# Patient Record
Sex: Male | Born: 1940 | Race: Black or African American | Hispanic: No | Marital: Married | Smoking: Current every day smoker
Health system: Southern US, Community
[De-identification: ages and names within clinical notes are randomized; demographics above are authoritative.]

## PROBLEM LIST (undated history)

## (undated) DIAGNOSIS — I10 Essential (primary) hypertension: Secondary | ICD-10-CM

## (undated) DIAGNOSIS — E854 Organ-limited amyloidosis: Secondary | ICD-10-CM

## (undated) DIAGNOSIS — Z720 Tobacco use: Secondary | ICD-10-CM

## (undated) DIAGNOSIS — N08 Glomerular disorders in diseases classified elsewhere: Secondary | ICD-10-CM

## (undated) DIAGNOSIS — K08109 Complete loss of teeth, unspecified cause, unspecified class: Secondary | ICD-10-CM

## (undated) DIAGNOSIS — I861 Scrotal varices: Secondary | ICD-10-CM

## (undated) DIAGNOSIS — N189 Chronic kidney disease, unspecified: Secondary | ICD-10-CM

## (undated) DIAGNOSIS — N390 Urinary tract infection, site not specified: Secondary | ICD-10-CM

## (undated) DIAGNOSIS — M1990 Unspecified osteoarthritis, unspecified site: Secondary | ICD-10-CM

## (undated) DIAGNOSIS — B952 Enterococcus as the cause of diseases classified elsewhere: Secondary | ICD-10-CM

## (undated) DIAGNOSIS — Z972 Presence of dental prosthetic device (complete) (partial): Secondary | ICD-10-CM

## (undated) DIAGNOSIS — R7989 Other specified abnormal findings of blood chemistry: Secondary | ICD-10-CM

## (undated) DIAGNOSIS — K4020 Bilateral inguinal hernia, without obstruction or gangrene, not specified as recurrent: Secondary | ICD-10-CM

## (undated) HISTORY — DX: Essential (primary) hypertension: I10

## (undated) HISTORY — PX: MULTIPLE TOOTH EXTRACTIONS: SHX2053

## (undated) HISTORY — DX: Chronic kidney disease, unspecified: N18.9

## (undated) HISTORY — DX: Scrotal varices: I86.1

## (undated) HISTORY — DX: Tobacco use: Z72.0

## (undated) HISTORY — PX: COLONOSCOPY: SHX174

---

## 2007-09-11 ENCOUNTER — Emergency Department (HOSPITAL_COMMUNITY): Admission: EM | Admit: 2007-09-11 | Discharge: 2007-09-11 | Payer: Self-pay | Admitting: Family Medicine

## 2007-10-18 ENCOUNTER — Emergency Department (HOSPITAL_COMMUNITY): Admission: EM | Admit: 2007-10-18 | Discharge: 2007-10-18 | Payer: Self-pay | Admitting: Family Medicine

## 2010-02-28 ENCOUNTER — Encounter: Payer: Self-pay | Admitting: Nephrology

## 2010-02-28 ENCOUNTER — Observation Stay (HOSPITAL_COMMUNITY): Admission: AD | Admit: 2010-02-28 | Discharge: 2010-03-01 | Payer: Self-pay | Admitting: Nephrology

## 2010-03-03 ENCOUNTER — Ambulatory Visit: Payer: Self-pay | Admitting: Oncology

## 2010-03-15 LAB — CBC WITH DIFFERENTIAL/PLATELET
BASO%: 0.1 % (ref 0.0–2.0)
Basophils Absolute: 0 10*3/uL (ref 0.0–0.1)
EOS%: 5.9 % (ref 0.0–7.0)
HCT: 38.6 % (ref 38.4–49.9)
HGB: 13.1 g/dL (ref 13.0–17.1)
MCH: 33.4 pg (ref 27.2–33.4)
MCHC: 33.9 g/dL (ref 32.0–36.0)
MCV: 98.6 fL — ABNORMAL HIGH (ref 79.3–98.0)
MONO%: 9 % (ref 0.0–14.0)
NEUT%: 63.9 % (ref 39.0–75.0)
lymph#: 1.2 10*3/uL (ref 0.9–3.3)

## 2010-03-17 ENCOUNTER — Ambulatory Visit (HOSPITAL_COMMUNITY): Admission: RE | Admit: 2010-03-17 | Discharge: 2010-03-17 | Payer: Self-pay | Admitting: Oncology

## 2010-03-17 LAB — SPEP & IFE WITH QIG
Alpha-2-Globulin: 20.1 % — ABNORMAL HIGH (ref 7.1–11.8)
Gamma Globulin: 12.7 % (ref 11.1–18.8)
IgA: 425 mg/dL — ABNORMAL HIGH (ref 68–378)
IgG (Immunoglobin G), Serum: 524 mg/dL — ABNORMAL LOW (ref 694–1618)
IgM, Serum: 91 mg/dL (ref 60–263)
Total Protein, Serum Electrophoresis: 4.5 g/dL — ABNORMAL LOW (ref 6.0–8.3)

## 2010-03-17 LAB — COMPREHENSIVE METABOLIC PANEL
ALT: <8 U/L (ref 0–53)
AST: 20 U/L (ref 0–37)
Alkaline Phosphatase: 52 U/L (ref 39–117)
BUN: 21 mg/dL (ref 6–23)
Calcium: 8.5 mg/dL (ref 8.4–10.5)
Chloride: 110 mEq/L (ref 96–112)
Creatinine, Ser: 2.78 mg/dL — ABNORMAL HIGH (ref 0.40–1.50)
Total Bilirubin: 0.4 mg/dL (ref 0.3–1.2)

## 2010-03-17 LAB — KAPPA/LAMBDA LIGHT CHAINS: Kappa:Lambda Ratio: 0.1 — ABNORMAL LOW (ref 0.26–1.65)

## 2010-03-23 ENCOUNTER — Ambulatory Visit (HOSPITAL_COMMUNITY): Admission: RE | Admit: 2010-03-23 | Discharge: 2010-03-23 | Payer: Self-pay | Admitting: Oncology

## 2010-03-23 ENCOUNTER — Ambulatory Visit: Payer: Self-pay | Admitting: Oncology

## 2010-03-24 ENCOUNTER — Encounter: Payer: Self-pay | Admitting: Oncology

## 2010-03-24 ENCOUNTER — Ambulatory Visit (HOSPITAL_COMMUNITY): Admission: RE | Admit: 2010-03-24 | Discharge: 2010-03-24 | Payer: Self-pay | Admitting: Oncology

## 2010-04-06 ENCOUNTER — Ambulatory Visit: Payer: Self-pay | Admitting: Oncology

## 2010-04-15 LAB — CBC WITH DIFFERENTIAL/PLATELET
EOS%: 0 % (ref 0.0–7.0)
Eosinophils Absolute: 0 10*3/uL (ref 0.0–0.5)
HCT: 41.5 % (ref 38.4–49.9)
HGB: 13.7 g/dL (ref 13.0–17.1)
LYMPH%: 10 % — ABNORMAL LOW (ref 14.0–49.0)
MCH: 32.4 pg (ref 27.2–33.4)
MCHC: 33 g/dL (ref 32.0–36.0)
MONO#: 0 10*3/uL — ABNORMAL LOW (ref 0.1–0.9)
MONO%: 0.5 % (ref 0.0–14.0)
NEUT#: 5.6 10*3/uL (ref 1.5–6.5)
NEUT%: 89.2 % — ABNORMAL HIGH (ref 39.0–75.0)
Platelets: 218 10*3/uL (ref 140–400)
RBC: 4.23 10*6/uL (ref 4.20–5.82)
lymph#: 0.6 10*3/uL — ABNORMAL LOW (ref 0.9–3.3)

## 2010-04-18 LAB — CBC WITH DIFFERENTIAL/PLATELET
Basophils Absolute: 0 10*3/uL (ref 0.0–0.1)
LYMPH%: 4.5 % — ABNORMAL LOW (ref 14.0–49.0)
MCHC: 33.1 g/dL (ref 32.0–36.0)
MCV: 98.2 fL — ABNORMAL HIGH (ref 79.3–98.0)
MONO%: 1.5 % (ref 0.0–14.0)
NEUT#: 11.5 10*3/uL — ABNORMAL HIGH (ref 1.5–6.5)
NEUT%: 93.9 % — ABNORMAL HIGH (ref 39.0–75.0)
Platelets: 217 10*3/uL (ref 140–400)
WBC: 12.2 10*3/uL — ABNORMAL HIGH (ref 4.0–10.3)
lymph#: 0.6 10*3/uL — ABNORMAL LOW (ref 0.9–3.3)

## 2010-04-19 LAB — COMPREHENSIVE METABOLIC PANEL
AST: 21 U/L (ref 0–37)
Albumin: 2.1 g/dL — ABNORMAL LOW (ref 3.5–5.2)
Alkaline Phosphatase: 63 U/L (ref 39–117)
Creatinine, Ser: 3.24 mg/dL — ABNORMAL HIGH (ref 0.40–1.50)
Sodium: 143 mEq/L (ref 135–145)
Total Protein: 5.1 g/dL — ABNORMAL LOW (ref 6.0–8.3)

## 2010-04-19 LAB — SPEP & IFE WITH QIG
Albumin ELP: 38.7 % — ABNORMAL LOW (ref 55.8–66.1)
Alpha-1-Globulin: 7.1 % — ABNORMAL HIGH (ref 2.9–4.9)
Alpha-2-Globulin: 21.6 % — ABNORMAL HIGH (ref 7.1–11.8)
Beta 2: 6.5 % (ref 3.2–6.5)
IgG (Immunoglobin G), Serum: 660 mg/dL — ABNORMAL LOW (ref 694–1618)
IgM, Serum: 106 mg/dL (ref 60–263)
M-Spike, %: 0.29 g/dL

## 2010-04-19 LAB — KAPPA/LAMBDA LIGHT CHAINS: Kappa free light chain: 2.19 mg/dL — ABNORMAL HIGH (ref 0.33–1.94)

## 2010-04-22 LAB — COMPREHENSIVE METABOLIC PANEL
ALT: 10 U/L (ref 0–53)
AST: 21 U/L (ref 0–37)
BUN: 49 mg/dL — ABNORMAL HIGH (ref 6–23)
Calcium: 7.9 mg/dL — ABNORMAL LOW (ref 8.4–10.5)
Creatinine, Ser: 4.44 mg/dL — ABNORMAL HIGH (ref 0.40–1.50)
Potassium: 4.2 mEq/L (ref 3.5–5.3)
Total Bilirubin: 0.7 mg/dL (ref 0.3–1.2)

## 2010-04-22 LAB — CBC WITH DIFFERENTIAL/PLATELET
BASO%: 0.1 % (ref 0.0–2.0)
Basophils Absolute: 0 10*3/uL (ref 0.0–0.1)
Eosinophils Absolute: 0.1 10*3/uL (ref 0.0–0.5)
HGB: 13.8 g/dL (ref 13.0–17.1)
LYMPH%: 10.5 % — ABNORMAL LOW (ref 14.0–49.0)
MCHC: 33.6 g/dL (ref 32.0–36.0)
MCV: 95.8 fL (ref 79.3–98.0)
MONO#: 0.9 10*3/uL (ref 0.1–0.9)
NEUT#: 9 10*3/uL — ABNORMAL HIGH (ref 1.5–6.5)
RBC: 4.29 10*6/uL (ref 4.20–5.82)
WBC: 11.2 10*3/uL — ABNORMAL HIGH (ref 4.0–10.3)

## 2010-04-25 LAB — CBC WITH DIFFERENTIAL/PLATELET
BASO%: 0.3 % (ref 0.0–2.0)
HCT: 37.7 % — ABNORMAL LOW (ref 38.4–49.9)
MCH: 32.3 pg (ref 27.2–33.4)
MCV: 97.4 fL (ref 79.3–98.0)
MONO%: 10.7 % (ref 0.0–14.0)
NEUT#: 7.1 10*3/uL — ABNORMAL HIGH (ref 1.5–6.5)
Platelets: 80 10*3/uL — ABNORMAL LOW (ref 140–400)
lymph#: 1.5 10*3/uL (ref 0.9–3.3)

## 2010-05-05 LAB — CBC WITH DIFFERENTIAL/PLATELET
Basophils Absolute: 0.1 10*3/uL (ref 0.0–0.1)
Eosinophils Absolute: 0.3 10*3/uL (ref 0.0–0.5)
HCT: 30.7 % — ABNORMAL LOW (ref 38.4–49.9)
HGB: 10.5 g/dL — ABNORMAL LOW (ref 13.0–17.1)
LYMPH%: 11.8 % — ABNORMAL LOW (ref 14.0–49.0)
MCV: 99.4 fL — ABNORMAL HIGH (ref 79.3–98.0)
MONO%: 10.4 % (ref 0.0–14.0)
NEUT#: 5 10*3/uL (ref 1.5–6.5)
NEUT%: 73 % (ref 39.0–75.0)
Platelets: 236 10*3/uL (ref 140–400)

## 2010-05-05 LAB — COMPREHENSIVE METABOLIC PANEL
Alkaline Phosphatase: 47 U/L (ref 39–117)
BUN: 20 mg/dL (ref 6–23)
CO2: 28 mEq/L (ref 19–32)
Glucose, Bld: 101 mg/dL — ABNORMAL HIGH (ref 70–99)
Total Bilirubin: 0.5 mg/dL (ref 0.3–1.2)

## 2010-05-06 ENCOUNTER — Ambulatory Visit: Payer: Self-pay | Admitting: Oncology

## 2010-05-09 LAB — KAPPA/LAMBDA LIGHT CHAINS
Kappa free light chain: 4.42 mg/dL — ABNORMAL HIGH (ref 0.33–1.94)
Kappa:Lambda Ratio: 1.03 (ref 0.26–1.65)
Lambda Free Lght Chn: 4.3 mg/dL — ABNORMAL HIGH (ref 0.57–2.63)

## 2010-05-09 LAB — CBC WITH DIFFERENTIAL/PLATELET
Basophils Absolute: 0 10*3/uL (ref 0.0–0.1)
EOS%: 0.2 % (ref 0.0–7.0)
HGB: 11.4 g/dL — ABNORMAL LOW (ref 13.0–17.1)
LYMPH%: 5.7 % — ABNORMAL LOW (ref 14.0–49.0)
MCV: 100 fL — ABNORMAL HIGH (ref 79.3–98.0)
MONO%: 2.3 % (ref 0.0–14.0)
NEUT#: 11.3 10*3/uL — ABNORMAL HIGH (ref 1.5–6.5)
Platelets: 188 10*3/uL (ref 140–400)
RBC: 3.5 10*6/uL — ABNORMAL LOW (ref 4.20–5.82)
RDW: 13.7 % (ref 11.0–14.6)

## 2010-05-09 LAB — SPEP & IFE WITH QIG
Alpha-1-Globulin: 10.2 % — ABNORMAL HIGH (ref 2.9–4.9)
Beta 2: 8.7 % — ABNORMAL HIGH (ref 3.2–6.5)
Beta Globulin: 6.2 % (ref 4.7–7.2)
Gamma Globulin: 15.5 % (ref 11.1–18.8)
IgA: 230 mg/dL (ref 68–378)
IgM, Serum: 64 mg/dL (ref 60–263)

## 2010-05-09 LAB — COMPREHENSIVE METABOLIC PANEL
AST: 14 U/L (ref 0–37)
Calcium: 8.5 mg/dL (ref 8.4–10.5)
Creatinine, Ser: 3.76 mg/dL — ABNORMAL HIGH (ref 0.40–1.50)
Potassium: 4.5 mEq/L (ref 3.5–5.3)
Sodium: 141 mEq/L (ref 135–145)

## 2010-05-16 ENCOUNTER — Encounter: Admission: RE | Admit: 2010-05-16 | Discharge: 2010-05-16 | Payer: Self-pay | Admitting: Family Medicine

## 2010-05-16 LAB — CBC WITH DIFFERENTIAL/PLATELET
BASO%: 0.2 % (ref 0.0–2.0)
Basophils Absolute: 0 10*3/uL (ref 0.0–0.1)
EOS%: 1.9 % (ref 0.0–7.0)
Eosinophils Absolute: 0.2 10*3/uL (ref 0.0–0.5)
HCT: 36.2 % — ABNORMAL LOW (ref 38.4–49.9)
LYMPH%: 18.7 % (ref 14.0–49.0)
MCH: 32.5 pg (ref 27.2–33.4)
MCHC: 32.9 g/dL (ref 32.0–36.0)
MCV: 98.9 fL — ABNORMAL HIGH (ref 79.3–98.0)
MONO#: 1.4 10*3/uL — ABNORMAL HIGH (ref 0.1–0.9)
NEUT%: 66.6 % (ref 39.0–75.0)
RBC: 3.66 10*6/uL — ABNORMAL LOW (ref 4.20–5.82)
WBC: 10.9 10*3/uL — ABNORMAL HIGH (ref 4.0–10.3)
lymph#: 2 10*3/uL (ref 0.9–3.3)

## 2010-05-16 LAB — TECHNOLOGIST REVIEW

## 2010-05-18 LAB — SPEP & IFE WITH QIG
Albumin ELP: 40.7 % — ABNORMAL LOW (ref 55.8–66.1)
Alpha-1-Globulin: 12.4 % — ABNORMAL HIGH (ref 2.9–4.9)
IgA: 182 mg/dL (ref 68–378)
Total Protein, Serum Electrophoresis: 4.6 g/dL — ABNORMAL LOW (ref 6.0–8.3)

## 2010-05-18 LAB — COMPREHENSIVE METABOLIC PANEL
AST: 20 U/L (ref 0–37)
Albumin: 2.3 g/dL — ABNORMAL LOW (ref 3.5–5.2)
Chloride: 112 mEq/L (ref 96–112)
Total Bilirubin: 0.5 mg/dL (ref 0.3–1.2)

## 2010-05-18 LAB — KAPPA/LAMBDA LIGHT CHAINS
Kappa free light chain: 2.42 mg/dL — ABNORMAL HIGH (ref 0.33–1.94)
Kappa:Lambda Ratio: 1.07 (ref 0.26–1.65)

## 2010-05-20 ENCOUNTER — Emergency Department (HOSPITAL_COMMUNITY)
Admission: EM | Admit: 2010-05-20 | Discharge: 2010-05-20 | Payer: Self-pay | Source: Home / Self Care | Admitting: Emergency Medicine

## 2010-05-27 LAB — CBC WITH DIFFERENTIAL/PLATELET
EOS%: 2.8 % (ref 0.0–7.0)
LYMPH%: 29.2 % (ref 14.0–49.0)
MCH: 32.2 pg (ref 27.2–33.4)
MCHC: 32 g/dL (ref 32.0–36.0)
MCV: 100.6 fL — ABNORMAL HIGH (ref 79.3–98.0)
MONO#: 0.7 10*3/uL (ref 0.1–0.9)
MONO%: 9.9 % (ref 0.0–14.0)
NEUT#: 4 10*3/uL (ref 1.5–6.5)
Platelets: 233 10*3/uL (ref 140–400)
RDW: 14.6 % (ref 11.0–14.6)
WBC: 7.1 10*3/uL (ref 4.0–10.3)

## 2010-05-27 LAB — COMPREHENSIVE METABOLIC PANEL
AST: 16 U/L (ref 0–37)
Albumin: 2.3 g/dL — ABNORMAL LOW (ref 3.5–5.2)
Alkaline Phosphatase: 54 U/L (ref 39–117)
CO2: 25 mEq/L (ref 19–32)
Calcium: 8.7 mg/dL (ref 8.4–10.5)
Chloride: 112 mEq/L (ref 96–112)
Creatinine, Ser: 2.98 mg/dL — ABNORMAL HIGH (ref 0.40–1.50)
Glucose, Bld: 85 mg/dL (ref 70–99)
Sodium: 145 mEq/L (ref 135–145)
Total Bilirubin: 0.5 mg/dL (ref 0.3–1.2)

## 2010-05-30 LAB — CBC WITH DIFFERENTIAL/PLATELET
Eosinophils Absolute: 0.2 10*3/uL (ref 0.0–0.5)
HCT: 34.1 % — ABNORMAL LOW (ref 38.4–49.9)
LYMPH%: 31 % (ref 14.0–49.0)
MCH: 32.4 pg (ref 27.2–33.4)
MCHC: 32.8 g/dL (ref 32.0–36.0)
MCV: 98.6 fL — ABNORMAL HIGH (ref 79.3–98.0)
MONO#: 0.8 10*3/uL (ref 0.1–0.9)
NEUT%: 55.1 % (ref 39.0–75.0)
WBC: 7.4 10*3/uL (ref 4.0–10.3)
lymph#: 2.3 10*3/uL (ref 0.9–3.3)

## 2010-05-30 LAB — COMPREHENSIVE METABOLIC PANEL
ALT: 9 U/L (ref 0–53)
Albumin: 2 g/dL — ABNORMAL LOW (ref 3.5–5.2)
Alkaline Phosphatase: 54 U/L (ref 39–117)
BUN: 17 mg/dL (ref 6–23)
CO2: 28 mEq/L (ref 19–32)
Chloride: 110 mEq/L (ref 96–112)
Creatinine, Ser: 3.15 mg/dL — ABNORMAL HIGH (ref 0.40–1.50)
Potassium: 3.8 mEq/L (ref 3.5–5.3)
Sodium: 143 mEq/L (ref 135–145)

## 2010-06-01 LAB — SPEP & IFE WITH QIG
Albumin ELP: 45.3 % — ABNORMAL LOW (ref 55.8–66.1)
Alpha-1-Globulin: 8.9 % — ABNORMAL HIGH (ref 2.9–4.9)
Alpha-2-Globulin: 19.9 % — ABNORMAL HIGH (ref 7.1–11.8)
Beta Globulin: 6.5 % (ref 4.7–7.2)
Gamma Globulin: 11.8 % (ref 11.1–18.8)
IgA: 119 mg/dL (ref 68–378)
IgM, Serum: 57 mg/dL — ABNORMAL LOW (ref 60–263)

## 2010-06-03 LAB — CBC WITH DIFFERENTIAL/PLATELET
BASO%: 0.1 % (ref 0.0–2.0)
EOS%: 0 % (ref 0.0–7.0)
Eosinophils Absolute: 0 10*3/uL (ref 0.0–0.5)
MCHC: 32.6 g/dL (ref 32.0–36.0)
MONO%: 6.2 % (ref 0.0–14.0)
NEUT#: 7.7 10*3/uL — ABNORMAL HIGH (ref 1.5–6.5)
Platelets: 174 10*3/uL (ref 140–400)

## 2010-06-03 LAB — COMPREHENSIVE METABOLIC PANEL
Albumin: 2.2 g/dL — ABNORMAL LOW (ref 3.5–5.2)
Alkaline Phosphatase: 46 U/L (ref 39–117)
CO2: 27 mEq/L (ref 19–32)
Creatinine, Ser: 4.5 mg/dL — ABNORMAL HIGH (ref 0.40–1.50)
Glucose, Bld: 92 mg/dL (ref 70–99)
Sodium: 141 mEq/L (ref 135–145)
Total Bilirubin: 1 mg/dL (ref 0.3–1.2)
Total Protein: 5.1 g/dL — ABNORMAL LOW (ref 6.0–8.3)

## 2010-06-06 ENCOUNTER — Ambulatory Visit: Payer: Self-pay | Admitting: Oncology

## 2010-06-17 LAB — CBC WITH DIFFERENTIAL/PLATELET
EOS%: 1.9 % (ref 0.0–7.0)
Eosinophils Absolute: 0.1 10*3/uL (ref 0.0–0.5)
HGB: 12.1 g/dL — ABNORMAL LOW (ref 13.0–17.1)
MCH: 31.9 pg (ref 27.2–33.4)
MCV: 98.7 fL — ABNORMAL HIGH (ref 79.3–98.0)
MONO%: 11.3 % (ref 0.0–14.0)
RBC: 3.79 10*6/uL — ABNORMAL LOW (ref 4.20–5.82)
RDW: 14.1 % (ref 11.0–14.6)
WBC: 7.4 10*3/uL (ref 4.0–10.3)

## 2010-06-20 LAB — CBC WITH DIFFERENTIAL/PLATELET
BASO%: 0.5 % (ref 0.0–2.0)
Basophils Absolute: 0 10*3/uL (ref 0.0–0.1)
Eosinophils Absolute: 0.1 10*3/uL (ref 0.0–0.5)
HCT: 38 % — ABNORMAL LOW (ref 38.4–49.9)
MCV: 98.4 fL — ABNORMAL HIGH (ref 79.3–98.0)
MONO#: 0.8 10*3/uL (ref 0.1–0.9)
Platelets: 191 10*3/uL (ref 140–400)
RBC: 3.86 10*6/uL — ABNORMAL LOW (ref 4.20–5.82)
RDW: 13.8 % (ref 11.0–14.6)
WBC: 6.6 10*3/uL (ref 4.0–10.3)
lymph#: 1.7 10*3/uL (ref 0.9–3.3)

## 2010-06-20 LAB — COMPREHENSIVE METABOLIC PANEL
AST: 16 U/L (ref 0–37)
Alkaline Phosphatase: 52 U/L (ref 39–117)
CO2: 31 mEq/L (ref 19–32)
Calcium: 9 mg/dL (ref 8.4–10.5)
Chloride: 104 mEq/L (ref 96–112)
Glucose, Bld: 84 mg/dL (ref 70–99)
Total Bilirubin: 0.5 mg/dL (ref 0.3–1.2)

## 2010-06-21 LAB — COMPREHENSIVE METABOLIC PANEL
AST: 20 U/L (ref 0–37)
Albumin: 2.4 g/dL — ABNORMAL LOW (ref 3.5–5.2)
Alkaline Phosphatase: 52 U/L (ref 39–117)
BUN: 14 mg/dL (ref 6–23)
Calcium: 9 mg/dL (ref 8.4–10.5)
Chloride: 106 mEq/L (ref 96–112)
Potassium: 4 mEq/L (ref 3.5–5.3)
Sodium: 143 mEq/L (ref 135–145)
Total Bilirubin: 0.7 mg/dL (ref 0.3–1.2)

## 2010-06-21 LAB — SPEP & IFE WITH QIG
Beta 2: 6.4 % (ref 3.2–6.5)
IgG (Immunoglobin G), Serum: 529 mg/dL — ABNORMAL LOW (ref 694–1618)
SPE Interp.: 8
Total Protein, Serum Electrophoresis: 5.3 g/dL — ABNORMAL LOW (ref 6.0–8.3)

## 2010-06-21 LAB — KAPPA/LAMBDA LIGHT CHAINS
Kappa:Lambda Ratio: 0.84 (ref 0.26–1.65)
Lambda Free Lght Chn: 2.35 mg/dL (ref 0.57–2.63)

## 2010-06-24 LAB — CBC WITH DIFFERENTIAL/PLATELET
Basophils Absolute: 0 10*3/uL (ref 0.0–0.1)
HCT: 37.3 % — ABNORMAL LOW (ref 38.4–49.9)
MCH: 32.3 pg (ref 27.2–33.4)
MCHC: 33.2 g/dL (ref 32.0–36.0)
MONO#: 0.7 10*3/uL (ref 0.1–0.9)
MONO%: 9.1 % (ref 0.0–14.0)
NEUT#: 4.8 10*3/uL (ref 1.5–6.5)
Platelets: 144 10*3/uL (ref 140–400)
RBC: 3.84 10*6/uL — ABNORMAL LOW (ref 4.20–5.82)

## 2010-06-24 LAB — COMPREHENSIVE METABOLIC PANEL
ALT: <8 U/L (ref 0–53)
AST: 11 U/L (ref 0–37)
Alkaline Phosphatase: 45 U/L (ref 39–117)
BUN: 22 mg/dL (ref 6–23)
Chloride: 105 mEq/L (ref 96–112)
Glucose, Bld: 109 mg/dL — ABNORMAL HIGH (ref 70–99)
Potassium: 3.9 mEq/L (ref 3.5–5.3)
Sodium: 140 mEq/L (ref 135–145)
Total Protein: 4.1 g/dL — ABNORMAL LOW (ref 6.0–8.3)

## 2010-06-30 ENCOUNTER — Emergency Department (HOSPITAL_COMMUNITY)
Admission: EM | Admit: 2010-06-30 | Discharge: 2010-06-30 | Payer: Self-pay | Source: Home / Self Care | Admitting: Emergency Medicine

## 2010-07-06 ENCOUNTER — Ambulatory Visit: Payer: Self-pay | Admitting: Oncology

## 2010-07-08 LAB — CBC WITH DIFFERENTIAL/PLATELET
BASO%: 0.4 % (ref 0.0–2.0)
Basophils Absolute: 0 10*3/uL (ref 0.0–0.1)
HCT: 33.9 % — ABNORMAL LOW (ref 38.4–49.9)
MCH: 32.2 pg (ref 27.2–33.4)
MCHC: 32.2 g/dL (ref 32.0–36.0)
MONO#: 0.9 10*3/uL (ref 0.1–0.9)
NEUT#: 6.5 10*3/uL (ref 1.5–6.5)
NEUT%: 68 % (ref 39.0–75.0)

## 2010-07-08 LAB — TECHNOLOGIST REVIEW

## 2010-07-11 LAB — COMPREHENSIVE METABOLIC PANEL
AST: 15 U/L (ref 0–37)
BUN: 23 mg/dL (ref 6–23)
Chloride: 108 mEq/L (ref 96–112)
Creatinine, Ser: 2.73 mg/dL — ABNORMAL HIGH (ref 0.40–1.50)
Potassium: 4.4 mEq/L (ref 3.5–5.3)
Total Bilirubin: 0.5 mg/dL (ref 0.3–1.2)
Total Protein: 4.3 g/dL — ABNORMAL LOW (ref 6.0–8.3)

## 2010-07-11 LAB — CBC WITH DIFFERENTIAL/PLATELET
HGB: 11 g/dL — ABNORMAL LOW (ref 13.0–17.1)
LYMPH%: 18.3 % (ref 14.0–49.0)
NEUT#: 5.4 10*3/uL (ref 1.5–6.5)
Platelets: 307 10*3/uL (ref 140–400)
RBC: 3.43 10*6/uL — ABNORMAL LOW (ref 4.20–5.82)
WBC: 8.7 10*3/uL (ref 4.0–10.3)

## 2010-07-12 LAB — SPEP & IFE WITH QIG
Albumin ELP: 53 % — ABNORMAL LOW (ref 55.8–66.1)
Alpha-1-Globulin: 6.5 % — ABNORMAL HIGH (ref 2.9–4.9)
Alpha-2-Globulin: 17.6 % — ABNORMAL HIGH (ref 7.1–11.8)
IgG (Immunoglobin G), Serum: 484 mg/dL — ABNORMAL LOW (ref 694–1618)
Total Protein, Serum Electrophoresis: 4.4 g/dL — ABNORMAL LOW (ref 6.0–8.3)

## 2010-07-12 LAB — COMPREHENSIVE METABOLIC PANEL
Alkaline Phosphatase: 38 U/L — ABNORMAL LOW (ref 39–117)
CO2: 26 mEq/L (ref 19–32)
Calcium: 8.9 mg/dL (ref 8.4–10.5)
Chloride: 110 mEq/L (ref 96–112)
Creatinine, Ser: 2.71 mg/dL — ABNORMAL HIGH (ref 0.40–1.50)
Glucose, Bld: 85 mg/dL (ref 70–99)
Sodium: 144 mEq/L (ref 135–145)
Total Bilirubin: 0.4 mg/dL (ref 0.3–1.2)
Total Protein: 4.4 g/dL — ABNORMAL LOW (ref 6.0–8.3)

## 2010-07-12 LAB — KAPPA/LAMBDA LIGHT CHAINS
Kappa free light chain: 2.36 mg/dL — ABNORMAL HIGH (ref 0.33–1.94)
Kappa:Lambda Ratio: 0.94 (ref 0.26–1.65)
Lambda Free Lght Chn: 2.5 mg/dL (ref 0.57–2.63)

## 2010-07-18 LAB — CBC WITH DIFFERENTIAL/PLATELET
BASO%: 0.3 % (ref 0.0–2.0)
EOS%: 7.7 % — ABNORMAL HIGH (ref 0.0–7.0)
LYMPH%: 24 % (ref 14.0–49.0)
MONO#: 0.7 10*3/uL (ref 0.1–0.9)
MONO%: 12.4 % (ref 0.0–14.0)
RBC: 3.49 10*6/uL — ABNORMAL LOW (ref 4.20–5.82)
RDW: 14.1 % (ref 11.0–14.6)
WBC: 5.7 10*3/uL (ref 4.0–10.3)

## 2010-07-22 ENCOUNTER — Ambulatory Visit: Payer: Self-pay | Admitting: Vascular Surgery

## 2010-07-29 LAB — CBC WITH DIFFERENTIAL/PLATELET
BASO%: 1 % (ref 0.0–2.0)
EOS%: 5 % (ref 0.0–7.0)
Eosinophils Absolute: 0.3 10*3/uL (ref 0.0–0.5)
HCT: 38.3 % — ABNORMAL LOW (ref 38.4–49.9)
HGB: 12.5 g/dL — ABNORMAL LOW (ref 13.0–17.1)
MCH: 32.1 pg (ref 27.2–33.4)
MCHC: 32.6 g/dL (ref 32.0–36.0)
MCV: 98.5 fL — ABNORMAL HIGH (ref 79.3–98.0)
MONO#: 0.8 10*3/uL (ref 0.1–0.9)
NEUT#: 3.6 10*3/uL (ref 1.5–6.5)
NEUT%: 56.8 % (ref 39.0–75.0)
Platelets: 256 10*3/uL (ref 140–400)
RBC: 3.89 10*6/uL — ABNORMAL LOW (ref 4.20–5.82)
RDW: 14.3 % (ref 11.0–14.6)
WBC: 6.2 10*3/uL (ref 4.0–10.3)

## 2010-08-01 LAB — COMPREHENSIVE METABOLIC PANEL
ALT: 8 U/L (ref 0–53)
AST: 21 U/L (ref 0–37)
Alkaline Phosphatase: 44 U/L (ref 39–117)
Sodium: 141 mEq/L (ref 135–145)
Total Bilirubin: 0.6 mg/dL (ref 0.3–1.2)

## 2010-08-01 LAB — CBC WITH DIFFERENTIAL/PLATELET
BASO%: 0.9 % (ref 0.0–2.0)
EOS%: 4.9 % (ref 0.0–7.0)
Eosinophils Absolute: 0.3 10*3/uL (ref 0.0–0.5)
HCT: 37.3 % — ABNORMAL LOW (ref 38.4–49.9)
LYMPH%: 25.2 % (ref 14.0–49.0)
MCH: 32.8 pg (ref 27.2–33.4)
MCHC: 33.8 g/dL (ref 32.0–36.0)
NEUT#: 3.2 10*3/uL (ref 1.5–6.5)
NEUT%: 56.7 % (ref 39.0–75.0)
Platelets: 206 10*3/uL (ref 140–400)
RDW: 14 % (ref 11.0–14.6)
WBC: 5.7 10*3/uL (ref 4.0–10.3)
lymph#: 1.4 10*3/uL (ref 0.9–3.3)
nRBC: 0 % (ref 0–0)

## 2010-08-02 LAB — SPEP & IFE WITH QIG
Albumin ELP: 52.7 % — ABNORMAL LOW (ref 55.8–66.1)
Alpha-2-Globulin: 18.3 % — ABNORMAL HIGH (ref 7.1–11.8)
IgA: 157 mg/dL (ref 68–378)

## 2010-08-02 LAB — KAPPA/LAMBDA LIGHT CHAINS: Lambda Free Lght Chn: 2.05 mg/dL (ref 0.57–2.63)

## 2010-08-02 LAB — COMPREHENSIVE METABOLIC PANEL
Alkaline Phosphatase: 43 U/L (ref 39–117)
Chloride: 112 mEq/L (ref 96–112)
Sodium: 144 mEq/L (ref 135–145)

## 2010-08-08 ENCOUNTER — Other Ambulatory Visit: Payer: Self-pay | Admitting: Oncology

## 2010-08-08 ENCOUNTER — Ambulatory Visit: Payer: Self-pay | Admitting: Oncology

## 2010-08-08 LAB — COMPREHENSIVE METABOLIC PANEL
AST: 16 U/L (ref 0–37)
Albumin: 2.4 g/dL — ABNORMAL LOW (ref 3.5–5.2)
Alkaline Phosphatase: 40 U/L (ref 39–117)
BUN: 29 mg/dL — ABNORMAL HIGH (ref 6–23)
Calcium: 8.8 mg/dL (ref 8.4–10.5)
Chloride: 106 mEq/L (ref 96–112)
Creatinine, Ser: 2.97 mg/dL — ABNORMAL HIGH (ref 0.40–1.50)
Sodium: 142 mEq/L (ref 135–145)
Total Bilirubin: 0.6 mg/dL (ref 0.3–1.2)
Total Protein: 4.3 g/dL — ABNORMAL LOW (ref 6.0–8.3)

## 2010-08-08 LAB — CBC WITH DIFFERENTIAL/PLATELET
LYMPH%: 18.6 % (ref 14.0–49.0)
MCH: 32.3 pg (ref 27.2–33.4)
MCHC: 33.8 g/dL (ref 32.0–36.0)
MONO%: 11.6 % (ref 0.0–14.0)
Platelets: 146 10*3/uL (ref 140–400)
RBC: 3.72 10*6/uL — ABNORMAL LOW (ref 4.20–5.82)
nRBC: 0 % (ref 0–0)

## 2010-08-15 ENCOUNTER — Ambulatory Visit (HOSPITAL_COMMUNITY): Admission: RE | Admit: 2010-08-15 | Discharge: 2010-08-15 | Payer: Self-pay | Admitting: Oncology

## 2010-08-18 LAB — CBC WITH DIFFERENTIAL/PLATELET
BASO%: 0.6 % (ref 0.0–2.0)
LYMPH%: 14 % (ref 14.0–49.0)
MCHC: 33.2 g/dL (ref 32.0–36.0)
MONO#: 0.8 10*3/uL (ref 0.1–0.9)
MONO%: 11.4 % (ref 0.0–14.0)
NEUT#: 4.7 10*3/uL (ref 1.5–6.5)
Platelets: 235 10*3/uL (ref 140–400)
RBC: 3.47 10*6/uL — ABNORMAL LOW (ref 4.20–5.82)
RDW: 14.8 % — ABNORMAL HIGH (ref 11.0–14.6)
WBC: 6.8 10*3/uL (ref 4.0–10.3)

## 2010-08-22 LAB — COMPREHENSIVE METABOLIC PANEL
ALT: 9 U/L (ref 0–53)
Albumin: 2.4 g/dL — ABNORMAL LOW (ref 3.5–5.2)
Alkaline Phosphatase: 40 U/L (ref 39–117)
CO2: 29 mEq/L (ref 19–32)
Potassium: 4.4 mEq/L (ref 3.5–5.3)
Sodium: 143 mEq/L (ref 135–145)
Total Bilirubin: 0.5 mg/dL (ref 0.3–1.2)
Total Protein: 4.4 g/dL — ABNORMAL LOW (ref 6.0–8.3)

## 2010-08-22 LAB — SPEP & IFE WITH QIG
Albumin ELP: 52.7 % — ABNORMAL LOW (ref 55.8–66.1)
Alpha-2-Globulin: 17.7 % — ABNORMAL HIGH (ref 7.1–11.8)
Beta 2: 6 % (ref 3.2–6.5)
Beta Globulin: 6.6 % (ref 4.7–7.2)
IgG (Immunoglobin G), Serum: 438 mg/dL — ABNORMAL LOW (ref 694–1618)
Total Protein, Serum Electrophoresis: 4.4 g/dL — ABNORMAL LOW (ref 6.0–8.3)

## 2010-08-26 ENCOUNTER — Ambulatory Visit: Payer: Self-pay | Admitting: Vascular Surgery

## 2010-09-13 ENCOUNTER — Ambulatory Visit (HOSPITAL_COMMUNITY)
Admission: RE | Admit: 2010-09-13 | Discharge: 2010-09-13 | Payer: Self-pay | Source: Home / Self Care | Admitting: Vascular Surgery

## 2010-09-13 HISTORY — PX: AV FISTULA PLACEMENT, RADIOCEPHALIC: SHX1208

## 2010-09-30 ENCOUNTER — Ambulatory Visit: Payer: Self-pay | Admitting: Vascular Surgery

## 2010-10-14 ENCOUNTER — Ambulatory Visit: Payer: Self-pay | Admitting: Oncology

## 2010-10-18 LAB — CBC WITH DIFFERENTIAL/PLATELET
BASO%: 0.2 % (ref 0.0–2.0)
Basophils Absolute: 0 10*3/uL (ref 0.0–0.1)
EOS%: 8.3 % — ABNORMAL HIGH (ref 0.0–7.0)
Eosinophils Absolute: 0.4 10*3/uL (ref 0.0–0.5)
HCT: 45 % (ref 38.4–49.9)
HGB: 15.1 g/dL (ref 13.0–17.1)
LYMPH%: 22.5 % (ref 14.0–49.0)
MCH: 33.4 pg (ref 27.2–33.4)
MCHC: 33.6 g/dL (ref 32.0–36.0)
MCV: 99.4 fL — ABNORMAL HIGH (ref 79.3–98.0)
MONO#: 0.5 10*3/uL (ref 0.1–0.9)
MONO%: 8.8 % (ref 0.0–14.0)
NEUT#: 3.2 10*3/uL (ref 1.5–6.5)
NEUT%: 60.2 % (ref 39.0–75.0)
Platelets: 226 10*3/uL (ref 140–400)
RBC: 4.52 10*6/uL (ref 4.20–5.82)
RDW: 13.8 % (ref 11.0–14.6)
WBC: 5.4 10*3/uL (ref 4.0–10.3)
lymph#: 1.2 10*3/uL (ref 0.9–3.3)

## 2010-10-19 ENCOUNTER — Ambulatory Visit (HOSPITAL_COMMUNITY)
Admission: RE | Admit: 2010-10-19 | Discharge: 2010-10-19 | Payer: Self-pay | Source: Home / Self Care | Attending: Vascular Surgery | Admitting: Vascular Surgery

## 2010-10-19 HISTORY — PX: AV FISTULA PLACEMENT, BRACHIOCEPHALIC: SHX1207

## 2010-10-20 LAB — COMPREHENSIVE METABOLIC PANEL
ALT: <8 U/L (ref 0–53)
AST: 15 U/L (ref 0–37)
Albumin: 3.2 g/dL — ABNORMAL LOW (ref 3.5–5.2)
Alkaline Phosphatase: 52 U/L (ref 39–117)
BUN: 20 mg/dL (ref 6–23)
CO2: 28 mEq/L (ref 19–32)
Calcium: 9.7 mg/dL (ref 8.4–10.5)
Chloride: 105 mEq/L (ref 96–112)
Creatinine, Ser: 2.85 mg/dL — ABNORMAL HIGH (ref 0.40–1.50)
Glucose, Bld: 129 mg/dL — ABNORMAL HIGH (ref 70–99)
Potassium: 3.8 mEq/L (ref 3.5–5.3)
Sodium: 142 mEq/L (ref 135–145)
Total Bilirubin: 0.7 mg/dL (ref 0.3–1.2)
Total Protein: 5.6 g/dL — ABNORMAL LOW (ref 6.0–8.3)

## 2010-10-20 LAB — KAPPA/LAMBDA LIGHT CHAINS
Kappa free light chain: 2.67 mg/dL — ABNORMAL HIGH (ref 0.33–1.94)
Kappa:Lambda Ratio: 1.05 (ref 0.26–1.65)
Lambda Free Lght Chn: 2.54 mg/dL (ref 0.57–2.63)

## 2010-10-20 LAB — IMMUNOFIXATION ELECTROPHORESIS
IgA: 199 mg/dL (ref 68–378)
IgG (Immunoglobin G), Serum: 715 mg/dL (ref 694–1618)
IgM, Serum: 99 mg/dL (ref 60–263)
Total Protein, Serum Electrophoresis: 5.6 g/dL — ABNORMAL LOW (ref 6.0–8.3)

## 2010-10-24 LAB — POCT I-STAT 4, (NA,K, GLUC, HGB,HCT)
Glucose, Bld: 99 mg/dL (ref 70–99)
HCT: 43 % (ref 39.0–52.0)
Hemoglobin: 14.6 g/dL (ref 13.0–17.0)
Potassium: 3.5 mEq/L (ref 3.5–5.1)
Sodium: 142 mEq/L (ref 135–145)

## 2010-10-24 LAB — SURGICAL PCR SCREEN
MRSA, PCR: NEGATIVE
Staphylococcus aureus: POSITIVE — AB

## 2010-10-28 ENCOUNTER — Ambulatory Visit
Admission: RE | Admit: 2010-10-28 | Discharge: 2010-10-28 | Payer: Self-pay | Source: Home / Self Care | Attending: Vascular Surgery | Admitting: Vascular Surgery

## 2010-10-28 ENCOUNTER — Ambulatory Visit: Admit: 2010-10-28 | Payer: Self-pay | Admitting: Vascular Surgery

## 2010-10-31 NOTE — Assessment & Plan Note (Addendum)
OFFICE VISIT  Shane [name], Shane [name] DOB:  1941/10/05                                       10/28/2010 [id number]#:[id number]  This is a postop followup.  HISTORY OF PRESENT ILLNESS:  This is a 70 year old gentleman who presents after a left brachiocephalic arteriovenous fistula placed on 10/19/2010.  At this point the patient denies any steal symptoms.  He is able to complete his activities of daily living without any problems. He also follows up for screening ABIs with a chief complaint of persistent coldness in both feet.  PHYSICAL EXAMINATION:  He had a blood pressure of 146/61 with a heart rate of 75, respirations were 12.  On focused physical examination today his left arm the incision is healing.  There is a palpable pulse in the fistula but I feel a weak thrill in this fistula.  On auscultation there is only a weak bruit within this fistula.  NONINVASIVE VASCULAR IMAGING:  He had bilateral lower extremity ABIs which demonstrate triphasic flow in all pedal arteries consistent with no evidence of peripheral arterial disease.  MEDICAL DECISION MAKING:  This is a 70 year old gentleman who presents status post a left brachiocephalic arteriovenous fistula.  While the fistula is patent I have concerns with the long-term viability of this fistula even though it was a smooth case it appears that there must be some degree of venous stenosis which is compromising this venous outflow in this fistula.  I would allow this fistula another 2 weeks to see if it matures any further.  When I have him follow up in another 2 to 4 weeks if it is still not functioning at that point we will have to proceed forward with a new procedure.  In regards to his cool feet there is no evidence of peripheral arterial disease so I think at this point no intervention is going to be necessary.    [name], MD Electronically Signed  BLC/MEDQ  D:  10/28/2010  T:  10/31/2010  Job:   [id number]

## 2010-11-25 ENCOUNTER — Ambulatory Visit: Payer: Self-pay | Admitting: *Deleted

## 2010-12-02 ENCOUNTER — Ambulatory Visit (INDEPENDENT_AMBULATORY_CARE_PROVIDER_SITE_OTHER): Payer: MEDICARE | Admitting: Vascular Surgery

## 2010-12-02 DIAGNOSIS — N186 End stage renal disease: Secondary | ICD-10-CM

## 2010-12-02 NOTE — Assessment & Plan Note (Signed)
OFFICE VISIT  Shane [name], Shane [name] DOB:  Oct 17, 1940                                       12/02/2010 [id number]#:[id number]  This is a postop follow-up.  HISTORY OF PRESENT ILLNESS:  This is a 70 year old gentleman who has previously undergone both a left radiocephalic and brachiocephalic arteriovenous fistula.  Previously when I saw him, there was a weak thrill in the brachiocephalic arteriovenous fistula, and I felt that it needed some more time to mature and I had already concerns that it was on its way to occlusion, which unfortunately it has now occluded, the exact time frame is unknown.  The patient denies any steal symptoms and is able to complete his activities of daily living without any difficulties.  PHYSICAL EXAMINATION:  He had a blood pressure of 161/70, heart rate of 79, respirations were 12, temperature was 98.1.  On focused examination of the left arm, he has hand grip of 5/5.  Sensation is grossly intact in his hands and fingers.  Good intrinsic extrinsic motor strength.  On examination, the brachiocephalic fistula is occluded.  The vein is distended with clot, both in the upper arm and the forearm.  MEDICAL DECISION MAKING:  This is a 70 year old gentleman who has now had 2 failed left arm fistulae.  In both cases, the veins were of adequate caliber, and I felt the inflow in both cases was good.  So I am a bit concerned that in fact this patient may have some degree of central venous stenosis that is compromising now 2 sets of cephalic-vein- based procedures.  At this point, I think prior to proceeding with his last fistulae option in this arm, I would do a left arm venogram with very limited dye to try to image the central venous system to see if it is worse making attempt of the basilic vein transposition in this patient.  I even instructed the patient to hydrate well the day before the procedure and then also the day of the procedure and  afterwards to minimize the risk of contrast dye.  We will use diluted contrast for this procedure.  Once again, I discussed this plan with the patient.  He is aware of our plans and will proceed forward with it on December 12, 2010.    [name], MD Electronically Signed  BLC/MEDQ  D:  12/02/2010  T:  12/02/2010  Job:  [id number]

## 2010-12-12 ENCOUNTER — Ambulatory Visit (HOSPITAL_COMMUNITY)
Admission: RE | Admit: 2010-12-12 | Discharge: 2010-12-12 | Disposition: A | Discharge status: Home / Self Care | Payer: Medicare Other | Source: Ambulatory Visit | Attending: Vascular Surgery | Admitting: Vascular Surgery

## 2010-12-12 DIAGNOSIS — I129 Hypertensive chronic kidney disease with stage 1 through stage 4 chronic kidney disease, or unspecified chronic kidney disease: Secondary | ICD-10-CM | POA: Insufficient documentation

## 2010-12-12 DIAGNOSIS — T82898A Other specified complication of vascular prosthetic devices, implants and grafts, initial encounter: Secondary | ICD-10-CM | POA: Insufficient documentation

## 2010-12-12 DIAGNOSIS — Y832 Surgical operation with anastomosis, bypass or graft as the cause of abnormal reaction of the patient, or of later complication, without mention of misadventure at the time of the procedure: Secondary | ICD-10-CM | POA: Insufficient documentation

## 2010-12-12 DIAGNOSIS — N189 Chronic kidney disease, unspecified: Secondary | ICD-10-CM | POA: Insufficient documentation

## 2010-12-12 DIAGNOSIS — N186 End stage renal disease: Secondary | ICD-10-CM

## 2010-12-12 DIAGNOSIS — F1210 Cannabis abuse, uncomplicated: Secondary | ICD-10-CM | POA: Insufficient documentation

## 2010-12-13 LAB — POCT I-STAT, CHEM 8
Chloride: 109 mEq/L (ref 96–112)
Creatinine, Ser: 2.9 mg/dL — ABNORMAL HIGH (ref 0.4–1.5)
Glucose, Bld: 82 mg/dL (ref 70–99)
Potassium: 4.4 mEq/L (ref 3.5–5.1)

## 2010-12-14 ENCOUNTER — Other Ambulatory Visit: Payer: Self-pay | Admitting: Medical

## 2010-12-14 ENCOUNTER — Encounter (HOSPITAL_BASED_OUTPATIENT_CLINIC_OR_DEPARTMENT_OTHER): Payer: Medicare Other | Admitting: Oncology

## 2010-12-14 DIAGNOSIS — E8589 Other amyloidosis: Secondary | ICD-10-CM

## 2010-12-14 DIAGNOSIS — N289 Disorder of kidney and ureter, unspecified: Secondary | ICD-10-CM

## 2010-12-14 DIAGNOSIS — Z5112 Encounter for antineoplastic immunotherapy: Secondary | ICD-10-CM

## 2010-12-14 DIAGNOSIS — R609 Edema, unspecified: Secondary | ICD-10-CM

## 2010-12-14 LAB — CBC WITH DIFFERENTIAL/PLATELET
Basophils Absolute: 0 10*3/uL (ref 0.0–0.1)
EOS%: 9.2 % — ABNORMAL HIGH (ref 0.0–7.0)
HCT: 41 % (ref 38.4–49.9)
HGB: 14 g/dL (ref 13.0–17.1)
LYMPH%: 20.4 % (ref 14.0–49.0)
MCH: 34 pg — ABNORMAL HIGH (ref 27.2–33.4)
MCV: 99.4 fL — ABNORMAL HIGH (ref 79.3–98.0)
MONO%: 10.2 % (ref 0.0–14.0)
NEUT%: 60.1 % (ref 39.0–75.0)
Platelets: 220 10*3/uL (ref 140–400)

## 2010-12-16 LAB — SPEP & IFE WITH QIG
Alpha-1-Globulin: 6.6 % — ABNORMAL HIGH (ref 2.9–4.9)
Gamma Globulin: 12 % (ref 11.1–18.8)
IgM, Serum: 81 mg/dL (ref 60–263)
Total Protein, Serum Electrophoresis: 4.9 g/dL — ABNORMAL LOW (ref 6.0–8.3)

## 2010-12-16 LAB — KAPPA/LAMBDA LIGHT CHAINS: Kappa:Lambda Ratio: 1 (ref 0.26–1.65)

## 2010-12-16 LAB — COMPREHENSIVE METABOLIC PANEL
ALT: <8 U/L (ref 0–53)
AST: 20 U/L (ref 0–37)
Albumin: 2.9 g/dL — ABNORMAL LOW (ref 3.5–5.2)
Alkaline Phosphatase: 42 U/L (ref 39–117)
BUN: 24 mg/dL — ABNORMAL HIGH (ref 6–23)
Calcium: 9 mg/dL (ref 8.4–10.5)
Chloride: 108 mEq/L (ref 96–112)
Potassium: 3.9 mEq/L (ref 3.5–5.3)
Sodium: 143 mEq/L (ref 135–145)
Total Protein: 4.9 g/dL — ABNORMAL LOW (ref 6.0–8.3)

## 2010-12-16 NOTE — Op Note (Signed)
  NAME:  Shane [name], Shane [name]               ACCOUNT NO.:  [id number]  MEDICAL RECORD NO.:  [id number]           PATIENT TYPE:  O  LOCATION:  [facility]                         FACILITY:  [facility]  PHYSICIAN:  [name], MD       DATE OF BIRTH:  1941-07-20  DATE OF PROCEDURE:  12/12/2010 DATE OF DISCHARGE:  12/12/2010                              OPERATIVE REPORT   PROCEDURES:  Left arm venogram and central venogram.  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  POSTOPERATIVE DIAGNOSES:  Chronic kidney disease with repeated failed left arm accesses.  SURGEON:  [name], MD  ANESTHESIA:  None.  ESTIMATED BLOOD LOSS:  Minimal.  Contrast was about 20 mL.  Specimens in this case were none.  Findings: a small basilic vein at the level of the antecubitum that is  not compatible with a fistula, widely patent axillary subclavian and left innominate vein.  INDICATIONS:  This is a 70 year old gentleman, who has now today undergone two sets of left arm fistula placement, which both failed though the vein and also the artery was deemed to be of good quality. There was some concern that prior to proceeding with a basilic vein transposition that an evaluation for central venous stenosis should be accomplished, so the plan was to perform a left arm venogram and central venogram to interrogate these two different issues.  The patient is aware the risks of this procedure including anaphylactic reaction to the dye and possible risk of triggering acute on chronic renal failure.  DESCRIPTION OF OPERATION:  After full informed written consent had been obtained from the patient, he was brought back to the angio suite, and placed supine upon the angio table.  He had a left forearm IV that previously had been placed.  This was connected to extension tubing and then used to instill dye into the left arm under fluoroscopic guidance. A couple of injections were  completed, the findings of which are listed as above.  Based on these findings, a basilic vein transposition in a left arm is unlikely to function well.  He had excellent upper arm target for a graft eventually, but as this gentleman is not on dialysis currently, there is no need to immediately pursue that option.  After completing these images, the plan was to take him back to the holding area and after observation for any type of anaphylactic reaction for about an hour then the IV will be removed, and the patient will be discharged.  COMPLICATIONS:  None.  CONDITION:  Stable.     [name], MD     BLC/MEDQ  D:  12/12/2010  T:  12/13/2010  Job:  [id number]  Electronically Signed by Leonides Sake MD on 12/15/2010 09:27:47 AM

## 2010-12-20 LAB — POCT I-STAT 4, (NA,K, GLUC, HGB,HCT)
HCT: 40 % (ref 39.0–52.0)
Potassium: 3.8 mEq/L (ref 3.5–5.1)
Sodium: 141 mEq/L (ref 135–145)

## 2010-12-20 LAB — DIFFERENTIAL
Eosinophils Relative: 4 % (ref 0–5)
Lymphocytes Relative: 19 % (ref 12–46)
Lymphs Abs: 1.4 10*3/uL (ref 0.7–4.0)
Monocytes Absolute: 0.8 10*3/uL (ref 0.1–1.0)

## 2010-12-20 LAB — CBC
HCT: 36.7 % — ABNORMAL LOW (ref 39.0–52.0)
MCH: 33.7 pg (ref 26.0–34.0)
MCV: 100.7 fL — ABNORMAL HIGH (ref 78.0–100.0)
RBC: 3.65 MIL/uL — ABNORMAL LOW (ref 4.22–5.81)
RDW: 15.1 % (ref 11.5–15.5)
WBC: 7 10*3/uL (ref 4.0–10.5)

## 2010-12-20 LAB — BONE MARROW EXAM

## 2010-12-20 LAB — CHROMOSOME ANALYSIS, BONE MARROW

## 2010-12-22 LAB — COMPREHENSIVE METABOLIC PANEL
ALT: <8 U/L (ref 0–53)
AST: 16 U/L (ref 0–37)
Alkaline Phosphatase: 47 U/L (ref 39–117)
CO2: 26 mEq/L (ref 19–32)
Chloride: 107 mEq/L (ref 96–112)
GFR calc Af Amer: 18 mL/min — ABNORMAL LOW (ref >60)
GFR calc non Af Amer: 15 mL/min — ABNORMAL LOW (ref >60)
Sodium: 141 mEq/L (ref 135–145)
Total Bilirubin: 0.9 mg/dL (ref 0.3–1.2)

## 2010-12-22 LAB — URINE MICROSCOPIC-ADD ON

## 2010-12-22 LAB — DIFFERENTIAL
Basophils Absolute: 0 10*3/uL (ref 0.0–0.1)
Eosinophils Relative: 0 % (ref 0–5)
Lymphs Abs: 1.2 10*3/uL (ref 0.7–4.0)
Monocytes Absolute: 0.6 10*3/uL (ref 0.1–1.0)

## 2010-12-22 LAB — URINALYSIS, ROUTINE W REFLEX MICROSCOPIC
Bilirubin Urine: NEGATIVE
Specific Gravity, Urine: 1.025 (ref 1.005–1.030)
Urobilinogen, UA: 1 mg/dL (ref 0.0–1.0)

## 2010-12-22 LAB — CBC
Hemoglobin: 12.3 g/dL — ABNORMAL LOW (ref 13.0–17.0)
RBC: 3.72 MIL/uL — ABNORMAL LOW (ref 4.22–5.81)

## 2010-12-25 LAB — DIFFERENTIAL
Basophils Absolute: 0.1 10*3/uL (ref 0.0–0.1)
Lymphocytes Relative: 19 % (ref 12–46)
Monocytes Absolute: 0.6 10*3/uL (ref 0.1–1.0)
Neutro Abs: 4 10*3/uL (ref 1.7–7.7)

## 2010-12-25 LAB — CBC
Hemoglobin: 12.6 g/dL — ABNORMAL LOW (ref 13.0–17.0)
Platelets: 240 10*3/uL (ref 150–400)
RDW: 13.7 % (ref 11.5–15.5)

## 2010-12-25 LAB — CHROMOSOME ANALYSIS, BONE MARROW

## 2010-12-25 LAB — TISSUE HYBRIDIZATION (BONE MARROW)-NCBH

## 2010-12-25 LAB — BONE MARROW EXAM: Bone Marrow Exam: 440

## 2010-12-26 LAB — COMPREHENSIVE METABOLIC PANEL
AST: 24 U/L (ref 0–37)
Albumin: 1.7 g/dL — ABNORMAL LOW (ref 3.5–5.2)
Alkaline Phosphatase: 58 U/L (ref 39–117)
Chloride: 113 mEq/L — ABNORMAL HIGH (ref 96–112)
GFR calc Af Amer: 28 mL/min — ABNORMAL LOW (ref >60)
Potassium: 3.8 mEq/L (ref 3.5–5.1)
Sodium: 142 mEq/L (ref 135–145)
Total Bilirubin: 0.6 mg/dL (ref 0.3–1.2)

## 2010-12-26 LAB — CBC
HCT: 35.9 % — ABNORMAL LOW (ref 39.0–52.0)
MCHC: 33.7 g/dL (ref 30.0–36.0)
Platelets: 224 10*3/uL (ref 150–400)
Platelets: 224 10*3/uL (ref 150–400)
Platelets: 259 10*3/uL (ref 150–400)
RBC: 3.52 MIL/uL — ABNORMAL LOW (ref 4.22–5.81)
RDW: 14.2 % (ref 11.5–15.5)
WBC: 6.4 10*3/uL (ref 4.0–10.5)
WBC: 6.4 10*3/uL (ref 4.0–10.5)

## 2010-12-26 LAB — RENAL FUNCTION PANEL
Albumin: 1.4 g/dL — ABNORMAL LOW (ref 3.5–5.2)
CO2: 24 mEq/L (ref 19–32)
Calcium: 8.4 mg/dL (ref 8.4–10.5)
Creatinine, Ser: 2.67 mg/dL — ABNORMAL HIGH (ref 0.4–1.5)
GFR calc Af Amer: 29 mL/min — ABNORMAL LOW (ref >60)
GFR calc non Af Amer: 24 mL/min — ABNORMAL LOW (ref >60)
Phosphorus: 2.9 mg/dL (ref 2.3–4.6)
Sodium: 140 mEq/L (ref 135–145)

## 2010-12-26 LAB — CROSSMATCH
ABO/RH(D): A POS
Antibody Screen: NEGATIVE

## 2010-12-26 LAB — APTT: aPTT: 28 seconds (ref 24–37)

## 2010-12-26 LAB — PLATELET FUNCTION ASSAY: Collagen / Epinephrine: 99 seconds (ref 0–184)

## 2010-12-30 ENCOUNTER — Ambulatory Visit (INDEPENDENT_AMBULATORY_CARE_PROVIDER_SITE_OTHER): Payer: Medicare Other | Admitting: Vascular Surgery

## 2010-12-30 DIAGNOSIS — N186 End stage renal disease: Secondary | ICD-10-CM

## 2011-01-02 NOTE — Assessment & Plan Note (Signed)
OFFICE VISIT  HAZEL, WRINKLE DOB:  04-24-1941                                       12/30/2010 ZOXWR#:[id number]  This is a postop followup.  HISTORY OF PRESENT ILLNESS:  This is a 70 year old gentleman that I recently did a left arm venogram and central venogram on prior to proceeding with a basilic vein transposition.  Unfortunately, on this image it appears that the basilic vein is of inadequate caliber to successfully pull off a staged basilic vein transposition.  The patient is not on dialysis currently.  He denies any steal symptoms.  He is able to complete his activities of daily living without any problem.  PHYSICAL EXAMINATION:  He had a blood pressure of 131/71, a heart rate of 69, respirations were 12.  Focused exam of the left arm demonstrates a palpable brachial pulse, no palpable radial pulse.  Hand grip was 5/5 with sensation intact distally.  The left brachiocephalic arteriovenous fistula is thrombosed.  On the right side he has excellent distended veins in the cephalic distribution of the forearm extending onto his upper arm, an extensive amount of branching in the cephalic vein and a strongly palpable radial pulse and brachial pulse.  MEDICAL DECISION-MAKING:  This is a 70 year old gentleman who still is not on dialysis.  I think at this point even though his right arm is his dominant arm, I would attempt to place a radiocephalic in this side.  It is going to require placement of the radiocephalic and additionally ligation of some of the side branches to get this successfully executed. We discussed the plan with the patient and he is interested in proceeding.  He wants to wait until after Easter, however, so he would like to wait to April 16, at which time we will attempt to place a radiocephalic AVF in him.    [name], MD Electronically Signed  BLC/MEDQ  D:  12/30/2010  T:  01/02/2011  Job:  [id number]

## 2011-01-30 ENCOUNTER — Ambulatory Visit (HOSPITAL_COMMUNITY): Payer: Medicare Other

## 2011-01-30 ENCOUNTER — Ambulatory Visit (HOSPITAL_COMMUNITY)
Admission: RE | Admit: 2011-01-30 | Discharge: 2011-01-30 | Disposition: A | Discharge status: Home / Self Care | Payer: Medicare Other | Source: Ambulatory Visit | Attending: Vascular Surgery | Admitting: Vascular Surgery

## 2011-01-30 DIAGNOSIS — I129 Hypertensive chronic kidney disease with stage 1 through stage 4 chronic kidney disease, or unspecified chronic kidney disease: Secondary | ICD-10-CM | POA: Insufficient documentation

## 2011-01-30 DIAGNOSIS — I120 Hypertensive chronic kidney disease with stage 5 chronic kidney disease or end stage renal disease: Secondary | ICD-10-CM

## 2011-01-30 DIAGNOSIS — N186 End stage renal disease: Secondary | ICD-10-CM

## 2011-01-30 DIAGNOSIS — N189 Chronic kidney disease, unspecified: Secondary | ICD-10-CM | POA: Insufficient documentation

## 2011-01-30 HISTORY — PX: AV FISTULA PLACEMENT, RADIOCEPHALIC: SHX1208

## 2011-01-30 LAB — POCT I-STAT 4, (NA,K, GLUC, HGB,HCT)
Glucose, Bld: 79 mg/dL (ref 70–99)
HCT: 41 % (ref 39.0–52.0)
Potassium: 3.8 mEq/L (ref 3.5–5.1)
Sodium: 144 mEq/L (ref 135–145)

## 2011-02-01 NOTE — Op Note (Signed)
Shane [name], Shane [name]               ACCOUNT NO.:  [id number]  MEDICAL RECORD NO.:  [id number]           PATIENT TYPE:  LOCATION:                                 FACILITY:  PHYSICIAN:  [name], MD       DATE OF BIRTH:  [birth year]  DATE OF PROCEDURE: DATE OF DISCHARGE:                              OPERATIVE REPORT   PROCEDURE:  1. Right radiocephalic arteriovenous fistula placement 2. Ligation of competing venous branch.  PREOPERATIVE DIAGNOSIS:  Chronic kidney disease.  POSTOPERATIVE DIAGNOSIS:  Chronic kidney disease.  SURGEON:  [name], MD  ASSISTANT:  Pecola Leisure, PA.  ANESTHESIA:  General.  FINDINGS IN THIS CASE:  Included 1. A monophasic radial artery proximal to the anastomosis. 2. A dopplerable venous outflow. 3. A palpable right radial pulse.  SPECIMENS:  None.  ESTIMATED BLOOD LOSS:  Minimal.  INDICATIONS:  This is a 70 year old gentleman who has chronic kidney disease stage IV-V who has undergone previously left arm access procedures which have failed to mature.  Now he is presenting for an attempt at a right radiocephalic versus brachiocephalic arteriovenous fistula. He is aware of the risks include but are not limited to: bleeding, infection, possible steal, possible ischemic monomelic neuropathy, possible  nerve damage, possible failure to mature, possible need for additional  procedures.  He is aware of these risks and has agreed to proceed forward such.  DESCRIPTION OF THE OPERATION:  After full informed written consent was obtained from the patient, he was brought back to the operating room and placed supine upon the operating table.  Prior to induction, he had received IV antibiotics.  After obtaining adequate anesthesia, he was then prepped and draped in the standard fashion for a right arm access procedure.  We turned our attention to the wrist, I placed a sterile Penrose drain around the upper arm to distend the veins. There was a  excellent vein slightly on the dorsal surface of his arm.  I made a transverse incision over the radial artery at the level of the wrist extending toward his cephalic vein branch.  Using blunt dissection and  electrocautery, I developed a plane down to the radial artery, which appeared to be somewhat small, measurements was only about 2.5 mm.  Also, it had a fairly weak pulse.  I then dissected out laterally onto the dorsum of this patient's arm and found an excellent size, cephalic vein which appeared to be at least 3.5 mm externally, this was dissected out and then clamped distally, then transected.  I then tied off the distal vein with a 2-0 silk.  The vein was then interrogated with serial dilators and accepted up to a 3.5 mm without any difficulty.  I instilled heparinized saline in this vein and then clamped it with a Serrefine clamp.  At this point, the radial artery was dissected out proximally and distally, and controlled with vessel loops.  I made a arteriotomy with an 11-blade, extended it with Potts scissor and then the vein was anastomosed to the artery in an end-to-side fashion using a running stitch of 7-0 Prolene. Prior  to completing this anastomosis, I allowed the artery to bleed in an antegrade fashion.  The antegrade bleeding was acceptable, but not particularly pulsatile.  There was no clot visualized.  There was also present retrograde bleeding and also venous backbleeding.  We completed the anastomosis in the usual fashion and then removed all clamps and vessel loops.  The vein immediately distended, and initially, there was a strong pulse in it.  At this point, the patient's systolic blood pressure was only in the 90s.  Using a continuous Doppler, I dopplered the artery, there was a strong monophasic signal proximal to the anastomosis and then also distal to the anastomosis, there was actually still a palpable radial pulse.  There was not particularly a strong pulse  or thrill in the venous outflow.  There was a dopplerable venous outflow signal as it was not particularly strong, I examined his arm, it looked like of the 2 draining veins in his forearm.  His primary cephalic vein branch appeared to be actually sclerotic on examination and actually the more viable and larger vein branch was in fact the cephalic branch that drained into the cubital vein in this patient.  To facilitate the venous outflow in this case, I made a stab incision over the main cephalic vein distally and then dissected it out using blunt dissection.  The vein appeared  to be sclerotic externally. I then tied this off with 2 sets of 2-0 silk.   This should force the venous outflow into the larger vein draining into the  cubital vein which was visibly draining into both the basilic and also the cephalic vein system.  This appears to be a better venous drainage system than the main cephalic vein system.  At this point, all wounds were irrigated out.  I placed a little bit of thrombin and Gelfoam in the wrist exposure.  The stab incision was closed with a U stitch of 4-0 Monocryl and then reinforced with Dermabond.  The wrist was then closed with a layer of 3-0 Vicryl and then the skin was reapproximated with running subcuticular 4-0 Monocryl.  The skin was then cleaned, dried and then reinforced with Dermabond.  At this point, I listened to the fistula, again it appeared to be more audible and now that the pressure had increased slightly to 136 systolically.  There remained a palpable radial pulse in this patient. The patient was allowed to awaken without  difficulties.  Complications: none.  Condition: none.     [name], MD     BLC/MEDQ  D:  01/30/2011  T:  01/30/2011  Job:  [id number]  Electronically Signed by Leonides Sake MD on 02/01/2011 11:30:24 AM

## 2011-02-10 ENCOUNTER — Ambulatory Visit (INDEPENDENT_AMBULATORY_CARE_PROVIDER_SITE_OTHER): Payer: Medicare Other | Admitting: Vascular Surgery

## 2011-02-10 DIAGNOSIS — N186 End stage renal disease: Secondary | ICD-10-CM

## 2011-02-13 NOTE — Assessment & Plan Note (Signed)
OFFICE VISIT  Shane [name], Shane [name] DOB:  07-22-1941                                       02/10/2011 EAVWU#:[id number]  This is a postop followup.  HISTORY OF PRESENT ILLNESS:  A 70 year old gentleman status post a right radiocephalic arteriovenous fistula placed on 01/30/2011.  The patient has had no steal symptoms since then, has been recovering well.  No drainage from any of his wounds or any fevers or chills.  PHYSICAL EXAMINATION:  Blood pressure 147/86, respirations were 12, heart rate of 54, temperature was 97.4.  On focused exam the right hand has 5/5 hand grip.  Incisions are healing well.  The cephalic vein is distending, however, I do not feel a strong thrill in it at all and I also while I feel a pulse in it, it did not feel particularly strong.  I dopplered it with ultrasound and it is fairly weak.  MEDICAL DECISION MAKING:  This is a 70 year old gentleman status post a right radiocephalic arteriovenous fistula.  I doubt this radiocephalic arteriovenous fistula will be functional and I suspect it is probably going to thrombose.  I would give it another 4 weeks to try to mature though I am not optimistic that it will.  His next operation will need to be a right brachiocephalic arteriovenous fistula.  Unfortunately my suspicion for this radiocephalic is that the radial inflow was inadequate to support this fistula.  This is somewhat confirmed by the Doppler on the radial artery which was weakly monophasic so this gentleman may in fact have some degree of upper extremity atherosclerotic disease.  Additional studies if this fails probably will be needed, most likely an upper extremity arterial duplex completed.    [name], MD Electronically Signed  BLC/MEDQ  D:  02/10/2011  T:  02/13/2011  Job:  [id number]

## 2011-02-17 ENCOUNTER — Encounter: Payer: Self-pay | Admitting: Vascular Surgery

## 2011-02-21 NOTE — Assessment & Plan Note (Signed)
OFFICE VISIT   YAMA, NIELSON  DOB:  09-04-1941                                       07/22/2010  BJYNW#:[id number]   This is a new patient consultation.  This is being requested by Dr.  Irena Cords.  The reason for consultation is new access.   HISTORY OF PRESENT ILLNESS:  This is a 70 year old gentleman who has  chronic kidney disease who presents with chief complaint of need for  access.  He is right-hand dominant approaching end-stage renal disease  with need for dialysis but at this point has not been started on  dialysis.  Denies any previous accesses.  No previous central venous  catheterization.  He still is able to produce some urine but was told  that he would likely need access placed due to progressive kidney  disease secondary to a renal cancer.  He is currently undergoing  chemotherapy but at this point has not responded, has responded  inadequately to his chemotherapy.   PAST MEDICAL HISTORY:  1. 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  2. Hypertension.  3. History of incarcerated right inguinal hernia.  4. Tobacco abuse.  5. Varicocele.  6. Renal cancer.   PAST SURGICAL HISTORY:  Was none.   SOCIAL HISTORY:  Greater than 40 pack year history of smoking and he  currently is actively smoking.  No alcohol or illicit drug use noted.   FAMILY HISTORY:  Mother had asthma and father died of delirium tremors.   MEDICATIONS:  Included Vicodin, Compazine, metoprolol, Norvasc, Flomax  and Lasix.   He has no known drug allergies.   REVIEW OF SYSTEMS:  Was listed as loss of appetite, pain in legs with  walking, shortness of breath when lying flat, shortness of breath with  exertion, kidney disease, burning with urination, frequent urination,  difficult urinating, diarrhea and constipation.  Otherwise his remaining  12 point review of systems was noted to be negative.   PHYSICAL EXAMINATION:   Vital signs:  Blood pressure 145/84, heart rate  of 65, respirations of 12.  General:  Well-developed, well-nourished, no apparent distress.  Head:  Normocephalic, atraumatic.  ENT:  Hearing was grossly intact.  The nares had no erythema or any  drainage.  The oropharynx without any erythema or exudate.  Neck:  Supple neck without any nuchal rigidity.  Eyes:  Pupils equal, round and reactive to light.  Extraocular movements  intact.  Pulmonary:  Clear to auscultation bilaterally, symmetric expansion, good  air movement.  No rhonchi, rales or wheezes.  Cardiac:  Regular rate and rhythm.  Normal S1, S2.  No murmurs, rubs,  thrills or gallops.  Vascular:  Easily palpable radial pulses, brachial  and carotids.  I was not able to appreciate the aorta.  He had palpable  femoral pulses.  I did not appreciate popliteal or pedal pulses on this  patient.  GI: Soft, nontender, nondistended.  No guarding, no rebound, no  hepatosplenomegaly.  There were no masses.  Musculoskeletal:  He had 5/5 strength in all extremities.  There were no  obvious lesions on any extremity.  Skin:  The extremity exam as noted above.  Otherwise the rest of the  body I did not note any rashes.  Neurological:  Cranial nerves II-XII were intact.  Musculoskeletal:  Was  as noted above.  The sensory exam demonstrated intact sensation in all  extremities.  Psychiatric:  His judgment appeared intact.  Mood and affect were  appropriate for his situation.  Lymphatic:  No cervical, inguinal or axillary lymphadenopathy.   NON-INVASIVE VASCULAR IMAGING:  Bilateral upper extremity vein mapping.  In the right arm both the cephalic and the basilic vein are noted to be  adequate.  On the left side both the cephalic vein and the basilic vein  are adequate.   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  While he  is on chemotherapy at this point  he does not appear to be completely responsive to such and his  nephrologist believes that he needs permanent access placement.  The  patient at this point is somewhat reluctant to proceed forward.  He  wants to discuss the issue further with his nephrologist.  Based on his  vein mapping he is a good candidate for fistulas in both arms.  Being a  right hand dominant patient I would place first a radiocephalic fistula  in this patient and then possibly a brachiocephalic if this was not  sufficient.  He is going to talk it over with his nephrologist and once  he makes a decision will have him contact us to give [place] approval to  proceed forward with placement of a left radiocephalic fistula.     Leonides Sake, MD  Electronically Signed   BC/MEDQ  D:  07/22/2010  T:  07/25/2010  Job:  [id number]   cc:   Terrial Rhodes, M.D.

## 2011-02-21 NOTE — Assessment & Plan Note (Signed)
OFFICE VISIT   Shane [name], Shane [name]  DOB:  04/25/1941                                       09/30/2010  [id number]#:[id number]   This is a postop followup.   HISTORY OF PRESENT ILLNESS:  This is a 70 year old gentleman status post  left radiocephalic arteriovenous fistula that presents for followup.  He  has had no steal symptoms in the left hand.  He is able to complete his  activities of daily living without any difficulties.  He does not note  any thrill in this fistula.  In reviewing my previous notes the radial  artery on this side was only about 2.5 mm and there was already concern  postoperatively whether or not this was going to be a successful fistula  as at the end of the case there was only a weak thrill at the venous  outflow.  It was a combination of both poor arterial inflow and also a  small cephalic vein outflow that appeared to be somewhat diseased that  made me feel this fistula was at high risk for failure.  It appears that  at some time since the followup from the procedure on 09/13/2010 the  fistula has thrombosed.   PHYSICAL EXAMINATION:  He had a temperature of 98.1, blood pressure of  126/90, a heart rate of 90, respirations were 12.  The incision in the  left wrist has healed well.  There is no easily palpable radial pulse  here.  There is a dopplerable signal.  He has good cap refill less than  2 seconds in the fingers and motor strength is 5/5 in the hand both at  intrinsic and extrinsic muscles.  Sensation is grossly intact except for  some mild numbness in the fifth finger which was present prior to the  procedure.  Also under SonoSite his brachial artery is noted to be about  5.6 mm in diameter.  There appears to be also brachial vein about 3-4 mm  directly adjacent to the brachial artery.  Also there is an excellent  cephalic vein that is engorged in the upper arm.  In reviewing the  previous left arm vein mapping the upper arm  cephalic vein is between  0.38 and 0.44 cm in diameter in the upper arm on the left side.   MEDICAL DECISION MAKING:  This is a 70 year old gentleman with failed  radiocephalic arteriovenous fistula.  At this point I would proceed with  brachiocephalic arteriovenous fistula in the left upper arm.  We  discussed again the risk of this procedure which included bleeding,  infection, possible steal, possible nerve damage, possible ischemic  monomelic neuropathy, possible failure to mature, possible need for  additional procedures.  The patient agrees to proceed forward with this  procedure and it is tentatively scheduled for 10/19/2010.   Additionally, per some of this patient's complaints at the end of the  interview, I am going to order bilateral lower extremity ABIs as a  screening study for possible peripheral arterial disease.  He is going  to have that done over the next month and we will see him in about a  month for that.     [name], MD  Electronically Signed   BLC/MEDQ  D:  09/30/2010  T:  09/30/2010  Job:  [id number]

## 2011-02-21 NOTE — Assessment & Plan Note (Signed)
OFFICE VISIT   Shane [name], Shane [name]  DOB:  03-29-1941                                       08/26/2010  BJYNW#:[id number]   This is an established patient.  This is a 70 year old gentleman who I  have previously evaluated for new access.  He was hesitant to proceed  forward with a left radiocephalic for brachiocephalic arteriovenous  fistula until he discussed it with his nephrologist.  At this he has had  his discussion with his nephrologist and agrees to proceed forward with  a left radiocephalic or brachiocephalic arteriovenous fistula.  His left  basilic vein also is excellent and if either of these procedures end up  failing, he will have another fistula option in his left arm.  I will  try the radiocephalic first.  If the vein is inadequate, proceed with  brachiocephalic fistula.  We had a long, extensive discussion of the  risks and benefits of this procedure for about 15 minutes.  The risks I  discussed with him included but were not limited to bleeding, infection,  nerve damage, steal syndrome, ischemic monomelic neuropathy, failure to  mature and need for additional procedures.  He agreed to proceed forward  with such and it will be scheduled for September 13, 2010.     Leonides Sake, MD  Electronically Signed   BC/MEDQ  D:  08/26/2010  T:  08/29/2010  Job:  [phone] [id number]

## 2011-02-21 NOTE — Procedures (Signed)
CEPHALIC VEIN MAPPING   INDICATION:  Arteriovenous fistula placement.   HISTORY:  CKD.   EXAM:  Bilateral upper extremity vein mappings.   The right cephalic vein is compressible.   Diameter measurements range from 0.35 to 0.26 cm.   The right basilic vein is compressible as well.   Diameter measurements range from 0.53 to 0.70 cm.   The left cephalic vein is compressible.   Diameter measurements range from 0.30 to 0.48 cm.   The left basilic vein is compressible.   Diameter measurements range from 0.52 to 0.70 cm.   See attached worksheet for all measurements.   IMPRESSION:  Patent cephalic veins, bilateral patent cephalic veins of  acceptable diameter for use as a dialysis access site as well as basilic  veins.   ___________________________________________  Leonides Sake, MD   OD/MEDQ  D:  07/22/2010  T:  07/22/2010  Job:  [id number]

## 2011-03-17 ENCOUNTER — Encounter: Payer: Self-pay | Admitting: Vascular Surgery

## 2011-03-17 ENCOUNTER — Ambulatory Visit (INDEPENDENT_AMBULATORY_CARE_PROVIDER_SITE_OTHER): Payer: Medicare Other | Admitting: Vascular Surgery

## 2011-03-17 VITALS — BP 181/93 | HR 55 | Resp 18

## 2011-03-17 NOTE — Progress Notes (Signed)
[facility] [place]  Postoperative Visit  History of Present Illness  Shane [name] is a 70 y.o. year old male who presents for postoperative follow-up for: R RC AVF (Date: 01/20/11).  The patient's wound are healed healed.  The patient notes no steal symptoms.  The patient is able to complete their activities of daily living.  The patient's current symptoms are: B hand paraesthesia in ulnar artery distribution.  Physical Examination  Filed Vitals:   03/17/11 0917  BP: 181/93  Pulse: 55  Resp: 18   RU extremity: Incision is healed, skin feels warm, hand grip is 5/5, sensation in digits is grossly intact, there is no thrill in the AVF  Medical Decision Making  Shane [name] is a 70 y.o. year old male who presents s/p R RC AVF.  Unfortunately his R RC AVF has thrombosed.  This is now the 3rd access to fail in this patient.  The two RC AVF were marginal due to small radial arteries but the L BC AVF surprisingly failed.  His upper arm cephalic vein appears acceptable for a BC AVF.   The pt's R BC AVF will be scheduled on 26th June 2012.  Thank you for allowing [place] to participate in this patient's care.  Leonides Sake, MD [facility] Pager: [phone]

## 2011-04-04 ENCOUNTER — Ambulatory Visit (HOSPITAL_COMMUNITY): Admission: RE | Admit: 2011-04-04 | Payer: Medicare Other | Source: Ambulatory Visit | Admitting: Vascular Surgery

## 2011-07-17 LAB — I-STAT 8, (EC8 V) (CONVERTED LAB)
Acid-Base Excess: 2
Chloride: 106
Glucose, Bld: 91
Hemoglobin: 15.6
Potassium: 3.9
Sodium: 136
TCO2: 30
pH, Ven: 7.388 — ABNORMAL HIGH

## 2011-07-17 LAB — POCT URINALYSIS DIP (DEVICE)
Bilirubin Urine: NEGATIVE
Glucose, UA: NEGATIVE
Ketones, ur: NEGATIVE
Nitrite: NEGATIVE
pH: 6.5

## 2011-07-17 LAB — POCT I-STAT CREATININE: Operator id: 247071

## 2013-02-08 ENCOUNTER — Observation Stay (HOSPITAL_COMMUNITY)
Admission: EM | Admit: 2013-02-08 | Discharge: 2013-02-11 | Disposition: A | Discharge status: Home / Self Care | Payer: Medicare Other | Attending: Internal Medicine | Admitting: Internal Medicine

## 2013-02-08 ENCOUNTER — Emergency Department (HOSPITAL_COMMUNITY): Payer: Medicare Other

## 2013-02-08 ENCOUNTER — Other Ambulatory Visit: Payer: Self-pay

## 2013-02-08 DIAGNOSIS — IMO0002 Reserved for concepts with insufficient information to code with codable children: Secondary | ICD-10-CM | POA: Insufficient documentation

## 2013-02-08 DIAGNOSIS — Z85528 Personal history of other malignant neoplasm of kidney: Secondary | ICD-10-CM | POA: Insufficient documentation

## 2013-02-08 DIAGNOSIS — I10 Essential (primary) hypertension: Secondary | ICD-10-CM | POA: Diagnosis present

## 2013-02-08 DIAGNOSIS — F17200 Nicotine dependence, unspecified, uncomplicated: Secondary | ICD-10-CM | POA: Insufficient documentation

## 2013-02-08 DIAGNOSIS — I509 Heart failure, unspecified: Secondary | ICD-10-CM

## 2013-02-08 DIAGNOSIS — N179 Acute kidney failure, unspecified: Secondary | ICD-10-CM | POA: Insufficient documentation

## 2013-02-08 DIAGNOSIS — E8589 Other amyloidosis: Secondary | ICD-10-CM | POA: Insufficient documentation

## 2013-02-08 DIAGNOSIS — I129 Hypertensive chronic kidney disease with stage 1 through stage 4 chronic kidney disease, or unspecified chronic kidney disease: Secondary | ICD-10-CM | POA: Insufficient documentation

## 2013-02-08 DIAGNOSIS — R531 Weakness: Secondary | ICD-10-CM | POA: Diagnosis present

## 2013-02-08 DIAGNOSIS — E46 Unspecified protein-calorie malnutrition: Secondary | ICD-10-CM | POA: Insufficient documentation

## 2013-02-08 DIAGNOSIS — N184 Chronic kidney disease, stage 4 (severe): Secondary | ICD-10-CM | POA: Insufficient documentation

## 2013-02-08 DIAGNOSIS — R279 Unspecified lack of coordination: Secondary | ICD-10-CM | POA: Insufficient documentation

## 2013-02-08 DIAGNOSIS — R197 Diarrhea, unspecified: Secondary | ICD-10-CM | POA: Insufficient documentation

## 2013-02-08 DIAGNOSIS — R339 Retention of urine, unspecified: Secondary | ICD-10-CM | POA: Insufficient documentation

## 2013-02-08 DIAGNOSIS — R5383 Other fatigue: Secondary | ICD-10-CM | POA: Insufficient documentation

## 2013-02-08 DIAGNOSIS — E860 Dehydration: Secondary | ICD-10-CM

## 2013-02-08 DIAGNOSIS — N19 Unspecified kidney failure: Secondary | ICD-10-CM

## 2013-02-08 DIAGNOSIS — R911 Solitary pulmonary nodule: Secondary | ICD-10-CM | POA: Insufficient documentation

## 2013-02-08 DIAGNOSIS — R824 Acetonuria: Secondary | ICD-10-CM | POA: Insufficient documentation

## 2013-02-08 DIAGNOSIS — R310 Gross hematuria: Secondary | ICD-10-CM | POA: Insufficient documentation

## 2013-02-08 DIAGNOSIS — R809 Proteinuria, unspecified: Secondary | ICD-10-CM | POA: Insufficient documentation

## 2013-02-08 DIAGNOSIS — R630 Anorexia: Secondary | ICD-10-CM | POA: Insufficient documentation

## 2013-02-08 DIAGNOSIS — R0602 Shortness of breath: Secondary | ICD-10-CM | POA: Insufficient documentation

## 2013-02-08 DIAGNOSIS — N058 Unspecified nephritic syndrome with other morphologic changes: Secondary | ICD-10-CM | POA: Insufficient documentation

## 2013-02-08 DIAGNOSIS — N189 Chronic kidney disease, unspecified: Secondary | ICD-10-CM | POA: Diagnosis present

## 2013-02-08 DIAGNOSIS — Z720 Tobacco use: Secondary | ICD-10-CM | POA: Diagnosis present

## 2013-02-08 DIAGNOSIS — R5381 Other malaise: Principal | ICD-10-CM | POA: Insufficient documentation

## 2013-02-08 LAB — CBC WITH DIFFERENTIAL/PLATELET
Basophils Absolute: 0 10*3/uL (ref 0.0–0.1)
Basophils Relative: 0 % (ref 0–1)
Eosinophils Absolute: 0 10*3/uL (ref 0.0–0.7)
Eosinophils Relative: 0 % (ref 0–5)
Lymphs Abs: 0.8 10*3/uL (ref 0.7–4.0)
MCH: 32.7 pg (ref 26.0–34.0)
Neutrophils Relative %: 69 % (ref 43–77)
Platelets: 158 10*3/uL (ref 150–400)
RBC: 4.65 MIL/uL (ref 4.22–5.81)
RDW: 13.1 % (ref 11.5–15.5)

## 2013-02-08 LAB — PRO B NATRIURETIC PEPTIDE: Pro B Natriuretic peptide (BNP): 1411 pg/mL — ABNORMAL HIGH (ref 0–125)

## 2013-02-08 LAB — URINALYSIS, ROUTINE W REFLEX MICROSCOPIC
Bilirubin Urine: NEGATIVE
Ketones, ur: NEGATIVE mg/dL
Leukocytes, UA: NEGATIVE
Nitrite: NEGATIVE
Protein, ur: >300 mg/dL — AB

## 2013-02-08 LAB — BASIC METABOLIC PANEL
Calcium: 8.8 mg/dL (ref 8.4–10.5)
GFR calc Af Amer: 16 mL/min — ABNORMAL LOW (ref >90)
GFR calc non Af Amer: 14 mL/min — ABNORMAL LOW (ref >90)
Potassium: 4 mEq/L (ref 3.5–5.1)
Sodium: 141 mEq/L (ref 135–145)

## 2013-02-08 LAB — URINE MICROSCOPIC-ADD ON

## 2013-02-08 LAB — TROPONIN I: Troponin I: <0.3 ng/mL (ref <0.30)

## 2013-02-08 MED ORDER — SODIUM CHLORIDE 0.9 % IV SOLN
1000.0000 mL | Freq: Once | INTRAVENOUS | Status: AC
  Administered 2013-02-08: 1000 mL via INTRAVENOUS

## 2013-02-08 MED ORDER — SODIUM CHLORIDE 0.9 % IV SOLN: 1000.0000 mL | INTRAVENOUS | Status: DC

## 2013-02-08 NOTE — ED Notes (Signed)
Bladder scan prior to voiding 377 . The post residual void 177. EDP aware.

## 2013-02-08 NOTE — ED Notes (Signed)
Pt c/o fatigue x 1 week after coming home from church. Productive cough x 1 week, SOB, generalized body aches. Denies fever, chills, nausea, vomiting.

## 2013-02-08 NOTE — H&P (Signed)
Hospital Admission Note Date: 02/08/2013  Patient name: Shane [name] Medical record number: [id number] Date of birth: 1940/12/02 Age: 72 y.o. Gender: male PCP: [name], MD  _________________________________________________________ INTERNAL MEDICINE TEACHING SERVICE CONTACT INFO     Weekday Hours (7AM-5PM): ** If no return call within 15 minutes (after trying both pagers listed below), please call after hours pagers.    1st Contact: Dr. Sherrine Maples Pager:[phone] 2nd Contact: Dr. Everardo Beals Pager:[phone]    After Hours (after 5PM)/ Weekend / Holidays: First Contact:              Pager: [phone] Second Contact:         Pager: [phone] _________________________________________________________   Chief Complaint: Fatigue  History of Present Illness:  Mr. Mudrick is a 72 year old gentleman with a history of chronic kidney disease, amyloidosis, hypertension, tobacco abuse, who presents with fatigue and shortness of breath over the past 4-5 days. Also complains of dysuria, polyuria and polydipsia. He states that he has felt progressively more "sluggish" over the past week or 2. He endorses a cough, white sputum production, fever, chills, dizziness.  Patient denies shortness of breath, nausea, vomiting, chest pain, lower extremity edema, hematuria, hematochezia, melena. He denies any recent falls area and denies numbness or tingling. Denies focal weakness. Denies any weight loss over the past several months.  Bladder scan in ED with over 300 cc, postvoid is 177, foley placed.  Social history: Patient smokes half-pack cigarettes per day, denies any alcohol or drug use  Lives at home with his wife, helps with chores, groceries. Independent in ADLs, no baseline mobility issues. Does not use a walker or assistive device. Meds:   Medication List    ASK your doctor about these medications       amLODipine 5 MG tablet  Commonly known as:  NORVASC  Take 5 mg by mouth daily.     FLOMAX 0.4 MG  Caps  Generic drug:  tamsulosin  Take 0.4 mg by mouth 2 (two) times daily.     metoprolol succinate 50 MG 24 hr tablet  Commonly known as:  TOPROL-XL  Take 25 mg by mouth daily. Take with or immediately following a meal.        Allergies: Allergies as of 02/08/2013  . (No Known Allergies)   Past Medical History  Diagnosis Date  . 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 Hypertension   . Incarcerated inguinal hernia, unilateral     right side  . Tobacco abuse   . Varicocele   . Cancer     Renal   Past Surgical History  Procedure Laterality Date  . Av fistula placement, radiocephalic  01/30/2011    Right w/ ligation of competing venous branch by Dr. Leonides Sake  . Av fistula placement, brachiocephalic  10/19/2010    LEFT  . Av fistula placement, radiocephalic  09/13/2010    LEFT   Family History  Problem Relation Age of Onset  . Asthma Mother   . Alcohol abuse Father    History   Social History  . Marital Status: Married    Spouse Name: N/A    Number of Children: N/A  . Years of Education: N/A   Occupational History  . Not on file.   Social History Main Topics  . Smoking status: Current Every Day Smoker -- 1.00 packs/day for 50 years    Types: Cigarettes  . Smokeless tobacco: Not on file  . Alcohol Use: No  .  Drug Use: No  . Sexually Active: Not on file   Other Topics Concern  . Not on file   Social History Narrative  . No narrative on file    Review of Systems: Pertinent items noted in HPI   Physical Exam Blood pressure 152/92, pulse 73, temperature 99.7 F (37.6 C), temperature source Oral, resp. rate 28, SpO2 95.00%. General:  No acute distress, alert and oriented x 3, appears older than stated age HEENT:  PERRL, EOMI, very dry mucous membranes, oropharynx clear, no lesions Cardiovascular:  Regular rate and rhythm, no murmurs, rubs or gallops Respiratory:  Expiratory wheezes noted bilaterally, decreased  breath sounds throughout, occasional crackles at the bases Abdomen:  Soft, nondistended, nontender, bowel sounds present Extremities:  Warm and well-perfused, no edema. Scattered areas of hypopigmentation, chronic. AV fistula of the left upper extremity noted, no thrill, no bruit Skin: Warm, dry, no rashes Neuro: Cranial nerves tested and intact, strength is 5 out of 5 throughout, downgoing Babinski's bilaterally, sensation to light touch intact throughout, gait testing deferred  Lab results: Basic Metabolic Panel:  Recent Labs  95/28/41 2029  NA 141  K 4.0  CL 104  CO2 28  GLUCOSE 92  BUN 31*  CREATININE 4.05*  CALCIUM 8.8   CBC:  Recent Labs  02/08/13 2029  WBC 7.1  NEUTROABS 4.9  HGB 15.2  HCT 43.4  MCV 93.3  PLT 158   Cardiac Enzymes:  Recent Labs  02/08/13 2029  TROPONINI <0.30   BNP:  Recent Labs  02/08/13 2029  PROBNP 1411.0*   D-Dimer: No results found for this basename: DDIMER,  in the last 72 hours CBG:  Recent Labs  02/08/13 2130  GLUCAP 89   Urinalysis:  Recent Labs  02/08/13 2120  COLORURINE YELLOW  LABSPEC 1.015  PHURINE 6.5  GLUCOSEU NEGATIVE  HGBUR MODERATE*  BILIRUBINUR NEGATIVE  KETONESUR NEGATIVE  PROTEINUR >300*  UROBILINOGEN 1.0  NITRITE NEGATIVE  LEUKOCYTESUR NEGATIVE     Imaging results:  Dg Chest 2 View  02/08/2013  *RADIOLOGY REPORT*  Clinical Data: 50  CHEST - 2 VIEW  Comparison: Prior chest x-ray 01/30/2011  Findings: Interval enlargement in the size of the cardiopericardial silhouette.  Query mild left atrial enlargement.  There is new pulmonary vascular congestion.  There is a 1.8 cm nodular opacity projecting over the left base.  A smaller 8 mm nodular opacity projecting over the left base may represent a prominent nipple shadow.  No acute osseous abnormality.  IMPRESSION:  1.  New cardiomegaly with probable left atrial enlargement and increased pulmonary vascular congestion compared to 01/30/2011. Findings  suggest early mild CHF.  2.  New right basilar 18 mm nodular opacity concerning for pulmonary nodule.  Recommend further evaluation with dedicated CT scan of the chest.  3.  Nodular opacity in the left base favored to reflect a prominent nipple shadow.   Original Report Authenticated By: Malachy Moan, M.D.      Assessment & Plan by Problem: Active Problems:   Generalized weakness   Tobacco abuse   Acute on chronic renal failure   Hypertension  Generalized weakness with decreased appetite Patient presents with one to 2 weeks of fatigue, unclear etiology. Also reports dizziness and decreased appetite. Strength is 5 out of 5 on neuro exam. Perhaps this is secondary to acute renal injury with uremia, though BUN is only 31. He does not have a white count. Patient does appear clinically dry on exam but with evidence of pulmonary edema  and new cardiomegaly on admission CXR 2 view.  It is possible this patient has undiagnosed COPD and this is a COPD exacerbation.  -admit to telemetry -2-D ECHO -albuterol and atrovent nebulizers q6 -check orthostatics -check LFTs, TSH, B12 -PT/OT -daily weights, Is and Os -DC foley when possible -EKG in AM  Acute on chronic renal failure Patient has a history of amyloidosis and associated renal disease. His baseline creatinine is not known. He has had an AV fistula placed in 2012 which does not appear to be patent at this time. Creatinine on admission is 4.05, BUN is 31. Potassium 4.0. Hemoglobin 15.2 -Will need to obtain records from patient's nephrologist at [facility] in AM -follow renal function   Pulmonary nodule Patient was noted to have an 18 mm right basilar pulmonary nodule on chest x-ray. Patient is a long-time smoker and is at risk for lung cancer.  -Will need a followup CT at some point.  Hypertension Patient has a history of hypertension, he is on home amlodipine and toprol. His blood pressure on admission is 152/92. -Continue home  Norvasc and toprol  Ketonuria and hematuria Patient has ketones and RBCs on UA. Ketones in his urine are not new. 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 -Repeat UA in the morning  BPH -continue home flomax -dc foley as soon as possible  DVT PPX -heparin  Diet -Renal  Disposition -Anticipate discharge in one to 2 days -Patient has a PCP and it is Dr. Burnell Blanks in [facility] -Nephrologist is Dr. Darrick Penna and [facility]   Signed: Denton Ar 02/08/2013, 11:07 PM

## 2013-02-08 NOTE — ED Provider Notes (Signed)
History     CSN: [id number]  Arrival date & time 02/08/13  1959   First MD Initiated Contact with Patient 02/08/13 2028      Chief Complaint  Patient presents with  . Fatigue     HPI The patient reports increased fatigue as well as some cough with shortness of breath over the past 4-5 days.  He reports polyuria and polydipsia.  No dysuria or urinary frequency.  He has a known history of prostatic enlargement.  He denies a prior history of congestive heart failure.  No chest pain.  Denies abdominal pain.  No nausea vomiting or diarrhea.  No flank pain.  Family reports no altered mental status.  They report decreased oral intake over the past several days.   Past Medical History  Diagnosis Date  . 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 Hypertension   . Incarcerated inguinal hernia, unilateral     right side  . Tobacco abuse   . Varicocele   . Cancer     Renal    Past Surgical History  Procedure Laterality Date  . Av fistula placement, radiocephalic  01/30/2011    Right w/ ligation of competing venous branch by Dr. Leonides Sake  . Av fistula placement, brachiocephalic  10/19/2010    LEFT  . Av fistula placement, radiocephalic  09/13/2010    LEFT    Family History  Problem Relation Age of Onset  . Asthma Mother   . Alcohol abuse Father     History  Substance Use Topics  . Smoking status: Current Every Day Smoker -- 1.00 packs/day for 50 years    Types: Cigarettes  . Smokeless tobacco: Not on file  . Alcohol Use: No      Review of Systems  All other systems reviewed and are negative.    Allergies  Review of patient's allergies indicates no known allergies.  Home Medications   Current Outpatient Rx  Name  Route  Sig  Dispense  Refill  . amLODipine (NORVASC) 5 MG tablet   Oral   Take 5 mg by mouth daily.         . metoprolol succinate (TOPROL-XL) 50 MG 24 hr tablet   Oral   Take 25 mg by mouth daily. Take with or  immediately following a meal.         . Tamsulosin HCl (FLOMAX) 0.4 MG CAPS   Oral   Take 0.4 mg by mouth 2 (two) times daily.            BP 152/92  Pulse 73  Temp(Src) 99.7 F (37.6 C) (Oral)  Resp 28  SpO2 95%  Physical Exam  Nursing note and vitals reviewed. Constitutional: He is oriented to person, place, and time. He appears well-developed and well-nourished.  HENT:  Head: Normocephalic and atraumatic.  Eyes: EOM are normal.  Neck: Normal range of motion.  Cardiovascular: Normal rate, regular rhythm, normal heart sounds and intact distal pulses.   Pulmonary/Chest: Effort normal and breath sounds normal. No respiratory distress.  Abdominal: Soft. He exhibits no distension. There is no tenderness.  Genitourinary: Rectum normal.  Musculoskeletal: Normal range of motion.  Neurological: He is alert and oriented to person, place, and time.  Skin: Skin is warm and dry.  Psychiatric: He has a normal mood and affect. Judgment normal.    ED Course  Procedures (including critical care time)   Date: 02/08/2013  Rate: 67  Rhythm: normal  sinus rhythm  QRS Axis: normal  Intervals: normal  ST/T Wave abnormalities: normal  Conduction Disutrbances: Incomplete right bundle branch block  Narrative Interpretation: Sinus arrhythmia  Old EKG Reviewed: No significant changes noted     Labs Reviewed  CBC WITH DIFFERENTIAL - Abnormal; Notable for the following:    Lymphocytes Relative 11 (*)    Monocytes Relative 19 (*)    Monocytes Absolute 1.3 (*)    All other components within normal limits  BASIC METABOLIC PANEL - Abnormal; Notable for the following:    BUN 31 (*)    Creatinine, Ser 4.05 (*)    GFR calc non Af Amer 14 (*)    GFR calc Af Amer 16 (*)    All other components within normal limits  PRO B NATRIURETIC PEPTIDE - Abnormal; Notable for the following:    Pro B Natriuretic peptide (BNP) 1411.0 (*)    All other components within normal limits  URINALYSIS, ROUTINE  W REFLEX MICROSCOPIC - Abnormal; Notable for the following:    Hgb urine dipstick MODERATE (*)    Protein, ur >300 (*)    All other components within normal limits  URINE MICROSCOPIC-ADD ON - Abnormal; Notable for the following:    Casts GRANULAR CAST (*)    All other components within normal limits  TROPONIN I  GLUCOSE, CAPILLARY   Dg Chest 2 View  02/08/2013  *RADIOLOGY REPORT*  Clinical Data: 50  CHEST - 2 VIEW  Comparison: Prior chest x-ray 01/30/2011  Findings: Interval enlargement in the size of the cardiopericardial silhouette.  Query mild left atrial enlargement.  There is new pulmonary vascular congestion.  There is a 1.8 cm nodular opacity projecting over the left base.  A smaller 8 mm nodular opacity projecting over the left base may represent a prominent nipple shadow.  No acute osseous abnormality.  IMPRESSION:  1.  New cardiomegaly with probable left atrial enlargement and increased pulmonary vascular congestion compared to 01/30/2011. Findings suggest early mild CHF.  2.  New right basilar 18 mm nodular opacity concerning for pulmonary nodule.  Recommend further evaluation with dedicated CT scan of the chest.  3.  Nodular opacity in the left base favored to reflect a prominent nipple shadow.   Original Report Authenticated By: Malachy Moan, M.D.    I personally reviewed the imaging tests through PACS system I reviewed available ER/hospitalization records through the EMR   1. Renal failure   2. Dehydration   3. Urinary retention   4. CHF (congestive heart failure)       MDM  This is difficult because his chest x-rays BNP would suggest he has some degree of congestive heart failure but intravascularly the Gyne-Lotrimin extremely dehydrated.  His tongue is extremely dry and shriveled.  I give at 2 L of IV fluids here her shortness of breath is not worsened.  He does also appear to have urinary retention.  Bladder scan after urination showed approximately 180 cc of urine.  I  think this may be affecting his renal function and therefore a vas the Foley catheter be placed.  Urine shows no signs of infection.  The patient be admitted the hospital for ongoing workup regarding her shortness of breath and his elevated BNP.  He also will benefit from Foley catheter placement a repeat evaluation of his BUN and creatinine in the morning.  Patient is agreeable to observational stay        Lyanne Co, MD 02/08/13 2311

## 2013-02-09 ENCOUNTER — Observation Stay (HOSPITAL_COMMUNITY): Payer: Medicare Other

## 2013-02-09 ENCOUNTER — Encounter (HOSPITAL_COMMUNITY): Payer: Self-pay | Admitting: Nurse Practitioner

## 2013-02-09 DIAGNOSIS — Z720 Tobacco use: Secondary | ICD-10-CM | POA: Diagnosis present

## 2013-02-09 DIAGNOSIS — N189 Chronic kidney disease, unspecified: Secondary | ICD-10-CM | POA: Diagnosis present

## 2013-02-09 DIAGNOSIS — N19 Unspecified kidney failure: Secondary | ICD-10-CM

## 2013-02-09 DIAGNOSIS — I10 Essential (primary) hypertension: Secondary | ICD-10-CM | POA: Diagnosis present

## 2013-02-09 LAB — CBC
Hemoglobin: 13.5 g/dL (ref 13.0–17.0)
MCV: 94 fL (ref 78.0–100.0)
Platelets: 136 10*3/uL — ABNORMAL LOW (ref 150–400)
RBC: 4.17 MIL/uL — ABNORMAL LOW (ref 4.22–5.81)
WBC: 5.8 10*3/uL (ref 4.0–10.5)

## 2013-02-09 LAB — BASIC METABOLIC PANEL
CO2: 24 mEq/L (ref 19–32)
Calcium: 8.1 mg/dL — ABNORMAL LOW (ref 8.4–10.5)
Potassium: 3.6 mEq/L (ref 3.5–5.1)
Sodium: 139 mEq/L (ref 135–145)

## 2013-02-09 LAB — TSH: TSH: 0.517 u[IU]/mL (ref 0.350–4.500)

## 2013-02-09 LAB — SODIUM, URINE, RANDOM: Sodium, Ur: 67 mEq/L

## 2013-02-09 LAB — HEPATIC FUNCTION PANEL
Albumin: 2.7 g/dL — ABNORMAL LOW (ref 3.5–5.2)
Total Bilirubin: 0.6 mg/dL (ref 0.3–1.2)
Total Protein: 5.5 g/dL — ABNORMAL LOW (ref 6.0–8.3)

## 2013-02-09 LAB — URINALYSIS, MICROSCOPIC ONLY
Bilirubin Urine: NEGATIVE
Glucose, UA: 100 mg/dL — AB
Ketones, ur: 15 mg/dL — AB
Protein, ur: >300 mg/dL — AB
pH: 6 (ref 5.0–8.0)

## 2013-02-09 LAB — VITAMIN B12: Vitamin B-12: 226 pg/mL (ref 211–911)

## 2013-02-09 LAB — CREATININE, URINE, RANDOM: Creatinine, Urine: 180.53 mg/dL

## 2013-02-09 LAB — OSMOLALITY, URINE: Osmolality, Ur: 475 mOsm/kg (ref 390–1090)

## 2013-02-09 MED ORDER — AMLODIPINE BESYLATE 5 MG PO TABS
5.0000 mg | ORAL_TABLET | Freq: Every day | ORAL | Status: DC
  Administered 2013-02-09 – 2013-02-11 (×3): 5 mg via ORAL
  Filled 2013-02-09 (×4): qty 1

## 2013-02-09 MED ORDER — ALBUTEROL SULFATE (5 MG/ML) 0.5% IN NEBU
2.5000 mg | INHALATION_SOLUTION | Freq: Three times a day (TID) | RESPIRATORY_TRACT | Status: DC
  Administered 2013-02-10: 2.5 mg via RESPIRATORY_TRACT
  Filled 2013-02-09: qty 0.5

## 2013-02-09 MED ORDER — SODIUM CHLORIDE 0.9 % IJ SOLN
3.0000 mL | Freq: Two times a day (BID) | INTRAMUSCULAR | Status: DC
  Administered 2013-02-09 – 2013-02-11 (×6): 3 mL via INTRAVENOUS

## 2013-02-09 MED ORDER — METOPROLOL SUCCINATE ER 25 MG PO TB24
25.0000 mg | ORAL_TABLET | Freq: Every day | ORAL | Status: DC
  Administered 2013-02-09 – 2013-02-11 (×3): 25 mg via ORAL
  Filled 2013-02-09 (×3): qty 1

## 2013-02-09 MED ORDER — TAMSULOSIN HCL 0.4 MG PO CAPS
0.4000 mg | ORAL_CAPSULE | Freq: Two times a day (BID) | ORAL | Status: DC
  Administered 2013-02-09 – 2013-02-11 (×5): 0.4 mg via ORAL
  Filled 2013-02-09 (×9): qty 1

## 2013-02-09 MED ORDER — IPRATROPIUM BROMIDE 0.02 % IN SOLN
0.5000 mg | Freq: Three times a day (TID) | RESPIRATORY_TRACT | Status: DC
  Administered 2013-02-10: 0.5 mg via RESPIRATORY_TRACT
  Filled 2013-02-09: qty 2.5

## 2013-02-09 MED ORDER — HEPARIN SODIUM (PORCINE) 5000 UNIT/ML IJ SOLN
5000.0000 [IU] | Freq: Three times a day (TID) | INTRAMUSCULAR | Status: DC
  Administered 2013-02-09 – 2013-02-11 (×6): 5000 [IU] via SUBCUTANEOUS
  Filled 2013-02-09 (×9): qty 1

## 2013-02-09 MED ORDER — IPRATROPIUM BROMIDE 0.02 % IN SOLN
0.5000 mg | Freq: Four times a day (QID) | RESPIRATORY_TRACT | Status: DC
  Administered 2013-02-09 (×4): 0.5 mg via RESPIRATORY_TRACT
  Filled 2013-02-09 (×4): qty 2.5

## 2013-02-09 MED ORDER — IOHEXOL 300 MG/ML  SOLN
25.0000 mL | INTRAMUSCULAR | Status: AC
  Administered 2013-02-09 (×2): 25 mL via ORAL

## 2013-02-09 MED ORDER — SODIUM CHLORIDE 0.9 % IV SOLN: INTRAVENOUS | Status: DC

## 2013-02-09 MED ORDER — ALBUTEROL SULFATE (5 MG/ML) 0.5% IN NEBU
2.5000 mg | INHALATION_SOLUTION | Freq: Four times a day (QID) | RESPIRATORY_TRACT | Status: DC
  Administered 2013-02-09 (×4): 2.5 mg via RESPIRATORY_TRACT
  Filled 2013-02-09 (×4): qty 0.5

## 2013-02-09 NOTE — Evaluation (Signed)
Physical Therapy Evaluation Patient Details Name: Shane [name] MRN: [id number] DOB: 1940/10/31 Today's Date: 02/09/2013 Time: 1120-1140 PT Time Calculation (min): 20 min  PT Assessment / Plan / Recommendation Clinical Impression  [name] is a 72yo man with PMH of CKD, amyloidosis, HTN and tobacco abuse who presented to the ED complaining of fatigue, feeling sluggish for about 1-2 weeks and SOB over the past week.  Pt continues to be fatigued limiting overall mobility therefore will benefit from acute PT services to improve overall mobility and prepare for safe d/c home with HHPT.  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  Wife was evaluated at home for PT needs however pt may benefit from [facility] for wife.    PT Assessment  Patient needs continued PT services    Follow Up Recommendations  Home health PT;Other (comment) (Wife evaluated for HHPT services may benefit from [facility] )    Does the patient have the potential to tolerate intense rehabilitation      Barriers to Discharge  (Caregiver for wife)      Equipment Recommendations  None recommended by PT    Recommendations for Other Services     Frequency Min 3X/week    Precautions / Restrictions Precautions Precautions: Fall   Pertinent Vitals/Pain Pt c/o pain with urination into catheter but does not rate and RN aware      Mobility  Bed Mobility Bed Mobility: Not assessed Transfers Transfers: Sit to Stand;Stand to Sit Sit to Stand: 4: Min guard;From chair/3-in-1 Stand to Sit: 4: Min assist;To chair/3-in-1 Details for Transfer Assistance: (A) to slowly descend to recliner with cues for hand placement Ambulation/Gait Ambulation/Gait Assistance: 4: Min guard Ambulation Distance (Feet): 20 Feet Assistive device: None Ambulation/Gait Assistance Details: minguard for safety.  Pt needed intermittent standing breaks due to overall fatigue Gait Pattern: Step-through pattern;Shuffle Gait velocity: decreased     Exercises     PT Diagnosis: Generalized weakness;Difficulty walking;Acute pain  PT Problem List: Decreased activity tolerance;Decreased mobility;Decreased knowledge of use of DME;Pain PT Treatment Interventions: DME instruction;Gait training;Functional mobility training;Therapeutic activities;Therapeutic exercise;Patient/family education;Balance training   PT Goals Acute Rehab PT Goals PT Goal Formulation: With patient Time For Goal Achievement: 02/16/13 Potential to Achieve Goals: Good Pt will go Sit to Stand: with modified independence PT Goal: Sit to Stand - Progress: Goal set today Pt will go Stand to Sit: with modified independence PT Goal: Stand to Sit - Progress: Goal set today Pt will Ambulate: >150 feet;with least restrictive assistive device;with supervision PT Goal: Ambulate - Progress: Goal set today  Visit Information  Last PT Received On: 02/09/13 Assistance Needed: +1    Subjective Data  Subjective: "I'll get better to go home and take care of my wife." Patient Stated Goal: To get stronger to go home and care for wife   Prior Functioning  Home Living Lives With: Spouse;Daughter Available Help at Discharge:  (Pt cares for wife with cerebellar ataxia & dtr down syndrome) Type of Home: House Home Access: Ramped entrance Home Layout: One level Bathroom Shower/Tub: Engineer, manufacturing systems: Standard Bathroom Accessibility: Yes How Accessible: Accessible via wheelchair Home Adaptive Equipment: Bedside commode/3-in-1;Shower chair with back Prior Function Level of Independence: Independent Able to Take Stairs?: Yes Driving: No Vocation: Retired Comments: Neighbors drives pt to Physicist, medical Communication: No difficulties Dominant Hand: Right    Cognition  Cognition Arousal/Alertness: Awake/alert    Extremity/Trunk Assessment Right Lower Extremity Assessment RLE ROM/Strength/Tone: Within functional levels Left  Lower  Extremity Assessment LLE ROM/Strength/Tone: Within functional levels   Balance    End of Session PT - End of Session Equipment Utilized During Treatment: Gait belt Activity Tolerance: Patient limited by fatigue Patient left: in chair;with call bell/phone within reach Nurse Communication: Mobility status;Patient requests pain meds  GP     Vicie Cech 02/09/2013, 1:10 PM [place], PT DPT [phone]

## 2013-02-09 NOTE — Progress Notes (Signed)
S: Went to evaluate pt for concern of respiratory status after CT scan. Pt was doing well. Voided after removal of foley with <100cc residual per nurse. Pt denied any pain and was comfortable watching TV.    O: Filed Vitals:   02/09/13 1407  BP: 142/78  Pulse: 66  Temp: 98.2 F (36.8 C)  Resp: 20   General: resting in bed, NAD HEENT: PERRL, EOMI, no scleral icterus Cardiac: RRR, no rubs, murmurs or gallops Pulm: bilateral expiratory wheezes Abd: soft, nontender, nondistended, BS present Ext: warm and well perfused, no pedal edema Neuro: alert and oriented X3, cranial nerves II-XII grossly intact  A/p: pt refused to have neb treatment at this time, O2 Sat 100% on RA, with some crackles.  -IVF TKO -cont to monitor  Christen Bame, MD PGY-1 Pgr: [phone]

## 2013-02-09 NOTE — Consult Note (Signed)
Reason for Consult:AKI/CKD Referring Physician: Criselda Peaches, MD  Shane [name] is an 72 y.o. male.  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  He missed his last office visit 01/30/13 and did not go to his monthly labs either.  He presented to [facility] with 1 week h/o fatigue, weakness, and malaise.  He has also had chills, cough prod of white sputum, DOE, unsteady gait, and uncontrolled shaking/tremors.  His daughter reports that he stayed in bed all day Saturday.  Patient denies shortness of breath, nausea, vomiting, chest pain, lower extremity edema, hematuria, hematochezia, melena. He denies any recent falls area and denies numbness or tingling. Denies focal weakness. Denies any weight loss over the past several months.  We were asked to evaluate the patient due to his AKI/CKD with an admission Scr of 4.05.  The trend in Scr is seen below.  His most recent Scr level was 3.52 on 12/26/12.    Today he has noted gross hematuria and dysuria after I&O cath this morning.  Denies any NSAIDs/COX-II I's but did take alka seltzer cold and sinus yesterday.   Trend in Creatinine: Creatinine, Ser  Date/Time Value Range Status  02/09/2013  5:50 AM 3.88* 0.50 - 1.35 mg/dL Final  10/14/1094  0:45 PM 4.05* 0.50 - 1.35 mg/dL Final  4/0/9811  9:14 AM 2.91* 0.40 - 1.50 mg/dL Final  04/16/2955  2:13 AM 2.9* 0.4 - 1.5 mg/dL Final  0/86/5784 69:62 AM 2.85* 0.40 - 1.50 mg/dL Final  95/28/4132 44:01 AM 3.00* 0.40 - 1.50 mg/dL Final  02/72/5366  4:40 AM 2.97* 0.40 - 1.50 mg/dL Final  34/74/2595  6:38 AM 3.03* 0.40 - 1.50 mg/dL Final  75/64/3329 51:88 AM 2.57* 0.40 - 1.50 mg/dL Final  41/03/6062  0:16 AM 2.73* 0.40 - 1.50 mg/dL Final  0/07/9322  5:57 AM 2.71* 0.40 - 1.50 mg/dL Final  12/28/252  2:70 AM 4.01* 0.4 - 1.5 mg/dL Final  04/01/7627  3:15 AM 3.45*  0.40 - 1.50 mg/dL Final  1/76/1607 37:10 AM 3.38* 0.40 - 1.50 mg/dL Final  03/11/6947  5:46 AM 2.92* 0.40 - 1.50 mg/dL Final  2/70/3500  9:38 AM 4.50* 0.40 - 1.50 mg/dL Final  1/82/9937  1:69 PM 3.15* 0.40 - 1.50 mg/dL Final  6/78/9381  0:17 AM 2.98* 0.40 - 1.50 mg/dL Final  02/06/257 52:77 AM 3.68* 0.40 - 1.50 mg/dL Final  05/10/4234 36:14 AM 3.76* 0.40 - 1.50 mg/dL Final  4/31/5400  8:67 PM 3.82* 0.40 - 1.50 mg/dL Final  03/27/5092 26:71 PM 4.44* 0.40 - 1.50 mg/dL Final  11/13/5807 98:33 PM 3.24* 0.40 - 1.50 mg/dL Final  05/10/5052  9:76 PM 2.78* 0.40 - 1.50 mg/dL Final  7/34/1937  9:02 AM 2.67* 0.4 - 1.5 mg/dL Final  01/16/7352  2:99 AM 2.72* 0.4 - 1.5 mg/dL Final  24/11/6832 19:62 AM 1.6*  Final    PMH:   Past Medical History  Diagnosis Date  . 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 Hypertension   . Incarcerated inguinal hernia, unilateral     right side  . Tobacco abuse   . Varicocele   . Cancer     Renal    PSH:   Past Surgical History  Procedure Laterality Date  . Av fistula placement, radiocephalic  01/30/2011    Right w/ ligation of competing venous branch by Dr. Leonides Sake  . Av fistula placement, brachiocephalic  10/19/2010    LEFT  . Av fistula placement, radiocephalic  09/13/2010    LEFT    Allergies: No Known Allergies  Medications:   Prior to Admission medications   Medication Sig Start Date End Date Taking? Authorizing Provider  amLODipine (NORVASC) 5 MG tablet Take 5 mg by mouth daily.   Yes Historical Provider, MD  metoprolol succinate (TOPROL-XL) 50 MG 24 hr tablet Take 25 mg by mouth daily. Take with or immediately following a meal.   Yes Historical Provider, MD  Tamsulosin HCl (FLOMAX) 0.4 MG CAPS Take 0.4 mg by mouth 2 (two) times daily.    Yes Historical Provider, MD    Inpatient medications: . ipratropium  0.5 mg Nebulization Q6H   And  . albuterol  2.5 mg Nebulization Q6H  . amLODipine  5 mg Oral Daily  .  heparin  5,000 Units Subcutaneous Q8H  . metoprolol succinate  25 mg Oral Daily  . sodium chloride  3 mL Intravenous Q12H  . tamsulosin  0.4 mg Oral BID    Discontinued Meds:   Medications Discontinued During This Encounter  Medication Reason  . oxyCODONE (OXY IR/ROXICODONE) 5 MG immediate release tablet No longer needed (for PRN medications)  . amLODipine (NORVASC) 10 MG tablet Dose change  . furosemide (LASIX) 80 MG tablet Patient has not taken in last 30 days  . HYDROcodone-acetaminophen (VICODIN) 5-500 MG per tablet Patient has not taken in last 30 days  . metoprolol (TOPROL-XL) 50 MG 24 hr tablet Duplicate  . prochlorperazine (COMPAZINE) 10 MG tablet No longer needed (for PRN medications)  . METOPROLOL SUCCINATE PO Entry Error  . 0.9 %  sodium chloride infusion     Social History:  reports that he has been smoking Cigarettes.  He has a 25 pack-year smoking history. He has never used smokeless tobacco. He reports that he does not drink alcohol or use illicit drugs.  Family History:   Family History  Problem Relation Age of Onset  . Asthma Mother   . Alcohol abuse Father     Pertinent items are noted in HPI. Weight change:   Intake/Output Summary (Last 24 hours) at 02/09/13 1424 Last data filed at 02/09/13 1200  Gross per 24 hour  Intake    240 ml  Output    950 ml  Net   -710 ml    General appearance: fatigued with intermittent rigors Head: Normocephalic, without obvious abnormality, atraumatic Eyes: negative findings: lids and lashes normal, conjunctivae and sclerae normal and corneas clear Neck: no adenopathy, no carotid bruit, no JVD, supple, symmetrical, trachea midline and thyroid not enlarged, symmetric, no tenderness/mass/nodules Resp: rhonchi bilaterally and wheezes bilaterally Cardio: regular rate and rhythm, S1, S2 normal, no murmur, click, rub or gallop GI: soft, non-tender; bowel sounds normal; no masses,  no organomegaly Extremities: edema trace pretib  edema Neuro: A A O X 3 but +asterixis  Labs: Basic Metabolic Panel:  Recent Labs Lab 02/08/13 2029 02/09/13 0550  NA 141 139  K 4.0 3.6  CL 104 105  CO2 28 24  GLUCOSE 92 86  BUN 31* 31*  CREATININE 4.05* 3.88*  ALBUMIN  --  2.7*  CALCIUM 8.8 8.1*   Liver Function Tests:  Recent Labs Lab 02/09/13 0550  AST 15  ALT <5  ALKPHOS 49  BILITOT 0.6  PROT 5.5*  ALBUMIN 2.7*  No results found for this basename: LIPASE, AMYLASE,  in the last 168 hours No results found for this basename: AMMONIA,  in the last 168 hours CBC:  Recent Labs Lab 02/08/13 2029 02/09/13 0550  WBC 7.1 5.8  NEUTROABS 4.9  --   HGB 15.2 13.5  HCT 43.4 39.2  MCV 93.3 94.0  PLT 158 136*   PT/INR: @labrcntip (inr:5) Cardiac Enzymes:  Recent Labs Lab 02/08/13 2029  TROPONINI <0.30   CBG:  Recent Labs Lab 02/08/13 2130  GLUCAP 89    Iron Studies: No results found for this basename: IRON, TIBC, TRANSFERRIN, FERRITIN,  in the last 168 hours  Xrays/Other Studies: Dg Chest 2 View  02/08/2013  *RADIOLOGY REPORT*  Clinical Data: 70  CHEST - 2 VIEW  Comparison: Prior chest x-ray 01/30/2011  Findings: Interval enlargement in the size of the cardiopericardial silhouette.  Query mild left atrial enlargement.  There is new pulmonary vascular congestion.  There is a 1.8 cm nodular opacity projecting over the left base.  A smaller 8 mm nodular opacity projecting over the left base may represent a prominent nipple shadow.  No acute osseous abnormality.  IMPRESSION:  1.  New cardiomegaly with probable left atrial enlargement and increased pulmonary vascular congestion compared to 01/30/2011. Findings suggest early mild CHF.  2.  New right basilar 18 mm nodular opacity concerning for pulmonary nodule.  Recommend further evaluation with dedicated CT scan of the chest.  3.  Nodular opacity in the left base favored to reflect a prominent nipple shadow.   Original Report Authenticated By: Malachy Moan, M.D.       Assessment/Plan: 1.  AKI/CKD- pt with decreased po intake for several days and may be volume depleted.  Not significantly different from his baseline and trending down.  Would gently hydrate and follow.  Will check urine studies.  Had some urinary retention s/p I&O cath with trauma and gross hematuria/dysuria today.  Will follow.  No [place] as he has CT of abd and pelvis today and results pending.   2. Rigors- possible infection from urinary source.  Will check urine cx as well as blood cx's X2.  tmax of 99.7 but was also taking cold and sinus meds yesterday. 3. Weakness/malaise- as above.  Possible infection without clear source.  worrisome for PNA or UTI.  cx's sent today and imaging studies pending 4. New right basilar nodular opacity worrisome for pulm nodule.  W/u underway 5. SOB/Hypoxia- on O2 and has longstanding tobacco history, as well as new infiltrate/nodule.  ?early PNA vs malignancy. W/u underway 6. HTN- not at goal. Cont with outpt meds and follow 7. H/o AL amyloidosis- normal Hgb 8. Early satiety and anorexia- consider GI eval 9. Asterixis- will check ABG and NH3 as his renal function is not significantly different from baseline.  No mention of gabapentin as outpt 10. New cardiomegaly and ?LAE- w/u started per primary svc. He does have h/o amyloidosis.  Consider ECHO to evaluate EF and myocardium for possible amyloid deposition 11. Protein malnutrition- follow 12. Urinary retention- ?anti-cholinergic effects of OTC cold and sinus meds.  Pt does have h/o BPH and was on flomax PTA 13. Vascular access- he has clotted 3 fistulas over the last several years.  F/u with [facility].   Renzo Vincelette A 02/09/2013, 2:24 PM

## 2013-02-09 NOTE — ED Notes (Signed)
Contact son Shaan [phone] cell, home [phone].

## 2013-02-09 NOTE — H&P (Signed)
Internal Medicine Teaching Service Attending Note Date: 02/09/2013  Patient name: Shane [name]  Medical record number: [id number]  Date of birth: April 05, 1941   I have seen and evaluated Shane [name] and discussed their care with the Residency Team.    Shane [name] is a 72yo man with PMH of CKD, amyloidosis, HTN and tobacco abuse who presented to the ED complaining of fatigue, feeling sluggish for about 1-2 weeks and SOB over the past week.  He is chronically short of breath, but this seemed a little worse.  He reports chills as well for the past 2 weeks, night sweats X 1 about a week ago and a decreased appetite for the past 2 weeks.  Further associated symptoms include vague dizziness, cough and a whitish sputum.  In the ED, he was noted to have hematuria and a post void residual of 177, so foley catheter was placed.    He denies any other symptoms including inability to take PO, nausea, vomiting, chest pain, LE edema, melena.  Denies focal weakness, falling and weight loss.    For further medical history, please see resident note.  He has a documented history of renal cancer, but he has no knowledge of this.   Physical Exam: Blood pressure 150/86, pulse 78, temperature 98.9 F (37.2 C), temperature source Oral, resp. rate 20, height 5\' 11"  (1.803 m), weight 161 lb 3.2 oz (73.12 kg), SpO2 95.00%. General appearance: alert, cooperative and appears stated age Head: Normocephalic, without obvious abnormality, atraumatic Eyes: EOMI, anicteric sclerae Lungs: clear to auscultation bilaterally and no wheezing noted on exam Heart: RR, NR, no murmur Abdomen: soft, NT, +BS Extremities: no edema Skin: chronic discoloration of LE bilaterally GU: Foley in place draining brownish/red urine  Lab results: Results for orders placed during the hospital encounter of 02/08/13 (from the past 24 hour(s))  CBC WITH DIFFERENTIAL     Status: Abnormal   Collection Time    02/08/13  8:29 PM      Result Value  Range   WBC 7.1  4.0 - 10.5 K/uL   RBC 4.65  4.22 - 5.81 MIL/uL   Hemoglobin 15.2  13.0 - 17.0 g/dL   HCT 11.9  14.7 - 82.9 %   MCV 93.3  78.0 - 100.0 fL   MCH 32.7  26.0 - 34.0 pg   MCHC 35.0  30.0 - 36.0 g/dL   RDW 56.2  13.0 - 86.5 %   Platelets 158  150 - 400 K/uL   Neutrophils Relative 69  43 - 77 %   Neutro Abs 4.9  1.7 - 7.7 K/uL   Lymphocytes Relative 11 (*) 12 - 46 %   Lymphs Abs 0.8  0.7 - 4.0 K/uL   Monocytes Relative 19 (*) 3 - 12 %   Monocytes Absolute 1.3 (*) 0.1 - 1.0 K/uL   Eosinophils Relative 0  0 - 5 %   Eosinophils Absolute 0.0  0.0 - 0.7 K/uL   Basophils Relative 0  0 - 1 %   Basophils Absolute 0.0  0.0 - 0.1 K/uL  BASIC METABOLIC PANEL     Status: Abnormal   Collection Time    02/08/13  8:29 PM      Result Value Range   Sodium 141  135 - 145 mEq/L   Potassium 4.0  3.5 - 5.1 mEq/L   Chloride 104  96 - 112 mEq/L   CO2 28  19 - 32 mEq/L   Glucose, Bld 92  70 -  99 mg/dL   BUN 31 (*) 6 - 23 mg/dL   Creatinine, Ser 2.84 (*) 0.50 - 1.35 mg/dL   Calcium 8.8  8.4 - 13.2 mg/dL   GFR calc non Af Amer 14 (*) >90 mL/min   GFR calc Af Amer 16 (*) >90 mL/min  TROPONIN I     Status: None   Collection Time    02/08/13  8:29 PM      Result Value Range   Troponin I <0.30  <0.30 ng/mL  PRO B NATRIURETIC PEPTIDE     Status: Abnormal   Collection Time    02/08/13  8:29 PM      Result Value Range   Pro B Natriuretic peptide (BNP) 1411.0 (*) 0 - 125 pg/mL  URINALYSIS, ROUTINE W REFLEX MICROSCOPIC     Status: Abnormal   Collection Time    02/08/13  9:20 PM      Result Value Range   Color, Urine YELLOW  YELLOW   APPearance CLEAR  CLEAR   Specific Gravity, Urine 1.015  1.005 - 1.030   pH 6.5  5.0 - 8.0   Glucose, UA NEGATIVE  NEGATIVE mg/dL   Hgb urine dipstick MODERATE (*) NEGATIVE   Bilirubin Urine NEGATIVE  NEGATIVE   Ketones, ur NEGATIVE  NEGATIVE mg/dL   Protein, ur >440 (*) NEGATIVE mg/dL   Urobilinogen, UA 1.0  0.0 - 1.0 mg/dL   Nitrite NEGATIVE  NEGATIVE    Leukocytes, UA NEGATIVE  NEGATIVE  URINE MICROSCOPIC-ADD ON     Status: Abnormal   Collection Time    02/08/13  9:20 PM      Result Value Range   Squamous Epithelial / LPF RARE  RARE   RBC / HPF 11-20  <3 RBC/hpf   Bacteria, UA RARE  RARE   Casts GRANULAR CAST (*) NEGATIVE  GLUCOSE, CAPILLARY     Status: None   Collection Time    02/08/13  9:30 PM      Result Value Range   Glucose-Capillary 89  70 - 99 mg/dL  HEPATIC FUNCTION PANEL     Status: Abnormal   Collection Time    02/09/13  5:50 AM      Result Value Range   Total Protein 5.5 (*) 6.0 - 8.3 g/dL   Albumin 2.7 (*) 3.5 - 5.2 g/dL   AST 15  0 - 37 U/L   ALT <5  0 - 53 U/L   Alkaline Phosphatase 49  39 - 117 U/L   Total Bilirubin 0.6  0.3 - 1.2 mg/dL   Bilirubin, Direct 0.2  0.0 - 0.3 mg/dL   Indirect Bilirubin 0.4  0.3 - 0.9 mg/dL  BASIC METABOLIC PANEL     Status: Abnormal   Collection Time    02/09/13  5:50 AM      Result Value Range   Sodium 139  135 - 145 mEq/L   Potassium 3.6  3.5 - 5.1 mEq/L   Chloride 105  96 - 112 mEq/L   CO2 24  19 - 32 mEq/L   Glucose, Bld 86  70 - 99 mg/dL   BUN 31 (*) 6 - 23 mg/dL   Creatinine, Ser 1.02 (*) 0.50 - 1.35 mg/dL   Calcium 8.1 (*) 8.4 - 10.5 mg/dL   GFR calc non Af Amer 14 (*) >90 mL/min   GFR calc Af Amer 17 (*) >90 mL/min  CBC     Status: Abnormal   Collection Time    02/09/13  5:50 AM      Result Value Range   WBC 5.8  4.0 - 10.5 K/uL   RBC 4.17 (*) 4.22 - 5.81 MIL/uL   Hemoglobin 13.5  13.0 - 17.0 g/dL   HCT 40.9  81.1 - 91.4 %   MCV 94.0  78.0 - 100.0 fL   MCH 32.4  26.0 - 34.0 pg   MCHC 34.4  30.0 - 36.0 g/dL   RDW 78.2  95.6 - 21.3 %   Platelets 136 (*) 150 - 400 K/uL  OSMOLALITY, URINE     Status: None   Collection Time    02/09/13  6:31 AM      Result Value Range   Osmolality, Ur 475  390 - 1090 mOsm/kg  URINALYSIS, MICROSCOPIC ONLY     Status: Abnormal   Collection Time    02/09/13  6:31 AM      Result Value Range   Color, Urine AMBER (*) YELLOW    APPearance CLOUDY (*) CLEAR   Specific Gravity, Urine 1.021  1.005 - 1.030   pH 6.0  5.0 - 8.0   Glucose, UA 100 (*) NEGATIVE mg/dL   Hgb urine dipstick LARGE (*) NEGATIVE   Bilirubin Urine NEGATIVE  NEGATIVE   Ketones, ur 15 (*) NEGATIVE mg/dL   Protein, ur >086 (*) NEGATIVE mg/dL   Urobilinogen, UA 1.0  0.0 - 1.0 mg/dL   Nitrite NEGATIVE  NEGATIVE   Leukocytes, UA TRACE (*) NEGATIVE   WBC, UA 0-2  <3 WBC/hpf   RBC / HPF TOO NUMEROUS TO COUNT  <3 RBC/hpf   Bacteria, UA RARE  RARE   Squamous Epithelial / LPF RARE  RARE   Casts HYALINE CASTS (*) NEGATIVE    Imaging results:  Dg Chest 2 View  02/08/2013  *RADIOLOGY REPORT*  Clinical Data: 50  CHEST - 2 VIEW  Comparison: Prior chest x-ray 01/30/2011  Findings: Interval enlargement in the size of the cardiopericardial silhouette.  Query mild left atrial enlargement.  There is new pulmonary vascular congestion.  There is a 1.8 cm nodular opacity projecting over the left base.  A smaller 8 mm nodular opacity projecting over the left base may represent a prominent nipple shadow.  No acute osseous abnormality.  IMPRESSION:  1.  New cardiomegaly with probable left atrial enlargement and increased pulmonary vascular congestion compared to 01/30/2011. Findings suggest early mild CHF.  2.  New right basilar 18 mm nodular opacity concerning for pulmonary nodule.  Recommend further evaluation with dedicated CT scan of the chest.  3.  Nodular opacity in the left base favored to reflect a prominent nipple shadow.   Original Report Authenticated By: Malachy Moan, M.D.     Assessment and Plan: I agree with the formulated Assessment and Plan with the following changes:   1. Fatigue, night sweats, chills, decreased appetite - Though these are vague symptoms, concern for malignancy given the hematuria and smoking history are high.  - Pulmonary nodule found on CXR - CT chest to further evaluation - Repeat UA to evaluate hematuria further, consider Urology  consult - Likely sources of malignancy would be pulmonary and bladder - CXR also revealed new CM and vascular congestion - TTE for further evaluation; he has amyloidosis which could account for this.  - Check TSH  2. Hematuria/Ketonuria/Proteinuria - Recheck UA - Consider Urology consult for cystoscopy  3. H/O HTN - Continue home medications, norvasc - Monitor with vital signs q shift  4. AKI on CKD - Follow  renal function - PO fluid intake - No IV contrast - ? Renal pathology accounting for above, consider CT abdomen if CT lung not helpful in determining cause of symptoms noted in #1 - IVF if needed - ? Progression of amyloidosis - renal consult.   Further issues per resident note.     Inez Catalina, MD 5/4/201410:36 AM

## 2013-02-10 LAB — BASIC METABOLIC PANEL
BUN: 32 mg/dL — ABNORMAL HIGH (ref 6–23)
Creatinine, Ser: 3.82 mg/dL — ABNORMAL HIGH (ref 0.50–1.35)
GFR calc non Af Amer: 15 mL/min — ABNORMAL LOW (ref >90)
Glucose, Bld: 85 mg/dL (ref 70–99)
Potassium: 3.4 mEq/L — ABNORMAL LOW (ref 3.5–5.1)

## 2013-02-10 LAB — BLOOD GAS, ARTERIAL
Bicarbonate: 22 mEq/L (ref 20.0–24.0)
Patient temperature: 98.6
TCO2: 23 mmol/L (ref 0–100)
pCO2 arterial: 33.4 mmHg — ABNORMAL LOW (ref 35.0–45.0)
pH, Arterial: 7.435 (ref 7.350–7.450)
pO2, Arterial: 67.3 mmHg — ABNORMAL LOW (ref 80.0–100.0)

## 2013-02-10 LAB — CBC
HCT: 39 % (ref 39.0–52.0)
Hemoglobin: 13.6 g/dL (ref 13.0–17.0)
MCHC: 34.9 g/dL (ref 30.0–36.0)
MCV: 94.4 fL (ref 78.0–100.0)
RDW: 13.1 % (ref 11.5–15.5)

## 2013-02-10 MED ORDER — IPRATROPIUM BROMIDE 0.02 % IN SOLN: 0.5000 mg | RESPIRATORY_TRACT | Status: DC | PRN

## 2013-02-10 MED ORDER — ALBUTEROL SULFATE (5 MG/ML) 0.5% IN NEBU: 2.5000 mg | INHALATION_SOLUTION | RESPIRATORY_TRACT | Status: DC | PRN

## 2013-02-10 MED ORDER — ALBUTEROL SULFATE (5 MG/ML) 0.5% IN NEBU: 2.5000 mg | INHALATION_SOLUTION | Freq: Three times a day (TID) | RESPIRATORY_TRACT | Status: DC

## 2013-02-10 NOTE — Progress Notes (Signed)
S: Had multiple watery stools yest and a few this AM but says he is feeling better O:BP 142/90  Pulse 71  Temp(Src) 98.3 F (36.8 C) (Oral)  Resp 20  Ht 5\' 11"  (1.803 m)  Wt 72.394 kg (159 lb 9.6 oz)  BMI 22.27 kg/m2  SpO2 97%  Intake/Output Summary (Last 24 hours) at 02/10/13 0931 Last data filed at 02/10/13 0805  Gross per 24 hour  Intake    940 ml  Output    750 ml  Net    190 ml   Weight change: -0.726 kg (-1 lb 9.6 oz) ZOX:WRUEA and alert CVS:RRR Resp:Few crackles Rt base Abd:+ BS NTND Ext:No edema NEURO:CNI Ox3 no asterixis   . amLODipine  5 mg Oral Daily  . heparin  5,000 Units Subcutaneous Q8H  . metoprolol succinate  25 mg Oral Daily  . sodium chloride  3 mL Intravenous Q12H  . tamsulosin  0.4 mg Oral BID   Ct Abdomen Pelvis Wo Contrast  02/09/2013  *RADIOLOGY REPORT*  Clinical Data: Gross hematuria and dysuria.  History of chronic kidney disease and renal cancer.  Concern of possible bladder cancer.  CT ABDOMEN AND PELVIS WITHOUT CONTRAST  Technique:  Multidetector CT imaging of the abdomen and pelvis was performed following the standard protocol without intravenous contrast.  Comparison: Chest CT today.  Abdominal ultrasound 06/30/2010.  Findings: As demonstrated on earlier chest CT, there is central airway thickening in both lung bases with patchy left greater than right air space opacities.  These could reflect aspiration.  As evaluated in the noncontrast state, the liver demonstrates no suspicious findings.  There is probable focal fat near the falciform ligament and a possible 5 mm cyst on image 17.  Likewise, the spleen, gallbladder, pancreas and adrenal glands appear unremarkable as imaged in the noncontrast state.  Both kidneys are mildly atrophied without apparent focal abnormality.  There is no evidence of renal or ureteral calculus. There is no hydronephrosis or asymmetric perinephric soft tissue stranding.  The prostate gland demonstrates moderate to marked  enlargement, measuring approximately 5.5 x 5.4 cm transverse. There is probable protrusion of the median lobe into the bladder base, best seen on the reformatted images.  The bladder is incompletely distended.  There is some bladder wall thickening.  The stomach is poorly distended, likely accounted for suggested wall thickening.  The small bowel is decompressed.  There is enteric contrast within the colon which demonstrates no focal abnormality.  The appendix appears normal.  There is a small amount of free pelvic fluid. There are mildly prominent inguinal lymph nodes bilaterally.  No enlarged abdominal pelvic lymph nodes are seen.  Degenerative changes are noted throughout the lumbar spine associated a convex right scoliosis.  No worrisome osseous findings are identified.  IMPRESSION:  1.  No evidence of urinary tract calculus or hydronephrosis. 2.  Moderate to marked enlargement of the prostate gland with probable protrusion of the median lobe into the bladder base and diffuse bladder wall thickening.  No specific signs of bladder cancer identified.  However, noncontrast CT offers limited evaluation of the bladder.  Direct visualization is necessary to exclude a urothelial lesion. 3.  Both kidneys appear mildly atrophied without focal abnormality on noncontrast imaging. 4.  Mildly prominent inguinal lymph nodes bilaterally. 5.  Lumbar spondylosis.   Original Report Authenticated By: Carey Bullocks, M.D.    Dg Chest 2 View  02/08/2013  *RADIOLOGY REPORT*  Clinical Data: 50  CHEST - 2 VIEW  Comparison:  Prior chest x-ray 01/30/2011  Findings: Interval enlargement in the size of the cardiopericardial silhouette.  Query mild left atrial enlargement.  There is new pulmonary vascular congestion.  There is a 1.8 cm nodular opacity projecting over the left base.  A smaller 8 mm nodular opacity projecting over the left base may represent a prominent nipple shadow.  No acute osseous abnormality.  IMPRESSION:  1.  New  cardiomegaly with probable left atrial enlargement and increased pulmonary vascular congestion compared to 01/30/2011. Findings suggest early mild CHF.  2.  New right basilar 18 mm nodular opacity concerning for pulmonary nodule.  Recommend further evaluation with dedicated CT scan of the chest.  3.  Nodular opacity in the left base favored to reflect a prominent nipple shadow.   Original Report Authenticated By: Malachy Moan, M.D.    Ct Chest Wo Contrast  02/09/2013  *RADIOLOGY REPORT*  Clinical Data: Possible right basilar pulmonary nodule.  History of hypertension, chronic kidney disease and renal cell carcinoma.  CT CHEST WITHOUT CONTRAST  Technique:  Multidetector CT imaging of the chest was performed following the standard protocol without IV contrast.  Comparison: Chest radiographs 02/08/2013 and 01/30/2011.  Findings: Mildly prominent axillary lymph nodes bilaterally have retained fatty hila and are likely benign.  There are 14 and 11 mm AP window nodes on image 24.  There is a mildly prominent precarinal node. No hilar adenopathy is apparent.  There is no significant atherosclerosis or pericardial disease. Trace pleural fluid is present bilaterally.  The lungs demonstrate mild emphysema, central airway thickening and patchy dependent opacity in both lower lobes, greater on the left. No pulmonary nodules are identified.  The visualized upper abdomen is unremarkable.  IMPRESSION:  1.  No evidence of pulmonary nodule. 2.  Chronic lung disease with central airway thickening and patchy dependent opacities in both lung bases.  Early aspiration cannot be excluded.  Radiographic followup is recommended. 3.  No findings highly suspicious for metastatic disease.  There are mildly prominent mediastinal lymph nodes which are nonspecific. If warranted clinically, their stability can be addressed with follow-up CT in approximately 6 months.   Original Report Authenticated By: Carey Bullocks, M.D.    BMET     Component Value Date/Time   NA 138 02/10/2013 0558   K 3.4* 02/10/2013 0558   CL 105 02/10/2013 0558   CO2 23 02/10/2013 0558   GLUCOSE 85 02/10/2013 0558   BUN 32* 02/10/2013 0558   CREATININE 3.82* 02/10/2013 0558   CALCIUM 8.3* 02/10/2013 0558   GFRNONAA 15* 02/10/2013 0558   GFRAA 17* 02/10/2013 0558   CBC    Component Value Date/Time   WBC 4.3 02/10/2013 0558   WBC 4.7 12/14/2010 0943   RBC 4.13* 02/10/2013 0558   RBC 4.13* 12/14/2010 0943   HGB 13.6 02/10/2013 0558   HGB 14.0 12/14/2010 0943   HCT 39.0 02/10/2013 0558   HCT 41.0 12/14/2010 0943   PLT 132* 02/10/2013 0558   PLT 220 12/14/2010 0943   MCV 94.4 02/10/2013 0558   MCV 99.4* 12/14/2010 0943   MCH 32.9 02/10/2013 0558   MCH 34.0* 12/14/2010 0943   MCHC 34.9 02/10/2013 0558   MCHC 34.2 12/14/2010 0943   RDW 13.1 02/10/2013 0558   RDW 13.9 12/14/2010 0943   LYMPHSABS 0.8 02/08/2013 2029   LYMPHSABS 1.0 12/14/2010 0943   MONOABS 1.3* 02/08/2013 2029   MONOABS 0.5 12/14/2010 0943   EOSABS 0.0 02/08/2013 2029   EOSABS 0.4 12/14/2010 0943   BASOSABS  0.0 02/08/2013 2029   BASOSABS 0.0 12/14/2010 0943     Assessment: 1. CKD sec AL Amyloidosis.  Baseline Scr afround 3 and now 3.8.  ? Progression vs acute on CKD 2. Diarrhea, with rigors yest ? Noravirus.  UC and BC pending 3. AL Amyloidosis 4.  Gross hematuria most likely sec to trauma from I/O cath, now urine normal color  Plan: 1. Recheck Scr in AM, if stable to improved then he could go from renal standpoint (provided BC are negative) 2. Push PO fluids 3. Check PO4 level    Hero Mccathern T

## 2013-02-10 NOTE — Progress Notes (Signed)
Internal Medicine Teaching Service Attending Note Date: 02/10/2013  Patient name: Shane [name]  Medical record number: [id number]  Date of birth: 04-06-1941    This patient has been seen and discussed with the house staff. Please see their note for complete details. I concur with their findings.  MULLEN, EMILY 02/10/2013, 10:17 PM

## 2013-02-10 NOTE — Progress Notes (Signed)
Subjective: Pt states that he is feeling much better today than yesterday, when he states that he was having chills and diarrhea. His last episode of diarrhea was at 5am today. Respiratory is present to give the patient a breathing treatment.  Objective: Vital signs in last 24 hours: Filed Vitals:   02/09/13 1407 02/09/13 2023 02/10/13 0613 02/10/13 0834  BP: 142/78 129/58 142/90   Pulse: 66 71 71   Temp: 98.2 F (36.8 C) 99.1 F (37.3 C) 98.3 F (36.8 C)   TempSrc: Oral Oral Oral   Resp: 20 20 20    Height:      Weight:   159 lb 9.6 oz (72.394 kg)   SpO2: 100% 100% 97% 97%   Weight change: -1 lb 9.6 oz (-0.726 kg)  Intake/Output Summary (Last 24 hours) at 02/10/13 0840 Last data filed at 02/10/13 0805  Gross per 24 hour  Intake   1180 ml  Output    750 ml  Net    430 ml   Vitals reviewed. General: resting in bed, NAD HEENT: PERRL, EOMI, no scleral icterus Cardiac: RRR, no rubs, murmurs or gallops Pulm: Diffuse wheezes and crackles on exam Abd: soft, nontender, nondistended, hyperactive bowel sounds present Ext: warm and well perfused, no pedal edema Neuro: alert and oriented X3, nonfocal  Lab Results: Basic Metabolic Panel:  Recent Labs Lab 02/09/13 0550 02/10/13 0558  NA 139 138  K 3.6 3.4*  CL 105 105  CO2 24 23  GLUCOSE 86 85  BUN 31* 32*  CREATININE 3.88* 3.82*  CALCIUM 8.1* 8.3*   Liver Function Tests:  Recent Labs Lab 02/09/13 0550  AST 15  ALT <5  ALKPHOS 49  BILITOT 0.6  PROT 5.5*  ALBUMIN 2.7*    Recent Labs Lab 02/09/13 1510  AMMONIA <10*   CBC:  Recent Labs Lab 02/08/13 2029 02/09/13 0550 02/10/13 0558  WBC 7.1 5.8 4.3  NEUTROABS 4.9  --   --   HGB 15.2 13.5 13.6  HCT 43.4 39.2 39.0  MCV 93.3 94.0 94.4  PLT 158 136* 132*   Cardiac Enzymes:  Recent Labs Lab 02/08/13 2029  TROPONINI <0.30   BNP:  Recent Labs Lab 02/08/13 2029  PROBNP 1411.0*   CBG:  Recent Labs Lab 02/08/13 2130  GLUCAP 89   Thyroid  Function Tests:  Recent Labs Lab 02/09/13 0550  TSH 0.517   Anemia Panel:  Recent Labs Lab 02/09/13 0550  VITAMINB12 226   Urinalysis:  Recent Labs Lab 02/08/13 2120 02/09/13 0631  COLORURINE YELLOW AMBER*  LABSPEC 1.015 1.021  PHURINE 6.5 6.0  GLUCOSEU NEGATIVE 100*  HGBUR MODERATE* LARGE*  BILIRUBINUR NEGATIVE NEGATIVE  KETONESUR NEGATIVE 15*  PROTEINUR >300* >300*  UROBILINOGEN 1.0 1.0  NITRITE NEGATIVE NEGATIVE  LEUKOCYTESUR NEGATIVE TRACE*    Micro Results: No results found for this or any previous visit (from the past 240 hour(s)). 02/09/13: Blood and urine cultures pending  Studies/Results: Ct Abdomen Pelvis Wo Contrast  02/09/2013  *RADIOLOGY REPORT*  Clinical Data: Gross hematuria and dysuria.  History of chronic kidney disease and renal cancer.  Concern of possible bladder cancer.  CT ABDOMEN AND PELVIS WITHOUT CONTRAST  Technique:  Multidetector CT imaging of the abdomen and pelvis was performed following the standard protocol without intravenous contrast.  Comparison: Chest CT today.  Abdominal ultrasound 06/30/2010.  Findings: As demonstrated on earlier chest CT, there is central airway thickening in both lung bases with patchy left greater than right air space opacities.  These  could reflect aspiration.  As evaluated in the noncontrast state, the liver demonstrates no suspicious findings.  There is probable focal fat near the falciform ligament and a possible 5 mm cyst on image 17.  Likewise, the spleen, gallbladder, pancreas and adrenal glands appear unremarkable as imaged in the noncontrast state.  Both kidneys are mildly atrophied without apparent focal abnormality.  There is no evidence of renal or ureteral calculus. There is no hydronephrosis or asymmetric perinephric soft tissue stranding.  The prostate gland demonstrates moderate to marked enlargement, measuring approximately 5.5 x 5.4 cm transverse. There is probable protrusion of the median lobe into the  bladder base, best seen on the reformatted images.  The bladder is incompletely distended.  There is some bladder wall thickening.  The stomach is poorly distended, likely accounted for suggested wall thickening.  The small bowel is decompressed.  There is enteric contrast within the colon which demonstrates no focal abnormality.  The appendix appears normal.  There is a small amount of free pelvic fluid. There are mildly prominent inguinal lymph nodes bilaterally.  No enlarged abdominal pelvic lymph nodes are seen.  Degenerative changes are noted throughout the lumbar spine associated a convex right scoliosis.  No worrisome osseous findings are identified.  IMPRESSION:  1.  No evidence of urinary tract calculus or hydronephrosis. 2.  Moderate to marked enlargement of the prostate gland with probable protrusion of the median lobe into the bladder base and diffuse bladder wall thickening.  No specific signs of bladder cancer identified.  However, noncontrast CT offers limited evaluation of the bladder.  Direct visualization is necessary to exclude a urothelial lesion. 3.  Both kidneys appear mildly atrophied without focal abnormality on noncontrast imaging. 4.  Mildly prominent inguinal lymph nodes bilaterally. 5.  Lumbar spondylosis.   Original Report Authenticated By: Carey Bullocks, M.D.    Dg Chest 2 View  02/08/2013  *RADIOLOGY REPORT*  Clinical Data: 73  CHEST - 2 VIEW  Comparison: Prior chest x-ray 01/30/2011  Findings: Interval enlargement in the size of the cardiopericardial silhouette.  Query mild left atrial enlargement.  There is new pulmonary vascular congestion.  There is a 1.8 cm nodular opacity projecting over the left base.  A smaller 8 mm nodular opacity projecting over the left base may represent a prominent nipple shadow.  No acute osseous abnormality.  IMPRESSION:  1.  New cardiomegaly with probable left atrial enlargement and increased pulmonary vascular congestion compared to 01/30/2011.  Findings suggest early mild CHF.  2.  New right basilar 18 mm nodular opacity concerning for pulmonary nodule.  Recommend further evaluation with dedicated CT scan of the chest.  3.  Nodular opacity in the left base favored to reflect a prominent nipple shadow.   Original Report Authenticated By: Malachy Moan, M.D.    Ct Chest Wo Contrast  02/09/2013  *RADIOLOGY REPORT*  Clinical Data: Possible right basilar pulmonary nodule.  History of hypertension, chronic kidney disease and renal cell carcinoma.  CT CHEST WITHOUT CONTRAST  Technique:  Multidetector CT imaging of the chest was performed following the standard protocol without IV contrast.  Comparison: Chest radiographs 02/08/2013 and 01/30/2011.  Findings: Mildly prominent axillary lymph nodes bilaterally have retained fatty hila and are likely benign.  There are 14 and 11 mm AP window nodes on image 24.  There is a mildly prominent precarinal node. No hilar adenopathy is apparent.  There is no significant atherosclerosis or pericardial disease. Trace pleural fluid is present bilaterally.  The lungs demonstrate  mild emphysema, central airway thickening and patchy dependent opacity in both lower lobes, greater on the left. No pulmonary nodules are identified.  The visualized upper abdomen is unremarkable.  IMPRESSION:  1.  No evidence of pulmonary nodule. 2.  Chronic lung disease with central airway thickening and patchy dependent opacities in both lung bases.  Early aspiration cannot be excluded.  Radiographic followup is recommended. 3.  No findings highly suspicious for metastatic disease.  There are mildly prominent mediastinal lymph nodes which are nonspecific. If warranted clinically, their stability can be addressed with follow-up CT in approximately 6 months.   Original Report Authenticated By: Carey Bullocks, M.D.    Medications: I have reviewed the patient's current medications. Scheduled Meds: . ipratropium  0.5 mg Nebulization TID   And  .  albuterol  2.5 mg Nebulization TID  . amLODipine  5 mg Oral Daily  . heparin  5,000 Units Subcutaneous Q8H  . metoprolol succinate  25 mg Oral Daily  . sodium chloride  3 mL Intravenous Q12H  . tamsulosin  0.4 mg Oral BID   Continuous Infusions: . sodium chloride     PRN Meds:.  Assessment/Plan: [name] is a 72 year old gentleman with a history of chronic kidney disease, amyloidosis, hypertension, tobacco abuse, who presents with fatigue and shortness of breath for 4-5 days prior to admission.  Acute on chronic renal failure:  Patient has a history of AL amyloidosis and associated renal disease. His baseline creatinine is not known. 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 Creatinine on admission is 4.05, BUN is 31. Potassium 4.0. Hemoglobin 15.2. Dr. Arrie Aran follows the patient at [facility], and is also following him in the hospital. He was initially concerned for sepsis from urinary source due to rigors seen yesterday and ordered blood cx x2 and urine cx which are NTD. Today the pt is feeling better, and his Cr has stablized at 3.82 from 3.88 on 5/4. -follow renal function   Cardiomegaly:  New onset, seen on CXR. Possible left atrial enlargement as well with increased pulmonary vascular congestion. Give his h/o of AL amyloidosis, his sx are concerning for amyloid deposition in the heart. A 2D ECHO is pending. - F/u ECHO  Pulmonary nodule:  Patient was noted to have an 18 mm right basilar pulmonary nodule on chest x-ray. Patient is a long-time smoker and is at risk for lung cancer or possibly  Metastasis form other malignancy. CT chest was performed which showed chronic lung disease, no pulmonary nodule, but did show mildly prominent mediastinal lymph nodes, but no evidence for metastatic disease.  Hypertension:  Patient has a history of hypertension, he is on home amlodipine and toprol. His blood  pressure on admission is 152/92, and are improved today. -Continue home Norvasc and toprol   Ketonuria and hematuria:  Patient has ketones and RBCs on UA. Ketones in his urine are not new. 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 This will need to be followed as an outpatient.  BPH vs bladder/prostate malgnancy: H/o BPH. AKI on admission, concerning for urinary retention. Foley placed on admission and home flomax continued. Foley removed on HD#2 2.2 pain and hematuria. No hematuria or difficulty urinating since, but on CT abd/pelivs prostate is enlarged. Possible this is BPH, but concern for possible bladder ca. given smoking hx, hematuria. Spoke to Dr. McDermot with Urology and pt is to f/u as an  outpatient for cystoscopy. - Flomax  DVT PPX:  -heparin   Dispo: Disposition is deferred at this time, awaiting improvement of current medical problems.  Anticipated discharge in approximately 0-2 day(s).   The patient does have a current PCP (HAMRICK,MAURA L, MD), therefore will not be requiring [facility]PC follow-up after discharge.   The patient does not have transportation limitations that hinder transportation to clinic appointments.  .Services Needed at time of discharge: Y = Yes, Blank = No PT:   OT:   RN:   Equipment:   Other:     LOS: 2 days   Genelle Gather 02/10/2013, 8:40 AM

## 2013-02-10 NOTE — Progress Notes (Signed)
PT Cancellation Note  Patient Details Name: MONICA ZAHLER MRN: [id number] DOB: 1941-05-03   Cancelled Treatment:     Pt deferring PT session at this time.  States he has been up & moving around multiple times today.   Pt states he will work with therapy tomorrow but not this afternoon.  Will f/u tomorrow.      Verdell Face, [place] [phone] 02/10/2013

## 2013-02-11 DIAGNOSIS — N189 Chronic kidney disease, unspecified: Secondary | ICD-10-CM

## 2013-02-11 DIAGNOSIS — N179 Acute kidney failure, unspecified: Secondary | ICD-10-CM

## 2013-02-11 LAB — BASIC METABOLIC PANEL
BUN: 30 mg/dL — ABNORMAL HIGH (ref 6–23)
Chloride: 108 mEq/L (ref 96–112)
Creatinine, Ser: 3.9 mg/dL — ABNORMAL HIGH (ref 0.50–1.35)
GFR calc non Af Amer: 14 mL/min — ABNORMAL LOW (ref >90)
Glucose, Bld: 78 mg/dL (ref 70–99)
Potassium: 3.7 mEq/L (ref 3.5–5.1)

## 2013-02-11 LAB — PHOSPHORUS: Phosphorus: 2.6 mg/dL (ref 2.3–4.6)

## 2013-02-11 MED ORDER — AMLODIPINE BESYLATE 10 MG PO TABS: 10.0000 mg | ORAL_TABLET | Freq: Every day | ORAL | Status: DC

## 2013-02-11 MED ORDER — IPRATROPIUM-ALBUTEROL 18-103 MCG/ACT IN AERO: 2.0000 | INHALATION_SPRAY | Freq: Four times a day (QID) | RESPIRATORY_TRACT | Status: DC | PRN

## 2013-02-11 NOTE — Progress Notes (Addendum)
Subjective: Pt states that he is feeling good today. He denies any further chills or diarrhea. He states that he is ready to go home.   Objective: Vital signs in last 24 hours: Filed Vitals:   02/10/13 1008 02/10/13 1415 02/10/13 2121 02/11/13 0448  BP:  142/74 140/73 148/82  Pulse: 84 72 75 63  Temp:  98 F (36.7 C) 98.1 F (36.7 C) 98.1 F (36.7 C)  TempSrc:  Oral Oral Oral  Resp:  20 20 20   Height:      Weight:    158 lb 1.6 oz (71.714 kg)  SpO2:  97% 97% 99%   Weight change: -1 lb 8 oz (-0.68 kg)  Intake/Output Summary (Last 24 hours) at 02/11/13 0954 Last data filed at 02/11/13 0950  Gross per 24 hour  Intake    700 ml  Output   1075 ml  Net   -375 ml   Vitals reviewed. General: resting in bed, NAD HEENT: PERRL, EOMI, no scleral icterus Cardiac: RRR, no rubs, murmurs or gallops Pulm: Diffuse wheezes and crackles on exam Abd: soft, nontender, nondistended, hyperactive bowel sounds present Ext: warm and well perfused, no pedal edema Neuro: alert and oriented X3, nonfocal  Lab Results: Basic Metabolic Panel:  Recent Labs Lab 02/10/13 0558 02/11/13 0445  NA 138 143  K 3.4* 3.7  CL 105 108  CO2 23 24  GLUCOSE 85 78  BUN 32* 30*  CREATININE 3.82* 3.90*  CALCIUM 8.3* 8.6  PHOS 3.1 2.6   Liver Function Tests:  Recent Labs Lab 02/09/13 0550  AST 15  ALT <5  ALKPHOS 49  BILITOT 0.6  PROT 5.5*  ALBUMIN 2.7*    Recent Labs Lab 02/09/13 1510  AMMONIA <10*   CBC:  Recent Labs Lab 02/08/13 2029 02/09/13 0550 02/10/13 0558  WBC 7.1 5.8 4.3  NEUTROABS 4.9  --   --   HGB 15.2 13.5 13.6  HCT 43.4 39.2 39.0  MCV 93.3 94.0 94.4  PLT 158 136* 132*   Cardiac Enzymes:  Recent Labs Lab 02/08/13 2029  TROPONINI <0.30   BNP:  Recent Labs Lab 02/08/13 2029  PROBNP 1411.0*   CBG:  Recent Labs Lab 02/08/13 2130  GLUCAP 89   Thyroid Function Tests:  Recent Labs Lab 02/09/13 0550  TSH 0.517   Anemia Panel:  Recent Labs Lab  02/09/13 0550  VITAMINB12 226   Urinalysis:  Recent Labs Lab 02/08/13 2120 02/09/13 0631  COLORURINE YELLOW AMBER*  LABSPEC 1.015 1.021  PHURINE 6.5 6.0  GLUCOSEU NEGATIVE 100*  HGBUR MODERATE* LARGE*  BILIRUBINUR NEGATIVE NEGATIVE  KETONESUR NEGATIVE 15*  PROTEINUR >300* >300*  UROBILINOGEN 1.0 1.0  NITRITE NEGATIVE NEGATIVE  LEUKOCYTESUR NEGATIVE TRACE*    Micro Results: Recent Results (from the past 240 hour(s))  CULTURE, BLOOD (ROUTINE X 2)     Status: None   Collection Time    02/09/13  3:00 PM      Result Value Range Status   Specimen Description BLOOD RIGHT ARM   Final   Special Requests BOTTLES DRAWN AEROBIC AND ANAEROBIC 10CC   Final   Culture  Setup Time 02/09/2013 20:56   Final   Culture     Final   Value:        BLOOD CULTURE RECEIVED NO GROWTH TO DATE CULTURE WILL BE HELD FOR 5 DAYS BEFORE ISSUING A FINAL NEGATIVE REPORT   Report Status PENDING   Incomplete  CULTURE, BLOOD (ROUTINE X 2)     Status:  None   Collection Time    02/09/13  3:15 PM      Result Value Range Status   Specimen Description BLOOD RIGHT ARM   Final   Special Requests BOTTLES DRAWN AEROBIC AND ANAEROBIC 10CC   Final   Culture  Setup Time 02/09/2013 20:56   Final   Culture     Final   Value:        BLOOD CULTURE RECEIVED NO GROWTH TO DATE CULTURE WILL BE HELD FOR 5 DAYS BEFORE ISSUING A FINAL NEGATIVE REPORT   Report Status PENDING   Incomplete  URINE CULTURE     Status: None   Collection Time    02/09/13  3:20 PM      Result Value Range Status   Specimen Description URINE, CLEAN CATCH   Final   Special Requests NONE   Final   Culture  Setup Time 02/09/2013 21:02   Final   Colony Count PENDING   Incomplete   Culture Culture reincubated for better growth   Final   Report Status PENDING   Incomplete   02/09/13: Blood and urine cultures pending  Studies/Results: Ct Abdomen Pelvis Wo Contrast  02/09/2013  *RADIOLOGY REPORT*  Clinical Data: Gross hematuria and dysuria.  History of  chronic kidney disease and renal cancer.  Concern of possible bladder cancer.  CT ABDOMEN AND PELVIS WITHOUT CONTRAST  Technique:  Multidetector CT imaging of the abdomen and pelvis was performed following the standard protocol without intravenous contrast.  Comparison: Chest CT today.  Abdominal ultrasound 06/30/2010.  Findings: As demonstrated on earlier chest CT, there is central airway thickening in both lung bases with patchy left greater than right air space opacities.  These could reflect aspiration.  As evaluated in the noncontrast state, the liver demonstrates no suspicious findings.  There is probable focal fat near the falciform ligament and a possible 5 mm cyst on image 17.  Likewise, the spleen, gallbladder, pancreas and adrenal glands appear unremarkable as imaged in the noncontrast state.  Both kidneys are mildly atrophied without apparent focal abnormality.  There is no evidence of renal or ureteral calculus. There is no hydronephrosis or asymmetric perinephric soft tissue stranding.  The prostate gland demonstrates moderate to marked enlargement, measuring approximately 5.5 x 5.4 cm transverse. There is probable protrusion of the median lobe into the bladder base, best seen on the reformatted images.  The bladder is incompletely distended.  There is some bladder wall thickening.  The stomach is poorly distended, likely accounted for suggested wall thickening.  The small bowel is decompressed.  There is enteric contrast within the colon which demonstrates no focal abnormality.  The appendix appears normal.  There is a small amount of free pelvic fluid. There are mildly prominent inguinal lymph nodes bilaterally.  No enlarged abdominal pelvic lymph nodes are seen.  Degenerative changes are noted throughout the lumbar spine associated a convex right scoliosis.  No worrisome osseous findings are identified.  IMPRESSION:  1.  No evidence of urinary tract calculus or hydronephrosis. 2.  Moderate to marked  enlargement of the prostate gland with probable protrusion of the median lobe into the bladder base and diffuse bladder wall thickening.  No specific signs of bladder cancer identified.  However, noncontrast CT offers limited evaluation of the bladder.  Direct visualization is necessary to exclude a urothelial lesion. 3.  Both kidneys appear mildly atrophied without focal abnormality on noncontrast imaging. 4.  Mildly prominent inguinal lymph nodes bilaterally. 5.  Lumbar spondylosis.  Original Report Authenticated By: Carey Bullocks, M.D.    Medications: I have reviewed the patient's current medications. Scheduled Meds: . [START ON 02/12/2013] amLODipine  10 mg Oral Daily  . heparin  5,000 Units Subcutaneous Q8H  . metoprolol succinate  25 mg Oral Daily  . sodium chloride  3 mL Intravenous Q12H  . tamsulosin  0.4 mg Oral BID   Continuous Infusions: . sodium chloride     PRN Meds:.albuterol, ipratropium  Assessment/Plan: Mr. Schrack is a 72 year old gentleman with a history of chronic kidney disease, amyloidosis, hypertension, tobacco abuse, who presents with fatigue and shortness of breath for 4-5 days prior to admission.  Acute on chronic renal failure:  Patient has a history of AL amyloidosis and associated renal disease. His baseline creatinine is not known. 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 Creatinine on admission is 4.05, BUN is 31. Potassium 4.0. Hemoglobin 15.2. Dr. Arrie Aran follows the patient at [facility], and is also following him in the hospital. He was initially concerned for sepsis from urinary source due to rigors seen 5/4 and ordered blood cx x2 and urine cx which are NTD. Since then, the pt has been feeling better, and his Cr has stablized at 3.9 today from 3.88 on 5/4. - Renal function panel on 5/14 for [facility] - F/u with Dr. Arrie Aran in 2 weeks   Cardiomegaly:  New onset,  seen on CXR. Possible left atrial enlargement as well with increased pulmonary vascular congestion. Give his h/o of AL amyloidosis, his sx are concerning for amyloid deposition in the heart. A 2D ECHO was performed on 5/6 and results are pending. - F/u ECHO  Pulmonary nodule:  Patient was noted to have an 18 mm right basilar pulmonary nodule on chest x-ray. Patient is a long-time smoker and is at risk for lung cancer or possibly  Metastasis form other malignancy. CT chest was performed which showed chronic lung disease, no pulmonary nodule, but did show mildly prominent mediastinal lymph nodes, but no evidence for metastatic disease. This will need to be followed up as an outpatient.  Hypertension:  Patient has a history of hypertension, he is on home amlodipine and toprol. His blood pressure on admission was 152/92, and have been improved since but still mildly elevated. Increasing Norvasc to 10mg  daily. -Continue home toprol  - Norvasc 10mg  po daily  Ketonuria and hematuria:  Patient has ketones and RBCs on UA. Ketones in his urine are not new. 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 This will need to be followed as an outpatient.  BPH vs bladder/prostate malgnancy: H/o BPH. AKI on admission, concerning for urinary retention. Foley placed on admission and home flomax continued. Foley removed on HD#2 2.2 pain and hematuria. No hematuria or difficulty urinating since, but on CT abd/pelivs prostate is enlarged. Possible this is BPH, but concern for possible bladder ca. given smoking hx, hematuria. Spoke to Dr. Sherron Monday with Urology and pt is to f/u as an outpatient for cystoscopy. - Flomax - Outpatient Urology f/u  DVT PPX:  -heparin   Dispo: Disposition is deferred at this time, awaiting improvement of current medical problems.  Anticipated discharge in approximately 0-2 day(s).   The patient does have a current PCP (HAMRICK,MAURA L, MD), therefore will not be requiring OPC follow-up  after discharge.   The patient does not have transportation limitations that hinder transportation to clinic appointments.  Marland Kitchen  Services Needed at time of discharge: Y = Yes, Blank = No PT:   OT:   RN:   Equipment:   Other:     LOS: 3 days   Genelle Gather 02/11/2013, 9:54 AM

## 2013-02-11 NOTE — Progress Notes (Signed)
Physical Therapy Treatment Patient Details Name: KAYLA WEEKES MRN: [id number] DOB: 08/24/1941 Today's Date: 02/11/2013 Time: 4098-1191 PT Time Calculation (min): 12 min  PT Assessment / Plan / Recommendation Comments on Treatment Session  Pt moving well but seems to downplay his need for activity.  He states "im using this time (in hospital) for rest".   Pt agreeable to ambulate but not very open to perform higher level balance activities.  Pt states he ambulates to bathroom multiple times during day but strongly encouraged pt to ambulate with Nsing into hallway BID.  RN made aware.        Follow Up Recommendations  Home health PT;Other (comment)     Does the patient have the potential to tolerate intense rehabilitation     Barriers to Discharge        Equipment Recommendations  None recommended by PT    Recommendations for Other Services    Frequency Min 3X/week   Plan Discharge plan remains appropriate    Precautions / Restrictions Precautions Precautions: Fall Restrictions Weight Bearing Restrictions: No       Mobility  Bed Mobility Bed Mobility: Supine to Sit;Sitting - Scoot to Edge of Bed Supine to Sit: HOB flat;7: Independent Sitting - Scoot to Edge of Bed: 7: Independent Transfers Transfers: Sit to Stand;Stand to Sit Sit to Stand: 5: Supervision;With upper extremity assist;From bed Stand to Sit: 5: Supervision;With upper extremity assist;To bed Ambulation/Gait Ambulation/Gait Assistance: 5: Supervision Ambulation Distance (Feet): 100 Feet Assistive device: None Ambulation/Gait Assistance Details: Pt performed gait speed changes & head turns.  No LOB noted.   Feel pt could have increased distance but he states "that's enough, time to return back to bed".   Gait Pattern: Step-through pattern General Gait Details: Pt not very open to performing various gait challenges.   Stairs: No Wheelchair Mobility Wheelchair Mobility: No      PT Goals Acute Rehab PT  Goals Time For Goal Achievement: 02/16/13 Potential to Achieve Goals: Good Pt will go Sit to Stand: with modified independence PT Goal: Sit to Stand - Progress: Progressing toward goal Pt will go Stand to Sit: with modified independence PT Goal: Stand to Sit - Progress: Progressing toward goal Pt will Ambulate: >150 feet;with least restrictive assistive device;with supervision PT Goal: Ambulate - Progress: Partly met  Visit Information  Last PT Received On: 02/11/13 Assistance Needed: +1    Subjective Data  Subjective: "Im using this time to get my rest" Patient Stated Goal: To get stronger to go home and care for wife   Cognition  Cognition Arousal/Alertness: Awake/alert Behavior During Therapy: Restless Overall Cognitive Status: Within Functional Limits for tasks assessed    Balance     End of Session PT - End of Session Equipment Utilized During Treatment: Gait belt Activity Tolerance: Patient tolerated treatment well Patient left: in bed;with call bell/phone within reach;with nursing in room Nurse Communication: Mobility status     Verdell Face, [place] [phone] 02/11/2013

## 2013-02-11 NOTE — Progress Notes (Signed)
S: Diarrhea resolved.  Eating well.  Feels well and wants to go home O:BP 148/82  Pulse 63  Temp(Src) 98.1 F (36.7 C) (Oral)  Resp 20  Ht 5\' 11"  (1.803 m)  Wt 71.714 kg (158 lb 1.6 oz)  BMI 22.06 kg/m2  SpO2 99%  Intake/Output Summary (Last 24 hours) at 02/11/13 1057 Last data filed at 02/11/13 0950  Gross per 24 hour  Intake    700 ml  Output   1075 ml  Net   -375 ml   Weight change: -0.68 kg (-1 lb 8 oz) ZOX:WRUEA and alert CVS:RRR Resp:Few crackles Rt base Abd:+ BS NTND Ext:No edema NEURO:CNI Ox3 no asterixis   . [START ON 02/12/2013] amLODipine  10 mg Oral Daily  . heparin  5,000 Units Subcutaneous Q8H  . metoprolol succinate  25 mg Oral Daily  . sodium chloride  3 mL Intravenous Q12H  . tamsulosin  0.4 mg Oral BID   Ct Abdomen Pelvis Wo Contrast  02/09/2013  *RADIOLOGY REPORT*  Clinical Data: Gross hematuria and dysuria.  History of chronic kidney disease and renal cancer.  Concern of possible bladder cancer.  CT ABDOMEN AND PELVIS WITHOUT CONTRAST  Technique:  Multidetector CT imaging of the abdomen and pelvis was performed following the standard protocol without intravenous contrast.  Comparison: Chest CT today.  Abdominal ultrasound 06/30/2010.  Findings: As demonstrated on earlier chest CT, there is central airway thickening in both lung bases with patchy left greater than right air space opacities.  These could reflect aspiration.  As evaluated in the noncontrast state, the liver demonstrates no suspicious findings.  There is probable focal fat near the falciform ligament and a possible 5 mm cyst on image 17.  Likewise, the spleen, gallbladder, pancreas and adrenal glands appear unremarkable as imaged in the noncontrast state.  Both kidneys are mildly atrophied without apparent focal abnormality.  There is no evidence of renal or ureteral calculus. There is no hydronephrosis or asymmetric perinephric soft tissue stranding.  The prostate gland demonstrates moderate to marked  enlargement, measuring approximately 5.5 x 5.4 cm transverse. There is probable protrusion of the median lobe into the bladder base, best seen on the reformatted images.  The bladder is incompletely distended.  There is some bladder wall thickening.  The stomach is poorly distended, likely accounted for suggested wall thickening.  The small bowel is decompressed.  There is enteric contrast within the colon which demonstrates no focal abnormality.  The appendix appears normal.  There is a small amount of free pelvic fluid. There are mildly prominent inguinal lymph nodes bilaterally.  No enlarged abdominal pelvic lymph nodes are seen.  Degenerative changes are noted throughout the lumbar spine associated a convex right scoliosis.  No worrisome osseous findings are identified.  IMPRESSION:  1.  No evidence of urinary tract calculus or hydronephrosis. 2.  Moderate to marked enlargement of the prostate gland with probable protrusion of the median lobe into the bladder base and diffuse bladder wall thickening.  No specific signs of bladder cancer identified.  However, noncontrast CT offers limited evaluation of the bladder.  Direct visualization is necessary to exclude a urothelial lesion. 3.  Both kidneys appear mildly atrophied without focal abnormality on noncontrast imaging. 4.  Mildly prominent inguinal lymph nodes bilaterally. 5.  Lumbar spondylosis.   Original Report Authenticated By: Carey Bullocks, M.D.    BMET    Component Value Date/Time   NA 143 02/11/2013 0445   K 3.7 02/11/2013 0445   CL  108 02/11/2013 0445   CO2 24 02/11/2013 0445   GLUCOSE 78 02/11/2013 0445   BUN 30* 02/11/2013 0445   CREATININE 3.90* 02/11/2013 0445   CALCIUM 8.6 02/11/2013 0445   GFRNONAA 14* 02/11/2013 0445   GFRAA 16* 02/11/2013 0445   CBC    Component Value Date/Time   WBC 4.3 02/10/2013 0558   WBC 4.7 12/14/2010 0943   RBC 4.13* 02/10/2013 0558   RBC 4.13* 12/14/2010 0943   HGB 13.6 02/10/2013 0558   HGB 14.0 12/14/2010 0943   HCT 39.0  02/10/2013 0558   HCT 41.0 12/14/2010 0943   PLT 132* 02/10/2013 0558   PLT 220 12/14/2010 0943   MCV 94.4 02/10/2013 0558   MCV 99.4* 12/14/2010 0943   MCH 32.9 02/10/2013 0558   MCH 34.0* 12/14/2010 0943   MCHC 34.9 02/10/2013 0558   MCHC 34.2 12/14/2010 0943   RDW 13.1 02/10/2013 0558   RDW 13.9 12/14/2010 0943   LYMPHSABS 0.8 02/08/2013 2029   LYMPHSABS 1.0 12/14/2010 0943   MONOABS 1.3* 02/08/2013 2029   MONOABS 0.5 12/14/2010 0943   EOSABS 0.0 02/08/2013 2029   EOSABS 0.4 12/14/2010 0943   BASOSABS 0.0 02/08/2013 2029   BASOSABS 0.0 12/14/2010 0943     Assessment: 1. CKD sec AL Amyloidosis.  Baseline Scr afround 3 and now 3.9.  ? Progression vs acute on CKD 2. Diarrhea, with rigors yest ? Noravirus.  UC and BC neg to date 3. AL Amyloidosis 4.  Gross hematuria most likely sec to trauma from I/O cath, now urine normal color  Plan: 1.  Ok to go from my standpoint.  I have arranged FU renal profile for next week and my office will call him about a FU appt with Dr Arrie Aran in 2 wks 2.  He should FU with his hematologist for his amyloid.    Shamari Trostel T

## 2013-02-11 NOTE — Discharge Summary (Signed)
Internal Medicine Teaching [facility] Discharge Note  Name: Shane [name] MRN: [id number] DOB: November 29, 1940 72 y.o.  Date of Admission: 02/08/2013  8:00 PM Date of Discharge: 02/11/2013 Attending Physician: No att. providers found  Discharge Diagnosis: Active Problems:   Generalized weakness   Tobacco abuse   Acute on chronic renal failure   Hypertension   Discharge Medications:   Medication List    TAKE these medications       albuterol-ipratropium 18-103 MCG/ACT inhaler  Commonly known as:  COMBIVENT  Inhale 2 puffs into the lungs every 6 (six) hours as needed for wheezing.     amLODipine 10 MG tablet  Commonly known as:  NORVASC  Take 1 tablet (10 mg total) by mouth daily.  Start taking on:  02/12/2013     FLOMAX 0.4 MG Caps  Generic drug:  tamsulosin  Take 0.4 mg by mouth 2 (two) times daily.     metoprolol succinate 50 MG 24 hr tablet  Commonly known as:  TOPROL-XL  Take 25 mg by mouth daily. Take with or immediately following a meal.        Disposition and follow-up:   Mr.Kailan L Nunn was discharged from [facility] in Stable condition.  At the hospital follow up visit please address  - His hematuria and bladder thickening: Appt scheduled at [facility] for 5/16 @ 1:15pm - His renal disease: Appt with Dr. Arrie Aran, nephrologist, in 2 weeks, checking renal panel prior to clinic visit - The status of his amyloidosis: Appt with Dr. Ascencion Dike, Oncologist, 5/30 @ 3:30pm - His blood pressure, his Norvasc was increased to 10mg  qday  - Please f/u his ECHO results, unfortunately it has not been read yet.  Follow-up Appointments:     Follow-up Information   Follow up with CONROY,NATHAN, PA-C On 02/19/2013. (At 10:30am)    Contact information:   [facility] [address] [place] [address] [phone]       Follow up with [facility] in [place], [place] On 02/19/2013. (For labs)    Contact information:   [address] [place], [place] [phone]    Medical Laboratory      Follow up with COLADONATO,[name], MD In 2 weeks. (You will be contacted about the time)    Contact information:   [address] [facility] [place] [address] [phone]       Follow up with [facility], MD On 03/07/2013. (At 3:30pm)    Contact information:   [address] [place] [address] [phone]       Follow up with MACDIARMID,SCOTT A, MD On 02/21/2013. (At 1:15pm)    Contact information:   [address] [facility] [place] [address] [phone]      Discharge Orders   Future Orders Complete By Expires     Activity as tolerated - No restrictions  As directed     Diet general  As directed     Discharge instructions  As directed     Comments:      Please keep all of your appointments. They are very important!       Consultations: Treatment Team:  Irena Cords, MD  Procedures Performed:  Ct Abdomen Pelvis Wo Contrast  02/09/2013  *RADIOLOGY REPORT*  Clinical Data: Gross hematuria and dysuria.  History of chronic kidney disease and renal cancer.  Concern of possible bladder cancer.  CT ABDOMEN AND PELVIS WITHOUT CONTRAST  Technique:  Multidetector CT imaging of the abdomen and pelvis was performed following the standard protocol without intravenous contrast.  Comparison: Chest CT today.  Abdominal ultrasound 06/30/2010.  Findings: As demonstrated on earlier chest CT, there is central airway thickening in both lung bases with patchy left greater than right air space opacities.  These could reflect aspiration.  As evaluated in the noncontrast state, the liver demonstrates no suspicious findings.  There is probable focal fat near the falciform ligament and a possible 5 mm cyst on image 17.  Likewise, the spleen, gallbladder, pancreas and adrenal glands appear unremarkable as imaged in the noncontrast state.  Both kidneys are mildly  atrophied without apparent focal abnormality.  There is no evidence of renal or ureteral calculus. There is no hydronephrosis or asymmetric perinephric soft tissue stranding.  The prostate gland demonstrates moderate to marked enlargement, measuring approximately 5.5 x 5.4 cm transverse. There is probable protrusion of the median lobe into the bladder base, best seen on the reformatted images.  The bladder is incompletely distended.  There is some bladder wall thickening.  The stomach is poorly distended, likely accounted for suggested wall thickening.  The small bowel is decompressed.  There is enteric contrast within the colon which demonstrates no focal abnormality.  The appendix appears normal.  There is a small amount of free pelvic fluid. There are mildly prominent inguinal lymph nodes bilaterally.  No enlarged abdominal pelvic lymph nodes are seen.  Degenerative changes are noted throughout the lumbar spine associated a convex right scoliosis.  No worrisome osseous findings are identified.  IMPRESSION:  1.  No evidence of urinary tract calculus or hydronephrosis. 2.  Moderate to marked enlargement of the prostate gland with probable protrusion of the median lobe into the bladder base and diffuse bladder wall thickening.  No specific signs of bladder cancer identified.  However, noncontrast CT offers limited evaluation of the bladder.  Direct visualization is necessary to exclude a urothelial lesion. 3.  Both kidneys appear mildly atrophied without focal abnormality on noncontrast imaging. 4.  Mildly prominent inguinal lymph nodes bilaterally. 5.  Lumbar spondylosis.   Original Report Authenticated By: Carey Bullocks, M.D.    Dg Chest 2 View  02/08/2013  *RADIOLOGY REPORT*  Clinical Data: 74  CHEST - 2 VIEW  Comparison: Prior chest x-ray 01/30/2011  Findings: Interval enlargement in the size of the cardiopericardial silhouette.  Query mild left atrial enlargement.  There is new pulmonary vascular congestion.   There is a 1.8 cm nodular opacity projecting over the left base.  A smaller 8 mm nodular opacity projecting over the left base may represent a prominent nipple shadow.  No acute osseous abnormality.  IMPRESSION:  1.  New cardiomegaly with probable left atrial enlargement and increased pulmonary vascular congestion compared to 01/30/2011. Findings suggest early mild CHF.  2.  New right basilar 18 mm nodular opacity concerning for pulmonary nodule.  Recommend further evaluation with dedicated CT scan of the chest.  3.  Nodular opacity in the left base favored to reflect a prominent nipple shadow.   Original Report Authenticated By: Malachy Moan, M.D.    Ct Chest Wo Contrast  02/09/2013  *RADIOLOGY REPORT*  Clinical Data: Possible right basilar pulmonary nodule.  History of hypertension, chronic kidney disease and renal cell carcinoma.  CT CHEST WITHOUT CONTRAST  Technique:  Multidetector CT imaging of the chest was performed following the standard protocol without IV contrast.  Comparison: Chest radiographs 02/08/2013 and 01/30/2011.  Findings: Mildly prominent axillary lymph nodes  bilaterally have retained fatty hila and are likely benign.  There are 14 and 11 mm AP window nodes on image 24.  There is a mildly prominent precarinal node. No hilar adenopathy is apparent.  There is no significant atherosclerosis or pericardial disease. Trace pleural fluid is present bilaterally.  The lungs demonstrate mild emphysema, central airway thickening and patchy dependent opacity in both lower lobes, greater on the left. No pulmonary nodules are identified.  The visualized upper abdomen is unremarkable.  IMPRESSION:  1.  No evidence of pulmonary nodule. 2.  Chronic lung disease with central airway thickening and patchy dependent opacities in both lung bases.  Early aspiration cannot be excluded.  Radiographic followup is recommended. 3.  No findings highly suspicious for metastatic disease.  There are mildly prominent  mediastinal lymph nodes which are nonspecific. If warranted clinically, their stability can be addressed with follow-up CT in approximately 6 months.   Original Report Authenticated By: Carey Bullocks, M.D.     2D Echo:  02/11/13:  Results pending  Admission History of Present Illness:  Mr. Coopersmith is a 72 year old gentleman with a history of chronic kidney disease, amyloidosis, hypertension, tobacco abuse, who presents with fatigue and shortness of breath over the past 4-5 days. Also complains of dysuria, polyuria and polydipsia. He states that he has felt progressively more "sluggish" over the past week or 2. He endorses a cough, white sputum production, fever, chills, dizziness.  Patient denies shortness of breath, nausea, vomiting, chest pain, lower extremity edema, hematuria, hematochezia, melena. He denies any recent falls area and denies numbness or tingling. Denies focal weakness. Denies any weight loss over the past several months.  Bladder scan in ED with over 300 cc, postvoid is 177, foley placed.  Review of Systems:  Pertinent items noted in HPI  Physical Exam  Blood pressure 152/92, pulse 73, temperature 99.7 F (37.6 C), temperature source Oral, resp. rate 28, SpO2 95.00%.  General: No acute distress, alert and oriented x 3, appears older than stated age  HEENT: PERRL, EOMI, very dry mucous membranes, oropharynx clear, no lesions  Cardiovascular: Regular rate and rhythm, no murmurs, rubs or gallops  Respiratory: Expiratory wheezes noted bilaterally, decreased breath sounds throughout, occasional crackles at the bases  Abdomen: Soft, nondistended, nontender, bowel sounds present  Extremities: Warm and well-perfused, no edema. Scattered areas of hypopigmentation, chronic. AV fistula of the left upper extremity noted, no thrill, no bruit  Skin: Warm, dry, no rashes  Neuro: Cranial nerves tested and intact, strength is 5 out of 5 throughout, downgoing Babinski's bilaterally, sensation  to light touch intact throughout, gait testing deferred    Hospital Course by problem list: Mr. Conly is a 72 year old gentleman with a history of chronic kidney disease, amyloidosis, hypertension, tobacco abuse, who presents with fatigue and shortness of breath for 4-5 days prior to admission.   Acute on chronic renal failure:  Patient has a history of AL amyloidosis and associated renal disease. His baseline creatinine is not known. 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 Creatinine on admission is 4.05, BUN is 31. Potassium 4.0. Hemoglobin 15.2. Dr. Arrie Aran follows the patient at [facility], and is also following him in the hospital. He was initially concerned for sepsis from urinary source due to rigors seen 5/4 and ordered blood cx x2 and urine cx which are NTD. Since then, the pt has been feeling better, and  his Cr has stablized at 3.9 today from 3.88 on 5/4.  - Renal function panel on 5/14 for [facility]  - F/u with Dr. Arrie Aran in 2 weeks   Cardiomegaly:  New onset, seen on CXR. Possible left atrial enlargement as well with increased pulmonary vascular congestion. Give his h/o of AL amyloidosis, his sx are concerning for amyloid deposition in the heart. A 2D ECHO was performed on 5/6 and results are pending.  - F/u ECHO   Prominent mediastinal lymph nodes:  Patient was noted to have an 18 mm right basilar pulmonary nodule on chest x-ray. Patient is a long-time smoker and is at risk for lung cancer or possibly Metastasis form other malignancy. CT chest was performed which showed chronic lung disease, no pulmonary nodule, but did show mildly prominent mediastinal lymph nodes, but no evidence for metastatic disease. This will need to be followed up as an outpatient.   Hypertension:  Patient has a history of hypertension, he is on home amlodipine and toprol. His blood pressure on admission was  152/92, and have been improved since but still mildly elevated. Increasing Norvasc to 10mg  daily.  -Continue home toprol  - Norvasc 10mg  po daily   Ketonuria and hematuria:  Patient has ketones and RBCs on UA. Ketones in his urine are not new. 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 This will need to be followed as an outpatient.   BPH vs bladder/prostate malgnancy:  H/o BPH. AKI on admission, concerning for urinary retention. Foley placed on admission and home flomax continued. Foley removed on HD#2 2.2 pain and hematuria. No hematuria or difficulty urinating since, but on CT abd/pelivs prostate is enlarged. Possible this is BPH, but concern for possible bladder ca. given smoking hx, hematuria. Spoke to Dr. Sherron Monday with Urology and pt is to f/u as an outpatient for cystoscopy.  - Flomax  - Outpatient Urology f/u      Discharge Vitals:  BP 148/82  Pulse 63  Temp(Src) 98.1 F (36.7 C) (Oral)  Resp 20  Ht 5\' 11"  (1.803 m)  Wt 158 lb 1.6 oz (71.714 kg)  BMI 22.06 kg/m2  SpO2 99%  Discharge Labs:  Results for orders placed during the hospital encounter of 02/08/13 (from the past 24 hour(s))  BASIC METABOLIC PANEL     Status: Abnormal   Collection Time    02/11/13  4:45 AM      Result Value Range   Sodium 143  135 - 145 mEq/L   Potassium 3.7  3.5 - 5.1 mEq/L   Chloride 108  96 - 112 mEq/L   CO2 24  19 - 32 mEq/L   Glucose, Bld 78  70 - 99 mg/dL   BUN 30 (*) 6 - 23 mg/dL   Creatinine, Ser 3.08 (*) 0.50 - 1.35 mg/dL   Calcium 8.6  8.4 - 65.7 mg/dL   GFR calc non Af Amer 14 (*) >90 mL/min   GFR calc Af Amer 16 (*) >90 mL/min  PHOSPHORUS     Status: None   Collection Time    02/11/13  4:45 AM      Result Value Range   Phosphorus 2.6  2.3 - 4.6 mg/dL    Signed: Genelle Gather 02/11/2013, 2:16 PM   Time Spent on Discharge:  Services Ordered on Discharge: None Equipment Ordered on Discharge: None

## 2013-02-11 NOTE — Progress Notes (Signed)
  Echocardiogram 2D Echocardiogram has been performed.  Shane [name] 02/11/2013, 9:46 AM

## 2013-02-12 LAB — URINE CULTURE

## 2013-02-13 NOTE — Discharge Summary (Signed)
I saw Mr. Paulson on day of discharge and agree with the formulated plan.

## 2013-02-13 NOTE — Progress Notes (Signed)
G-Codes inserted for Physical Therapy Evaluation on 03/10/13   10-Mar-2013 1100  PT G-Codes **NOT FOR INPATIENT CLASS**  Functional Limitation Mobility: Walking and moving around  Mobility: Walking and Moving Around Current Status [phone]) CI  Mobility: Walking and Moving Around Goal Status (U0454) CH  PT General Charges  $$ ACUTE PT VISIT 1 Procedure  PT Evaluation  $Initial PT Evaluation Tier I 1 Procedure  PT Treatments  $Therapeutic Activity 8-22 mins  [place], [place] DPT [phone]

## 2013-02-15 LAB — CULTURE, BLOOD (ROUTINE X 2)
Culture: NO GROWTH
Culture: NO GROWTH

## 2013-04-02 ENCOUNTER — Encounter: Payer: Self-pay | Admitting: Cardiovascular Disease

## 2013-04-02 ENCOUNTER — Ambulatory Visit (INDEPENDENT_AMBULATORY_CARE_PROVIDER_SITE_OTHER): Payer: Medicare Other | Admitting: Cardiovascular Disease

## 2013-04-02 VITALS — BP 158/90 | HR 50 | Ht 71.0 in | Wt 160.2 lb

## 2013-04-02 DIAGNOSIS — N189 Chronic kidney disease, unspecified: Secondary | ICD-10-CM

## 2013-04-02 DIAGNOSIS — E8589 Other amyloidosis: Secondary | ICD-10-CM

## 2013-04-02 DIAGNOSIS — E859 Amyloidosis, unspecified: Secondary | ICD-10-CM | POA: Insufficient documentation

## 2013-04-02 DIAGNOSIS — I10 Essential (primary) hypertension: Secondary | ICD-10-CM

## 2013-04-02 DIAGNOSIS — N179 Acute kidney failure, unspecified: Secondary | ICD-10-CM

## 2013-04-02 NOTE — Assessment & Plan Note (Signed)
Well controlled.  Continue current medications and low sodium Dash type diet.    

## 2013-04-02 NOTE — Assessment & Plan Note (Signed)
F/U Dr Rich Reining  Will need access addressed again in future

## 2013-04-02 NOTE — Patient Instructions (Signed)
Your physician has requested that you have a cardiac MRI. Cardiac MRI uses a computer to create images of your heart as its beating, producing both still and moving pictures of your heart and major blood vessels. For further information please visit [email or web address]. Please follow the instruction sheet given to you today for more information.  Your physician recommends that you continue on your current medications as directed. Please refer to the Current Medication list given to you today.   Your physician recommends that you schedule a follow-up as needed with Dr. Eden Emms

## 2013-04-02 NOTE — Progress Notes (Signed)
Patient ID: BURLE KWAN, male   DOB: 1941/07/25, 72 y.o.   MRN: [id number] Mr. Prabhakar is a 72 year old gentleman with a history of chronic kidney disease, amyloidosis, hypertension, tobacco abuse, who was recently hospitalized for  fatigue and shortness of breath over the past 4-5 days. Also complains of dysuria, polyuria and polydipsia. He states that he has felt progressively more "sluggish" over the past week or 2. He endorses a cough, white sputum production, fever, chills, dizziness. Indicates being Rx for blood cancer ? Plasmacytosis on BM biopsy Has clotted his LUE fistula placed 2  Years ago not yet on dialysis but Cr over 3  Followed by Dr Rich Reining  Unfortunately with renal failure there is no easy way to make diagnosis of cardiac amyloid. He cannot have contrast and MRI cannot make diagnosis of amyloid without gadolinium Risk of NSF too high. His symptoms dont warrant cardiac biopsy as he has no CHF and mitral inflows not restrictive  Echo 02/11/13 suggested ? Cardiac amyloid However sensitivity diminished in CRF patients and mitral inflows not restrictive  Study Conclusions  - Left ventricle: There was mild concentric hypertrophywith moderate septal hypertrophy. The septum is bright and speckled, suggestive of possible cardiac amyloid - additional echo strain imaging or cardiac MRI may be helpful in making the diagnosis. Strain imaging may help better delineate the regional wall motion abnormalities. Systolic function was mildly reduced. The estimated ejection fraction was in the range of 45% to 50%. There is inferoposterior hypokinesis. Doppler parameters are consistent with abnormal left ventricular relaxation (grade 1 diastolic dysfunction). The E/e' ratio is >10, suggesting elevated LV filling pressure. - Mitral valve: Mildly thickened leaflets . Mild regurgitation. - Left atrium: The atrium was normal in size. - Tricuspid valve: Mild regurgitation. - Pulmonary arteries: PA  peak pressure: 34mm Hg (S). - Inferior vena cava: The vessel was normal in size; the respirophasic diameter changes were in the normal range (= 50%); findings are consistent with normal central venous Pressure.  ROS: Denies fever, malais, weight loss, blurry vision, decreased visual acuity, cough, sputum, SOB, hemoptysis, pleuritic pain, palpitaitons, heartburn, abdominal pain, melena, lower extremity edema, claudication, or rash.  All other systems reviewed and negative   General: Affect appropriate Healthy:  appears stated age HEENT: normal Chronically ill black male JVP normal no bruits no thyromegaly Lungs clear with no wheezing and good diaphragmatic motion Heart:  S1/S2 no murmur,rub, gallop or click PMI normal Abdomen: benighn, BS positve, no tenderness, no AAA no bruit.  No HSM or HJR Distal pulses intact with no bruits Clotted graft in LUE No edema Neuro non-focal Skin warm and dry No muscular weakness  Medications Current Outpatient Prescriptions  Medication Sig Dispense Refill  . albuterol-ipratropium (COMBIVENT) 18-103 MCG/ACT inhaler Inhale 2 puffs into the lungs every 6 (six) hours as needed for wheezing.  1 Inhaler  6  . amLODipine (NORVASC) 10 MG tablet Take 1 tablet (10 mg total) by mouth daily.  30 tablet  11  . metoprolol succinate (TOPROL-XL) 50 MG 24 hr tablet Take 25 mg by mouth daily. Take with or immediately following a meal.      . Tamsulosin HCl (FLOMAX) 0.4 MG CAPS Take 0.4 mg by mouth 2 (two) times daily.        No current facility-administered medications for this visit.    Allergies Review of patient's allergies indicates no known allergies.  Family History: Family History  Problem Relation Age of Onset  . Asthma Mother   . Alcohol  abuse Father     Social History: History   Social History  . Marital Status: Married    Spouse Name: N/A    Number of Children: N/A  . Years of Education: N/A   Occupational History  . Not on file.    Social History Main Topics  . Smoking status: Current Every Day Smoker -- 0.50 packs/day for 50 years    Types: Cigarettes  . Smokeless tobacco: Never Used  . Alcohol Use: No  . Drug Use: No  . Sexually Active: Not on file   Other Topics Concern  . Not on file   Social History Narrative  . No narrative on file    Electrocardiogram: 02/08/13 SR rate 67 RBBB voltage is not diminished  Assessment and Plan

## 2013-04-02 NOTE — Assessment & Plan Note (Signed)
Will order MRI without contrast to quantitate EF. However cannot use gadolinium due to risk of NSF No indication for cardiac biopsy.   ECG with good voltage and no restrictive MV inflow filling.  Specificity of echo diagnosis by morphology quite low in setting of CRF

## 2013-04-04 ENCOUNTER — Encounter: Payer: Self-pay | Admitting: Cardiovascular Disease

## 2013-04-25 ENCOUNTER — Ambulatory Visit (HOSPITAL_COMMUNITY)
Admission: RE | Admit: 2013-04-25 | Discharge: 2013-04-25 | Disposition: A | Discharge status: Home / Self Care | Payer: Medicare Other | Source: Ambulatory Visit | Attending: Cardiovascular Disease | Admitting: Cardiovascular Disease

## 2013-04-25 DIAGNOSIS — N19 Unspecified kidney failure: Secondary | ICD-10-CM | POA: Insufficient documentation

## 2013-04-25 DIAGNOSIS — I517 Cardiomegaly: Secondary | ICD-10-CM | POA: Insufficient documentation

## 2013-04-25 DIAGNOSIS — E8589 Other amyloidosis: Secondary | ICD-10-CM

## 2014-11-12 DIAGNOSIS — Z9181 History of falling: Secondary | ICD-10-CM | POA: Diagnosis not present

## 2014-11-12 DIAGNOSIS — N400 Enlarged prostate without lower urinary tract symptoms: Secondary | ICD-10-CM | POA: Diagnosis not present

## 2014-11-12 DIAGNOSIS — Z1389 Encounter for screening for other disorder: Secondary | ICD-10-CM | POA: Diagnosis not present

## 2014-11-12 DIAGNOSIS — E854 Organ-limited amyloidosis: Secondary | ICD-10-CM | POA: Diagnosis not present

## 2014-11-12 DIAGNOSIS — I10 Essential (primary) hypertension: Secondary | ICD-10-CM | POA: Diagnosis not present

## 2014-11-12 DIAGNOSIS — R42 Dizziness and giddiness: Secondary | ICD-10-CM | POA: Diagnosis not present

## 2014-11-12 DIAGNOSIS — Z6822 Body mass index (BMI) 22.0-22.9, adult: Secondary | ICD-10-CM | POA: Diagnosis not present

## 2014-12-16 DIAGNOSIS — N2581 Secondary hyperparathyroidism of renal origin: Secondary | ICD-10-CM | POA: Diagnosis not present

## 2014-12-16 DIAGNOSIS — N400 Enlarged prostate without lower urinary tract symptoms: Secondary | ICD-10-CM | POA: Diagnosis not present

## 2014-12-16 DIAGNOSIS — I129 Hypertensive chronic kidney disease with stage 1 through stage 4 chronic kidney disease, or unspecified chronic kidney disease: Secondary | ICD-10-CM | POA: Diagnosis not present

## 2014-12-16 DIAGNOSIS — E858 Other amyloidosis: Secondary | ICD-10-CM | POA: Diagnosis not present

## 2014-12-16 DIAGNOSIS — N184 Chronic kidney disease, stage 4 (severe): Secondary | ICD-10-CM | POA: Diagnosis not present

## 2014-12-16 DIAGNOSIS — R809 Proteinuria, unspecified: Secondary | ICD-10-CM | POA: Diagnosis not present

## 2015-02-12 DIAGNOSIS — I120 Hypertensive chronic kidney disease with stage 5 chronic kidney disease or end stage renal disease: Secondary | ICD-10-CM | POA: Diagnosis not present

## 2015-02-12 DIAGNOSIS — N400 Enlarged prostate without lower urinary tract symptoms: Secondary | ICD-10-CM | POA: Diagnosis not present

## 2015-02-12 DIAGNOSIS — R809 Proteinuria, unspecified: Secondary | ICD-10-CM | POA: Diagnosis not present

## 2015-02-12 DIAGNOSIS — E858 Other amyloidosis: Secondary | ICD-10-CM | POA: Diagnosis not present

## 2015-02-12 DIAGNOSIS — N2581 Secondary hyperparathyroidism of renal origin: Secondary | ICD-10-CM | POA: Diagnosis not present

## 2015-02-12 DIAGNOSIS — N185 Chronic kidney disease, stage 5: Secondary | ICD-10-CM | POA: Diagnosis not present

## 2015-02-18 ENCOUNTER — Other Ambulatory Visit: Payer: Self-pay | Admitting: Nephrology

## 2015-02-18 DIAGNOSIS — E859 Amyloidosis, unspecified: Secondary | ICD-10-CM

## 2015-02-19 ENCOUNTER — Ambulatory Visit
Admission: RE | Admit: 2015-02-19 | Discharge: 2015-02-19 | Disposition: A | Discharge status: Home / Self Care | Payer: Medicare Other | Source: Ambulatory Visit | Attending: Nephrology | Admitting: Nephrology

## 2015-02-19 DIAGNOSIS — N400 Enlarged prostate without lower urinary tract symptoms: Secondary | ICD-10-CM | POA: Diagnosis not present

## 2015-02-19 DIAGNOSIS — E859 Amyloidosis, unspecified: Secondary | ICD-10-CM | POA: Diagnosis not present

## 2015-02-19 DIAGNOSIS — N329 Bladder disorder, unspecified: Secondary | ICD-10-CM | POA: Diagnosis not present

## 2015-03-03 ENCOUNTER — Other Ambulatory Visit: Payer: Self-pay | Admitting: *Deleted

## 2015-03-03 DIAGNOSIS — N184 Chronic kidney disease, stage 4 (severe): Secondary | ICD-10-CM

## 2015-03-03 DIAGNOSIS — Z01810 Encounter for preprocedural cardiovascular examination: Secondary | ICD-10-CM

## 2015-03-17 DIAGNOSIS — M7061 Trochanteric bursitis, right hip: Secondary | ICD-10-CM | POA: Diagnosis not present

## 2015-04-02 ENCOUNTER — Encounter: Payer: Self-pay | Admitting: Vascular Surgery

## 2015-04-06 ENCOUNTER — Ambulatory Visit (INDEPENDENT_AMBULATORY_CARE_PROVIDER_SITE_OTHER): Payer: Medicare Other | Admitting: Vascular Surgery

## 2015-04-06 ENCOUNTER — Other Ambulatory Visit: Payer: Self-pay

## 2015-04-06 ENCOUNTER — Ambulatory Visit (INDEPENDENT_AMBULATORY_CARE_PROVIDER_SITE_OTHER)
Admission: RE | Admit: 2015-04-06 | Discharge: 2015-04-06 | Disposition: A | Discharge status: Home / Self Care | Payer: Medicare Other | Source: Ambulatory Visit | Attending: Vascular Surgery | Admitting: Vascular Surgery

## 2015-04-06 ENCOUNTER — Ambulatory Visit (HOSPITAL_COMMUNITY)
Admission: RE | Admit: 2015-04-06 | Discharge: 2015-04-06 | Disposition: A | Discharge status: Home / Self Care | Payer: Medicare Other | Source: Ambulatory Visit | Attending: Vascular Surgery | Admitting: Vascular Surgery

## 2015-04-06 ENCOUNTER — Encounter: Payer: Self-pay | Admitting: Vascular Surgery

## 2015-04-06 VITALS — BP 153/84 | HR 51 | Resp 14 | Ht 71.0 in | Wt 155.0 lb

## 2015-04-06 DIAGNOSIS — Z01810 Encounter for preprocedural cardiovascular examination: Secondary | ICD-10-CM

## 2015-04-06 DIAGNOSIS — N184 Chronic kidney disease, stage 4 (severe): Secondary | ICD-10-CM | POA: Insufficient documentation

## 2015-04-06 DIAGNOSIS — I748 Embolism and thrombosis of other arteries: Secondary | ICD-10-CM | POA: Insufficient documentation

## 2015-04-06 NOTE — Progress Notes (Signed)
Filed Vitals:   04/06/15 1012 04/06/15 1017  BP: 143/72 153/84  Pulse: 58 51  Resp: 14   Height: 5\' 11"  (1.803 m)   Weight: 155 lb (70.308 kg)    Body mass index is 21.63 kg/(m^2).

## 2015-04-06 NOTE — Progress Notes (Signed)
Subjective:     Patient ID: Shane [name], male   DOB: 11/14/1940, 74 y.o.   MRN: [id number]  HPI This 74-year-old male with stage IV chronic kidney disease is evaluated for vascular access. Patient was previously seen in 2012 by Dr. Brian Chin. Patient had failed right radial cephalic and left brachiocephalic fistulas at that time. He currently has not been on hemodialysis. He denies pain or numbness in either upper extremity. He is right-handed.   Past Medical History  Diagnosis Date  . 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 Hypertension   . Incarcerated inguinal hernia, unilateral     right side  . Tobacco abuse   . Varicocele   . Cancer     Renal    History  Substance Use Topics  . Smoking status: Current Every Day Smoker -- 0.50 packs/day for 50 years    Types: Cigarettes  . Smokeless tobacco: Never Used  . Alcohol Use: No    Family History  Problem Relation Age of Onset  . Asthma Mother   . Hypertension Mother   . Alcohol abuse Father   . Hypertension Father   . Hypertension Sister   . Diabetes Sister   . Heart disease Sister   . Hypertension Brother   . Peripheral vascular disease Brother   . Hypertension Daughter   . Heart disease Daughter     No Known Allergies   Current outpatient prescriptions:  .  albuterol-ipratropium (COMBIVENT) 18-103 MCG/ACT inhaler, Inhale 2 puffs into the lungs every 6 (six) hours as needed for wheezing., Disp: 1 Inhaler, Rfl: 6 .  amLODipine (NORVASC) 10 MG tablet, Take 1 tablet (10 mg total) by mouth daily., Disp: 30 tablet, Rfl: 11 .  furosemide (LASIX) 80 MG tablet, Take 80 mg by mouth daily. Take half of one daily, Disp: , Rfl:  .  metoprolol succinate (TOPROL-XL) 50 MG 24 hr tablet, Take 25 mg by mouth daily. Take with or immediately following a meal., Disp: , Rfl:  .  Tamsulosin HCl (FLOMAX) 0.4 MG CAPS, Take 0.4 mg by mouth 2 (two) times daily. , Disp: , Rfl:   Filed Vitals:   04/06/15  1012 04/06/15 1017  BP: 143/72 153/84  Pulse: 58 51  Resp: 14   Height: 5' 11" (1.803 m)   Weight: 155 lb (70.308 kg)     Body mass index is 21.63 kg/(m^2).           Review of Systems Complains of dyspnea on exertion , instability of right leg with ambulation because of "bursitis" , productive cough, wheezing, renal failure due to amyloidosis. Other systems negative and complete review of systems     Objective:   Physical Exam BP 153/84 mmHg  Pulse 51  Resp 14  Ht 5' 11" (1.803 m)  Wt 155 lb (70.308 kg)  BMI 21.63 kg/m2  Gen.-alert and oriented x3 in no apparent distress - Thin-ambulates with a cane HEENT normal for age Lungs no rhonchi or wheezing Cardiovascular regular rhythm no murmurs carotid pulses 3+ palpable no bruits audible Abdomen soft nontender no palpable masses Musculoskeletal free of  major deformities Skin clear -no rashes Neurologic normal Lower extremities 3+ femoral and dorsalis pedis pulses palpable bilaterally with no edema  both upper extremities have no palpable radial pulses but do have palpable ulnar pulses. Surface veins are small.   vein mapping was performed today which I reviewed and interpreted. Also an arterial study was   performed. There is triphasic flow in both brachial and ulnar arteries but both radial arteries are occluded.   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 Left basilic vein appears adequate. I performed an independent sono site exam of the left upper extremity and he does appear to have anadequate silicone vein for transposition       Assessment:      chronic kidney disease stage IV needs vascular access     Plan:      plan left basilic vein transposition on Thursday, June 30

## 2015-04-07 ENCOUNTER — Encounter (HOSPITAL_COMMUNITY): Payer: Self-pay | Admitting: *Deleted

## 2015-04-07 MED ORDER — SODIUM CHLORIDE 0.9 % IV SOLN: INTRAVENOUS | Status: DC

## 2015-04-07 MED ORDER — DEXTROSE 5 % IV SOLN
1.5000 g | INTRAVENOUS | Status: AC
  Administered 2015-04-08: 1.5 g via INTRAVENOUS
  Filled 2015-04-07: qty 1.5

## 2015-04-07 NOTE — Progress Notes (Signed)
Pt denies any recent chest pain. He states he's always sob, denies oxygen use. States his PCP, ?Dr. Lon[place]olan in [place] manages his medications. Has not seen a cardiologist in 2 years.

## 2015-04-07 NOTE — Progress Notes (Signed)
Anesthesia Chart Review: SAME DAY WORK-UP.  Patient is a 74 year old male posted for left basilic vein transposition on 04/08/15 by Dr. Hart RochesterLawson.  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 He has a previously failed RUE radiocephalic AVF and left brachiocephalic AVF. He is not yet on hemodialysis. Nephrologist is Dr. Arrie Aranoladonato.   Meds include Combivent, amlodipine, Lasix, Toprol XL, Flomax.  Last EKG in Epic is from 2014.   02/11/13 Echo: Study Conclusions - Left ventricle: There was mild concentric hypertrophywith moderate septal hypertrophy. The septum is bright and speckled, suggestive of possible cardiac amyloid - additional echo strain imaging or cardiac MRI may be helpful in making the diagnosis. Strain imaging may help better delineate the regional wall motion abnormalities. Systolic function was mildly reduced. The estimated ejection fraction was in the range of 45% to 50%. There is inferoposterior hypokinesis. Doppler parameters are consistent with abnormal left ventricular relaxation (grade 1 diastolic dysfunction). The E/e' ratio is >10, suggesting elevated LV filling pressure. - Mitral valve: Mildly thickened leaflets . Mild regurgitation. - Left atrium: The atrium was normal in size. - Tricuspid valve: Mild regurgitation. - Pulmonary arteries: PA peak pressure: 34mm Hg (S). - Inferior vena cava: The vessel was normal in size; the respirophasic diameter changes were in the normal range (= 50%); findings are consistent with normal central venous pressure.  Patient was subsequently referred to cardiologist Dr. 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 Cardiac MRI (without gadolinium due to CKD) report on 05/05/13 showed: Impression: 1) Mild LVH Normal EF 62% no RWMA;s 2) Unable to assess amyloid by abnormal nulling of myocardium post  gadolinium due to CRF. However morphologic features of LV appear benign and uniform on T1 weighted images  3) Mild LAE Dr. Eden EmmsNishan felt there were no high risk features for amyloid in the heart based on these results.   Reviewed above with anesthesiologist Dr. Krista BlueSinger. Plan to evaluate on arrival tomorrow by his anesthesiologist. Maeola HarmanIHe will need EKG and labs on arrival.  If these results are felt acceptable and no acute CV concerns then it is anticipated that he can proceed with hemodialysis access procedure.  Velna Ochsllison Jordana Dugue, PA-C [facility] Phone [phone] 04/07/2015 5:04 PM

## 2015-04-08 ENCOUNTER — Ambulatory Visit (HOSPITAL_COMMUNITY): Payer: Medicare Other | Admitting: Vascular Surgery

## 2015-04-08 ENCOUNTER — Encounter (HOSPITAL_COMMUNITY): Payer: Self-pay | Admitting: Surgery

## 2015-04-08 ENCOUNTER — Telehealth: Payer: Self-pay | Admitting: Vascular Surgery

## 2015-04-08 ENCOUNTER — Ambulatory Visit (HOSPITAL_COMMUNITY)
Admission: RE | Admit: 2015-04-08 | Discharge: 2015-04-08 | Disposition: A | Discharge status: Home / Self Care | Payer: Medicare Other | Source: Ambulatory Visit | Attending: Vascular Surgery | Admitting: Vascular Surgery

## 2015-04-08 ENCOUNTER — Encounter (HOSPITAL_COMMUNITY)
Admission: RE | Disposition: A | Discharge status: Home / Self Care | Payer: Self-pay | Source: Ambulatory Visit | Attending: Vascular Surgery

## 2015-04-08 DIAGNOSIS — I129 Hypertensive chronic kidney disease with stage 1 through stage 4 chronic kidney disease, or unspecified chronic kidney disease: Secondary | ICD-10-CM | POA: Diagnosis not present

## 2015-04-08 DIAGNOSIS — R9431 Abnormal electrocardiogram [ECG] [EKG]: Secondary | ICD-10-CM | POA: Diagnosis not present

## 2015-04-08 DIAGNOSIS — Z79899 Other long term (current) drug therapy: Secondary | ICD-10-CM | POA: Insufficient documentation

## 2015-04-08 DIAGNOSIS — F17210 Nicotine dependence, cigarettes, uncomplicated: Secondary | ICD-10-CM | POA: Diagnosis not present

## 2015-04-08 DIAGNOSIS — M1990 Unspecified osteoarthritis, unspecified site: Secondary | ICD-10-CM | POA: Diagnosis not present

## 2015-04-08 DIAGNOSIS — I4510 Unspecified right bundle-branch block: Secondary | ICD-10-CM | POA: Diagnosis not present

## 2015-04-08 DIAGNOSIS — N184 Chronic kidney disease, stage 4 (severe): Secondary | ICD-10-CM | POA: Insufficient documentation

## 2015-04-08 DIAGNOSIS — Z85528 Personal history of other malignant neoplasm of kidney: Secondary | ICD-10-CM | POA: Diagnosis not present

## 2015-04-08 DIAGNOSIS — R001 Bradycardia, unspecified: Secondary | ICD-10-CM | POA: Diagnosis not present

## 2015-04-08 DIAGNOSIS — F17200 Nicotine dependence, unspecified, uncomplicated: Secondary | ICD-10-CM | POA: Diagnosis not present

## 2015-04-08 DIAGNOSIS — N179 Acute kidney failure, unspecified: Secondary | ICD-10-CM | POA: Insufficient documentation

## 2015-04-08 HISTORY — DX: Unspecified osteoarthritis, unspecified site: M19.90

## 2015-04-08 HISTORY — PX: BASCILIC VEIN TRANSPOSITION: SHX5742

## 2015-04-08 LAB — POCT I-STAT 4, (NA,K, GLUC, HGB,HCT)
GLUCOSE: 82 mg/dL (ref 65–99)
HEMATOCRIT: 39 % (ref 39.0–52.0)
Hemoglobin: 13.3 g/dL (ref 13.0–17.0)
Potassium: 4.3 mmol/L (ref 3.5–5.1)
Sodium: 144 mmol/L (ref 135–145)

## 2015-04-08 SURGERY — TRANSPOSITION, VEIN, BASILIC
Anesthesia: Monitor Anesthesia Care | Laterality: Left

## 2015-04-08 MED ORDER — SODIUM CHLORIDE 0.9 % IV SOLN
INTRAVENOUS | Status: DC | PRN
  Administered 2015-04-08 (×2): via INTRAVENOUS

## 2015-04-08 MED ORDER — DIPHENHYDRAMINE HCL 50 MG/ML IJ SOLN
INTRAMUSCULAR | Status: AC
  Filled 2015-04-08: qty 1

## 2015-04-08 MED ORDER — 0.9 % SODIUM CHLORIDE (POUR BTL) OPTIME
TOPICAL | Status: DC | PRN
  Administered 2015-04-08: 1000 mL

## 2015-04-08 MED ORDER — FENTANYL CITRATE (PF) 100 MCG/2ML IJ SOLN
INTRAMUSCULAR | Status: DC | PRN
  Administered 2015-04-08 (×4): 50 ug via INTRAVENOUS

## 2015-04-08 MED ORDER — LIDOCAINE-EPINEPHRINE (PF) 1 %-1:200000 IJ SOLN
INTRAMUSCULAR | Status: DC | PRN
  Administered 2015-04-08: 30 mL

## 2015-04-08 MED ORDER — CHLORHEXIDINE GLUCONATE CLOTH 2 % EX PADS: 6.0000 | MEDICATED_PAD | Freq: Once | CUTANEOUS | Status: DC

## 2015-04-08 MED ORDER — ROCURONIUM BROMIDE 50 MG/5ML IV SOLN
INTRAVENOUS | Status: AC
  Filled 2015-04-08: qty 1

## 2015-04-08 MED ORDER — FENTANYL CITRATE (PF) 250 MCG/5ML IJ SOLN
INTRAMUSCULAR | Status: AC
  Filled 2015-04-08: qty 5

## 2015-04-08 MED ORDER — PHENYLEPHRINE 40 MCG/ML (10ML) SYRINGE FOR IV PUSH (FOR BLOOD PRESSURE SUPPORT)
PREFILLED_SYRINGE | INTRAVENOUS | Status: AC
  Filled 2015-04-08: qty 10

## 2015-04-08 MED ORDER — LIDOCAINE HCL (CARDIAC) 20 MG/ML IV SOLN
INTRAVENOUS | Status: AC
  Filled 2015-04-08: qty 5

## 2015-04-08 MED ORDER — PROPOFOL 10 MG/ML IV BOLUS
INTRAVENOUS | Status: AC
  Filled 2015-04-08: qty 20

## 2015-04-08 MED ORDER — DIPHENHYDRAMINE HCL 50 MG/ML IJ SOLN: 6.2500 mg | Freq: Once | INTRAMUSCULAR | Status: DC

## 2015-04-08 MED ORDER — OXYCODONE HCL 5 MG PO TABS: 5.0000 mg | ORAL_TABLET | Freq: Four times a day (QID) | ORAL | Status: DC | PRN

## 2015-04-08 MED ORDER — ONDANSETRON HCL 4 MG/2ML IJ SOLN
INTRAMUSCULAR | Status: AC
  Filled 2015-04-08: qty 2

## 2015-04-08 MED ORDER — FENTANYL CITRATE (PF) 100 MCG/2ML IJ SOLN
INTRAMUSCULAR | Status: DC
Start: 2015-04-08 — End: 2015-04-08
  Filled 2015-04-08: qty 2

## 2015-04-08 MED ORDER — EPHEDRINE SULFATE 50 MG/ML IJ SOLN
INTRAMUSCULAR | Status: DC | PRN
  Administered 2015-04-08: 5 mg via INTRAVENOUS
  Administered 2015-04-08 (×2): 10 mg via INTRAVENOUS

## 2015-04-08 MED ORDER — FENTANYL CITRATE (PF) 100 MCG/2ML IJ SOLN
25.0000 ug | INTRAMUSCULAR | Status: DC | PRN
  Administered 2015-04-08: 50 ug via INTRAVENOUS

## 2015-04-08 MED ORDER — SODIUM CHLORIDE 0.9 % IJ SOLN
INTRAMUSCULAR | Status: AC
  Filled 2015-04-08: qty 10

## 2015-04-08 MED ORDER — SODIUM CHLORIDE 0.9 % IR SOLN
Status: DC | PRN
  Administered 2015-04-08: 500 mL

## 2015-04-08 MED ORDER — LIDOCAINE-EPINEPHRINE 1 %-1:100000 IJ SOLN
INTRAMUSCULAR | Status: AC
  Filled 2015-04-08: qty 1

## 2015-04-08 MED ORDER — EPHEDRINE SULFATE 50 MG/ML IJ SOLN
INTRAMUSCULAR | Status: AC
  Filled 2015-04-08: qty 1

## 2015-04-08 MED ORDER — SUCCINYLCHOLINE CHLORIDE 20 MG/ML IJ SOLN
INTRAMUSCULAR | Status: AC
  Filled 2015-04-08: qty 1

## 2015-04-08 MED ORDER — PROPOFOL 10 MG/ML IV BOLUS
INTRAVENOUS | Status: DC | PRN
  Administered 2015-04-08: 170 mg via INTRAVENOUS
  Administered 2015-04-08: 50 mg via INTRAVENOUS
  Administered 2015-04-08: 30 mg via INTRAVENOUS

## 2015-04-08 SURGICAL SUPPLY — 31 items
CANISTER SUCTION 2500CC (MISCELLANEOUS) ×3 IMPLANT
CLIP TI MEDIUM 24 (CLIP) ×3 IMPLANT
CLIP TI WIDE RED SMALL 24 (CLIP) ×3 IMPLANT
COVER PROBE W GEL 5X96 (DRAPES) IMPLANT
DERMABOND ADHESIVE PROPEN (GAUZE/BANDAGES/DRESSINGS) ×2
DERMABOND ADVANCED .7 DNX6 (GAUZE/BANDAGES/DRESSINGS) ×1 IMPLANT
ELECT REM PT RETURN 9FT ADLT (ELECTROSURGICAL) ×3
ELECTRODE REM PT RTRN 9FT ADLT (ELECTROSURGICAL) ×1 IMPLANT
GEL ULTRASOUND 20GR AQUASONIC (MISCELLANEOUS) ×3 IMPLANT
GLOVE SS BIOGEL STRL SZ 7 (GLOVE) ×3 IMPLANT
GLOVE SUPERSENSE BIOGEL SZ 7 (GLOVE) ×6
GOWN STRL REUS W/ TWL LRG LVL3 (GOWN DISPOSABLE) ×3 IMPLANT
GOWN STRL REUS W/TWL LRG LVL3 (GOWN DISPOSABLE) ×6
KIT BASIN OR (CUSTOM PROCEDURE TRAY) ×3 IMPLANT
KIT ROOM TURNOVER OR (KITS) ×3 IMPLANT
LIQUID BAND (GAUZE/BANDAGES/DRESSINGS) ×3 IMPLANT
MARKER SKIN DUAL TIP RULER LAB (MISCELLANEOUS) ×3 IMPLANT
NS IRRIG 1000ML POUR BTL (IV SOLUTION) ×3 IMPLANT
PACK CV ACCESS (CUSTOM PROCEDURE TRAY) ×3 IMPLANT
PAD ARMBOARD 7.5X6 YLW CONV (MISCELLANEOUS) ×6 IMPLANT
SUT PROLENE 6 0 BV (SUTURE) ×3 IMPLANT
SUT SILK 2 0 SH (SUTURE) ×3 IMPLANT
SUT SILK 3 0 (SUTURE) ×2
SUT SILK 3-0 18XBRD TIE 12 (SUTURE) ×1 IMPLANT
SUT SILK 4 0 (SUTURE) ×2
SUT SILK 4-0 18XBRD TIE 12 (SUTURE) ×1 IMPLANT
SUT VIC AB 3-0 SH 27 (SUTURE) ×6
SUT VIC AB 3-0 SH 27X BRD (SUTURE) ×3 IMPLANT
SUT VICRYL 4-0 PS2 18IN ABS (SUTURE) ×3 IMPLANT
UNDERPAD 30X30 INCONTINENT (UNDERPADS AND DIAPERS) ×3 IMPLANT
WATER STERILE IRR 1000ML POUR (IV SOLUTION) ×3 IMPLANT

## 2015-04-08 NOTE — H&P (View-Only) (Signed)
Subjective:     Patient ID: Shane [name], male   DOB: 03-Oct-1941, 74 y.o.   MRN: [id number]  HPI This 74 year old male with stage IV chronic kidney disease is evaluated for vascular access. Patient was previously seen in 2012 by Dr. Gomez Cleverly. Patient had failed right radial cephalic and left brachiocephalic fistulas at that time. He currently has not been on hemodialysis. He denies pain or numbness in either upper extremity. He is right-handed.   Past Medical History  Diagnosis Date  . 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 Hypertension   . Incarcerated inguinal hernia, unilateral     right side  . Tobacco abuse   . Varicocele   . Cancer     Renal    History  Substance Use Topics  . Smoking status: Current Every Day Smoker -- 0.50 packs/day for 50 years    Types: Cigarettes  . Smokeless tobacco: Never Used  . Alcohol Use: No    Family History  Problem Relation Age of Onset  . Asthma Mother   . Hypertension Mother   . Alcohol abuse Father   . Hypertension Father   . Hypertension Sister   . Diabetes Sister   . Heart disease Sister   . Hypertension Brother   . Peripheral vascular disease Brother   . Hypertension Daughter   . Heart disease Daughter     No Known Allergies   Current outpatient prescriptions:  .  albuterol-ipratropium (COMBIVENT) 18-103 MCG/ACT inhaler, Inhale 2 puffs into the lungs every 6 (six) hours as needed for wheezing., Disp: 1 Inhaler, Rfl: 6 .  amLODipine (NORVASC) 10 MG tablet, Take 1 tablet (10 mg total) by mouth daily., Disp: 30 tablet, Rfl: 11 .  furosemide (LASIX) 80 MG tablet, Take 80 mg by mouth daily. Take half of one daily, Disp: , Rfl:  .  metoprolol succinate (TOPROL-XL) 50 MG 24 hr tablet, Take 25 mg by mouth daily. Take with or immediately following a meal., Disp: , Rfl:  .  Tamsulosin HCl (FLOMAX) 0.4 MG CAPS, Take 0.4 mg by mouth 2 (two) times daily. , Disp: , Rfl:   Filed Vitals:   04/06/15  1012 04/06/15 1017  BP: 143/72 153/84  Pulse: 58 51  Resp: 14   Height:  (1.803 m)   Weight: 155 lb (70.308 kg)     Body mass index is 21.63 kg/(m^2).           Review of Systems Complains of dyspnea on exertion , instability of right leg with ambulation because of "bursitis" , productive cough, wheezing, renal failure due to amyloidosis. Other systems negative and complete review of systems     Objective:   Physical Exam BP 153/84 mmHg  Pulse 51  Resp 14  Ht  (1.803 m)  Wt 155 lb (70.308 kg)  BMI 21.63 kg/m2  Gen.-alert and oriented x3 in no apparent distress - Thin-ambulates with a cane HEENT normal for age Lungs no rhonchi or wheezing Cardiovascular regular rhythm no murmurs carotid pulses 3+ palpable no bruits audible Abdomen soft nontender no palpable masses Musculoskeletal free of  major deformities Skin clear -no rashes Neurologic normal Lower extremities 3+ femoral and dorsalis pedis pulses palpable bilaterally with no edema  both upper extremities have no palpable radial pulses but do have palpable ulnar pulses. Surface veins are small.   vein mapping was performed today which I reviewed and interpreted. Also an arterial study was  performed. There is triphasic flow in both brachial and ulnar arteries but both radial arteries are occluded.   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 Left basilic vein appears adequate. I performed an independent sono site exam of the left upper extremity and he does appear to have anadequate silicone vein for transposition       Assessment:      chronic kidney disease stage IV needs vascular access     Plan:      plan left basilic vein transposition on Thursday, June 30

## 2015-04-08 NOTE — Anesthesia Procedure Notes (Signed)
Procedure Name: LMA Insertion Date/Time: 04/08/2015 7:40 AM Performed by: Coralee RudFLORES, Antwian Santaana Pre-anesthesia Checklist: Patient identified Patient Re-evaluated:Patient Re-evaluated prior to inductionOxygen Delivery Method: Circle system utilized Preoxygenation: Pre-oxygenation with 100% oxygen Intubation Type: IV induction LMA: LMA inserted LMA Size: 5.0 Number of attempts: 1 Placement Confirmation: positive ETCO2 and breath sounds checked- equal and bilateral Tube secured with: Tape Dental Injury: Teeth and Oropharynx as per pre-operative assessment

## 2015-04-08 NOTE — Op Note (Signed)
OPERATIVE REPORT  Date of Surgery: 04/08/2015  Surgeon: Shane GipJames Sayana Salley, MD  Assistant: Doreatha MassedSamantha Rhyne PA  Pre-op Diagnosis: Stage IV Chronic Kidney Disease N18.4  Post-op Diagnosis: same  Procedure: Procedure(s): BASILIC VEIN TRANSPOSITION-left upper extremity  Anesthesia: LMA  EBL: Minimal  Complications: None  Procedure Details: The patient was taken operating room placed in supine position at which time satisfactory general-LMA anesthesia was measured. The basilic vein was then imaged using B-mode ultrasound with the SonoSite and was marked accordingly. It did appear to be a very good-sized vein. After prepping and draped in routine sterile manner basilic vein was dissected free through 3 separate incisions along the medial aspect of the left arm up to the axilla. It was an excellent vein being at least 5 mm in size. It was circumferentially mobilized branches were ligated with 3 and 4-0 silk ties and divided. It was ligated distally transected gently dilated with heparinized saline and marked for orientation purposes. Brachial artery was exposed through the distal incision encircled with vessel loop. It was a relatively small vessel but did have an excellent pulse and good Doppler flow at that point and the ulnar artery distally. wsona tunnel was created on the anterior aspect of the arm used the curved tunneler and the basilic vein carefully delivered through the tunnel and delivered into the distal wound where the brachial artery is then exposed. The artery was occluded proximally and distally with vessel loops of the 15 blade extended with Potts scissors. It had excellent inflow and backbleeding. The silicone vein was carefully measured spatulated and anastomosed end-to-side with 60 proline. Vessel loops were released there was an excellent pulse and thrill in the fistula. There was audible ulnar artery flow distally which decreased with the fistula being patent but did not disappear  completely. The radial flow had been absent preoperatively. Adequate hemostasis was achieved and the wounds closed in layers with Vicryls subcuticular fashion with Dermabond patient taken to recovery in satisfactory condition   Shane GipJames Izekiel Flegel, MD 04/08/2015 9:31 AM

## 2015-04-08 NOTE — Interval H&P Note (Signed)
History and Physical Interval Note:  04/08/2015 7:23 AM  Shane MallingEdward L Bogosian  has presented today for surgery, with the diagnosis of Stage IV Chronic Kidney Disease N18.4  The various methods of treatment have been discussed with the patient and family. After consideration of risks, benefits and other options for treatment, the patient has consented to  Procedure(s): BASILIC VEIN TRANSPOSITION (Left) as a surgical intervention .  The patient's history has been reviewed, patient examined, no change in status, stable for surgery.  I have reviewed the patient's chart and labs.  Questions were answered to the patient's satisfaction.     Josephina GipLawson, Icy Fuhrmann

## 2015-04-08 NOTE — Anesthesia Preprocedure Evaluation (Addendum)
Anesthesia Evaluation  Patient identified by MRN, date of birth, ID band Patient awake    Reviewed: Allergy & Precautions, NPO status , Unable to perform ROS - Chart review only  History of Anesthesia Complications Negative for: history of anesthetic complications  Airway Mallampati: I  TM Distance: >3 FB Neck ROM: Full    Dental  (+) Edentulous Upper, Edentulous Lower   Pulmonary shortness of breath, Current Smoker,  1 ppd + rhonchi         Cardiovascular hypertension, Pt. on medications and Pt. on home beta blockers Rhythm:Regular     Neuro/Psych negative neurological ROS  negative psych ROS   GI/Hepatic negative GI ROS, Neg liver ROS,   Endo/Other    Renal/GU Acute on chronic renal failure     Musculoskeletal   Abdominal   Peds  Hematology negative hematology ROS (+)   Anesthesia Other Findings   Reproductive/Obstetrics                           Anesthesia Physical Anesthesia Plan  ASA: III  Anesthesia Plan: General   Post-op Pain Management:    Induction: Intravenous  Airway Management Planned: LMA  Additional Equipment:   Intra-op Plan:   Post-operative Plan:   Informed Consent: I have reviewed the patients History and Physical, chart, labs and discussed the procedure including the risks, benefits and alternatives for the proposed anesthesia with the patient or authorized representative who has indicated his/her understanding and acceptance.   Dental advisory given  Plan Discussed with: CRNA and Anesthesiologist  Anesthesia Plan Comments:       Anesthesia Quick Evaluation

## 2015-04-08 NOTE — Telephone Encounter (Signed)
Patient was still admitted, pts wife said she would have him call when he gets home, dpm

## 2015-04-08 NOTE — Discharge Instructions (Signed)
° ° °  04/08/2015 Shane [name] [id number] 1941/09/30  Surgeon(s): Pryor OchoaJames D Lawson, MD  Procedure(s): BASILIC VEIN TRANSPOSITION-left  x Do not stick fistula for 12 weeks

## 2015-04-08 NOTE — Telephone Encounter (Signed)
-----   Message from Sharee PimpleMarilyn K McChesney, RN sent at 04/08/2015  9:43 AM EDT ----- Regarding: Schedule   ----- Message -----    From: Dara LordsSamantha J Rhyne, PA-C    Sent: 04/08/2015   9:16 AM      To: Vvs Charge Pool  S/p left BVT 04/08/15.  F/u with Dr. Hart RochesterLawson in 6 weeks.  He does not need a duplex.  Thanks!

## 2015-04-08 NOTE — Transfer of Care (Signed)
Immediate Anesthesia Transfer of Care Note  Patient: Shane [name]  Procedure(s) Performed: Procedure(s): BASILIC VEIN TRANSPOSITION (Left)  Patient Location: PACU  Anesthesia Type:General  Level of Consciousness: awake, pateint uncooperative and confused  Airway & Oxygen Therapy: Patient Spontanous Breathing and Patient connected to face mask oxygen  Post-op Assessment: Report given to RN, Post -op Vital signs reviewed and stable and Patient moving all extremities  Post vital signs: Reviewed and stable  Last Vitals:  Filed Vitals:   04/08/15 0600  BP: 165/97  Pulse: 60  Temp: 36.8 C  Resp: 16    Complications: No apparent anesthesia complications

## 2015-04-08 NOTE — Anesthesia Postprocedure Evaluation (Signed)
  Anesthesia Post-op Note  Patient: Shane [name]  Procedure(s) Performed: Procedure(s): BASILIC VEIN TRANSPOSITION (Left)  Patient Location: PACU  Anesthesia Type:General  Level of Consciousness: awake  Airway and Oxygen Therapy: Patient Spontanous Breathing  Post-op Pain: none  Post-op Assessment: Post-op Vital signs reviewed, Patient's Cardiovascular Status Stable, Respiratory Function Stable, Patent Airway, No signs of Nausea or vomiting and Pain level controlled              Post-op Vital Signs: Reviewed and stable  Last Vitals:  Filed Vitals:   04/08/15 1240  BP: 128/73  Pulse: 80  Temp:   Resp: 16    Complications: No apparent anesthesia complications

## 2015-04-09 ENCOUNTER — Encounter (HOSPITAL_COMMUNITY): Payer: Self-pay | Admitting: Vascular Surgery

## 2015-04-12 ENCOUNTER — Inpatient Hospital Stay (HOSPITAL_COMMUNITY)
Admission: EM | Admit: 2015-04-12 | Discharge: 2015-04-15 | DRG: 682 | Disposition: A | Discharge status: Home Health | Payer: Medicare Other | Attending: Internal Medicine | Admitting: Internal Medicine

## 2015-04-12 ENCOUNTER — Emergency Department (HOSPITAL_COMMUNITY): Payer: Medicare Other

## 2015-04-12 ENCOUNTER — Encounter (HOSPITAL_COMMUNITY): Payer: Self-pay | Admitting: *Deleted

## 2015-04-12 DIAGNOSIS — N179 Acute kidney failure, unspecified: Secondary | ICD-10-CM | POA: Diagnosis not present

## 2015-04-12 DIAGNOSIS — Z79899 Other long term (current) drug therapy: Secondary | ICD-10-CM

## 2015-04-12 DIAGNOSIS — N184 Chronic kidney disease, stage 4 (severe): Secondary | ICD-10-CM | POA: Diagnosis present

## 2015-04-12 DIAGNOSIS — E859 Amyloidosis, unspecified: Secondary | ICD-10-CM | POA: Diagnosis not present

## 2015-04-12 DIAGNOSIS — I129 Hypertensive chronic kidney disease with stage 1 through stage 4 chronic kidney disease, or unspecified chronic kidney disease: Secondary | ICD-10-CM | POA: Diagnosis present

## 2015-04-12 DIAGNOSIS — I10 Essential (primary) hypertension: Secondary | ICD-10-CM | POA: Diagnosis not present

## 2015-04-12 DIAGNOSIS — M1990 Unspecified osteoarthritis, unspecified site: Secondary | ICD-10-CM | POA: Diagnosis not present

## 2015-04-12 DIAGNOSIS — R531 Weakness: Secondary | ICD-10-CM

## 2015-04-12 DIAGNOSIS — G9340 Encephalopathy, unspecified: Secondary | ICD-10-CM | POA: Diagnosis present

## 2015-04-12 DIAGNOSIS — R7989 Other specified abnormal findings of blood chemistry: Secondary | ICD-10-CM | POA: Diagnosis not present

## 2015-04-12 DIAGNOSIS — M7989 Other specified soft tissue disorders: Secondary | ICD-10-CM | POA: Diagnosis not present

## 2015-04-12 DIAGNOSIS — R410 Disorientation, unspecified: Secondary | ICD-10-CM

## 2015-04-12 DIAGNOSIS — D649 Anemia, unspecified: Secondary | ICD-10-CM | POA: Diagnosis not present

## 2015-04-12 DIAGNOSIS — R748 Abnormal levels of other serum enzymes: Secondary | ICD-10-CM | POA: Diagnosis present

## 2015-04-12 DIAGNOSIS — R778 Other specified abnormalities of plasma proteins: Secondary | ICD-10-CM

## 2015-04-12 DIAGNOSIS — R630 Anorexia: Secondary | ICD-10-CM | POA: Diagnosis not present

## 2015-04-12 DIAGNOSIS — N19 Unspecified kidney failure: Secondary | ICD-10-CM | POA: Diagnosis present

## 2015-04-12 DIAGNOSIS — J449 Chronic obstructive pulmonary disease, unspecified: Secondary | ICD-10-CM | POA: Diagnosis not present

## 2015-04-12 DIAGNOSIS — F17210 Nicotine dependence, cigarettes, uncomplicated: Secondary | ICD-10-CM | POA: Diagnosis present

## 2015-04-12 DIAGNOSIS — E860 Dehydration: Secondary | ICD-10-CM | POA: Diagnosis not present

## 2015-04-12 DIAGNOSIS — N189 Chronic kidney disease, unspecified: Secondary | ICD-10-CM | POA: Diagnosis not present

## 2015-04-12 DIAGNOSIS — Z992 Dependence on renal dialysis: Secondary | ICD-10-CM

## 2015-04-12 DIAGNOSIS — R4182 Altered mental status, unspecified: Secondary | ICD-10-CM | POA: Diagnosis not present

## 2015-04-12 DIAGNOSIS — N186 End stage renal disease: Secondary | ICD-10-CM | POA: Diagnosis present

## 2015-04-12 HISTORY — DX: Glomerular disorders in diseases classified elsewhere: N08

## 2015-04-12 HISTORY — DX: Other specified abnormalities of plasma proteins: R77.8

## 2015-04-12 HISTORY — DX: Organ-limited amyloidosis: E85.4

## 2015-04-12 LAB — RAPID URINE DRUG SCREEN, HOSP PERFORMED
Amphetamines: NOT DETECTED
BARBITURATES: NOT DETECTED
Benzodiazepines: NOT DETECTED
COCAINE: NOT DETECTED
Opiates: NOT DETECTED
Tetrahydrocannabinol: NOT DETECTED

## 2015-04-12 LAB — CBC WITH DIFFERENTIAL/PLATELET
Basophils Absolute: 0 10*3/uL (ref 0.0–0.1)
Basophils Relative: 0 % (ref 0–1)
EOS ABS: 0.5 10*3/uL (ref 0.0–0.7)
Eosinophils Relative: 9 % — ABNORMAL HIGH (ref 0–5)
HCT: 32 % — ABNORMAL LOW (ref 39.0–52.0)
Hemoglobin: 10.5 g/dL — ABNORMAL LOW (ref 13.0–17.0)
Lymphocytes Relative: 14 % (ref 12–46)
Lymphs Abs: 0.8 10*3/uL (ref 0.7–4.0)
MCH: 31.7 pg (ref 26.0–34.0)
MCHC: 32.8 g/dL (ref 30.0–36.0)
MCV: 96.7 fL (ref 78.0–100.0)
Monocytes Absolute: 1 10*3/uL (ref 0.1–1.0)
Monocytes Relative: 19 % — ABNORMAL HIGH (ref 3–12)
NEUTROS PCT: 58 % (ref 43–77)
Neutro Abs: 3 10*3/uL (ref 1.7–7.7)
Platelets: 180 10*3/uL (ref 150–400)
RBC: 3.31 MIL/uL — ABNORMAL LOW (ref 4.22–5.81)
RDW: 13 % (ref 11.5–15.5)
WBC: 5.2 10*3/uL (ref 4.0–10.5)

## 2015-04-12 LAB — COMPREHENSIVE METABOLIC PANEL
ALT: 6 U/L — ABNORMAL LOW (ref 17–63)
ANION GAP: 8 (ref 5–15)
AST: 14 U/L — AB (ref 15–41)
Albumin: 3.3 g/dL — ABNORMAL LOW (ref 3.5–5.0)
Alkaline Phosphatase: 48 U/L (ref 38–126)
BUN: 52 mg/dL — AB (ref 6–20)
CO2: 20 mmol/L — ABNORMAL LOW (ref 22–32)
Calcium: 8.9 mg/dL (ref 8.9–10.3)
Chloride: 114 mmol/L — ABNORMAL HIGH (ref 101–111)
Creatinine, Ser: 8.14 mg/dL — ABNORMAL HIGH (ref 0.61–1.24)
GFR, EST AFRICAN AMERICAN: 7 mL/min — AB (ref >60)
GFR, EST NON AFRICAN AMERICAN: 6 mL/min — AB (ref >60)
Glucose, Bld: 104 mg/dL — ABNORMAL HIGH (ref 65–99)
Potassium: 4.9 mmol/L (ref 3.5–5.1)
SODIUM: 142 mmol/L (ref 135–145)
TOTAL PROTEIN: 6.7 g/dL (ref 6.5–8.1)
Total Bilirubin: 0.3 mg/dL (ref 0.3–1.2)

## 2015-04-12 LAB — CBG MONITORING, ED: Glucose-Capillary: 75 mg/dL (ref 65–99)

## 2015-04-12 LAB — URINALYSIS, ROUTINE W REFLEX MICROSCOPIC
Bilirubin Urine: NEGATIVE
Glucose, UA: 250 mg/dL — AB
Ketones, ur: NEGATIVE mg/dL
LEUKOCYTES UA: NEGATIVE
Nitrite: NEGATIVE
Protein, ur: >300 mg/dL — AB
Specific Gravity, Urine: 1.014 (ref 1.005–1.030)
Urobilinogen, UA: 0.2 mg/dL (ref 0.0–1.0)
pH: 5 (ref 5.0–8.0)

## 2015-04-12 LAB — I-STAT TROPONIN, ED: Troponin i, poc: 0.27 ng/mL (ref 0.00–0.08)

## 2015-04-12 LAB — I-STAT CG4 LACTIC ACID, ED
LACTIC ACID, VENOUS: 1.96 mmol/L (ref 0.5–2.0)
Lactic Acid, Venous: 0.68 mmol/L (ref 0.5–2.0)

## 2015-04-12 LAB — APTT: APTT: 31 s (ref 24–37)

## 2015-04-12 LAB — PROTIME-INR
INR: 1.03 (ref 0.00–1.49)
Prothrombin Time: 13.7 seconds (ref 11.6–15.2)

## 2015-04-12 LAB — URINE MICROSCOPIC-ADD ON

## 2015-04-12 LAB — ETHANOL

## 2015-04-12 LAB — PHOSPHORUS: Phosphorus: 4.5 mg/dL (ref 2.5–4.6)

## 2015-04-12 MED ORDER — METOPROLOL SUCCINATE ER 25 MG PO TB24
25.0000 mg | ORAL_TABLET | Freq: Every day | ORAL | Status: DC
  Administered 2015-04-13 – 2015-04-15 (×3): 25 mg via ORAL
  Filled 2015-04-12 (×4): qty 1

## 2015-04-12 MED ORDER — ACETAMINOPHEN 325 MG PO TABS
650.0000 mg | ORAL_TABLET | Freq: Four times a day (QID) | ORAL | Status: DC | PRN
Start: 2015-04-12 — End: 2015-04-15

## 2015-04-12 MED ORDER — BISACODYL 5 MG PO TBEC: 5.0000 mg | DELAYED_RELEASE_TABLET | Freq: Every day | ORAL | Status: DC | PRN

## 2015-04-12 MED ORDER — AMLODIPINE BESYLATE 10 MG PO TABS
10.0000 mg | ORAL_TABLET | Freq: Every day | ORAL | Status: DC
  Administered 2015-04-13 – 2015-04-15 (×3): 10 mg via ORAL
  Filled 2015-04-12 (×4): qty 1

## 2015-04-12 MED ORDER — DEXTROSE-NACL 5-0.45 % IV SOLN
INTRAVENOUS | Status: DC
  Administered 2015-04-12: via INTRAVENOUS

## 2015-04-12 MED ORDER — ONDANSETRON HCL 4 MG/2ML IJ SOLN: 4.0000 mg | Freq: Four times a day (QID) | INTRAMUSCULAR | Status: DC | PRN

## 2015-04-12 MED ORDER — ACETAMINOPHEN 650 MG RE SUPP
650.0000 mg | Freq: Four times a day (QID) | RECTAL | Status: DC | PRN
  Filled 2015-04-12: qty 1

## 2015-04-12 MED ORDER — ONDANSETRON HCL 4 MG PO TABS: 4.0000 mg | ORAL_TABLET | Freq: Four times a day (QID) | ORAL | Status: DC | PRN

## 2015-04-12 MED ORDER — IPRATROPIUM-ALBUTEROL 0.5-2.5 (3) MG/3ML IN SOLN: 3.0000 mL | Freq: Four times a day (QID) | RESPIRATORY_TRACT | Status: DC | PRN

## 2015-04-12 MED ORDER — ADULT MULTIVITAMIN W/MINERALS CH
1.0000 | ORAL_TABLET | Freq: Every day | ORAL | Status: DC
  Administered 2015-04-12 – 2015-04-15 (×4): 1 via ORAL
  Filled 2015-04-12 (×5): qty 1

## 2015-04-12 MED ORDER — TAMSULOSIN HCL 0.4 MG PO CAPS
0.4000 mg | ORAL_CAPSULE | Freq: Two times a day (BID) | ORAL | Status: DC
  Administered 2015-04-12 – 2015-04-15 (×6): 0.4 mg via ORAL
  Filled 2015-04-12 (×11): qty 1

## 2015-04-12 MED ORDER — IPRATROPIUM-ALBUTEROL 0.5-2.5 (3) MG/3ML IN SOLN
3.0000 mL | Freq: Four times a day (QID) | RESPIRATORY_TRACT | Status: DC
  Filled 2015-04-12: qty 3

## 2015-04-12 MED ORDER — HEPARIN SODIUM (PORCINE) 5000 UNIT/ML IJ SOLN
5000.0000 [IU] | Freq: Two times a day (BID) | INTRAMUSCULAR | Status: DC
  Administered 2015-04-12 – 2015-04-14 (×3): 5000 [IU] via SUBCUTANEOUS
  Filled 2015-04-12 (×9): qty 1

## 2015-04-12 NOTE — H&P (Addendum)
Renal Service History & Physical [facility]  Shane [name] is an 74 y.o. male.   Chief Complaint: L arm swelling, confusion according to family  HPI: 74 yo with hx of HTN, CKDdue to AL amyloid, hx renal cancer brought to ED by family for confusion, not acting like himself. Labs show creat 8 (last was 6.0 two yrs ago here). Trop 0.27, EKG normal, no CP or SOB. Poor appetite x 1 week, no n/v or fatigue per patient.  Patient was initially appearing confused with orientation questions by the PA but then later was fully oriented with speaking with the RN.   No abd pain, no diarrhea, HA, blurred vision. No voiding difficulty.  Taking lasix 40 daily along with norvasc, MTP, combivent, and flomax. Denies nsaids'.   PSHx shows 653 old AVF placements in 2011 and 2012 which must have failed.    Chart review: 5/14 - 74 yo CKD due to AL amyloid, HTN, tobacco, renal cancer by hx with SOB and gross hematuria > abd CT no stones or hydro, enlarged prostate gland with protrusion into the bladder base, diffuse bladder wall thickening. Mild renal atrophy bilat. CT chest chronic lung disease with central airway thickening and pathcy dependent opacities in the th bases, aspiration not excluded. No findings suspicious for metastatic disease    Past Medical History  Past Medical History  Diagnosis Date  . Hypertension   . Incarcerated inguinal hernia, unilateral     right side  . Tobacco abuse   . Varicocele   . Cancer     Renal  . Shortness of breath dyspnea   . Arthritis     bursitis in hip  . 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 Av fistula placement, radiocephalic  01/30/2011    Right w/ ligation of competing venous branch by Dr. Leonides SakeBrian Chen  . Av fistula placement, brachiocephalic  10/19/2010    LEFT  . Av fistula placement, radiocephalic  09/13/2010    LEFT  .  Colonoscopy    . Bascilic vein transposition Left 04/08/2015    Procedure: BASILIC VEIN TRANSPOSITION;  Surgeon: Pryor OchoaJames D Lawson, MD;  [facility];  Service: Vascular;  Laterality: Left;   Family History  Family History  Problem Relation Age of Onset  . Asthma Mother   . Hypertension Mother   . Alcohol abuse Father   . Hypertension Father   . Hypertension Sister   . Diabetes Sister   . Heart disease Sister   . Hypertension Brother   . Peripheral vascular disease Brother   . Hypertension Daughter   . Heart disease Daughter    Social History  reports that he has been smoking Cigarettes.  He has a 25 pack-year smoking history. He has never used smokeless tobacco. He reports that he does not drink alcohol or use illicit drugs. Allergies No Known Allergies Home medications Prior to Admission medications   Medication Sig Start Date End Date Taking? Authorizing Provider  albuterol-ipratropium (COMBIVENT) 18-103 MCG/ACT inhaler Inhale 2 puffs into the lungs every 6 (six) hours as needed for wheezing. 02/11/13  Yes Genelle GatherKathryn F Glenn, MD  amLODipine (NORVASC) 10 MG tablet Take 1 tablet (10 mg total) by mouth daily. 02/12/13  Yes Genelle GatherKathryn F Glenn, MD  furosemide (LASIX) 80 MG tablet Take 80 mg by mouth daily. Take half of one daily   Yes Historical  Provider, MD  metoprolol succinate (TOPROL-XL) 50 MG 24 hr tablet Take 25 mg by mouth daily. Take with or immediately following a meal.   Yes Historical Provider, MD  oxyCODONE (ROXICODONE) 5 MG immediate release tablet Take 1 tablet (5 mg total) by mouth every 6 (six) hours as needed. 04/08/15  Yes Samantha J Rhyne, PA-C  Tamsulosin HCl (FLOMAX) 0.4 MG CAPS Take 0.4 mg by mouth 2 (two) times daily.    Yes Historical Provider, MD   Liver Function Tests  Recent Labs Lab 04/12/15 1614  AST 14*  ALT 6*  ALKPHOS 48  BILITOT 0.3  PROT 6.7  ALBUMIN 3.3*   No results for input(s): LIPASE, AMYLASE in the last 168 hours. CBC  Recent Labs Lab  04/08/15 0612 04/12/15 1614  WBC  --  5.2  NEUTROABS  --  3.0  HGB 13.3 10.5*  HCT 39.0 32.0*  MCV  --  96.7  PLT  --  180   Basic Metabolic Panel  Recent Labs Lab 04/08/15 0612 04/12/15 1614  NA 144 142  K 4.3 4.9  CL  --  114*  CO2  --  20*  GLUCOSE 82 104*  BUN  --  52*  CREATININE  --  8.14*  CALCIUM  --  8.9    Filed Vitals:   04/12/15 1645 04/12/15 1745 04/12/15 1800 04/12/15 1802  BP: 129/71 135/82 128/78 128/78  Pulse: 61 62 61 69  Temp:      TempSrc:      Resp: Height:      Weight:      SpO2: 100% 100% 98% 97%   Exam: Alert, a bit dishevelled, joking, +mild uremic fetor No rash, cyanosis or gangrene Sclera anicteric, throat dry No jvd Chest dec'd BS throughout , no rales or wheezing RRR no mrg Abd soft, ntnd no mass, hsm or ascites No joint deformity or effusion LUA AVF wounds intact, +bruit,  minimal erythema, mild edema L forearm No LE or UE edema Neuro is OX 3  Na 142, K 4.9  CO2 20  BUN 52  Creat 8.14    Alb 3.3 WBC 5.2  Hb 10.5 EKG NSR, RBBB no ischemic changes ECHO 2014 - concentric LVH, speckled septum possible amyloid. EF 45-50%. Inferopoost hypokinesis. RV normal.    Assessment: 1. Confusion - prob uremia, creat up from baseline 5-6 (baseline per pt), no fever, pain meds for AVF surgery could also be contributing. Admit, hold lasix (not eating well), give IVF's, recheck creat am, consider renal consult (see Dr Abel Presto q 6 mos). Had AVF placed 2. LUA AVF placed 6/30, post op skin changes, no infection 3. Anorexia - from pain meds for arm surg, vs uremia 4. Renal failure / AL amyloidosis per biopsy (acc to notes in EPIC) - took "chemo" for this a few years ago, here in [place] per pt. Doesn't remember a lot of details.  5. Vol depletion looks dry on exam, taking lasix and not eating 6. COPD/ tobacco use - chronic lung changes on CT chest in 2014 7. ^trop - 0.27, no EKG changes and no CP/ SOB. Repeat in am, poss ^ due to CKD  and/or cardiac amyloid (+echo amyloid changes in 2014)   Plan- admit OBS status, IVF', repeat trop and creat in am, follow MS, hold narcotics/ lasix, cont BP meds hold SBP < 120   Rob Lataisha Colan MD (pgr) [phone]    ([phone] 04/12/2015, 6:18 PM

## 2015-04-12 NOTE — ED Notes (Signed)
MD at the bedside  

## 2015-04-12 NOTE — ED Notes (Signed)
Admitting MD at the bedside.  

## 2015-04-12 NOTE — ED Notes (Signed)
Phlebotomy at the bedside  

## 2015-04-12 NOTE — ED Notes (Signed)
Pt states that his family brought him in because they think he is crazy. Pt will not answer why he is here. Family brought into the room and states that he was c/o about his left arm being swollen earlier today (recent graft placement). Pt denies all of this.

## 2015-04-12 NOTE — ED Notes (Signed)
Patient returned from CT

## 2015-04-12 NOTE — ED Notes (Signed)
Talked with Admitting MD about patient. Patient comfortable.

## 2015-04-12 NOTE — ED Notes (Signed)
Spoke with Son about father's care. Made aware to speak with MD about options. Patient does not want to stay. Patient wants to go home.

## 2015-04-12 NOTE — ED Provider Notes (Signed)
CSN: [id number]     Arrival date & time 04/12/15  1550 History   First MD Initiated Contact with Patient 04/12/15 1559     No chief complaint on file.    (Consider location/radiation/quality/duration/timing/severity/associated sxs/prior Treatment) The history is provided by the patient and medical records. No language interpreter was used.     Shane [name] is a 74 y.o. male  with a hx of HTN, tobacco abuse, arthritis, Stage IV CKD presents to the Emergency Department.  He reports he does not know why he is here.   He denies all somatic complaints.  Reports normal appetite and BMs.    Level 5 caveat for AMS.    Hx provided by pt's son.  He reports that his left arm has been swollen and the family was concerned.  Pt's son reports he was less interactive yesterday at a family gathering.  Son reports that his sister was complaining of the pt acting "crazy."    Past Medical History  Diagnosis Date  . Hypertension   . Incarcerated inguinal hernia, unilateral     right side  . Tobacco abuse   . Varicocele   . Cancer     Renal  . Shortness of breath dyspnea   . Arthritis     bursitis in hip  . 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 Av fistula placement, radiocephalic  01/30/2011    Right w/ ligation of competing venous branch by Dr. Leonides SakeBrian Chen  . Av fistula placement, brachiocephalic  10/19/2010    LEFT  . Av fistula placement, radiocephalic  09/13/2010    LEFT  . Colonoscopy    . Bascilic vein transposition Left 04/08/2015    Procedure: BASILIC VEIN TRANSPOSITION;  Surgeon: Pryor OchoaJames D Lawson, MD;  [facility];  Service: Vascular;  Laterality: Left;   Family History  Problem Relation Age of Onset  . Asthma Mother   . Hypertension Mother   . Alcohol abuse Father   . Hypertension Father   . Hypertension Sister   . Diabetes Sister   . Heart disease Sister   . Hypertension Brother   .  Peripheral vascular disease Brother   . Hypertension Daughter   . Heart disease Daughter    History  Substance Use Topics  . Smoking status: Current Every Day Smoker -- 0.50 packs/day for 50 years    Types: Cigarettes  . Smokeless tobacco: Never Used  . Alcohol Use: No    Review of Systems  Constitutional: Negative for fever, diaphoresis, appetite change, fatigue and unexpected weight change.  HENT: Negative for mouth sores.   Eyes: Negative for visual disturbance.  Respiratory: Negative for cough, chest tightness, shortness of breath and wheezing.   Cardiovascular: Negative for chest pain.  Gastrointestinal: Negative for nausea, vomiting, abdominal pain, diarrhea and constipation.  Endocrine: Negative for polydipsia, polyphagia and polyuria.  Genitourinary: Negative for dysuria, urgency, frequency and hematuria.  Musculoskeletal: Negative for back pain and neck stiffness.  Skin: Negative for rash.  Allergic/Immunologic: Negative for immunocompromised state.  Neurological: Negative for syncope, light-headedness and headaches.  Hematological: Does not bruise/bleed easily.  Psychiatric/Behavioral: Negative for sleep disturbance. The patient is not nervous/anxious.       Allergies  Review of patient's allergies indicates no known allergies.  Home Medications   Prior to Admission medications   Medication Sig Start Date End Date Taking? Authorizing Provider  albuterol-ipratropium (COMBIVENT)  18-103 MCG/ACT inhaler Inhale 2 puffs into the lungs every 6 (six) hours as needed for wheezing. 02/11/13  Yes Genelle Gather, MD  amLODipine (NORVASC) 10 MG tablet Take 1 tablet (10 mg total) by mouth daily. 02/12/13  Yes Genelle Gather, MD  furosemide (LASIX) 80 MG tablet Take 80 mg by mouth daily. Take half of one daily   Yes Historical Provider, MD  metoprolol succinate (TOPROL-XL) 50 MG 24 hr tablet Take 25 mg by mouth daily. Take with or immediately following a meal.   Yes Historical  Provider, MD  oxyCODONE (ROXICODONE) 5 MG immediate release tablet Take 1 tablet (5 mg total) by mouth every 6 (six) hours as needed. 04/08/15  Yes Samantha J Rhyne, PA-C  Tamsulosin HCl (FLOMAX) 0.4 MG CAPS Take 0.4 mg by mouth 2 (two) times daily.    Yes Historical Provider, MD   BP 128/78 mmHg  Pulse 69  Temp(Src) 97.9 F (36.6 C) (Oral)  Resp 18  Ht 5\' 11"  (1.803 m)  Wt 153 lb 1 oz (69.429 kg)  BMI 21.36 kg/m2  SpO2 97% Physical Exam  Constitutional: He appears well-developed and well-nourished. No distress.  Awake, alert, nontoxic appearance  HENT:  Head: Normocephalic and atraumatic.  Mouth/Throat: Oropharynx is clear and moist. No oropharyngeal exudate.  Eyes: Conjunctivae are normal. No scleral icterus.  Neck: Normal range of motion. Neck supple.  Cardiovascular: Normal rate, regular rhythm, normal heart sounds and intact distal pulses.   No murmur heard. Pulmonary/Chest: Effort normal and breath sounds normal. No respiratory distress. He has no wheezes.  Equal chest expansion  Abdominal: Soft. Bowel sounds are normal. He exhibits no mass. There is no tenderness. There is no rebound and no guarding.  Musculoskeletal: Normal range of motion. He exhibits no edema.  Left arm is swollen with minimal ecchymosis and slightly increased warmth in the left lower arm Incision does not appear inflamed; there is no purulent drainage No TTP of the surgical site Palpable thrill of the fistula FROM of the LUE  Neurological: He is alert.  Speech is clear and goal oriented Moves extremities without ataxia Pt is oriented to person only - unable to orient to place, time or event Sensation intact to the LUE Strong grip strength bilaterally  Skin: Skin is warm and dry. He is not diaphoretic.  Psychiatric: He has a normal mood and affect.  Nursing note and vitals reviewed.   ED Course  Procedures (including critical care time) Labs Review Labs Reviewed  CBC WITH DIFFERENTIAL/PLATELET  - Abnormal; Notable for the following:    RBC 3.31 (*)    Hemoglobin 10.5 (*)    HCT 32.0 (*)    Monocytes Relative 19 (*)    Eosinophils Relative 9 (*)    All other components within normal limits  COMPREHENSIVE METABOLIC PANEL - Abnormal; Notable for the following:    Chloride 114 (*)    CO2 20 (*)    Glucose, Bld 104 (*)    BUN 52 (*)    Creatinine, Ser 8.14 (*)    Albumin 3.3 (*)    AST 14 (*)    ALT 6 (*)    GFR calc non Af Amer 6 (*)    GFR calc Af Amer 7 (*)    All other components within normal limits  I-STAT TROPOININ, ED - Abnormal; Notable for the following:    Troponin i, poc 0.27 (*)    All other components within normal limits  CULTURE, BLOOD (ROUTINE X  2)  CULTURE, BLOOD (ROUTINE X 2)  APTT  URINE RAPID DRUG SCREEN, HOSP PERFORMED  ETHANOL  PROTIME-INR  URINALYSIS, ROUTINE W REFLEX MICROSCOPIC (NOT AT [facility])  I-STAT CG4 LACTIC ACID, ED  CBG MONITORING, ED    Imaging Review Ct Head Wo Contrast  04/12/2015   CLINICAL DATA:  Altered mental status.  Delirium  EXAM: CT HEAD WITHOUT CONTRAST  TECHNIQUE: Contiguous axial images were obtained from the base of the skull through the vertex without intravenous contrast.  COMPARISON:  None.  FINDINGS: There is mild generalized brain atrophy. The brainstem and cerebellum are normal. The cerebral hemispheres show a low density in the deep white matter consistent with ordinary small vessel disease. No sign of acute infarction, mass lesion, hemorrhage, hydrocephalus or extra-axial collection. The calvarium is unremarkable. Sinuses, middle ears and mastoids are clear.  IMPRESSION: No acute finding by CT. 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   Electronically Signed   By: Paulina Fusi M.D.   On: 04/12/2015 17:26     EKG Interpretation   Date/Time:  Monday April 12 2015 16:41:04 EDT Ventricular Rate:  61 PR Interval:  135 QRS Duration: 127 QT Interval:  455 QTC Calculation: 458 R Axis:    42 Text Interpretation:  Sinus rhythm Right bundle branch block No  significant change since last tracing Confirmed by Mirian Mo [phone])  on 04/12/2015 4:53:00 PM      MDM   Final diagnoses:  Confusion  Acute on chronic renal failure  Generalized weakness  Elevated troponin    Shane Malling presents with AMS increasing over several days per family.  Pt denies complaints.  Alert to person only. Will begin work-up.  BP 137/80 mmHg  Pulse 82  Temp(Src) 97.9 F (36.6 C) (Oral)  Resp 16  Ht  (1.803 m)  Wt 153 lb 1 oz (69.429 kg)  BMI 21.36 kg/m2  SpO2 99%  4:57 PM Pt with elevated troponin at 0.27.  No ECG changes from previous.  Pt denies CP, SOB, nausea.  Pt without cardiac hx. Record review without previous troponin elevations.    5:34 PM Pt with generalized weakness with questionable new dementia and elevated troponin likely 2/2 his acute on chronic renal failure.  He will need admission for further work-up.  UDS pending.    The patient was discussed with and seen by Dr. Littie Deeds who agrees with the treatment plan.  6:17 PM Pt discussed with Dr. Arlean Hopping who will admit for further monitoring and trending troponin.    BP 128/78 mmHg  Pulse 69  Temp(Src) 97.9 F (36.6 C) (Oral)  Resp 18  Ht  (1.803 m)  Wt 153 lb 1 oz (69.429 kg)  BMI 21.36 kg/m2  SpO2 97%   Dierdre Forth, PA-C 04/12/15 1821  Mirian Mo, MD 04/12/15 2328

## 2015-04-12 NOTE — ED Notes (Signed)
PA Dahlia ClientHannah made aware patient is going to stay. Calling admitting.

## 2015-04-13 DIAGNOSIS — N189 Chronic kidney disease, unspecified: Secondary | ICD-10-CM | POA: Diagnosis not present

## 2015-04-13 DIAGNOSIS — Z79899 Other long term (current) drug therapy: Secondary | ICD-10-CM | POA: Diagnosis not present

## 2015-04-13 DIAGNOSIS — F17210 Nicotine dependence, cigarettes, uncomplicated: Secondary | ICD-10-CM | POA: Diagnosis present

## 2015-04-13 DIAGNOSIS — I10 Essential (primary) hypertension: Secondary | ICD-10-CM | POA: Diagnosis not present

## 2015-04-13 DIAGNOSIS — M7989 Other specified soft tissue disorders: Secondary | ICD-10-CM | POA: Diagnosis present

## 2015-04-13 DIAGNOSIS — D649 Anemia, unspecified: Secondary | ICD-10-CM | POA: Diagnosis present

## 2015-04-13 DIAGNOSIS — E859 Amyloidosis, unspecified: Secondary | ICD-10-CM | POA: Diagnosis present

## 2015-04-13 DIAGNOSIS — J449 Chronic obstructive pulmonary disease, unspecified: Secondary | ICD-10-CM | POA: Diagnosis present

## 2015-04-13 DIAGNOSIS — R7989 Other specified abnormal findings of blood chemistry: Secondary | ICD-10-CM

## 2015-04-13 DIAGNOSIS — D631 Anemia in chronic kidney disease: Secondary | ICD-10-CM | POA: Diagnosis not present

## 2015-04-13 DIAGNOSIS — R410 Disorientation, unspecified: Secondary | ICD-10-CM | POA: Diagnosis not present

## 2015-04-13 DIAGNOSIS — I120 Hypertensive chronic kidney disease with stage 5 chronic kidney disease or end stage renal disease: Secondary | ICD-10-CM | POA: Diagnosis not present

## 2015-04-13 DIAGNOSIS — M1990 Unspecified osteoarthritis, unspecified site: Secondary | ICD-10-CM | POA: Diagnosis present

## 2015-04-13 DIAGNOSIS — E860 Dehydration: Secondary | ICD-10-CM | POA: Diagnosis present

## 2015-04-13 DIAGNOSIS — R748 Abnormal levels of other serum enzymes: Secondary | ICD-10-CM | POA: Diagnosis present

## 2015-04-13 DIAGNOSIS — G9340 Encephalopathy, unspecified: Secondary | ICD-10-CM | POA: Diagnosis not present

## 2015-04-13 DIAGNOSIS — N184 Chronic kidney disease, stage 4 (severe): Secondary | ICD-10-CM | POA: Diagnosis not present

## 2015-04-13 DIAGNOSIS — N179 Acute kidney failure, unspecified: Secondary | ICD-10-CM | POA: Diagnosis not present

## 2015-04-13 DIAGNOSIS — I129 Hypertensive chronic kidney disease with stage 1 through stage 4 chronic kidney disease, or unspecified chronic kidney disease: Secondary | ICD-10-CM | POA: Diagnosis present

## 2015-04-13 DIAGNOSIS — R630 Anorexia: Secondary | ICD-10-CM | POA: Diagnosis present

## 2015-04-13 LAB — CBC
HEMATOCRIT: 31.7 % — AB (ref 39.0–52.0)
HEMOGLOBIN: 10.4 g/dL — AB (ref 13.0–17.0)
MCH: 32 pg (ref 26.0–34.0)
MCHC: 32.8 g/dL (ref 30.0–36.0)
MCV: 97.5 fL (ref 78.0–100.0)
Platelets: 187 10*3/uL (ref 150–400)
RBC: 3.25 MIL/uL — ABNORMAL LOW (ref 4.22–5.81)
RDW: 13.1 % (ref 11.5–15.5)
WBC: 4.6 10*3/uL (ref 4.0–10.5)

## 2015-04-13 LAB — BASIC METABOLIC PANEL
Anion gap: 6 (ref 5–15)
BUN: 50 mg/dL — AB (ref 6–20)
CO2: 22 mmol/L (ref 22–32)
Calcium: 8.9 mg/dL (ref 8.9–10.3)
Chloride: 114 mmol/L — ABNORMAL HIGH (ref 101–111)
Creatinine, Ser: 7.9 mg/dL — ABNORMAL HIGH (ref 0.61–1.24)
GFR calc Af Amer: 7 mL/min — ABNORMAL LOW (ref >60)
GFR, EST NON AFRICAN AMERICAN: 6 mL/min — AB (ref >60)
Glucose, Bld: 101 mg/dL — ABNORMAL HIGH (ref 65–99)
Potassium: 4.6 mmol/L (ref 3.5–5.1)
Sodium: 142 mmol/L (ref 135–145)

## 2015-04-13 LAB — PHOSPHORUS: Phosphorus: 4.2 mg/dL (ref 2.5–4.6)

## 2015-04-13 LAB — GLUCOSE, CAPILLARY: Glucose-Capillary: 83 mg/dL (ref 65–99)

## 2015-04-13 LAB — TROPONIN I: TROPONIN I: 0.04 ng/mL — AB (ref <0.031)

## 2015-04-13 MED ORDER — CAMPHOR-MENTHOL 0.5-0.5 % EX LOTN
TOPICAL_LOTION | CUTANEOUS | Status: DC | PRN
  Administered 2015-04-13: 22:00:00 via TOPICAL
  Filled 2015-04-13 (×2): qty 222

## 2015-04-13 MED ORDER — DEXTROSE-NACL 5-0.9 % IV SOLN
INTRAVENOUS | Status: DC
  Administered 2015-04-13: 14:00:00 via INTRAVENOUS

## 2015-04-13 MED ORDER — HALOPERIDOL LACTATE 5 MG/ML IJ SOLN
2.5000 mg | Freq: Once | INTRAMUSCULAR | Status: AC
  Administered 2015-04-13: 2.5 mg via INTRAVENOUS
  Filled 2015-04-13: qty 1

## 2015-04-13 NOTE — Consult Note (Signed)
Reason for Consult: Acute renal failure on chronic kidney disease stage IV versus progression Referring Physician: Ramiro Harvestaniel Thompson M.D. [facility]  HPI:  74 year old man with a history of progressive chronic kidney disease-stage IV (recent creatinine of 5.7 back in May) from AL amyloidosis and underlying hypertension. He is status post single-stage basilic vein transposition and 04/08/15 by Dr. Hart RochesterLawson. Brought in yesterday by his family who were concerned with changes in mentation-confusion and not acting like he does at baseline. There was some preceding history of poor oral intake as he continued to take his furosemide 40 mg daily. Urine drug screen negative for opiates.  He denies any nausea or vomiting and does not have any dysgeusia. Denies any chest pain or shortness of breath-although his daughter who stands behind him states that he had some shortness of breath. He denies any abdominal pain or jerky limb movements.  Past Medical History  Diagnosis Date  . Hypertension   . Incarcerated inguinal hernia, unilateral     right side  . Tobacco abuse   . Varicocele   . 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 Arthritis     bursitis in hip  . 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 Av fistula placement, radiocephalic  01/30/2011    Right w/ ligation of competing venous branch by Dr. Leonides SakeBrian Chen  . Av fistula placement, brachiocephalic  10/19/2010    LEFT  . Av fistula placement, radiocephalic  09/13/2010    LEFT  . Colonoscopy    . Bascilic vein transposition Left 04/08/2015    Procedure: BASILIC VEIN TRANSPOSITION;  Surgeon: Pryor OchoaJames D Lawson, MD;  [facility];  Service: Vascular;  Laterality: Left;    Family History  Problem Relation Age of Onset  . Asthma Mother   . Hypertension Mother   . Alcohol abuse Father   . Hypertension Father   .  Hypertension Sister   . Diabetes Sister   . Heart disease Sister   . Hypertension Brother   . Peripheral vascular disease Brother   . Hypertension Daughter   . Heart disease Daughter     Social History:  reports that he has been smoking Cigarettes.  He has a 25 pack-year smoking history. He has never used smokeless tobacco. He reports that he does not drink alcohol or use illicit drugs.  Allergies: No Known Allergies  Medications:  Scheduled: . amLODipine  10 mg Oral Daily  . heparin  5,000 Units Subcutaneous Q12H  . metoprolol succinate  25 mg Oral Daily  . multivitamin with minerals  1 tablet Oral Daily  . tamsulosin  0.4 mg Oral BID    BMP Latest Ref Rng 04/12/2015 04/08/2015 02/11/2013  Glucose 65 - 99 mg/dL 161(W104(H) 82 78  BUN 6 - 20 mg/dL 96(E52(H) - 45(W30(H)  Creatinine 0.61 - 1.24 mg/dL 0.98(J8.14(H) - 1.91(Y3.90(H)  Sodium 135 - 145 mmol/L 142 144 143  Potassium 3.5 - 5.1 mmol/L 4.9 4.3 3.7  Chloride 101 - 111 mmol/L 114(H) - 108  CO2 22 - 32 mmol/L 20(L) - 24  Calcium 8.9 - 10.3 mg/dL 8.9 - 8.6    CBC Latest Ref Rng 04/12/2015 04/08/2015 02/10/2013  WBC 4.0 - 10.5 K/uL 5.2 - 4.3  Hemoglobin 13.0 - 17.0 g/dL 10.5(L) 13.3 13.6  Hematocrit 39.0 - 52.0 % 32.0(L) 39.0 39.0  Platelets 150 -  400 K/uL 180 - 132(L)      Ct Head Wo Contrast  04/12/2015   CLINICAL DATA:  Altered mental status.  Delirium  EXAM: CT HEAD WITHOUT CONTRAST  TECHNIQUE: Contiguous axial images were obtained from the base of the skull through the vertex without intravenous contrast.  COMPARISON:  None.  FINDINGS: There is mild generalized brain atrophy. The brainstem and cerebellum are normal. The cerebral hemispheres show a low density in the deep white matter consistent with ordinary small vessel disease. No sign of acute infarction, mass lesion, hemorrhage, hydrocephalus or extra-axial collection. The calvarium is unremarkable. Sinuses, middle ears and mastoids are clear.  IMPRESSION: No acute finding by CT. Atrophy and chronic  small-vessel disease of the hemispheric white matter.   Electronically Signed   By: Paulina Fusi M.D.   On: 04/12/2015 17:26    Review of Systems  Constitutional: Negative.   HENT: Negative.   Eyes: Negative.   Respiratory: Negative.   Cardiovascular: Negative.   Gastrointestinal: Negative.   Genitourinary: Negative.   Musculoskeletal: Negative.   Skin: Negative.   Neurological: Negative.    Blood pressure 163/80, pulse 72, temperature 98 F (36.7 C), temperature source Oral, resp. rate 20, height  (1.803 m), weight 69.7 kg (153 lb 10.6 oz), SpO2 99 %. Physical Exam  Nursing note and vitals reviewed. Constitutional: He is oriented to person, place, and time. He appears well-developed and well-nourished. No distress.  HENT:  Head: Normocephalic and atraumatic.  Nose: Nose normal.  Eyes: EOM are normal. Pupils are equal, round, and reactive to light. No scleral icterus.  Neck: Normal range of motion. Neck supple. No JVD present.  Cardiovascular: Normal rate and regular rhythm.   No murmur heard. Respiratory: Effort normal and breath sounds normal. He has no wheezes. He has no rales.  GI: Soft. Bowel sounds are normal. He exhibits no distension. There is no tenderness.  Musculoskeletal: He exhibits no edema.  Neurological: He is alert and oriented to person, place, and time.  No asterixis  Skin: Skin is warm and dry. No rash noted. No erythema.  Psychiatric: He has a normal mood and affect.    Assessment/Plan: 1. Acute renal failure on chronic kidney disease stage IV versus progression-suspected to possibly be hemodynamically mediated postoperatively with decreased oral intake and continued Lasix use. Gentle hydration and recheck labs. No compelling indications at this time to start dialysis however, if dialysis needed, he will need a tunneled dialysis catheter placement. Clinically, he appears to be euvolemic to slightly hypovolemic and does not have any asterixis/pericardial  rub. 2. Altered mentation: Suspected to be from uremia versus sedation/analgesia associated postoperatively. Continue to monitor with intravenous fluids and consider hemodialysis if does not improve. 3. Anemia: Lower hemoglobin compared to last week-possibly surgical losses. Check iron studies and plan on ESA 4. Hypertension: Furosemide hold, resumed amlodipine. Monitor closely for further adjustments of therapy-ACE inhibitor/ARB discontinued when declining GFR noted 2 months ago. 5. Metabolic bone disease: Check phosphorus with next labs.  Cataleyah Colborn K. 04/13/2015, 10:56 AM

## 2015-04-13 NOTE — Progress Notes (Signed)
[facility] PROGRESS NOTE  Shane [name] L Jankowiak ZOX:[id number] DOB: 1941-05-17 DOA: 04/12/2015 PCP: Shane [name],Shane L, MD  Assessment/Plan: #1 acute on chronic kidney disease stage IV versus progressive kidney disease Patient currently asymptomatic. Patient denies any shortness of breath. Patient had his baseline mentation per daughter. Labs pending. Continue gentle hydration. Nephrology has been consulted and following along.  #2 acute encephalopathy Questionable etiology. Concern for uremia. Patient currently at his baseline. CT head negative. Follow.  #3 hypertension Continue toprol XL, norvasc, flomax.  #4 anemia Hemoglobin was 10.5 yesterday. Labs pending. Check an anemia panel. Follow H&H.  #5 left upper extremity AV fistula placed 04/08/2015.   #6 dehydration Gentle hydration.  #7 COPD Stable.  #8 elevated troponin Patient with no EKG changes. Patient asymptomatic. Repeat troponin pending. Follow.  #9 prophylaxis Heparin for DVT prophylaxis.     Code Status: Full Family Communication: Updated patient and daughter at bedside. Disposition Plan: Home if remains stable and no worsening renal function tomorrow 04/14/2015   Consultants:  Nephrology: Dr. Allena KatzPatel 04/13/2015  Procedures:  CT head without contrast 04/12/2015  Antibiotics:  None  HPI/Subjective: Patient jovial. No complaints. Patient denies SOB. No CP. Per daughter patient at baseline.  Objective: Filed Vitals:   04/13/15 1018  BP: 163/80  Pulse: 72  Temp: 98 F (36.7 C)  Resp: 20    Intake/Output Summary (Last 24 hours) at 04/13/15 1117 Last data filed at 04/13/15 1020  Gross per 24 hour  Intake 1082.5 ml  Output      0 ml  Net 1082.5 ml   Filed Weights   04/12/15 1559 04/12/15 2048  Weight: 69.429 kg (153 lb 1 oz) 69.7 kg (153 lb 10.6 oz)    Exam:   General:  NAD  Cardiovascular: RRR  Respiratory: CTAB  Abdomen: Soft/NT/ND/+BS  Musculoskeletal: No c/c/e  Data  Reviewed: Basic Metabolic Panel:  Recent Labs Lab 04/08/15 0612 04/12/15 1614 04/12/15 2208  NA 144 142  --   K 4.3 4.9  --   CL  --  114*  --   CO2  --  20*  --   GLUCOSE 82 104*  --   BUN  --  52*  --   CREATININE  --  8.14*  --   CALCIUM  --  8.9  --   PHOS  --   --  4.5   Liver Function Tests:  Recent Labs Lab 04/12/15 1614  AST 14*  ALT 6*  ALKPHOS 48  BILITOT 0.3  PROT 6.7  ALBUMIN 3.3*   No results for input(s): LIPASE, AMYLASE in the last 168 hours. No results for input(s): AMMONIA in the last 168 hours. CBC:  Recent Labs Lab 04/08/15 0612 04/12/15 1614  WBC  --  5.2  NEUTROABS  --  3.0  HGB 13.3 10.5*  HCT 39.0 32.0*  MCV  --  96.7  PLT  --  180   Cardiac Enzymes: No results for input(s): CKTOTAL, CKMB, CKMBINDEX, TROPONINI in the last 168 hours. BNP (last 3 results) No results for input(s): BNP in the last 8760 hours.  ProBNP (last 3 results) No results for input(s): PROBNP in the last 8760 hours.  CBG:  Recent Labs Lab 04/12/15 1700 04/13/15 0743  GLUCAP 75 83    No results found for this or any previous visit (from the past 240 hour(s)).   Studies: Ct Head Wo Contrast  04/12/2015   CLINICAL DATA:  Altered mental status.  Delirium  EXAM: CT HEAD WITHOUT CONTRAST  TECHNIQUE: Contiguous axial images were obtained from the base of the skull through the vertex without intravenous contrast.  COMPARISON:  None.  FINDINGS: There is mild generalized brain atrophy. The brainstem and cerebellum are normal. The cerebral hemispheres show a low density in the deep white matter consistent with ordinary small vessel disease. No sign of acute infarction, mass lesion, hemorrhage, hydrocephalus or extra-axial collection. The calvarium is unremarkable. Sinuses, middle ears and mastoids are clear.  IMPRESSION: No acute finding by CT. 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   Electronically Signed   By: Paulina Fusi M.D.   On:  04/12/2015 17:26    Scheduled Meds: . amLODipine  10 mg Oral Daily  . heparin  5,000 Units Subcutaneous Q12H  . metoprolol succinate  25 mg Oral Daily  . multivitamin with minerals  1 tablet Oral Daily  . tamsulosin  0.4 mg Oral BID   Continuous Infusions: . dextrose 5 % and 0.45% NaCl 75 mL/hr at 04/12/15 2334    Principal Problem:   Acute on chronic renal failure Stage IV Active Problems:   Acute encephalopathy   Acute renal failure   CKD (chronic kidney disease), stage IV   Anorexia   Elevated troponin   Amyloidosis   Uremia    Time spent: 35 mins    [facility] MD [facility] Pager [phone]. If 7PM-7AM, please contact night-coverage at [email or web address], password [facility] 04/13/2015, 11:17 AM

## 2015-04-13 NOTE — Progress Notes (Signed)
04/12/2015 9:30 PM  Patient arrived to unit via stretcher with nurse tech. Patient alert and oriented x4. Patient oriented to room, staff and unit. Patient placed on tele, CCMD notified. Patient's family at bedside. Patient's skin assessment completed with Nancy MarusNikki Murphy, RN. Patient has eczema on chest, both arms and legs. Dryness of the skin was also noted. Patient IV dry, clean and intact. Safety Fall Prevention Plan was given, discussed and signed by patient. Orders have been reviewed and implemented. Will continue to monitor the patient. Call light has been placed within reach and bed alarm has been activated.   Rivka BarbaraZenab Lymon Kidney BSN, RN  Phone Number: [phone]

## 2015-04-13 NOTE — Progress Notes (Signed)
RN was called into patient's room by sitter.  Per sitter, patient has been trying to get out of the room and was being impulsive, security had been called to room as well. Patient later complained about itching (generalized eczema) and stated that he would leave if he did not get anything for itching. Sarna lotion applied.  of Haldol given per NP's order. Patient calm at the moment. Will continue to monitor.

## 2015-04-14 ENCOUNTER — Inpatient Hospital Stay (HOSPITAL_COMMUNITY): Payer: Medicare Other

## 2015-04-14 DIAGNOSIS — N189 Chronic kidney disease, unspecified: Secondary | ICD-10-CM

## 2015-04-14 DIAGNOSIS — R410 Disorientation, unspecified: Secondary | ICD-10-CM

## 2015-04-14 DIAGNOSIS — I10 Essential (primary) hypertension: Secondary | ICD-10-CM

## 2015-04-14 DIAGNOSIS — G9340 Encephalopathy, unspecified: Secondary | ICD-10-CM

## 2015-04-14 DIAGNOSIS — N179 Acute kidney failure, unspecified: Principal | ICD-10-CM

## 2015-04-14 LAB — CBC
HCT: 32 % — ABNORMAL LOW (ref 39.0–52.0)
Hemoglobin: 10.4 g/dL — ABNORMAL LOW (ref 13.0–17.0)
MCH: 31.7 pg (ref 26.0–34.0)
MCHC: 32.5 g/dL (ref 30.0–36.0)
MCV: 97.6 fL (ref 78.0–100.0)
Platelets: 181 10*3/uL (ref 150–400)
RBC: 3.28 MIL/uL — ABNORMAL LOW (ref 4.22–5.81)
RDW: 13 % (ref 11.5–15.5)
WBC: 5.2 10*3/uL (ref 4.0–10.5)

## 2015-04-14 LAB — RENAL FUNCTION PANEL
Albumin: 3.1 g/dL — ABNORMAL LOW (ref 3.5–5.0)
Anion gap: 7 (ref 5–15)
BUN: 47 mg/dL — ABNORMAL HIGH (ref 6–20)
CO2: 19 mmol/L — ABNORMAL LOW (ref 22–32)
Calcium: 8.7 mg/dL — ABNORMAL LOW (ref 8.9–10.3)
Chloride: 116 mmol/L — ABNORMAL HIGH (ref 101–111)
Creatinine, Ser: 7.92 mg/dL — ABNORMAL HIGH (ref 0.61–1.24)
GFR calc non Af Amer: 6 mL/min — ABNORMAL LOW (ref >60)
GFR, EST AFRICAN AMERICAN: 7 mL/min — AB (ref >60)
Glucose, Bld: 94 mg/dL (ref 65–99)
PHOSPHORUS: 3.8 mg/dL (ref 2.5–4.6)
Potassium: 4.3 mmol/L (ref 3.5–5.1)
Sodium: 142 mmol/L (ref 135–145)

## 2015-04-14 MED ORDER — DIPHENHYDRAMINE HCL 25 MG PO CAPS
25.0000 mg | ORAL_CAPSULE | Freq: Four times a day (QID) | ORAL | Status: DC | PRN
  Administered 2015-04-14 – 2015-04-15 (×2): 25 mg via ORAL
  Filled 2015-04-14 (×2): qty 1

## 2015-04-14 NOTE — Progress Notes (Signed)
  Echocardiogram 2D Echocardiogram has been performed.  Shane [name], Shane [name] 04/14/2015, 2:28 PM

## 2015-04-14 NOTE — Progress Notes (Signed)
Patient ID: Shane [name], male   DOB: 1940-11-22, 74 y.o.   MRN: [id number]  [facility] Progress Note    Assessment/ Plan:   1. Acute renal failure on chronic kidney disease stage IV versus progression- appears to have been hemodynamically mediated postoperatively with decreased oral intake/altered mentation. Renal function slightly improved with intravenous fluids-we'll discontinue this today and encourage oral intake. Continue to keep him off diuretic therapy for another 24-48 hours. No indications for dialysis at this time. It appears that his mental status is back to baseline according to wife and daughter. 2. Altered mentation: Suspected to be from uremia versus sedation/analgesia associated postoperatively.  mental status back to baseline per family members  3. Anemia: Lower hemoglobin compared to last week-possibly surgical losses. iron studies pending  4. Hypertension: blood pressures appear to be acceptable-discontinue intravenous fluids, continue amlodipine while holding furosemide for now  5. Metabolic bone disease: phosphorus level within acceptable limits-not on binders   Subjective:    Reports to be doing well, denies any chest pain or shortness of breath. Sitter at bedside-some agitation noted overnight.    Objective:   BP 130/73 mmHg  Pulse 83  Temp(Src) 98.9 F (37.2 C) (Axillary)  Resp 20  Ht  (1.803 m)  Wt 69.7 kg (153 lb 10.6 oz)  BMI 21.44 kg/m2  SpO2 100%  Intake/Output Summary (Last 24 hours) at 04/14/15 1022 Last data filed at 04/14/15 0814  Gross per 24 hour  Intake 1473.75 ml  Output    101 ml  Net 1372.75 ml   Weight change:   Physical Exam: Gen: Comfortably resting in bed, poorly oriented to time but oriented well to place/person CVS: Pulse regular in rate and rhythm, S1 and S2 normal  Resp: Clear to auscultation, no rales  Abd: Soft, flat, nontender  Ext: No lower extremity edema. Palpable thrill/audible bruit over left  basilic vein transposition fistula   Imaging: Ct Head Wo Contrast  04/12/2015   CLINICAL DATA:  Altered mental status.  Delirium  EXAM: CT HEAD WITHOUT CONTRAST  TECHNIQUE: Contiguous axial images were obtained from the base of the skull through the vertex without intravenous contrast.  COMPARISON:  None.  FINDINGS: There is mild generalized brain atrophy. The brainstem and cerebellum are normal. The cerebral hemispheres show a low density in the deep white matter consistent with ordinary small vessel disease. No sign of acute infarction, mass lesion, hemorrhage, hydrocephalus or extra-axial collection. The calvarium is unremarkable. Sinuses, middle ears and mastoids are clear.  IMPRESSION: No acute finding by CT. 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   Electronically Signed   By: Paulina Fusi M.D.   On: 04/12/2015 17:26    Labs: BMET  Recent Labs Lab 04/08/15 0612 04/12/15 1614 04/12/15 2208 04/13/15 1142  NA 144 142  --  142  K 4.3 4.9  --  4.6  CL  --  114*  --  114*  CO2  --  20*  --  22  GLUCOSE 82 104*  --  101*  BUN  --  52*  --  50*  CREATININE  --  8.14*  --  7.90*  CALCIUM  --  8.9  --  8.9  PHOS  --   --  4.5 4.2   CBC  Recent Labs Lab 04/08/15 0612 04/12/15 1614 04/13/15 1142  WBC  --  5.2 4.6  NEUTROABS  --  3.0  --   HGB 13.3 10.5* 10.4*  HCT 39.0 32.0* 31.7*  MCV  --  96.7 97.5  PLT  --  180 187    Medications:    . amLODipine  10 mg Oral Daily  . heparin  5,000 Units Subcutaneous Q12H  . metoprolol succinate  25 mg Oral Daily  . multivitamin with minerals  1 tablet Oral Daily  . tamsulosin  0.4 mg Oral BID    Zetta BillsJay Zaide Kardell, MD 04/14/2015, 10:22 AM

## 2015-04-14 NOTE — Progress Notes (Signed)
[facility] PROGRESS NOTE  Shane [name] XBJ:[id number] DOB: 1941/10/01 DOA: 04/12/2015 PCP: Ailene RavelHAMRICK,MAURA L, MD  Assessment/Plan: 1 acute on chronic kidney disease stage IV versus progressive kidney disease Patient denies any shortness of breath. Patient had his baseline mentation per daughter. Labs pending.  Hold on IV fluids per renal.  Labs pending for this afternoon.   2 acute encephalopathy Questionable etiology. Concern for uremia. Patient currently at his baseline. CT head negative. Follow. MS improving per family.  PT, OT consult.   3 hypertension Continue toprol XL, norvasc, flomax.  4 anemia Hemoglobin was 10.5 yesterday. Labs pending.   5 left upper extremity AV fistula placed 04/08/2015.   6 dehydration Received IV fluids.   7 COPD Stable.  8 elevated troponin Patient with no EKG changes. Patient asymptomatic. Will order ECHO.   9 prophylaxis Heparin for DVT prophylaxis.     Code Status: Full Family Communication: Updated patient and daughter at bedside. Disposition Plan: Home if remains stable and no worsening renal function tomorrow 04/15/2015   Consultants:  Nephrology: Dr. Allena KatzPatel 04/13/2015  Procedures:  CT head without contrast 04/12/2015  Antibiotics:  None  HPI/Subjective: Patient alert, oriented to place, wants to go home. Multiple family member at bedside.  Per family patient MS has improved.   Objective: Filed Vitals:   04/14/15 1042  BP: 137/77  Pulse: 83  Temp: 97.9 F (36.6 C)  Resp: 20    Intake/Output Summary (Last 24 hours) at 04/14/15 1527 Last data filed at 04/14/15 1317  Gross per 24 hour  Intake   1770 ml  Output      0 ml  Net   1770 ml   Filed Weights   04/12/15 1559 04/12/15 2048  Weight: 69.429 kg (153 lb 1 oz) 69.7 kg (153 lb 10.6 oz)    Exam:   General:  NAD  Cardiovascular: RRR  Respiratory: CTAB  Abdomen: Soft/NT/ND/+BS  Musculoskeletal: No c/c/e  Data Reviewed: Basic Metabolic  Panel:  Recent Labs Lab 04/08/15 0612 04/12/15 1614 04/12/15 2208 04/13/15 1142  NA 144 142  --  142  K 4.3 4.9  --  4.6  CL  --  114*  --  114*  CO2  --  20*  --  22  GLUCOSE 82 104*  --  101*  BUN  --  52*  --  50*  CREATININE  --  8.14*  --  7.90*  CALCIUM  --  8.9  --  8.9  PHOS  --   --  4.5 4.2   Liver Function Tests:  Recent Labs Lab 04/12/15 1614  AST 14*  ALT 6*  ALKPHOS 48  BILITOT 0.3  PROT 6.7  ALBUMIN 3.3*   No results for input(s): LIPASE, AMYLASE in the last 168 hours. No results for input(s): AMMONIA in the last 168 hours. CBC:  Recent Labs Lab 04/08/15 0612 04/12/15 1614 04/13/15 1142  WBC  --  5.2 4.6  NEUTROABS  --  3.0  --   HGB 13.3 10.5* 10.4*  HCT 39.0 32.0* 31.7*  MCV  --  96.7 97.5  PLT  --  180 187   Cardiac Enzymes:  Recent Labs Lab 04/13/15 1142  TROPONINI 0.04*   BNP (last 3 results) No results for input(s): BNP in the last 8760 hours.  ProBNP (last 3 results) No results for input(s): PROBNP in the last 8760 hours.  CBG:  Recent Labs Lab 04/12/15 1700 04/13/15 0743  GLUCAP 75 83    Recent  Results (from the past 240 hour(s))  Blood Cultures (routine x 2)     Status: None (Preliminary result)   Collection Time: 04/12/15  4:30 PM  Result Value Ref Range Status   Specimen Description BLOOD RIGHT ARM  Final   Special Requests BOTTLES DRAWN AEROBIC AND ANAEROBIC 2CC  Final   Culture NO GROWTH 2 DAYS  Final   Report Status PENDING  Incomplete  Blood Cultures (routine x 2)     Status: None (Preliminary result)   Collection Time: 04/12/15  5:05 PM  Result Value Ref Range Status   Specimen Description BLOOD RIGHT FOREARM  Final   Special Requests BOTTLES DRAWN AEROBIC AND ANAEROBIC  Final   Culture NO GROWTH 2 DAYS  Final   Report Status PENDING  Incomplete     Studies: Ct Head Wo Contrast  04/12/2015   CLINICAL DATA:  Altered mental status.  Delirium  EXAM: CT HEAD WITHOUT CONTRAST  TECHNIQUE: Contiguous  axial images were obtained from the base of the skull through the vertex without intravenous contrast.  COMPARISON:  None.  FINDINGS: There is mild generalized brain atrophy. The brainstem and cerebellum are normal. The cerebral hemispheres show a low density in the deep white matter consistent with ordinary small vessel disease. No sign of acute infarction, mass lesion, hemorrhage, hydrocephalus or extra-axial collection. The calvarium is unremarkable. Sinuses, middle ears and mastoids are clear.  IMPRESSION: No acute finding by CT. 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   Electronically Signed   By: Paulina Fusi M.D.   On: 04/12/2015 17:26    Scheduled Meds: . amLODipine  10 mg Oral Daily  . heparin  5,000 Units Subcutaneous Q12H  . metoprolol succinate  25 mg Oral Daily  . multivitamin with minerals  1 tablet Oral Daily  . tamsulosin  0.4 mg Oral BID   Continuous Infusions:    Principal Problem:   Acute on chronic renal failure Stage IV Active Problems:   Acute encephalopathy   Acute renal failure   CKD (chronic kidney disease), stage IV   Anorexia   Elevated troponin   Amyloidosis   Uremia   Essential hypertension    Time spent: 35 mins    Hartley Barefoot A MD [facility] Pager [phone]. If 7PM-7AM, please contact night-coverage at [email or web address], password [facility] 04/14/2015, 3:27 PM  LOS: 1 day

## 2015-04-15 LAB — RENAL FUNCTION PANEL
ANION GAP: 7 (ref 5–15)
Albumin: 3 g/dL — ABNORMAL LOW (ref 3.5–5.0)
BUN: 50 mg/dL — ABNORMAL HIGH (ref 6–20)
CO2: 21 mmol/L — AB (ref 22–32)
Calcium: 9 mg/dL (ref 8.9–10.3)
Chloride: 115 mmol/L — ABNORMAL HIGH (ref 101–111)
Creatinine, Ser: 8.03 mg/dL — ABNORMAL HIGH (ref 0.61–1.24)
GFR, EST AFRICAN AMERICAN: 7 mL/min — AB (ref >60)
GFR, EST NON AFRICAN AMERICAN: 6 mL/min — AB (ref >60)
Glucose, Bld: 96 mg/dL (ref 65–99)
PHOSPHORUS: 4 mg/dL (ref 2.5–4.6)
POTASSIUM: 4.4 mmol/L (ref 3.5–5.1)
SODIUM: 143 mmol/L (ref 135–145)

## 2015-04-15 MED ORDER — CAMPHOR-MENTHOL 0.5-0.5 % EX LOTN: TOPICAL_LOTION | CUTANEOUS | Status: DC | PRN

## 2015-04-15 MED ORDER — ADULT MULTIVITAMIN W/MINERALS CH: 1.0000 | ORAL_TABLET | Freq: Every day | ORAL | Status: DC

## 2015-04-15 MED ORDER — DIPHENHYDRAMINE HCL 25 MG PO CAPS: 25.0000 mg | ORAL_CAPSULE | Freq: Four times a day (QID) | ORAL | Status: DC | PRN

## 2015-04-15 MED ORDER — FUROSEMIDE 40 MG PO TABS: 40.0000 mg | ORAL_TABLET | Freq: Every day | ORAL | Status: DC

## 2015-04-15 NOTE — Care Management Note (Signed)
Case Management Note  Patient Details  Name: Shane [name] MRN: [id number] Date of Birth: 22-Oct-1940  Subjective/Objective:        CM following for progression and d/c planning.            Action/Plan: Spoke with pt daughter, as pt is often unable to formulate a plan, and [facility]. [facility] notified.   Expected Discharge Date:       04/15/2015           Expected Discharge Plan:  Home w Home Health Services  In-House Referral:  NA  Discharge planning Services  CM Consult  Post Acute Care Choice:  NA Choice offered to:  NA  DME Arranged:    DME Agency:  NA  HH Arranged:  PT, OT HH Agency:  [facility]  Status of Service:  Completed, signed off  Medicare Important Message Given:  Yes-second notification given Date Medicare IM Given:    Medicare IM give by:    Date Additional Medicare IM Given:    Additional Medicare Important Message give by:     If discussed at Long Length of Stay Meetings, dates discussed:    Additional Comments:  Starlyn SkeansRoyal, Neiva Maenza U, RN 04/15/2015, 4:19 PM

## 2015-04-15 NOTE — Care Management (Signed)
Important Message  Patient Details  Name: Shane [name] MRN: [id number] Date of Birth: [birth year]   Medicare Important Message Given:  Yes-second notification given    Orson AloeMegan P Dellar Traber 04/15/2015, 2:41 PM

## 2015-04-15 NOTE — Progress Notes (Signed)
Patient ID: Shane [name], male   DOB: 1941/06/01, 74 y.o.   MRN: [id number]  [facility] Progress Note    Assessment/ Plan:   1. Acute renal failure on chronic kidney disease stage IV versus progression- appears to have been hemodynamically mediated postoperatively with decreased oral intake/altered mentation. Await labs from today and restart furosemide prior to discharge. 2. Altered mentation: Suspected to be from worsening renal function/sedation and analgesia postoperatively-now back to baseline per family members  3. Anemia: Lower hemoglobin compared to last week-possibly surgical losses. iron studies pending  4. Hypertension: blood pressures appear to be acceptable-discontinue intravenous fluids, continue amlodipine while holding furosemide for now  5. Metabolic bone disease: phosphorus level within acceptable limits-not on binders   Subjective:    Reports to be doing well, denies any chest pain or shortness of breath. Sitter at bedside-some agitation noted overnight.    Objective:   BP 124/75 mmHg  Pulse 65  Temp(Src) 98.9 F (37.2 C) (Oral)  Resp 17  Ht 5\' 11"  (1.803 m)  Wt 69.7 kg (153 lb 10.6 oz)  BMI 21.44 kg/m2  SpO2 99%  Intake/Output Summary (Last 24 hours) at 04/15/15 1036 Last data filed at 04/15/15 0620  Gross per 24 hour  Intake    600 ml  Output      0 ml  Net    600 ml   Weight change:   Physical Exam: Gen: Comfortably resting in bed, poorly oriented to time but oriented well to place/person CVS: Pulse regular in rate and rhythm, S1 and S2 normal  Resp: Clear to auscultation, no rales  Abd: Soft, flat, nontender  Ext: No lower extremity edema. Palpable thrill/audible bruit over left basilic vein transposition fistula   Imaging: No results found.  Labs: BMET  Recent Labs Lab 04/12/15 1614 04/12/15 2208 04/13/15 1142 04/14/15 1550  NA 142  --  142 142  K 4.9  --  4.6 4.3  CL 114*  --  114* 116*  CO2 20*  --  22 19*  GLUCOSE  104*  --  101* 94  BUN 52*  --  50* 47*  CREATININE 8.14*  --  7.90* 7.92*  CALCIUM 8.9  --  8.9 8.7*  PHOS  --  4.5 4.2 3.8   CBC  Recent Labs Lab 04/12/15 1614 04/13/15 1142 04/14/15 1530  WBC 5.2 4.6 5.2  NEUTROABS 3.0  --   --   HGB 10.5* 10.4* 10.4*  HCT 32.0* 31.7* 32.0*  MCV 96.7 97.5 97.6  PLT 180 187 181   Medications:    . amLODipine  10 mg Oral Daily  . heparin  5,000 Units Subcutaneous Q12H  . metoprolol succinate  25 mg Oral Daily  . multivitamin with minerals  1 tablet Oral Daily  . tamsulosin  0.4 mg Oral BID   Zetta BillsJay Derran Sear, MD 04/15/2015, 10:36 AM

## 2015-04-15 NOTE — Evaluation (Signed)
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 Labs show creat 8 (last was 6.0 two yrs ago here). Trop 0.27, EKG normal, no CP or SOB. Poor appetite x 1 week, no n/v or fatigue per patient. Patient was initially appearing confused with orientation questions by the PA but then later was fully oriented with speaking with the RN   Clinical Impression   Pt is moving well and able to perform ADL and ADL transfers.  Per MD note, family reports pt's cognition is at his baseline with daughter revealing to PT that she has observed deficits which he attempts to cover with humor.  Pt admits to an impaired memory, but denies that it interferes with his ability to manage MD appointments, his medication or to drive. Administered the [facility] with pt scoring 11/30, 26 or above being WNL. Pt noted to close his eyes near the end of the test and stated he was tired.  Recommending HHOT to address cognition related to executive functioning/IADL and further formal assessment by neuropsychologist. Will follow for family education acutely.    Follow Up Recommendations  Home health OT    Equipment Recommendations       Recommendations for Other Services       Precautions / Restrictions Precautions Precautions: Fall Precaution Comments: reports limited ambulation tolerance due to plantar warts and back pain Restrictions Weight Bearing Restrictions: No      Mobility Bed Mobility Overal bed mobility: Modified Independent                Transfers Overall transfer level: Modified independent                    Balance Overall balance assessment: No apparent balance deficits (not formally assessed)                                          ADL  Overall ADL's : Modified independent Eating/Feeding: Independent;Sitting   Grooming: Wash/dry hands;Standing;Modified independent Grooming Details (indicate cue type and reason): cues for use of automatic soap dispenser                             Functional mobility during ADLs: Modified independent (slower pace due to foot pain) General ADL Comments: modified independent     Vision Additional Comments: pt able to read small print and see clock   Perception     Praxis      Pertinent Vitals/Pain Pain Assessment: Faces Faces Pain Scale: Hurts little more Pain Location: feet Pain Intervention(s): Limited activity within patient's tolerance;Monitored during session;Repositioned     Hand Dominance Right   Extremity/Trunk Assessment Upper Extremity Assessment Upper Extremity Assessment: Overall WFL for tasks assessed LUE Deficits / Details: new AV fistula, some edema present   Lower Extremity Assessment Lower Extremity Assessment: Defer to PT evaluation RLE Deficits / Details: AROM WFL, strength grossly 4/5, noted plantar wart on left foot near first met head, pt reports present on both feet LLE Deficits / Details:  strength grossly 4/5, noted plantar wart on left foot near first met head, pt reports present on both feet  Communication Communication Communication: No difficulties   Cognition Arousal/Alertness: Awake/alert Behavior During Therapy: WFL for tasks assessed/performed Overall Cognitive Status: Impaired/Different from baseline Area of Impairment: Orientation;Attention;Memory;Problem solving Orientation Level: Disoriented to;Time;Place;Situation Current Attention Level: Sustained Memory: Decreased short-term memory   Safety/Judgement: Decreased awareness of deficits   Problem Solving: Slow processing;Difficulty sequencing;Requires verbal cues General Comments: administered MOCA with score of 11/30   General Comments       Exercises        Shoulder Instructions      Home Living Family/patient expects to be discharged to:: Private residence Living Arrangements: Spouse/significant other;Children Available Help at Discharge: Family;Available PRN/intermittently Type of Home: House Home Access: Ramped entrance     Home Layout: One level     Bathroom Shower/Tub: Teacher, early years/pre: Handicapped height     Home Equipment: Environmental consultant - 4 wheels   Additional Comments: 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, daughter with down syndrome      Prior Functioning/Environment Level of Independence: Independent        Comments: reports cooks, pays bills, manages medications    OT Diagnosis: Generalized weakness;Cognitive deficits   OT Problem List: Impaired balance (sitting and/or standing);Decreased cognition;Decreased safety awareness;Pain   OT Treatment/Interventions: Self-care/ADL training;Cognitive remediation/compensation;Patient/family education    OT Goals(Current goals can be found in the care plan section) Acute Rehab OT Goals Patient Stated Goal: For pt to go home ADL Goals Additional ADL Goal #1: Pt/family will be aware of pt's cognitive deficits, compensatory strategies, and implications for safety and executive functioning at home.  OT Frequency: Min 2X/week   Barriers to D/C:            Co-evaluation              End of Session    Activity Tolerance: Patient tolerated treatment well Patient left: in bed;with call bell/phone within reach;with nursing/sitter in room   Time: 1032-1100 OT Time Calculation (min): 28 min Charges:  OT General Charges $OT Visit: 1 Procedure OT Evaluation $Initial OT Evaluation Tier I: 1 Procedure OT Treatments $Cognitive Skills Development: 8-22 mins G-Codes:    Malka So 04/15/2015, 11:16 AM [phone]

## 2015-04-15 NOTE — Discharge Summary (Signed)
Physician Discharge Summary  Shane [name]:[id number] DOB: [birth year] DOA: 04/12/2015  PCP: Shane Ravel, MD  Admit date: 04/12/2015 Discharge date: 04/15/2015  Time spent: 35 minutes  Recommendations for Outpatient Follow-up:  1. Needs Bmet to follow renal function.  2. Needs to follow up with nephrologist   Discharge Diagnoses:    Acute on chronic renal failure Stage IV   Acute encephalopathy   Acute renal failure   CKD (chronic kidney disease), stage IV   Anorexia   Elevated troponin   Amyloidosis   Uremia   Essential hypertension   Discharge Condition: Stable.   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 Labs show creat 8 (last was 6.0 two yrs ago here). Trop 0.27, EKG normal, no CP or SOB. Poor appetite x 1 week, no n/v or fatigue per patient. Patient was initially appearing confused with orientation questions by the PA but then later was fully oriented with speaking with the RN.   Hospital Course:  1 acute on chronic kidney disease stage IV versus progressive kidney disease Patient denies any shortness of breath. Patient had his baseline mentation per daughter. Labs pending.  Hold on IV fluids per renal.  Cr at 8. Discussed with Dr Allena Katz, this probably patient new baseline. Plan to discharge today on lasix. And close follow up with nephrology  2 acute encephalopathy Questionable etiology. Concern for uremia. Patient currently at his baseline. CT head negative. Follow. MS improving per family.  PT, OT consult.   3 hypertension Continue toprol XL, norvasc, flomax.  4 anemia Hemoglobin was 10.5  5 left upper extremity AV fistula placed 04/08/2015.   6 dehydration Received IV fluids.   7  COPD Stable.  8 elevated troponin Patient with no EKG changes. Patient asymptomatic. ECHO with normal EF no wall motion abnormalities  9 prophylaxis Heparin for DVT prophylaxis.   Procedures:  none  Consultations:  Dr Allena Katz  Discharge Exam: Filed Vitals:   04/14/15 2055  BP: 124/75  Pulse: 65  Resp: 17    General: Alert in no distress.  Cardiovascular: S 1, S 2 RRR Respiratory: CTA  Discharge Instructions   Discharge Instructions    Diet - low sodium heart healthy    Complete by:  As directed      Increase activity slowly    Complete by:  As directed           Current Discharge Medication List    START taking these medications   Details  camphor-menthol (SARNA) lotion Apply topically as needed for itching. Qty: 222 mL, Refills: 0    diphenhydrAMINE (BENADRYL) 25 mg capsule Take 1 capsule (25 mg total) by mouth every 6 (six) hours as needed for itching. Qty: 30 capsule, Refills: 0    Multiple Vitamin (MULTIVITAMIN WITH MINERALS) TABS tablet Take 1 tablet by mouth daily. Qty: 30 tablet, Refills: 0      CONTINUE these medications which have CHANGED   Details  furosemide (LASIX) 40 MG tablet Take 1 tablet (40 mg total) by mouth daily. Qty: 30 tablet, Refills: 0      CONTINUE these medications which have NOT CHANGED   Details  albuterol-ipratropium (COMBIVENT) 18-103 MCG/ACT inhaler Inhale  2 puffs into the lungs every 6 (six) hours as needed for wheezing. Qty: 1 Inhaler, Refills: 6    amLODipine (NORVASC) 10 MG tablet Take 1 tablet (10 mg total) by mouth daily. Qty: 30 tablet, Refills: 11    metoprolol succinate (TOPROL-XL) 50 MG 24 hr tablet Take 25 mg by mouth daily. Take with or immediately following [name] meal.    Tamsulosin HCl (FLOMAX) 0.4 MG CAPS Take 0.4 mg by mouth 2 (two) times daily.       STOP taking these medications     oxyCODONE (ROXICODONE) 5 MG immediate release tablet        No Known Allergies Follow-up Information    Follow  up with ShaneJOSEPH A, MD In 1 week.   Specialty:  Nephrology   Contact information:   [address] [place] [id number] [phone]        The results of significant diagnostics from this hospitalization (including imaging, microbiology, ancillary and laboratory) are listed below for reference.    Significant Diagnostic Studies: Ct Head Wo Contrast  04/12/2015   CLINICAL DATA:  Altered mental status.  Delirium  EXAM: CT HEAD WITHOUT CONTRAST  TECHNIQUE: Contiguous axial images were obtained from the base of the skull through the vertex without intravenous contrast.  COMPARISON:  None.  FINDINGS: There is mild generalized brain atrophy. The brainstem and cerebellum are normal. The cerebral hemispheres show [name] low density in the deep white matter consistent with ordinary small vessel disease. No sign of acute infarction, mass lesion, hemorrhage, hydrocephalus or extra-axial collection. The calvarium is unremarkable. Sinuses, middle ears and mastoids are clear.  IMPRESSION: No acute finding by CT. 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   Electronically Signed   By: [name]  Shogry M.D.   On: 04/12/2015 17:26    Microbiology: Recent Results (from the past 240 hour(s))  Blood Cultures (routine x 2)     Status: None (Preliminary result)   Collection Time: 04/12/15  4:30 PM  Result Value Ref Range Status   Specimen Description BLOOD RIGHT ARM  Final   Special Requests BOTTLES DRAWN AEROBIC AND ANAEROBIC 2CC  Final   Culture NO GROWTH 3 DAYS  Final   Report Status PENDING  Incomplete  Blood Cultures (routine x 2)     Status: None (Preliminary result)   Collection Time: 04/12/15  5:05 PM  Result Value Ref Range Status   Specimen Description BLOOD RIGHT FOREARM  Final   Special Requests BOTTLES DRAWN AEROBIC AND ANAEROBIC 3ML  Final   Culture NO GROWTH 3 DAYS  Final   Report Status PENDING  Incomplete     Labs: Basic Metabolic Panel:  Recent  Labs Lab 04/12/15 1614 04/12/15 2208 04/13/15 1142 04/14/15 1550 04/15/15 1014  NA 142  --  142 142 143  K 4.9  --  4.6 4.3 4.4  CL 114*  --  114* 116* 115*  CO2 20*  --  22 19* 21*  GLUCOSE 104*  --  101* 94 96  BUN 52*  --  50* 47* 50*  CREATININE 8.14*  --  7.90* 7.92* 8.03*  CALCIUM 8.9  --  8.9 8.7* 9.0  PHOS  --  4.5 4.2 3.8 4.0   Liver Function Tests:  Recent Labs Lab 04/12/15 1614 04/14/15 1550 04/15/15 1014  AST 14*  --   --   ALT 6*  --   --   ALKPHOS 48  --   --   BILITOT  0.3  --   --   PROT 6.7  --   --   ALBUMIN 3.3* 3.1* 3.0*   No results for input(s): LIPASE, AMYLASE in the last 168 hours. No results for input(s): AMMONIA in the last 168 hours. CBC:  Recent Labs Lab 04/12/15 1614 04/13/15 1142 04/14/15 1530  WBC 5.2 4.6 5.2  NEUTROABS 3.0  --   --   HGB 10.5* 10.4* 10.4*  HCT 32.0* 31.7* 32.0*  MCV 96.7 97.5 97.6  PLT 180 187 181   Cardiac Enzymes:  Recent Labs Lab 04/13/15 1142  TROPONINI 0.04*   BNP: BNP (last 3 results) No results for input(s): BNP in the last 8760 hours.  ProBNP (last 3 results) No results for input(s): PROBNP in the last 8760 hours.  CBG:  Recent Labs Lab 04/12/15 1700 04/13/15 0743  GLUCAP 75 83       Signed:  Lynelle Weiler [name]  [facility] 04/15/2015, 1:55 PM

## 2015-04-15 NOTE — Evaluation (Signed)
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 Labs show creat 8 (last was 6.0 two yrs ago here). Trop 0.27, EKG normal, no CP or SOB. Poor appetite x 1 week, no n/v or fatigue per patient. Patient was initially appearing confused with orientation questions by the PA but then later was fully oriented with speaking with the RN  Clinical Impression  Patient presents with decreased safety and independence with mobility due to deficits listed in PT problem list.  Patient with mild weakness/imbalance, but mainly limited from safety perspective due to cognitive status.  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  She reports patient having some deficits prior to this episode and covered a lot due to joking demeanor.  She lives close and checks frequently throughout the day and stays for hours if needed.  She does not want placement for patient.  Feel patient's mobility is safe for this level of supervision.  Daughter requesting cognitive evaluation to ensure safe for patient, wife and daughter to stay unsupervised at night.  PT to follow acutely    Follow Up Recommendations Home health PT;Supervision - Intermittent    Equipment Recommendations  None recommended by PT    Recommendations for Other Services       Precautions / Restrictions Precautions Precautions: Fall Precaution Comments: reports limited ambulation tolerance due to plantar warts and back pain      Mobility  Bed Mobility Overal bed mobility: Modified Independent                Transfers Overall transfer level: Modified independent                  Ambulation/Gait Ambulation/Gait assistance:  Independent Ambulation Distance (Feet): 60 Feet   Gait Pattern/deviations: Step-through pattern;WFL(Within Functional Limits)     General Gait Details: stayed in room couple of trips to door and back, declined hallway ambulation due to painful feet  Stairs            Wheelchair Mobility    Modified Rankin (Stroke Patients Only)       Balance Overall balance assessment: No apparent balance deficits (not formally assessed)                                           Pertinent Vitals/Pain Pain Assessment: Faces Faces Pain Scale: Hurts little more Pain Location: feet due to plantar warts, chronic back pain Pain Intervention(s): Monitored during session;Limited activity within patient's tolerance    Home Living Family/patient expects to be discharged to:: Private residence Living Arrangements: Spouse/significant other;Children Available Help at Discharge: Family;Available PRN/intermittently Type of Home: House Home Access: Ramped entrance     Home Layout: One level Home Equipment: Walker - 4 wheels Additional Comments: 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, daughter with downs syndrome    Prior Function Level of Independence: Independent         Comments: reports cooks, pays bills, etc     Hand Dominance   Dominant Hand: Right    Extremity/Trunk Assessment   Upper Extremity Assessment:  LUE deficits/detail       LUE Deficits / Details: new AV fistula, some edema present   Lower Extremity Assessment: RLE deficits/detail;LLE deficits/detail RLE Deficits / Details: AROM WFL, strength grossly 4/5, noted plantar wart on left foot near first met head, pt reports present on both feet LLE Deficits / Details:  strength grossly 4/5, noted plantar wart on left foot near first met head, pt reports present on both feet     Communication   Communication: No difficulties  Cognition Arousal/Alertness: Awake/alert Behavior  During Therapy: WFL for tasks assessed/performed Overall Cognitive Status: Impaired/Different from baseline Area of Impairment: Orientation;Memory;Safety/judgement Orientation Level: Time;Situation   Memory: Decreased short-term memory   Safety/Judgement: Decreased awareness of safety     General Comments: requesting to go outside to smoke, states was '99, Oct, making levity of situation and explaining, "i'm not crazy, just been out of commission few days"    General Comments      Exercises        Assessment/Plan    PT Assessment Patient needs continued PT services  PT Diagnosis Generalized weakness;Altered mental status   PT Problem List Decreased strength;Decreased activity tolerance;Decreased mobility;Decreased cognition;Decreased safety awareness  PT Treatment Interventions DME instruction;Gait training;Therapeutic exercise;Cognitive remediation;Balance training;Functional mobility training;Therapeutic activities;Patient/family education   PT Goals (Current goals can be found in the Care Plan section) Acute Rehab PT Goals Patient Stated Goal: For pt to go home PT Goal Formulation: With patient/family Time For Goal Achievement: 04/22/15 Potential to Achieve Goals: Good    Frequency Min 3X/week   Barriers to discharge        Co-evaluation               End of Session Equipment Utilized During Treatment: Gait belt Activity Tolerance: Patient tolerated treatment well Patient left: in bed;with nursing/sitter in room           Time: 0930-0955 PT Time Calculation (min) (ACUTE ONLY): 25 min   Charges:   PT Evaluation $Initial PT Evaluation Tier I: 1 Procedure PT Treatments $Gait Training: 8-22 mins   PT G Codes:        [place] Wixom,CYNDI 04-22-15, 10:09 AM  Magda Kiel, [place] Apr 22, 2015

## 2015-04-16 DIAGNOSIS — Z85528 Personal history of other malignant neoplasm of kidney: Secondary | ICD-10-CM | POA: Diagnosis not present

## 2015-04-16 DIAGNOSIS — M6281 Muscle weakness (generalized): Secondary | ICD-10-CM | POA: Diagnosis not present

## 2015-04-16 DIAGNOSIS — N2581 Secondary hyperparathyroidism of renal origin: Secondary | ICD-10-CM | POA: Diagnosis not present

## 2015-04-16 DIAGNOSIS — D649 Anemia, unspecified: Secondary | ICD-10-CM | POA: Diagnosis not present

## 2015-04-16 DIAGNOSIS — R809 Proteinuria, unspecified: Secondary | ICD-10-CM | POA: Diagnosis not present

## 2015-04-16 DIAGNOSIS — Z720 Tobacco use: Secondary | ICD-10-CM | POA: Diagnosis not present

## 2015-04-16 DIAGNOSIS — I129 Hypertensive chronic kidney disease with stage 1 through stage 4 chronic kidney disease, or unspecified chronic kidney disease: Secondary | ICD-10-CM | POA: Diagnosis not present

## 2015-04-16 DIAGNOSIS — J449 Chronic obstructive pulmonary disease, unspecified: Secondary | ICD-10-CM | POA: Diagnosis not present

## 2015-04-16 DIAGNOSIS — N184 Chronic kidney disease, stage 4 (severe): Secondary | ICD-10-CM | POA: Diagnosis not present

## 2015-04-16 DIAGNOSIS — I120 Hypertensive chronic kidney disease with stage 5 chronic kidney disease or end stage renal disease: Secondary | ICD-10-CM | POA: Diagnosis not present

## 2015-04-16 DIAGNOSIS — N185 Chronic kidney disease, stage 5: Secondary | ICD-10-CM | POA: Diagnosis not present

## 2015-04-16 DIAGNOSIS — R630 Anorexia: Secondary | ICD-10-CM | POA: Diagnosis not present

## 2015-04-17 LAB — CULTURE, BLOOD (ROUTINE X 2)
Culture: NO GROWTH
Culture: NO GROWTH

## 2015-04-27 DIAGNOSIS — K5900 Constipation, unspecified: Secondary | ICD-10-CM | POA: Diagnosis not present

## 2015-04-27 DIAGNOSIS — N185 Chronic kidney disease, stage 5: Secondary | ICD-10-CM | POA: Diagnosis not present

## 2015-04-27 DIAGNOSIS — D649 Anemia, unspecified: Secondary | ICD-10-CM | POA: Diagnosis not present

## 2015-04-27 DIAGNOSIS — Z139 Encounter for screening, unspecified: Secondary | ICD-10-CM | POA: Diagnosis not present

## 2015-04-27 DIAGNOSIS — Z9181 History of falling: Secondary | ICD-10-CM | POA: Diagnosis not present

## 2015-04-27 DIAGNOSIS — Z1389 Encounter for screening for other disorder: Secondary | ICD-10-CM | POA: Diagnosis not present

## 2015-05-18 ENCOUNTER — Ambulatory Visit (INDEPENDENT_AMBULATORY_CARE_PROVIDER_SITE_OTHER): Payer: Self-pay | Admitting: Vascular Surgery

## 2015-05-18 ENCOUNTER — Encounter: Payer: Self-pay | Admitting: Vascular Surgery

## 2015-05-18 VITALS — BP 136/83 | HR 88 | Temp 98.2°F | Resp 16 | Ht 70.5 in | Wt 164.0 lb

## 2015-05-18 DIAGNOSIS — N185 Chronic kidney disease, stage 5: Secondary | ICD-10-CM

## 2015-05-18 NOTE — Progress Notes (Signed)
Subjective:     Patient ID: Shane [name], male   DOB: 12-05-1940, 74 y.o.   MRN: [id number]  HPI this 74 year old male returns for follow-up regarding a basilic vein transposition in the left upper extremity performed 04/08/2015. Patient is not on hemodialysis at this time. He denies any pain or numbness in the left hand. He's had no chills and fever.   Review of Systems     Objective:   Physical Exam BP 136/83 mmHg  Pulse 88  Temp(Src) 98.2 F (36.8 C)  Resp 16  Ht 5' 10.5" (1.791 m)  Wt 164 lb (74.39 kg)  BMI 23.19 kg/m2  Gen. well-developed well-nourished male in no apparent distress alert and oriented 3 Left upper extremity with nicely healed incisions and upper arm and proximal forearm. Excellent pulse and palpable thrill and easily visible upper arm basilic vein transposition fistula. 2+ radial pulse distally with well-perfused left hand.     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

## 2015-05-19 DIAGNOSIS — N185 Chronic kidney disease, stage 5: Secondary | ICD-10-CM | POA: Diagnosis not present

## 2015-05-19 DIAGNOSIS — R609 Edema, unspecified: Secondary | ICD-10-CM | POA: Diagnosis not present

## 2015-05-19 DIAGNOSIS — I10 Essential (primary) hypertension: Secondary | ICD-10-CM | POA: Diagnosis not present

## 2015-05-19 DIAGNOSIS — E854 Organ-limited amyloidosis: Secondary | ICD-10-CM | POA: Diagnosis not present

## 2015-05-19 DIAGNOSIS — N400 Enlarged prostate without lower urinary tract symptoms: Secondary | ICD-10-CM | POA: Diagnosis not present

## 2015-05-27 DIAGNOSIS — I120 Hypertensive chronic kidney disease with stage 5 chronic kidney disease or end stage renal disease: Secondary | ICD-10-CM | POA: Diagnosis not present

## 2015-05-27 DIAGNOSIS — D638 Anemia in other chronic diseases classified elsewhere: Secondary | ICD-10-CM | POA: Diagnosis not present

## 2015-05-27 DIAGNOSIS — N185 Chronic kidney disease, stage 5: Secondary | ICD-10-CM | POA: Diagnosis not present

## 2015-05-27 DIAGNOSIS — Z9889 Other specified postprocedural states: Secondary | ICD-10-CM | POA: Diagnosis not present

## 2015-06-15 DIAGNOSIS — N185 Chronic kidney disease, stage 5: Secondary | ICD-10-CM | POA: Diagnosis not present

## 2015-06-15 DIAGNOSIS — R609 Edema, unspecified: Secondary | ICD-10-CM | POA: Diagnosis not present

## 2015-06-15 DIAGNOSIS — Z6824 Body mass index (BMI) 24.0-24.9, adult: Secondary | ICD-10-CM | POA: Diagnosis not present

## 2015-06-22 DIAGNOSIS — N185 Chronic kidney disease, stage 5: Secondary | ICD-10-CM | POA: Diagnosis not present

## 2015-06-22 DIAGNOSIS — D638 Anemia in other chronic diseases classified elsewhere: Secondary | ICD-10-CM | POA: Diagnosis not present

## 2015-06-24 DIAGNOSIS — D638 Anemia in other chronic diseases classified elsewhere: Secondary | ICD-10-CM | POA: Diagnosis not present

## 2015-06-24 DIAGNOSIS — N2581 Secondary hyperparathyroidism of renal origin: Secondary | ICD-10-CM | POA: Diagnosis not present

## 2015-06-24 DIAGNOSIS — N185 Chronic kidney disease, stage 5: Secondary | ICD-10-CM | POA: Diagnosis not present

## 2015-06-24 DIAGNOSIS — I120 Hypertensive chronic kidney disease with stage 5 chronic kidney disease or end stage renal disease: Secondary | ICD-10-CM | POA: Diagnosis not present

## 2015-07-06 DIAGNOSIS — J441 Chronic obstructive pulmonary disease with (acute) exacerbation: Secondary | ICD-10-CM | POA: Diagnosis not present

## 2015-07-06 DIAGNOSIS — R609 Edema, unspecified: Secondary | ICD-10-CM | POA: Diagnosis not present

## 2015-07-06 DIAGNOSIS — N185 Chronic kidney disease, stage 5: Secondary | ICD-10-CM | POA: Diagnosis not present

## 2015-07-08 DIAGNOSIS — Z6823 Body mass index (BMI) 23.0-23.9, adult: Secondary | ICD-10-CM | POA: Diagnosis not present

## 2015-07-08 DIAGNOSIS — J441 Chronic obstructive pulmonary disease with (acute) exacerbation: Secondary | ICD-10-CM | POA: Diagnosis not present

## 2015-07-26 DIAGNOSIS — N189 Chronic kidney disease, unspecified: Secondary | ICD-10-CM | POA: Diagnosis not present

## 2015-07-26 DIAGNOSIS — N401 Enlarged prostate with lower urinary tract symptoms: Secondary | ICD-10-CM | POA: Diagnosis not present

## 2015-07-26 DIAGNOSIS — Z6824 Body mass index (BMI) 24.0-24.9, adult: Secondary | ICD-10-CM | POA: Diagnosis not present

## 2015-07-26 DIAGNOSIS — K4090 Unilateral inguinal hernia, without obstruction or gangrene, not specified as recurrent: Secondary | ICD-10-CM | POA: Diagnosis not present

## 2015-07-26 DIAGNOSIS — R339 Retention of urine, unspecified: Secondary | ICD-10-CM | POA: Diagnosis not present

## 2015-07-26 DIAGNOSIS — N185 Chronic kidney disease, stage 5: Secondary | ICD-10-CM | POA: Diagnosis not present

## 2015-07-26 DIAGNOSIS — N400 Enlarged prostate without lower urinary tract symptoms: Secondary | ICD-10-CM | POA: Diagnosis not present

## 2015-08-01 ENCOUNTER — Inpatient Hospital Stay (HOSPITAL_COMMUNITY)
Admission: EM | Admit: 2015-08-01 | Discharge: 2015-08-05 | DRG: 640 | Disposition: A | Discharge status: Home / Self Care | Payer: Medicare Other | Attending: Student in an Organized Health Care Education/Training Program | Admitting: Student in an Organized Health Care Education/Training Program

## 2015-08-01 ENCOUNTER — Encounter (HOSPITAL_COMMUNITY): Payer: Self-pay | Admitting: Vascular Surgery

## 2015-08-01 ENCOUNTER — Emergency Department (HOSPITAL_COMMUNITY): Payer: Medicare Other

## 2015-08-01 DIAGNOSIS — F17210 Nicotine dependence, cigarettes, uncomplicated: Secondary | ICD-10-CM | POA: Diagnosis present

## 2015-08-01 DIAGNOSIS — R05 Cough: Secondary | ICD-10-CM | POA: Diagnosis not present

## 2015-08-01 DIAGNOSIS — Z992 Dependence on renal dialysis: Secondary | ICD-10-CM | POA: Diagnosis not present

## 2015-08-01 DIAGNOSIS — I10 Essential (primary) hypertension: Secondary | ICD-10-CM | POA: Diagnosis present

## 2015-08-01 DIAGNOSIS — N184 Chronic kidney disease, stage 4 (severe): Secondary | ICD-10-CM | POA: Diagnosis not present

## 2015-08-01 DIAGNOSIS — R0602 Shortness of breath: Secondary | ICD-10-CM | POA: Diagnosis not present

## 2015-08-01 DIAGNOSIS — R630 Anorexia: Secondary | ICD-10-CM

## 2015-08-01 DIAGNOSIS — B952 Enterococcus as the cause of diseases classified elsewhere: Secondary | ICD-10-CM | POA: Diagnosis present

## 2015-08-01 DIAGNOSIS — N186 End stage renal disease: Secondary | ICD-10-CM | POA: Diagnosis not present

## 2015-08-01 DIAGNOSIS — E850 Non-neuropathic heredofamilial amyloidosis: Secondary | ICD-10-CM | POA: Diagnosis not present

## 2015-08-01 DIAGNOSIS — N179 Acute kidney failure, unspecified: Secondary | ICD-10-CM

## 2015-08-01 DIAGNOSIS — J90 Pleural effusion, not elsewhere classified: Secondary | ICD-10-CM | POA: Diagnosis not present

## 2015-08-01 DIAGNOSIS — E8779 Other fluid overload: Secondary | ICD-10-CM | POA: Diagnosis not present

## 2015-08-01 DIAGNOSIS — E8770 Fluid overload, unspecified: Secondary | ICD-10-CM | POA: Diagnosis present

## 2015-08-01 DIAGNOSIS — N390 Urinary tract infection, site not specified: Secondary | ICD-10-CM | POA: Diagnosis not present

## 2015-08-01 DIAGNOSIS — R392 Extrarenal uremia: Secondary | ICD-10-CM | POA: Diagnosis not present

## 2015-08-01 DIAGNOSIS — E859 Amyloidosis, unspecified: Secondary | ICD-10-CM | POA: Diagnosis present

## 2015-08-01 DIAGNOSIS — E851 Neuropathic heredofamilial amyloidosis: Secondary | ICD-10-CM | POA: Diagnosis present

## 2015-08-01 DIAGNOSIS — I129 Hypertensive chronic kidney disease with stage 1 through stage 4 chronic kidney disease, or unspecified chronic kidney disease: Secondary | ICD-10-CM | POA: Diagnosis not present

## 2015-08-01 DIAGNOSIS — I120 Hypertensive chronic kidney disease with stage 5 chronic kidney disease or end stage renal disease: Secondary | ICD-10-CM | POA: Diagnosis not present

## 2015-08-01 DIAGNOSIS — J449 Chronic obstructive pulmonary disease, unspecified: Secondary | ICD-10-CM | POA: Diagnosis not present

## 2015-08-01 DIAGNOSIS — N185 Chronic kidney disease, stage 5: Secondary | ICD-10-CM | POA: Diagnosis not present

## 2015-08-01 LAB — CBC WITH DIFFERENTIAL/PLATELET
BASOS PCT: 0 %
Basophils Absolute: 0 10*3/uL (ref 0.0–0.1)
Eosinophils Absolute: 0.2 10*3/uL (ref 0.0–0.7)
Eosinophils Relative: 4 %
HCT: 37.1 % — ABNORMAL LOW (ref 39.0–52.0)
Hemoglobin: 11.7 g/dL — ABNORMAL LOW (ref 13.0–17.0)
LYMPHS PCT: 9 %
Lymphs Abs: 0.4 10*3/uL — ABNORMAL LOW (ref 0.7–4.0)
MCH: 30.2 pg (ref 26.0–34.0)
MCHC: 31.5 g/dL (ref 30.0–36.0)
MCV: 95.9 fL (ref 78.0–100.0)
MONOS PCT: 14 %
Monocytes Absolute: 0.6 10*3/uL (ref 0.1–1.0)
NEUTROS ABS: 3.4 10*3/uL (ref 1.7–7.7)
NEUTROS PCT: 73 %
PLATELETS: 152 10*3/uL (ref 150–400)
RBC: 3.87 MIL/uL — ABNORMAL LOW (ref 4.22–5.81)
RDW: 14.4 % (ref 11.5–15.5)
WBC: 4.6 10*3/uL (ref 4.0–10.5)

## 2015-08-01 LAB — BASIC METABOLIC PANEL
ANION GAP: 10 (ref 5–15)
BUN: 58 mg/dL — ABNORMAL HIGH (ref 6–20)
CALCIUM: 9.1 mg/dL (ref 8.9–10.3)
CHLORIDE: 114 mmol/L — AB (ref 101–111)
CO2: 19 mmol/L — AB (ref 22–32)
Creatinine, Ser: 6.78 mg/dL — ABNORMAL HIGH (ref 0.61–1.24)
GFR calc non Af Amer: 7 mL/min — ABNORMAL LOW (ref >60)
GFR, EST AFRICAN AMERICAN: 8 mL/min — AB (ref >60)
GLUCOSE: 93 mg/dL (ref 65–99)
POTASSIUM: 4.7 mmol/L (ref 3.5–5.1)
Sodium: 143 mmol/L (ref 135–145)

## 2015-08-01 LAB — URINALYSIS, ROUTINE W REFLEX MICROSCOPIC
BILIRUBIN URINE: NEGATIVE
GLUCOSE, UA: 100 mg/dL — AB
KETONES UR: NEGATIVE mg/dL
Nitrite: NEGATIVE
PH: 5 (ref 5.0–8.0)
Protein, ur: >300 mg/dL — AB
Specific Gravity, Urine: 1.02 (ref 1.005–1.030)
Urobilinogen, UA: 0.2 mg/dL (ref 0.0–1.0)

## 2015-08-01 LAB — I-STAT VENOUS BLOOD GAS, ED
Acid-base deficit: 7 mmol/L — ABNORMAL HIGH (ref 0.0–2.0)
Bicarbonate: 19.7 mEq/L — ABNORMAL LOW (ref 20.0–24.0)
O2 Saturation: 25 %
PCO2 VEN: 43.8 mmHg — AB (ref 45.0–50.0)
PO2 VEN: 20 mmHg — AB (ref 30.0–45.0)
TCO2: 21 mmol/L (ref 0–100)
pH, Ven: 7.26 (ref 7.250–7.300)

## 2015-08-01 LAB — PROCALCITONIN

## 2015-08-01 LAB — I-STAT TROPONIN, ED: TROPONIN I, POC: 0.01 ng/mL (ref 0.00–0.08)

## 2015-08-01 LAB — URINE MICROSCOPIC-ADD ON

## 2015-08-01 LAB — BRAIN NATRIURETIC PEPTIDE: B NATRIURETIC PEPTIDE 5: 2177.6 pg/mL — AB (ref 0.0–100.0)

## 2015-08-01 LAB — PHOSPHORUS: PHOSPHORUS: 5.4 mg/dL — AB (ref 2.5–4.6)

## 2015-08-01 MED ORDER — METOPROLOL SUCCINATE ER 50 MG PO TB24
50.0000 mg | ORAL_TABLET | Freq: Every day | ORAL | Status: DC
  Administered 2015-08-01 – 2015-08-05 (×4): 50 mg via ORAL
  Filled 2015-08-01 (×4): qty 1

## 2015-08-01 MED ORDER — AMLODIPINE BESYLATE 10 MG PO TABS
10.0000 mg | ORAL_TABLET | Freq: Every day | ORAL | Status: DC
  Administered 2015-08-03 – 2015-08-04 (×2): 10 mg via ORAL
  Filled 2015-08-01 (×2): qty 1

## 2015-08-01 MED ORDER — DEXTROSE 5 % IV SOLN
1.0000 g | Freq: Once | INTRAVENOUS | Status: AC
  Administered 2015-08-01: 1 g via INTRAVENOUS
  Filled 2015-08-01: qty 10

## 2015-08-01 MED ORDER — NITROGLYCERIN 0.4 MG SL SUBL
0.4000 mg | SUBLINGUAL_TABLET | SUBLINGUAL | Status: DC | PRN
  Administered 2015-08-01 (×2): 0.4 mg via SUBLINGUAL
  Filled 2015-08-01: qty 1

## 2015-08-01 MED ORDER — CALCIUM ACETATE (PHOS BINDER) 667 MG PO CAPS
2001.0000 mg | ORAL_CAPSULE | Freq: Every day | ORAL | Status: DC
  Administered 2015-08-02 – 2015-08-04 (×3): 2001 mg via ORAL
  Filled 2015-08-01 (×3): qty 3

## 2015-08-01 MED ORDER — HEPARIN SODIUM (PORCINE) 5000 UNIT/ML IJ SOLN
5000.0000 [IU] | Freq: Three times a day (TID) | INTRAMUSCULAR | Status: DC
Start: 2015-08-01 — End: 2015-08-05
  Administered 2015-08-01 – 2015-08-02 (×2): 5000 [IU] via SUBCUTANEOUS
  Filled 2015-08-01 (×5): qty 1

## 2015-08-01 MED ORDER — TAMSULOSIN HCL 0.4 MG PO CAPS
0.4000 mg | ORAL_CAPSULE | Freq: Two times a day (BID) | ORAL | Status: DC
  Administered 2015-08-01 – 2015-08-04 (×6): 0.4 mg via ORAL
  Filled 2015-08-01 (×7): qty 1

## 2015-08-01 MED ORDER — CAMPHOR-MENTHOL 0.5-0.5 % EX LOTN
TOPICAL_LOTION | CUTANEOUS | Status: DC | PRN
  Filled 2015-08-01: qty 222

## 2015-08-01 MED ORDER — FUROSEMIDE 10 MG/ML IJ SOLN
120.0000 mg | Freq: Four times a day (QID) | INTRAMUSCULAR | Status: AC
  Administered 2015-08-01 – 2015-08-02 (×4): 120 mg via INTRAVENOUS
  Filled 2015-08-01 (×4): qty 12

## 2015-08-01 MED ORDER — CALCIUM ACETATE 667 MG PO CAPS: 2001.0000 mg | ORAL_CAPSULE | Freq: Every day | ORAL | Status: DC

## 2015-08-01 NOTE — ED Notes (Signed)
Pt reports to the ED for eval of SOB and fluid retention x 1 week. Pt denies any CP, lightheaded, dizziness, or diaphoresis. He is supposed to be on dialysis and he had a graft placed on July but they have not used it yet. He has not been urinating anymore. Pt is tachypnic. Pt A&Ox4 and skin warm and dry.

## 2015-08-01 NOTE — ED Provider Notes (Signed)
CSN: [id number]     Arrival date & time 08/01/15  1240 History   First MD Initiated Contact with Patient 08/01/15 1300     Chief Complaint  Patient presents with  . Shortness of Breath     (Consider location/radiation/quality/duration/timing/severity/associated sxs/prior Treatment) Patient is a 74 y.o. male presenting with shortness of breath. The history is provided by the patient.  Shortness of Breath Severity:  Moderate Onset quality:  Gradual Duration:  1 week Timing:  Constant Progression:  Worsening Chronicity:  New Context comment:  CKD 4, decreased urine Relieved by:  Nothing Worsened by:  Nothing tried Ineffective treatments:  Diuretics Associated symptoms: no chest pain, no cough, no fever and no vomiting   Risk factors: no hx of PE/DVT     Past Medical History  Diagnosis Date  . Hypertension   . Incarcerated inguinal hernia, unilateral     right side  . Tobacco abuse   . Varicocele   . 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 Arthritis     bursitis in hip  . 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 Av fistula placement, radiocephalic  01/30/2011    Right w/ ligation of competing venous branch by Dr. Leonides SakeBrian Chen  . Av fistula placement, brachiocephalic  10/19/2010    LEFT  . Av fistula placement, radiocephalic  09/13/2010    LEFT  . Colonoscopy    . Bascilic vein transposition Left 04/08/2015    Procedure: BASILIC VEIN TRANSPOSITION;  Surgeon: Pryor OchoaJames D Lawson, MD;  [facility];  Service: Vascular;  Laterality: Left;   Family History  Problem Relation Age of Onset  . Asthma Mother   . Hypertension Mother   . Alcohol abuse Father   . Hypertension Father   . Hypertension Sister   . Diabetes Sister   . Heart disease Sister   . Hypertension Brother   . Peripheral vascular disease Brother   . Hypertension Daughter    . Heart disease Daughter    Social History  Substance Use Topics  . Smoking status: Current Every Day Smoker -- 0.50 packs/day for 50 years    Types: Cigarettes  . Smokeless tobacco: Never Used  . Alcohol Use: No    Review of Systems  Constitutional: Negative for fever.  Respiratory: Positive for shortness of breath. Negative for cough.   Cardiovascular: Positive for leg swelling. Negative for chest pain.  Gastrointestinal: Negative for vomiting.  All other systems reviewed and are negative.     Allergies  Review of patient's allergies indicates no known allergies.  Home Medications   Prior to Admission medications   Medication Sig Start Date End Date Taking? Authorizing Provider  albuterol-ipratropium (COMBIVENT) 18-103 MCG/ACT inhaler Inhale 2 puffs into the lungs every 6 (six) hours as needed for wheezing. 02/11/13   Genelle GatherKathryn F Glenn, MD  amLODipine (NORVASC) 10 MG tablet Take 1 tablet (10 mg total) by mouth daily. 02/12/13   Genelle GatherKathryn F Glenn, MD  camphor-menthol [facility](SARNA) lotion Apply topically as needed for itching. 04/15/15   Belkys A Regalado, MD  diphenhydrAMINE (BENADRYL) 25 mg capsule Take 1 capsule (25 mg total) by mouth every 6 (six) hours as needed for itching. Patient not taking: Reported on 05/18/2015 04/15/15   Belkys A Regalado, MD  furosemide (LASIX) 40 MG tablet Take 1 tablet (40 mg total) by  mouth daily. 04/15/15   Belkys A Regalado, MD  metoprolol succinate (TOPROL-XL) 50 MG 24 hr tablet Take 25 mg by mouth daily. Take with or immediately following a meal.    Historical Provider, MD  Multiple Vitamin (MULTIVITAMIN WITH MINERALS) TABS tablet Take 1 tablet by mouth daily. Patient not taking: Reported on 05/18/2015 04/15/15   Belkys A Regalado, MD  Tamsulosin HCl (FLOMAX) 0.4 MG CAPS Take 0.4 mg by mouth 2 (two) times daily.     Historical Provider, MD   BP 164/105 mmHg  Pulse 103  Temp(Src) 98.1 F (36.7 C) (Oral)  Resp 24  Ht  (1.803 m)  Wt 172 lb 14.4 oz (78.427  kg)  BMI 24.13 kg/m2  SpO2 92% Physical Exam  Constitutional: He is oriented to person, place, and time. He appears well-developed and well-nourished. No distress.  HENT:  Head: Normocephalic and atraumatic.  Eyes: Conjunctivae are normal.  Neck: Neck supple. No tracheal deviation present.  Cardiovascular: Regular rhythm.  Tachycardia present.   4+ b/l LE pitting edema  Pulmonary/Chest: Effort normal. No respiratory distress. He has rales (lower fields).  Abdominal: Soft. He exhibits no distension.  Neurological: He is alert and oriented to person, place, and time.  Skin: Skin is warm and dry.  Psychiatric: He has a normal mood and affect.    ED Course  Procedures (including critical care time) Labs Review Labs Reviewed  BASIC METABOLIC PANEL - Abnormal; Notable for the following:    Chloride 114 (*)    CO2 19 (*)    BUN 58 (*)    Creatinine, Ser 6.78 (*)    GFR calc non Af Amer 7 (*)    GFR calc Af Amer 8 (*)    All other components within normal limits  BRAIN NATRIURETIC PEPTIDE - Abnormal; Notable for the following:    B Natriuretic Peptide 2177.6 (*)    All other components within normal limits  CBC WITH DIFFERENTIAL/PLATELET - Abnormal; Notable for the following:    RBC 3.87 (*)    Hemoglobin 11.7 (*)    HCT 37.1 (*)    Lymphs Abs 0.4 (*)    All other components within normal limits  URINALYSIS, ROUTINE W REFLEX MICROSCOPIC (NOT AT [facility]) - Abnormal; Notable for the following:    APPearance TURBID (*)    Glucose, UA 100 (*)    Hgb urine dipstick SMALL (*)    Protein, ur >300 (*)    Leukocytes, UA MODERATE (*)    All other components within normal limits  URINE MICROSCOPIC-ADD ON - Abnormal; Notable for the following:    Squamous Epithelial / LPF FEW (*)    Bacteria, UA MANY (*)    Casts GRANULAR CAST (*)    All other components within normal limits  I-STAT VENOUS BLOOD GAS, ED - Abnormal; Notable for the following:    pCO2, Ven 43.8 (*)    pO2, Ven 20.0 (*)     Bicarbonate 19.7 (*)    Acid-base deficit 7.0 (*)    All other components within normal limits  URINE CULTURE    Imaging Review Dg Chest 2 View  08/01/2015  CLINICAL DATA:  74 year old male with acute shortness of breath for 1 week. Patient with renal failure. EXAM: CHEST  2 VIEW COMPARISON:  02/08/2013 chest radiograph FINDINGS: Cardiomegaly identified. Small bilateral pleural effusions, right greater than left, noted. Moderate right lower lung atelectasis with possible consolidation and mild left basilar atelectasis noted. Mild pulmonary vascular congestion is present.  There is no evidence of pneumothorax. No acute bony abnormalities are present. IMPRESSION: Moderate right lower lung atelectasis with possible consolidation and mild right basilar atelectasis. Small bilateral pleural effusions, right greater than left. Cardiomegaly with mild pulmonary vascular congestion. Electronically Signed   By: Harmon Pier M.D.   On: 08/01/2015 13:54   I have personally reviewed and evaluated these images and lab results as part of my medical decision-making.   EKG Interpretation   Date/Time:  Sunday August 01 2015 12:51:06 EDT Ventricular Rate:  104 PR Interval:  118 QRS Duration: 118 QT Interval:  354 QTC Calculation: 465 R Axis:   10 Text Interpretation:  Sinus tachycardia Right bundle branch block Possible  Inferior infarct , age undetermined Abnormal ECG Since last tracing rate  faster Confirmed by Jakyria Bleau MD, Toryn Mcclinton [phone]) on 08/01/2015 1:04:02 PM      MDM   Final diagnoses:  CKD (chronic kidney disease), stage IV (HCC)  Other hypervolemia  Bilateral pleural effusion    74 y.o. male presents with shortness of breath, weight gain, peripheral edema, and decreased UOP over the last week. No hyperkalemia, mildly acidemic, appears clinically fluid overloaded with bilateral pleural effusions and some RLL consolidation. Hypertensive on arrival which he states is not normal for him,  shortness of breath improves with nitroglycerin. BNP further suggests fluid overload and possibly an element of CHF with volume status and hypertension. Urinalysis with pyuria and bacteria which is potential etiology for declining renal function. Covered with rocephin for UTI. Nephrology was consulted and saw Pt, recommending admission and lasix until able to perform dialysis through fistula. Internal Medicine was consulted for admission and will see the Pt in the ED.    Lyndal Pulley, MD 08/02/15 [phone]

## 2015-08-01 NOTE — H&P (Signed)
Date: 08/01/2015               Patient Name:  Shane [name] MRN: [id number]  DOB: 11/23/1940 Age / Sex: 74 y.o., male   PCP: [name], MD         Medical Service: Internal Medicine Teaching Service         Attending Physician: Dr. Tyson Alias, MD    First Contact: Dr. Reubin Milan Pager: [phone]  Second Contact: Dr. Boykin Peek Pager: [phone]       After Hours (After 5p/  First Contact Pager: [phone]  weekends / holidays): Second Contact Pager: [phone]   Chief Complaint: Shortness of breath  History of Present Illness: [name] is a 74yo M with PMH AL amyloidosis, COPD, and HTN who presents with shortness of breath and increased swelling in his legs for the past 1 week. He says that he gets short of breath even when simply talking, let alone with activity, that has been going on for the past week but not getting particularly worse over this time. He tried using his PRN albuterol inhaler, but this has not given him much relief. He usually is able to ambulate without difficulty and perform his ADLs without getting short of breath. He says he has had increased swelling in his legs over this time as well and difficulty making urine but otherwise has no symptoms. He denies fevers, lightheadedness, diaphoresis, chest pain, palpitations, cough, abdominal pain, nausea, or changes in bowel function. He says he has been compliant with all of his medicines, including lasix. He is a current 1/2 ppd smoker, for approximately 50 years. He denies alcohol use or any other recreational drug use. He denies any sick contacts or recent travel.  Of note, patient was admitted in 04/2015 for acute on chronic kidney disease and acute encephalopathy, possibly uremic in origin. He had a LUE AV fistula placed on 04/08/2015, which has not been used yet. He has never received dialysis.  Meds: Current Facility-Administered Medications  Medication Dose Route Frequency Provider Last Rate Last  Dose  . [START ON 08/02/2015] amLODipine (NORVASC) tablet 10 mg  10 mg Oral Daily Marrian Salvage, MD      . Melene Muller ON 08/02/2015] calcium acetate (PHOSLO) capsule 2,001 mg  2,001 mg Oral Q breakfast Tyson Alias, MD      . camphor-menthol [facility]) lotion   Topical PRN Marrian Salvage, MD      . furosemide (LASIX) 120 mg in dextrose 5 % 50 mL IVPB  120 mg Intravenous Q6H Delano Metz, MD 62 mL/hr at 08/01/15 1654 120 mg at 08/01/15 1654  . heparin injection 5,000 Units  5,000 Units Subcutaneous 3 times per day Marrian Salvage, MD      . metoprolol succinate (TOPROL-XL) 24 hr tablet 50 mg  50 mg Oral Daily Marrian Salvage, MD      . nitroGLYCERIN (NITROSTAT) SL tablet 0.4 mg  0.4 mg Sublingual Q5 min PRN Lyndal Pulley, MD   0.4 mg at 08/01/15 1411  . tamsulosin (FLOMAX) capsule 0.4 mg  0.4 mg Oral BID Marrian Salvage, MD        Allergies: Allergies as of 08/01/2015  . (No Known Allergies)   Past Medical History  Diagnosis Date  . Hypertension   . Incarcerated inguinal hernia, unilateral     right side  . Tobacco abuse   . Varicocele   . 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 Arthritis     bursitis in hip  . 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 Av fistula placement, radiocephalic  01/30/2011    Right w/ ligation of competing venous branch by Dr. Leonides Sake  . Av fistula placement, brachiocephalic  10/19/2010    LEFT  . Av fistula placement, radiocephalic  09/13/2010    LEFT  . Colonoscopy    . Bascilic vein transposition Left 04/08/2015    Procedure: BASILIC VEIN TRANSPOSITION;  Surgeon: Pryor Ochoa, MD;  Location: [facility] OR;  Service: Vascular;  Laterality: Left;   Family History  Problem Relation Age of Onset  . Asthma Mother   . Hypertension Mother   . Alcohol abuse Father   . Hypertension Father   . Hypertension  Sister   . Diabetes Sister   . Heart disease Sister   . Hypertension Brother   . Peripheral vascular disease Brother   . Hypertension Daughter   . Heart disease Daughter    Social History   Social History  . Marital Status: Married    Spouse Name: N/A  . Number of Children: N/A  . Years of Education: N/A   Occupational History  . Not on file.   Social History Main Topics  . Smoking status: Current Every Day Smoker -- 0.50 packs/day for 50 years    Types: Cigarettes  . Smokeless tobacco: Never Used  . Alcohol Use: No  . Drug Use: No  . Sexual Activity: Not on file   Other Topics Concern  . Not on file   Social History Narrative    Review of Systems: Pertinent items noted in HPI and remainder of comprehensive ROS otherwise negative.  Physical Exam: Blood pressure 180/99, pulse 106, temperature 97.3 F (36.3 C), temperature source Oral, resp. rate 22, height  (1.803 m), weight 172 lb 14.4 oz (78.427 kg), SpO2 98 %. BP 180/99 mmHg  Pulse 106  Temp(Src) 97.3 F (36.3 C) (Oral)  Resp 22  Ht  (1.803 m)  Wt 172 lb 14.4 oz (78.427 kg)  BMI 24.13 kg/m2  SpO2 98%  General Appearance:    Alert, cooperative, no distress, appears stated age  Head:    Normocephalic, without obvious abnormality, atraumatic  Eyes:    PERRL, conjunctiva/corneas clear, EOM's intact, fundi    benign, both eyes       Ears:    Normal TM's and external ear canals, both ears  Nose:   Nares normal, septum midline, mucosa normal, no drainage    or sinus tenderness  Throat:   Lips, mucosa, and tongue normal; teeth and gums normal  Neck:   Supple, symmetrical, trachea midline, no adenopathy;       thyroid:  No enlargement/tenderness/nodules; no carotid   bruit or JVD  Back:     Symmetric, no curvature, ROM normal, no CVA tenderness  Lungs:     Mild bilateral expiratory wheezes, no rales or rhonchi heard,  respirations unlabored  Chest wall:    No tenderness or deformity  Heart:     Distant heart sounds. Regular rate and rhythm, S1 and S2 normal, S3 heard best at the apex, no murmurs or rubs heard  Abdomen:     Soft, non-tender, bowel sounds active all four quadrants,    no masses, no organomegaly  Extremities:   Profound pitting  edema up to the knees bilaterally. Extremities normal, atraumatic, no cyanosis or edema  Pulses:   2+ and symmetric all extremities  Skin:   Skin color, texture, turgor normal, no rashes or lesions  Lymph nodes:   Cervical, supraclavicular, and axillary nodes normal  Neurologic:   CNII-XII intact. Normal strength, sensation and reflexes      throughout    Lab results: Basic Metabolic Panel:  Recent Labs  78/29/56 1321  NA 143  K 4.7  CL 114*  CO2 19*  GLUCOSE 93  BUN 58*  CREATININE 6.78*  CALCIUM 9.1  CBC:  Recent Labs  08/01/15 1321  WBC 4.6  NEUTROABS 3.4  HGB 11.7*  HCT 37.1*  MCV 95.9  PLT 152   Cardiac Enzymes: Initial troponin I 0.01 on 10/23  BNP: 2177 on 10/23  Urinalysis:  Recent Labs  08/01/15 1430  COLORURINE YELLOW  LABSPEC 1.020  PHURINE 5.0  GLUCOSEU 100*  HGBUR SMALL*  BILIRUBINUR NEGATIVE  KETONESUR NEGATIVE  PROTEINUR >300*  UROBILINOGEN 0.2  NITRITE NEGATIVE  LEUKOCYTESUR MODERATE*   Imaging results:  Dg Chest 2 View  08/01/2015  CLINICAL DATA:  74 year old male with acute shortness of breath for 1 week. Patient with renal failure. EXAM: CHEST  2 VIEW COMPARISON:  02/08/2013 chest radiograph FINDINGS: Cardiomegaly identified. Small bilateral pleural effusions, right greater than left, noted. Moderate right lower lung atelectasis with possible consolidation and mild left basilar atelectasis noted. Mild pulmonary vascular congestion is present. There is no evidence of pneumothorax. No acute bony abnormalities are present. IMPRESSION: Moderate right lower lung atelectasis with possible consolidation and mild right basilar atelectasis. Small bilateral pleural effusions, right greater than left.  Cardiomegaly with mild pulmonary vascular congestion. Electronically Signed   By: Harmon Pier M.D.   On: 08/01/2015 13:54    Other results: EKG: normal EKG, normal sinus rhythm, unchanged from previous tracings, RBBB.  Assessment & Plan by Problem: Active Problems:   Volume overload 1. Volume overload - Patient presents with 1 week of shortness of breath and increased leg swelling and decreased urine output, with findings of S3 and impressive lower extremity edema on physical exam. Patient is not in respiratory distress currently, satting high 90s on room air and with unlabored breathing. Patient reports he has been compliant with his home lasix dose. CXR in ED showed small bilateral pleural effusions, cardiomegaly with mild pulmonary vascular congestion and atelectatic changes, BNP 2177 with EKG unchaged from previous studies and initial troponins negative. Creatinine is elevated at 6.8, compared to 8 on previous admission 04/2015 and 4 in 2014. His symptoms are most likely from volume overload, likely due to acute exacerbation or progressive renal disease. 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 Last echo 04/2015 showed a normal EF without regional wall abnormalities, making this less likely responsible for his acute presentation. -Lasix IV -Nephrology consulted for further recs and possible dialysis- will discuss with vascular whether AV graft is usable for possible dialysis. If not, may need to consider temporary access -Follow up BMPs, phos -Cardiac monitoring -Strict I's & O's, daily weights  2. Pyuria - patient is without dysuria, but reports decreased urine output over the past 1 week. UA shows many bacteria, +WBC, nitrite negative but few squamous epithelial cells. -S/p 1 dose of ceftriaxone in ED -Re-check UA -F/u UCx  3. Hypertension -Continue home norvasc  qd, metoprolol succinate  qd, tamsulosin 0.4 bid  Diet: Renal  PPX -DVT:  heparin   Dispo: Disposition  is deferred at this time, awaiting improvement of current medical problems. Anticipated discharge in approximately 2-4 day(s).   The patient does have a current PCP (Ailene RavelMaura L Hamrick, MD) and [facility].  The patient does not have transportation limitations that hinder transportation to clinic appointments.  Signed: Darrick HuntsmanWilliam R Brittnie Lewey, MD 08/01/2015, 5:56 PM

## 2015-08-01 NOTE — Progress Notes (Signed)
Had a HD nurse evaluate the AVF and should be OK to use.  Plan HD tomorrow with 17 ga needles.  See orders.   Vinson Moselleob Milany Geck MD [facility] pager [phone]    cell [phone] 08/01/2015, 10:56 PM

## 2015-08-01 NOTE — Consult Note (Signed)
Renal Service Consult Note [name]  Shane [name] 08/01/2015 Shane [name] D Requesting Physician:  Shane [name] , [facility] ED  Reason for Consult:  CKD pateint w FTT and edema HPI: The patient is a 74 y.o. year-old with hx of CKD, amyloidosis, tobacco, HTN who presented to ED today with SOB, leg swelling. No CP or abd pain, no n/v/d.  CXR showed new R pleural effusion. Family thinks patient may need to start on dialysis.   Patient was here in July with a/c renal faiulre and creat 7-8 range. Today creat is 6.6.  Patient has no appetite and low energy, no n/v/ , no joint pains and no diarrhea.  Denies any dysuria.  No nsaids'.     Past Medical History  Past Medical History  Diagnosis Date  . Hypertension   . Incarcerated inguinal hernia, unilateral     right side  . Tobacco abuse   . Varicocele   . 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 Arthritis     bursitis in hip  . 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 Av fistula placement, radiocephalic  01/30/2011    Right w/ ligation of competing venous branch by Shane. Leonides [name]  . Av fistula placement, brachiocephalic  10/19/2010    LEFT  . Av fistula placement, radiocephalic  09/13/2010    LEFT  . Colonoscopy    . Bascilic vein transposition Left 04/08/2015    Procedure: BASILIC VEIN TRANSPOSITION;  Surgeon: Shane Ochoa, MD;  Location: [facility] OR;  Service: Vascular;  Laterality: Left;   Family History  Family History  Problem Relation Age of Onset  . Asthma Mother   . Hypertension Mother   . Alcohol abuse Father   . Hypertension Father   . Hypertension Sister   . Diabetes Sister   . Heart disease Sister   . Hypertension Brother   . Peripheral vascular disease Brother   . Hypertension Daughter   . Heart disease Daughter    Social History  reports  that he has been smoking Cigarettes.  He has a 25 pack-year smoking history. He has never used smokeless tobacco. He reports that he does not drink alcohol or use illicit drugs. Allergies No Known Allergies Home medications Prior to Admission medications   Medication Sig Start Date End Date Taking? Authorizing Provider  albuterol-ipratropium (COMBIVENT) 18-103 MCG/ACT inhaler Inhale 2 puffs into the lungs every 6 (six) hours as needed for wheezing. 02/11/13  Yes Shane Gather, MD  amLODipine (NORVASC) 10 MG tablet Take 1 tablet (10 mg total) by mouth daily. 02/12/13  Yes Shane Gather, MD  calcium acetate (PHOSLO) 667 MG capsule Take 2,001 mg by mouth daily.   Yes Historical Provider, MD  camphor-menthol Wynelle Fanny) lotion Apply topically as needed for itching. 04/15/15  Yes Shane A Regalado, MD  furosemide (LASIX) 40 MG tablet Take 1 tablet (40 mg total) by mouth daily. Patient taking differently: Take 40 mg by mouth 2 (two) times daily.  04/15/15  Yes Shane A Regalado, MD  metoprolol succinate (TOPROL-XL) 50 MG 24 hr tablet Take 50 mg by mouth daily. Take with or immediately following a meal.   Yes Historical Provider, MD  Tamsulosin HCl (FLOMAX) 0.4 MG CAPS Take 0.4 mg by mouth 2 (two) times daily.  Yes Historical Provider, MD  diphenhydrAMINE (BENADRYL) 25 mg capsule Take 1 capsule (25 mg total) by mouth every 6 (six) hours as needed for itching. Patient not taking: Reported on 05/18/2015 04/15/15   Shane A Regalado, MD  Multiple Vitamin (MULTIVITAMIN WITH MINERALS) TABS tablet Take 1 tablet by mouth daily. Patient not taking: Reported on 05/18/2015 04/15/15   Shane A Regalado, MD   Liver Function Tests No results for input(s): AST, ALT, ALKPHOS, BILITOT, PROT, ALBUMIN in the last 168 hours. No results for input(s): LIPASE, AMYLASE in the last 168 hours. CBC  Recent Labs Lab 08/01/15 1321  WBC 4.6  NEUTROABS 3.4  HGB 11.7*  HCT 37.1*  MCV 95.9  PLT 152   Basic Metabolic Panel  Recent  Labs Lab 08/01/15 1321  NA 143  K 4.7  CL 114*  CO2 19*  GLUCOSE 93  BUN 58*  CREATININE 6.78*  CALCIUM 9.1    Filed Vitals:   08/01/15 1411 08/01/15 1415 08/01/15 1445 08/01/15 1530  BP: 171/108 160/98 162/100 162/95  Pulse: 100 97 97 90  Temp:      TempSrc:      Resp: 22 19 16 23   Height:      Weight:      SpO2:  96% 100% 100%   Exam Alert, chronically-ill appearing older adult male not in distress No rash, cyanosis or gangrene Sclera anicteric, throat clear No jvd or bruits Chest dec'd R base 1/2 up, no bronchial BS. L side clear RRR soft SEM no RG Abd soft ntnd no mass or ascites GU normal male Ext has 3+ pitting edema bilat LE's Neuro no asterixis, alert nonfocal LUA AVF has AVF which is partially mature, not very thick-walled  CXR R pleural effusion about 1/3 up, no other findings Na 143   K 4.7  CO2 19   BUN 58   Creat 6.78   WBC 4k  Hb 11 UA >300 prot, wbc 21-50, rbc none, turbid  Assessment: 1. 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   2. S/p BVT AVF on 04/08/15 3. R pleural effusion / vol overload / anasarca - lasix IV for now then HD when access determined 4. Tobacco 5. AL amyloid   Plan- lasix IV for now, HD will be initiated this admission. Will see if AVF is ready to use, if not get a tunneled HD cath put in.   Shane Moselleob Ned Kakar MD [facility] pager [phone]    cell [phone] 08/01/2015, 4:07 PM

## 2015-08-02 ENCOUNTER — Inpatient Hospital Stay (HOSPITAL_COMMUNITY): Payer: Medicare Other

## 2015-08-02 DIAGNOSIS — E8779 Other fluid overload: Principal | ICD-10-CM

## 2015-08-02 LAB — RENAL FUNCTION PANEL
ANION GAP: 8 (ref 5–15)
Albumin: 3 g/dL — ABNORMAL LOW (ref 3.5–5.0)
BUN: 62 mg/dL — ABNORMAL HIGH (ref 6–20)
CHLORIDE: 116 mmol/L — AB (ref 101–111)
CO2: 21 mmol/L — AB (ref 22–32)
CREATININE: 6.62 mg/dL — AB (ref 0.61–1.24)
Calcium: 9.1 mg/dL (ref 8.9–10.3)
GFR calc non Af Amer: 7 mL/min — ABNORMAL LOW (ref >60)
GFR, EST AFRICAN AMERICAN: 8 mL/min — AB (ref >60)
Glucose, Bld: 93 mg/dL (ref 65–99)
POTASSIUM: 5.4 mmol/L — AB (ref 3.5–5.1)
Phosphorus: 6.1 mg/dL — ABNORMAL HIGH (ref 2.5–4.6)
Sodium: 145 mmol/L (ref 135–145)

## 2015-08-02 LAB — HEPATITIS B CORE ANTIBODY, IGM: Hep B C IgM: NEGATIVE

## 2015-08-02 LAB — HEPATITIS B SURFACE ANTIGEN: Hepatitis B Surface Ag: NEGATIVE

## 2015-08-02 LAB — HEPATITIS B SURFACE ANTIBODY,QUALITATIVE: Hep B S Ab: NONREACTIVE

## 2015-08-02 MED ORDER — ALBUTEROL SULFATE (2.5 MG/3ML) 0.083% IN NEBU
2.5000 mg | INHALATION_SOLUTION | RESPIRATORY_TRACT | Status: DC | PRN
  Administered 2015-08-02: 2.5 mg via RESPIRATORY_TRACT
  Filled 2015-08-02: qty 3

## 2015-08-02 MED ORDER — AMOXICILLIN 250 MG/5ML PO SUSR
500.0000 mg | Freq: Two times a day (BID) | ORAL | Status: DC
  Administered 2015-08-02: 500 mg via ORAL
  Filled 2015-08-02 (×3): qty 10

## 2015-08-02 MED ORDER — RENA-VITE PO TABS
1.0000 | ORAL_TABLET | Freq: Every day | ORAL | Status: DC
  Administered 2015-08-03 – 2015-08-04 (×2): 1 via ORAL
  Filled 2015-08-02 (×3): qty 1

## 2015-08-02 MED ORDER — AMOXICILLIN 500 MG PO CAPS: 500.0000 mg | ORAL_CAPSULE | Freq: Two times a day (BID) | ORAL | Status: DC

## 2015-08-02 NOTE — Progress Notes (Signed)
Bed alarm going off, RN entered room with 2 other RNs. Pt states that he wants me to leave. Other RNs ok to be in room.

## 2015-08-02 NOTE — Progress Notes (Signed)
RT came to give neb tx. RT tried mask neb tx. Pt took one breath and states he had enough.  RT tried mouthpiece tx -pt took one breath states he had enough. Pt refuses to take neb tx. Pt states he is breathing more softly and better. RN at bedside.

## 2015-08-02 NOTE — Progress Notes (Signed)
Pt pulling monitor leads off, allowed [place], RN to put leads back on then told both RN to leave room. Pt refusing to let either RN assess patient or check O2 sat. Pt in no distress at this time, resting comfortably in bed otherwise.

## 2015-08-02 NOTE — Progress Notes (Signed)
RN entered room with MD, pt refused for RN to come in room at this time. Refused chest Xray. Per day shift RN, pt son to come to hospital to see patient tonight. MD notified and asked RN to call MD when son gets here.

## 2015-08-02 NOTE — Progress Notes (Signed)
Subjective: Shane [name] is a 75yo M with PMHx of AL amyloidosis, COPD, and HTN admitted for shortness of breath and increased swelling in his legs for the past 1 week. Overnight, he had no acute events. This morning, he feels that his breathing is much better and has no other complaints at this time.  Objective: Vital signs in last 24 hours: Filed Vitals:   08/01/15 2211 08/02/15 0024 08/02/15 0418 08/02/15 0833  BP: 163/99 146/94 154/100 161/96  Pulse: 86 86 86 89  Temp: 98.4 F (36.9 C) 98 F (36.7 C) 98 F (36.7 C) 97.7 F (36.5 C)  TempSrc: Oral Oral Oral Oral  Resp: Height:      Weight:   175 lb 6.4 oz (79.561 kg)   SpO2: 98% 97% 98% 100%   Weight change:   Intake/Output Summary (Last 24 hours) at 08/02/15 0934 Last data filed at 08/02/15 0835  Gross per 24 hour  Intake    534 ml  Output    150 ml  Net    384 ml   BP 161/96 mmHg  Pulse 89  Temp(Src) 97.7 F (36.5 C) (Oral)  Resp 18  Ht  (1.803 m)  Wt 175 lb 6.4 oz (79.561 kg)  BMI 24.47 kg/m2  SpO2 100%  General Appearance:    Alert, cooperative, no distress, appears stated age  Neck:   Supple, symmetrical, trachea midline, no adenopathy;       thyroid:  No enlargement/tenderness/nodules; no carotid   Bruit, JVD noted  Lungs:     Clear to auscultation bilaterally, respirations unlabored. No wheezes, rales, or rhonchi heard  Heart:    Distant heart sounds. Regular rate and rhythm, S1 and S2 normal, S3 heard best at the apex, no murmurs or rubs  Abdomen:     Soft, non-tender, bowel sounds active all four quadrants,    no masses, no organomegaly  Extremities:   Profound pitting edema up the the knees bilaterally.  Pulses:   2+ and symmetric all extremities. Unable to assess DP pulses b/l 2/2 edema. PT pulses 2+ and symmetric.  Skin:   Skin color, texture, turgor normal, no rashes or lesions  Lymph nodes:   Cervical, supraclavicular, and axillary nodes normal  Neurologic:   CNII-XII intact.  Normal strength, sensation and reflexes      throughout   Lab Results: Basic Metabolic Panel:  Recent Labs Lab 08/01/15 1321 08/02/15 0350  NA 143 145  K 4.7 5.4*  CL 114* 116*  CO2 19* 21*  GLUCOSE 93 93  BUN 58* 62*  CREATININE 6.78* 6.62*  CALCIUM 9.1 9.1  PHOS 5.4* 6.1*   Liver Function Tests:  Recent Labs Lab 08/02/15 0350  ALBUMIN 3.0*   CBC:  Recent Labs Lab 08/01/15 1321  WBC 4.6  NEUTROABS 3.4  HGB 11.7*  HCT 37.1*  MCV 95.9  PLT 152   Studies/Results: Dg Chest 2 View  08/01/2015  CLINICAL DATA:  73 year old male with acute shortness of breath for 1 week. Patient with renal failure. EXAM: CHEST  2 VIEW COMPARISON:  02/08/2013 chest radiograph FINDINGS: Cardiomegaly identified. Small bilateral pleural effusions, right greater than left, noted. Moderate right lower lung atelectasis with possible consolidation and mild left basilar atelectasis noted. Mild pulmonary vascular congestion is present. There is no evidence of pneumothorax. No acute bony abnormalities are present. IMPRESSION: Moderate right lower lung atelectasis with possible consolidation and mild right basilar atelectasis. Small bilateral pleural effusions, right greater  than left. Cardiomegaly with mild pulmonary vascular congestion. Electronically Signed   By: Harmon PierJeffrey  Hu M.D.   On: 08/01/2015 13:54   Assessment/Plan: Active Problems:   Volume overload 1.1. Volume overload - Patient presents with 1 week of shortness of breath and increased leg swelling and decreased urine output, with findings of S3 and impressive lower extremity edema on physical exam. Patient is not in respiratory distress currently, satting high 90s on room air and with unlabored breathing. Patient reports he has been compliant with his home lasix dose. CXR in ED showed small bilateral pleural effusions, cardiomegaly with mild pulmonary vascular congestion and atelectatic changes, BNP 2177 with EKG unchaged from previous studies  and initial troponins negative. Creatinine is elevated at 6.8, compared to 8 on previous admission 04/2015 and 4 in 2014. His symptoms are most likely from volume overload, likely due to acute exacerbation or progressive renal disease. 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 Last echo 04/2015 showed a normal EF without regional wall abnormalities, making this less likely responsible for his acute presentation. -Nephrology following, patient to receive HD today -Continue lasix IV -Follow up BMPs, phos, HD labs (incl. HBV, HIV, TB) -Cardiac monitoring -Strict I's & O's, daily weights  2. Pyuria - patient is without dysuria, but reports decreased urine output over the past 1 week. UA shows many bacteria, +WBC, nitrite negative but few squamous epithelial cells. -S/p 1 dose of ceftriaxone in ED -Re-check UA -F/u UCx  Dispo: Disposition is deferred at this time, awaiting improvement of current medical problems.  Anticipated discharge in approximately 3-4 day(s).   The patient does have a current PCP (Ailene RavelMaura L Hamrick, MD) and [facility].  The patient does not have transportation limitations that hinder transportation to clinic appointments.  LOS: 1 day   Shane HuntsmanWilliam R Ivis Henneman, MD 08/02/2015, 9:34 AM

## 2015-08-02 NOTE — Progress Notes (Signed)
1830 pt returns from HD. Pt stable and A0x4. Pt appears to be wheezing. RT paged and requested neb tx for patient. Pt agrees he needs neb tx. Pt set up for dinner while waiting for RT. 1845 RT called and states pt is refusing neb. Upon assessment pt now very confused and agitated. Pt appears completely different than assessment 15 min prior. Pt refusing neb, refusing soiled bed to be changed, refusing CBG. Pt AO only to self. Pt wheezing audibly. Vitals stable. Internal Medicine Teaching Service Paged and aware of situation. Rapid Response RN called. Stat Chest Xray ordered. Son Morton Stallllen Duggan called and asked to come sit at bedside with pt while confused to see if pt becomes reoriented.  1900 Bedside report done with Garwin BrothersMeredith RN.

## 2015-08-02 NOTE — Progress Notes (Signed)
S:Eating some, still some SOB O:BP 161/96 mmHg  Pulse 89  Temp(Src) 97.7 F (36.5 C) (Oral)  Resp 18  Ht 5\' 11"  (1.803 m)  Wt 79.561 kg (175 lb 6.4 oz)  BMI 24.47 kg/m2  SpO2 100%  Intake/Output Summary (Last 24 hours) at 08/02/15 0857 Last data filed at 08/02/15 0835  Gross per 24 hour  Intake    534 ml  Output    150 ml  Net    384 ml   Weight change:  ZOX:WRUEAGen:awake and alert CVS:RRR Resp:Decreased BS bases R>L, exp wheezes Abd:+ BS NT ND Ext: 4+ edema, LUA AVF + bruit NEURO:CNI Ox3 mild asterixis   . amLODipine  10 mg Oral Daily  . calcium acetate  2,001 mg Oral Q breakfast  . furosemide  120 mg Intravenous Q6H  . heparin  5,000 Units Subcutaneous 3 times per day  . metoprolol succinate  50 mg Oral Daily  . multivitamin  1 tablet Oral QHS  . tamsulosin  0.4 mg Oral BID   Dg Chest 2 View  08/01/2015  CLINICAL DATA:  74 year old male with acute shortness of breath for 1 week. Patient with renal failure. EXAM: CHEST  2 VIEW COMPARISON:  02/08/2013 chest radiograph FINDINGS: Cardiomegaly identified. Small bilateral pleural effusions, right greater than left, noted. Moderate right lower lung atelectasis with possible consolidation and mild left basilar atelectasis noted. Mild pulmonary vascular congestion is present. There is no evidence of pneumothorax. No acute bony abnormalities are present. IMPRESSION: Moderate right lower lung atelectasis with possible consolidation and mild right basilar atelectasis. Small bilateral pleural effusions, right greater than left. Cardiomegaly with mild pulmonary vascular congestion. Electronically Signed   By: Harmon PierJeffrey  Hu M.D.   On: 08/01/2015 13:54   BMET    Component Value Date/Time   NA 143 08/01/2015 1321   K 4.7 08/01/2015 1321   CL 114* 08/01/2015 1321   CO2 19* 08/01/2015 1321   GLUCOSE 93 08/01/2015 1321   BUN 58* 08/01/2015 1321   CREATININE 6.78* 08/01/2015 1321   CALCIUM 9.1 08/01/2015 1321   GFRNONAA 7* 08/01/2015 1321   GFRAA 8* 08/01/2015 1321   CBC    Component Value Date/Time   WBC 4.6 08/01/2015 1321   WBC 4.7 12/14/2010 0943   RBC 3.87* 08/01/2015 1321   RBC 4.13* 12/14/2010 0943   HGB 11.7* 08/01/2015 1321   HGB 14.0 12/14/2010 0943   HCT 37.1* 08/01/2015 1321   HCT 41.0 12/14/2010 0943   PLT 152 08/01/2015 1321   PLT 220 12/14/2010 0943   MCV 95.9 08/01/2015 1321   MCV 99.4* 12/14/2010 0943   MCH 30.2 08/01/2015 1321   MCH 34.0* 12/14/2010 0943   MCHC 31.5 08/01/2015 1321   MCHC 34.2 12/14/2010 0943   RDW 14.4 08/01/2015 1321   RDW 13.9 12/14/2010 0943   LYMPHSABS 0.4* 08/01/2015 1321   LYMPHSABS 1.0 12/14/2010 0943   MONOABS 0.6 08/01/2015 1321   MONOABS 0.5 12/14/2010 0943   EOSABS 0.2 08/01/2015 1321   EOSABS 0.4 12/14/2010 0943   BASOSABS 0.0 08/01/2015 1321   BASOSABS 0.0 12/14/2010 0943     Assessment:  1. New ESRD sec amyloid 2. Vol overload 3. HTN  Plan: 1. HD today 2. Start process for outpt HD spot 3. Start renavite 4. Get info from office  Kalisa Girtman T

## 2015-08-02 NOTE — Progress Notes (Addendum)
Called by RN regarding respiratory distress. Patient refused CXR. Urine culture notable for Enterococcus from earlier today.  At bedside, patient was smiling inappropriately while answering questions but did not appear to be in any sort of distress. Reported he recognized me though this was our first encounter. Refused to answer questions regarding orientation since he felt he was being interrogated. Refused physical exam and attempted to explain to him he had a UTI and that he needed treatment though replied, "Nothing is wrong with me." Requested we do not come back for the remainder of the night.   Per RN, son is on his way to help convince him he needs treatment and will attempt to reorient patient non-pharmacologically.  ADDENDUM 08/02/2015  8:39 PM:  Spoke with son on arrival who described me his father's temperament and his distrust of the medical profession having watched his father die through the course of his illness and multiple hospitalizations. Other than smoking, he denies any recent alcohol abuse by his father.   In the absence of other causes, I explained to his son that his current presentation is likely related to the UTI and that we would want to treat it early lest it advance to a bloodstream infection to which he was agreeable. He agreed to spend the night with his father to ensure we can start treatment.

## 2015-08-02 NOTE — Progress Notes (Signed)
Hemodialysis- Pt tolerated well. Venous chamber clotted with 18 minutes remaining on tx, all blood able to be given back. Vitals wnl and patient has no complaints. Report called to 3E.

## 2015-08-03 ENCOUNTER — Inpatient Hospital Stay (HOSPITAL_COMMUNITY): Payer: Medicare Other

## 2015-08-03 DIAGNOSIS — N390 Urinary tract infection, site not specified: Secondary | ICD-10-CM

## 2015-08-03 DIAGNOSIS — B952 Enterococcus as the cause of diseases classified elsewhere: Secondary | ICD-10-CM

## 2015-08-03 HISTORY — DX: Enterococcus as the cause of diseases classified elsewhere: N39.0

## 2015-08-03 HISTORY — DX: Enterococcus as the cause of diseases classified elsewhere: B95.2

## 2015-08-03 LAB — URINE CULTURE

## 2015-08-03 LAB — HIV ANTIBODY (ROUTINE TESTING W REFLEX): HIV Screen 4th Generation wRfx: NONREACTIVE

## 2015-08-03 LAB — PARATHYROID HORMONE, INTACT (NO CA): PTH: 360 pg/mL — ABNORMAL HIGH (ref 15–65)

## 2015-08-03 MED ORDER — DOXERCALCIFEROL 4 MCG/2ML IV SOLN
2.0000 ug | INTRAVENOUS | Status: DC
  Administered 2015-08-04: 2 ug via INTRAVENOUS
  Filled 2015-08-03: qty 2

## 2015-08-03 MED ORDER — AMOXICILLIN 250 MG/5ML PO SUSR
500.0000 mg | Freq: Every day | ORAL | Status: DC
  Administered 2015-08-03 – 2015-08-04 (×2): 500 mg via ORAL
  Filled 2015-08-03 (×3): qty 10

## 2015-08-03 NOTE — Progress Notes (Signed)
Pt AOx4. Pt instructed of high risk fall potential while hospitalized. Pt wishes to "get up ad lib" with risk despite efforts to prevent falls with bed alarm. Will continue to utilize other fall risk precautions with pt.   Jilda PandaBethany Shemeka Wardle RN

## 2015-08-03 NOTE — Evaluation (Signed)
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  Pt presents with increased LE swelling and SOB.  Clinical Impression  Pt presents with impairments as indicated below.  Pt would benefit from further acute PT services to maximize safety and independence with functional mobility and to increase activity tolerance.  HHPT recommended upon discharge at this point.    Follow Up Recommendations Home health PT;Supervision for mobility/OOB    Equipment Recommendations  Other (comment) (TBD)    Recommendations for Other Services OT consult     Precautions / Restrictions Precautions Precautions: Fall Restrictions Weight Bearing Restrictions: No      Mobility  Bed Mobility Overal bed mobility: Needs Assistance Bed Mobility: Supine to Sit;Sit to Supine     Supine to sit: Supervision Sit to supine: Supervision   General bed mobility comments: VCs for technique.  Pt requiring increased time to perform.  Transfers Overall transfer level: Needs assistance   Transfers: Sit to/from Stand Sit to Stand: Min guard         General transfer comment: VCs for hand placement and pt demonstrating poor safety with stand to sit, requiring cueing to make sure bed was behind him prior to sitting.  Ambulation/Gait Ambulation/Gait assistance: Min guard Ambulation Distance (Feet): 10 Feet Assistive device: Straight cane Gait Pattern/deviations: Step-through pattern;Drifts right/left;Decreased stride length Gait velocity: slower Gait velocity interpretation: Below normal speed for age/gender General Gait Details: Pt attempting to furniture walk with other hand.  Poor safety with ambulation requiring Min guard.  Pt with increased dyspnea with ambulation though O2 sats stable on RA with ambualtion.   Pt with lack of motivation, agreeing only to walk to door of room and back.  Stairs            Wheelchair Mobility    Modified Rankin (Stroke Patients Only)       Balance Overall balance assessment: Needs assistance   Sitting balance-Leahy Scale: Fair     Standing balance support: Single extremity supported;During functional activity Standing balance-Leahy Scale: Fair                               Pertinent Vitals/Pain Pain Assessment: No/denies pain    Home Living Family/patient expects to be discharged to:: Private residence Living Arrangements: Spouse/significant other;Children Available Help at Discharge: Family;Available PRN/intermittently Type of Home: House Home Access: Ramped entrance     Home Layout: One level Home Equipment: Cane - single point Additional Comments: Pt says that he uses the [facility] at home.    Prior Function Level of Independence: Independent with assistive device(s)         Comments: Home set-up and prior function provided by pt though question accuracy due to cognitive impairments.     Hand Dominance        Extremity/Trunk Assessment   Upper Extremity Assessment: Overall WFL for tasks assessed           Lower Extremity Assessment: Overall WFL for tasks assessed         Communication   Communication: No difficulties  Cognition Arousal/Alertness: Awake/alert (tired and lacking motivation to participate with therapy) Behavior During Therapy: Agitated Overall Cognitive Status: No family/caregiver present to determine baseline cognitive functioning Area of Impairment: Orientation Orientation Level: Disoriented to;Time (answered  incorrectly when asked his birthday)   Memory: Decreased short-term memory         General Comments: Pt overall confused and somewhat agitated, though unsure of baseline.  Pt with limited motivation to participate with therapy.    General Comments General comments (skin  integrity, edema, etc.): Pt limited by agitation and limited participation.    Exercises        Assessment/Plan    PT Assessment Patient needs continued PT services  PT Diagnosis Difficulty walking;Altered mental status   PT Problem List    PT Treatment Interventions DME instruction;Gait training;Functional mobility training;Therapeutic activities;Therapeutic exercise;Balance training;Patient/family education   PT Goals (Current goals can be found in the Care Plan section) Acute Rehab PT Goals Patient Stated Goal: none stated PT Goal Formulation: With patient Time For Goal Achievement: 08/17/15 Potential to Achieve Goals: Good    Frequency Min 3X/week   Barriers to discharge        Co-evaluation               End of Session Equipment Utilized During Treatment: Gait belt;Oxygen (on 2L before and after ambulation) Activity Tolerance: Treatment limited secondary to agitation Patient left: in bed;with call bell/phone within reach;with bed alarm set Nurse Communication: Mobility status         Time: [id number] PT Time Calculation (min) (ACUTE ONLY): 23 min   Charges:   PT Evaluation $Initial PT Evaluation Tier I: 1 Procedure PT Treatments $Therapeutic Activity: 8-22 mins   PT G Codes:        Dempsey Knotek 08/03/2015, 3:20 PM  Arnoldo MoraleAmanda Leontina Skidmore, SPT

## 2015-08-03 NOTE — Progress Notes (Signed)
Pt removed telemetry monitor and IV. Pt bed wet, refusing to let RN change bed, place pt on telemetry, or start IV. Pt only attempts to get out of bed currently when needing to void, then returns to bed with 1 assist. Son still at bedside. Dr. Allena KatzPatel notified of events. Will notify MD if any other changes in mental status or VS this AM. Will continue to monitor. Huel Coventryosenberger, Lorea Kupfer A, RN

## 2015-08-03 NOTE — Progress Notes (Signed)
PHARMACY - ENTEROCOCCUS UTI  74 y/o male with new ESRD and transitioned to HD now. He grew Enterococcus in urine culture sensitive to ampicillin. Pharmacy consulted to treat with fosfomycin but spoke with Dr. Delane GingerGill about continuing with amoxicillin and leaving fosfomycin for more resistant bugs. She was agreeable. Also changed amoxicillin to 500 mg PO qhs to be given after HD on HD days.  MarshallJennifer , 17[address] Rainbow BoulevardPharm.D., BCPS Clinical Pharmacist Pager: [phone] 08/03/2015 9:27 AM

## 2015-08-03 NOTE — Progress Notes (Signed)
Utilization review completed. Geovonni Meyerhoff, RN, BSN. 

## 2015-08-03 NOTE — Progress Notes (Signed)
Patient continues to get out of bed without assistance. Education given on the importance of calling staff for assistance, but patient refuses to call staff ("If I'm going to get up, I'm going to get up"). Offer was given to patient to move closer to the nurses' station. Patient stated he did not want to move and told staff to leave his room. Bed alarm is on. Consulting civil engineerCharge RN notified. Will continue to monitor.

## 2015-08-03 NOTE — Progress Notes (Signed)
Subjective: [name] is a 74yo M with PMHx of AL amyloidosis, COPD, and HTN admitted for shortness of breath and increased swelling in his legs for the past 1 week. Overnight, he did appear confused, audibly wheezing, and was refusing medications. He did have some improvement when his son came and was able to sleep without other issues after.  Objective: Vital signs in last 24 hours: Filed Vitals:   08/02/15 2329 08/03/15 0637 08/03/15 0807 08/03/15 1141  BP:  153/90  141/92  Pulse: 92 90  88  Temp:  97.7 F (36.5 C)  97.5 F (36.4 C)  TempSrc:  Oral  Oral  Resp:  18  18  Height:      Weight:   166 lb 8 oz (75.524 kg)   SpO2: 100% 100%  100%   Weight change: 6.1 oz (0.173 kg)  Intake/Output Summary (Last 24 hours) at 08/03/15 1249 Last data filed at 08/03/15 1147  Gross per 24 hour  Intake    700 ml  Output   2798 ml  Net  -2098 ml   BP 141/92 mmHg  Pulse 88  Temp(Src) 97.5 F (36.4 C) (Oral)  Resp 18  Ht  (1.803 m)  Wt 166 lb 8 oz (75.524 kg)  BMI 23.23 kg/m2  SpO2 100%  General Appearance:    Alert, cooperative, no distress, appears stated age  Neck:   Supple, symmetrical, trachea midline, no adenopathy;       thyroid:  No enlargement/tenderness/nodules; no carotid   Bruit, JVD noted  Lungs:     Clear to auscultation bilaterally, respirations unlabored. No wheezes, rales, or rhonchi heard  Heart:    Distant heart sounds. Regular rate and rhythm, S1 and S2 normal, S3 heard best at the apex, no murmurs or rubs  Abdomen:     Soft, non-tender, bowel sounds active all four quadrants,    no masses, no organomegaly  Extremities:   Profound pitting edema up the the knees bilaterally.  Pulses:   2+ and symmetric all extremities. Unable to assess DP pulses b/l 2/2 edema. PT pulses 2+ and symmetric.  Skin:   Skin color, texture, turgor normal, no rashes or lesions  Lymph nodes:   Cervical, supraclavicular, and axillary nodes normal  Neurologic:   CNII-XII intact.  Normal strength, sensation and reflexes      throughout   Lab Results: Basic Metabolic Panel:  Recent Labs Lab 08/01/15 1321 08/02/15 0350  NA 143 145  K 4.7 5.4*  CL 114* 116*  CO2 19* 21*  GLUCOSE 93 93  BUN 58* 62*  CREATININE 6.78* 6.62*  CALCIUM 9.1 9.1  PHOS 5.4* 6.1*   Liver Function Tests:  Recent Labs Lab 08/02/15 0350  ALBUMIN 3.0*   CBC:  Studies/Results: Dg Chest 2 View  08/01/2015  CLINICAL DATA:  74 year old male with acute shortness of breath for 1 week. Patient with renal failure. EXAM: CHEST  2 VIEW COMPARISON:  02/08/2013 chest radiograph FINDINGS: Cardiomegaly identified. Small bilateral pleural effusions, right greater than left, noted. Moderate right lower lung atelectasis with possible consolidation and mild left basilar atelectasis noted. Mild pulmonary vascular congestion is present. There is no evidence of pneumothorax. No acute bony abnormalities are present. IMPRESSION: Moderate right lower lung atelectasis with possible consolidation and mild right basilar atelectasis. Small bilateral pleural effusions, right greater than left. Cardiomegaly with mild pulmonary vascular congestion. Electronically Signed   By: Harmon Pier M.D.   On: 08/01/2015 13:54   Dg Chest  Port 1 View  08/03/2015  CLINICAL DATA:  Shortness of breath with cough and congestion EXAM: PORTABLE CHEST 1 VIEW COMPARISON:  August 01, 2015 FINDINGS: The heart is mildly enlarged with pulmonary vascularity within normal limits, stable. There are bilateral pleural effusions with patchy bibasilar consolidation, slightly increased on the left and stable on the right. Consolidation in the right mid lung region is also stable. No adenopathy appreciable. IMPRESSION: Slight increase in consolidation left base. Areas of consolidation on the right are stable. There are bilateral pleural effusions which appear stable. Cardiomegaly is also stable. Electronically Signed   By: Bretta BangWilliam  Woodruff III M.D.    On: 08/03/2015 07:42   Assessment/Plan: Active Problems:   Volume overload 1. Volume overload - Patient presents with 1 week of shortness of breath and increased leg swelling and decreased urine output, with findings of S3 and impressive lower extremity edema on physical exam. Patient is not in respiratory distress currently, satting high 90s on room air and with unlabored breathing. Patient reports he has been compliant with his home lasix dose. CXR in ED showed small bilateral pleural effusions, cardiomegaly with mild pulmonary vascular congestion and atelectatic changes, BNP 2177 with EKG unchaged from previous studies and initial troponins negative. Creatinine is elevated at 6.8, compared to 8 on previous admission 04/2015 and 4 in 2014. His symptoms are most likely from volume overload, likely due to acute exacerbation or progressive renal disease. 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 Last echo 04/2015 showed a normal EF without regional wall abnormalities, making this less likely responsible for his acute presentation. -Nephrology following - patient receiving HD -Continue lasix IV -Follow up BMPs, phos, HD labs (incl. HBV, HIV, TB) -Will decrease amount of vital  2. Enterococcal UTI- patient is without dysuria, but reports decreased urine output over the past 1 week. UA shows many bacteria, +WBC, nitrite negative but few squamous epithelial cells. UCx grew enterococci sensitive to ampicillin. In the setting of confusion, despite no current urinary symptoms, will treat. -S/p 1 dose of ceftriaxone in ED -Currently on amoxicillin syrup 500mg  qhs after dialysis  Dispo: Disposition is deferred at this time, awaiting improvement of current medical problems.  Anticipated discharge in approximately 3-4 day(s).   The patient does have a current PCP (Shane RavelMaura L Hamrick, MD) and [facility] after discharge.  The patient does  not have transportation limitations that hinder transportation to clinic appointments.  LOS: 2 days   Shane HuntsmanWilliam R Oley Lahaie, MD 08/03/2015, 12:49 PM

## 2015-08-03 NOTE — Progress Notes (Signed)
Internal Medicine Attending:   I saw and examined the patient. I reviewed the resident's note and I agree with the resident's findings and plan as documented in the resident's note.  Patient is doing well this morning. Tolerated HD yesterday. We started amoxicillin for enterococcus complicated UTI consistent with sensitivities, I would recommend at least 10 days of treatment. PT today and working to arrange for permanent outpatient HD chair.

## 2015-08-03 NOTE — Progress Notes (Signed)
S:  Became belligerent last night after HD O:BP 153/90 mmHg  Pulse 90  Temp(Src) 97.7 F (36.5 C) (Oral)  Resp 18  Ht  (1.803 m)  Wt 75.524 kg (166 lb 8 oz)  BMI 23.23 kg/m2  SpO2 100%  Intake/Output Summary (Last 24 hours) at 08/03/15 0854 Last data filed at 08/03/15 1610  Gross per 24 hour  Intake    704 ml  Output   2748 ml  Net  -2044 ml   Weight change: 0.173 kg (6.1 oz) RUE:AVWUJ and alert CVS:RRR Resp:Decreased BS bases R>L Abd:+ BS NT ND Ext: 4+ edema, LUA AVF + bruit NEURO:CNI Ox3 mild asterixis   . amLODipine  10 mg Oral Daily  . amoxicillin  500 mg Oral Q12H  . calcium acetate  2,001 mg Oral Q breakfast  . heparin  5,000 Units Subcutaneous 3 times per day  . metoprolol succinate  50 mg Oral Daily  . multivitamin  1 tablet Oral QHS  . tamsulosin  0.4 mg Oral BID   Dg Chest 2 View  08/01/2015  CLINICAL DATA:  74 year old male with acute shortness of breath for 1 week. Patient with renal failure. EXAM: CHEST  2 VIEW COMPARISON:  02/08/2013 chest radiograph FINDINGS: Cardiomegaly identified. Small bilateral pleural effusions, right greater than left, noted. Moderate right lower lung atelectasis with possible consolidation and mild left basilar atelectasis noted. Mild pulmonary vascular congestion is present. There is no evidence of pneumothorax. No acute bony abnormalities are present. IMPRESSION: Moderate right lower lung atelectasis with possible consolidation and mild right basilar atelectasis. Small bilateral pleural effusions, right greater than left. Cardiomegaly with mild pulmonary vascular congestion. Electronically Signed   By: Harmon Pier M.D.   On: 08/01/2015 13:54   Dg Chest Port 1 View  08/03/2015  CLINICAL DATA:  Shortness of breath with cough and congestion EXAM: PORTABLE CHEST 1 VIEW COMPARISON:  August 01, 2015 FINDINGS: The heart is mildly enlarged with pulmonary vascularity within normal limits, stable. There are bilateral pleural effusions  with patchy bibasilar consolidation, slightly increased on the left and stable on the right. Consolidation in the right mid lung region is also stable. No adenopathy appreciable. IMPRESSION: Slight increase in consolidation left base. Areas of consolidation on the right are stable. There are bilateral pleural effusions which appear stable. Cardiomegaly is also stable. Electronically Signed   By: Bretta Bang III M.D.   On: 08/03/2015 07:42   BMET    Component Value Date/Time   NA 145 08/02/2015 0350   K 5.4* 08/02/2015 0350   CL 116* 08/02/2015 0350   CO2 21* 08/02/2015 0350   GLUCOSE 93 08/02/2015 0350   BUN 62* 08/02/2015 0350   CREATININE 6.62* 08/02/2015 0350   CALCIUM 9.1 08/02/2015 0350   GFRNONAA 7* 08/02/2015 0350   GFRAA 8* 08/02/2015 0350   CBC    Component Value Date/Time   WBC 4.6 08/01/2015 1321   WBC 4.7 12/14/2010 0943   RBC 3.87* 08/01/2015 1321   RBC 4.13* 12/14/2010 0943   HGB 11.7* 08/01/2015 1321   HGB 14.0 12/14/2010 0943   HCT 37.1* 08/01/2015 1321   HCT 41.0 12/14/2010 0943   PLT 152 08/01/2015 1321   PLT 220 12/14/2010 0943   MCV 95.9 08/01/2015 1321   MCV 99.4* 12/14/2010 0943   MCH 30.2 08/01/2015 1321   MCH 34.0* 12/14/2010 0943   MCHC 31.5 08/01/2015 1321   MCHC 34.2 12/14/2010 0943   RDW 14.4 08/01/2015 1321   RDW  13.9 12/14/2010 0943   LYMPHSABS 0.4* 08/01/2015 1321   LYMPHSABS 1.0 12/14/2010 0943   MONOABS 0.6 08/01/2015 1321   MONOABS 0.5 12/14/2010 0943   EOSABS 0.2 08/01/2015 1321   EOSABS 0.4 12/14/2010 0943   BASOSABS 0.0 08/01/2015 1321   BASOSABS 0.0 12/14/2010 0943     Assessment:  1. New ESRD sec amyloid 2. Vol overload 3. HTN 4. Sec HPTH 5. Enterococcus UTI  Plan: 1. HD tomorrow and cont to lower DW 2. Start hectorol 3. Awaiting outpt spot 4. Needs PT  Neil Errickson T

## 2015-08-04 DIAGNOSIS — I272 Pulmonary hypertension, unspecified: Secondary | ICD-10-CM | POA: Insufficient documentation

## 2015-08-04 LAB — QUANTIFERON IN TUBE
QFT TB AG MINUS NIL VALUE: <0 IU/mL
QUANTIFERON MITOGEN VALUE: 3.42 [IU]/mL
QUANTIFERON NIL VALUE: 0.05 [IU]/mL
QUANTIFERON TB AG VALUE: 0.03 [IU]/mL
QUANTIFERON TB GOLD: NEGATIVE

## 2015-08-04 LAB — RENAL FUNCTION PANEL
Albumin: 2.9 g/dL — ABNORMAL LOW (ref 3.5–5.0)
Anion gap: 10 (ref 5–15)
BUN: 56 mg/dL — AB (ref 6–20)
CHLORIDE: 104 mmol/L (ref 101–111)
CO2: 24 mmol/L (ref 22–32)
Calcium: 8.6 mg/dL — ABNORMAL LOW (ref 8.9–10.3)
Creatinine, Ser: 6.66 mg/dL — ABNORMAL HIGH (ref 0.61–1.24)
GFR calc Af Amer: 8 mL/min — ABNORMAL LOW (ref >60)
GFR, EST NON AFRICAN AMERICAN: 7 mL/min — AB (ref >60)
Glucose, Bld: 88 mg/dL (ref 65–99)
POTASSIUM: 4.3 mmol/L (ref 3.5–5.1)
Phosphorus: 5.5 mg/dL — ABNORMAL HIGH (ref 2.5–4.6)
Sodium: 138 mmol/L (ref 135–145)

## 2015-08-04 LAB — QUANTIFERON TB GOLD ASSAY (BLOOD)

## 2015-08-04 MED ORDER — DOXERCALCIFEROL 4 MCG/2ML IV SOLN
INTRAVENOUS | Status: AC
  Administered 2015-08-04: 2 ug via INTRAVENOUS
  Filled 2015-08-04: qty 2

## 2015-08-04 NOTE — Progress Notes (Signed)
08/04/2015 3:20 PM Hemodialysis Outpatient planning; I spoke with the patients' son Eldo,who requested a schedule at the [facility]. I have notified the "CLIP" office and forwarded the appropriate paperwork to the center. Thank you. Tilman NeatKrzywonos, Raji Glinski H

## 2015-08-04 NOTE — Procedures (Signed)
Pt seen on HD.  Ap 100 Vp 140  BFR 250.   Will try to pull 3L.  He has [facility].  He will need transportation arranged so doubt he will be able to go prior to weekend.

## 2015-08-04 NOTE — Care Management Note (Signed)
Case Management Note  Patient Details  Name: Shane [name] MRN: [id number] Date of Birth: 1940-10-29  Subjective/Objective:       Admitted with Volume Overload           Action/Plan: Patient lives at home with spouse ( she goes to an Adult Day Care / PACE) his daughter lives close by and assist the patient as needed. [facility] refused all services stated " I don't want that." Patient niece is present at the time of this interview. She stated that she encouraged patient to attend PACE with his spouse but he refused. CM questioned patient about this, he said that he does not like being around people of different Ethnic backgrounds. [place] in [place]. Lupita LeashDonna Soc Worker aware and is working on possibly getting a closer HD site.  Expected Discharge Date:    possibly 08/05/2015              Expected Discharge Plan:  Home/Self Care  Discharge planning Services  CM Consult  [facility]:  [facility]:  Patient Refused  Status of Service:   Inprogress  Reola MosherChandler, Ulysee Fyock L, RN,MHA,BSN [phone] 08/04/2015, 2:21 PM

## 2015-08-04 NOTE — Patient Outreach (Signed)
[facility]) Care Management  08/04/2015  Shane [name] 1940/12/10 [id number]   Referral from Shane ShanksVictoria Brewer, RN to assign Community RN, assigned Shane GaribaldiKimberly Glover, RN.  Thanks, Shane [name], Shane [name] [facility] CM Assistant Phone: [phone] Fax: [phone]

## 2015-08-04 NOTE — Progress Notes (Signed)
TCT patient's son Geralyn Corwinllen Allen stated that he will be in to see the patient and talk to him more about [facility] to [facility] him at [facility]. Patient was active with [facility] and possibly going to [facility].   [facility] is a Program of All-inclusive Care for the Elderly (PACE) that provides community-based services to enrolled individuals who need medical care and support to continue living at home.  Participant Eligibility: Individuals who meet the following requirements are eligible to participate in [facility] [place]: 74 years of age or older  Reside in [place] or [place]  Able to live safely in the community  Meet the [place] [facility] Level of Care criteria How To Make a Referral: You can actually refer yourself! A referral can also be made by a family member, physician or other provider who is familiar with your condition. To make a referral call [phone]. [facility]: [phone]  Abelino DerrickB Morena Mckissack [facility] HospitalRN,MHA,BSN [phone]

## 2015-08-04 NOTE — Progress Notes (Signed)
CSW referred to assist patient with transportation to [facility]- however [place]. CSW discussed with Darel HongJudy- Dialysis Coordiator who spoke with patient's son Rennis Hardingdward, JR and dialysis facility changed to the [facility]. She is awaiting facility to determine weekday schedule.  CSW spoke multiple times with Dr. Reubin MilanBilly Kennedy re: above as well as questions about PACE services. Unit RNCM- Jiles CrockerBrenda Chandler spoke to patient about PACE and he declines interest in services at this time.  His wife is a PACE patient.  Family will assist patient with transportation to dialysis services once this has been arranged.  The change in facilities will allow easier access of family to provide transportation for patient.  No further CSW needs identified at this time.  CSW will sign off.  Lorri Frederickonna T. Jaci LazierCrowder, Kentucky[place]CSW [phone]

## 2015-08-04 NOTE — Progress Notes (Signed)
Internal Medicine Attending:   I saw and examined the patient. I reviewed the resident's note and I agree with the resident's findings and plan as documented in the resident's note.  Patient is doing well today, no complaints. He ambulated well with PT yesterday. Tolerating HD well so far. He has [facility]. We will work with nephrology to transition him from inpatient to the outpatient spot over the next few days. Continuing amoxicillin daily, dosed after HD.

## 2015-08-04 NOTE — Progress Notes (Signed)
Subjective: Shane [name] is a 74yo M with PMHx of AL amyloidosis, COPD, and HTN admitted for shortness of breath and increased swelling in his legs for the past 1 week. Overnight, he had no issues or acute events. He has been tolerating HD well, says he has no issues breathing on room air. His leg swelling is stable from yesterday.  Objective: Vital signs in last 24 hours: Filed Vitals:   08/03/15 1141 08/03/15 1536 08/03/15 2018 08/04/15 0417  BP: 141/92 125/74 152/86 145/94  Pulse: 88 72 67 74  Temp: 97.5 F (36.4 C) 97.4 F (36.3 C) 97.6 F (36.4 C) 97.6 F (36.4 C)  TempSrc: Oral Oral Oral Oral  Resp: Height:      Weight:    165 lb 6.4 oz (75.025 kg)  SpO2: 100% 100% 93% 94%   Weight change: -6 lb 12.5 oz (-3.076 kg)  Intake/Output Summary (Last 24 hours) at 08/04/15 0935 Last data filed at 08/04/15 0132  Gross per 24 hour  Intake    462 ml  Output     75 ml  Net    387 ml   BP 145/94 mmHg  Pulse 74  Temp(Src) 97.6 F (36.4 C) (Oral)  Resp 20  Ht  (1.803 m)  Wt 165 lb 6.4 oz (75.025 kg)  BMI 23.08 kg/m2  SpO2 94%  General Appearance:    Alert, cooperative, no distress, appears stated age  Neck:   Supple, symmetrical, trachea midline, no adenopathy;       thyroid:  No enlargement/tenderness/nodules; no carotid   Bruit, JVD noted  Lungs:     Clear to auscultation bilaterally, respirations unlabored. No wheezes, rales, or rhonchi heard  Heart:    Distant heart sounds. Regular rate and rhythm, S1 and S2 normal, S3 heard best at the apex, no murmurs or rubs  Abdomen:     Soft, non-tender, bowel sounds active all four quadrants,    no masses, no organomegaly  Extremities:   Profound pitting edema up the the knees bilaterally.  Pulses:   2+ and symmetric all extremities. Unable to assess DP pulses b/l 2/2 edema. PT pulses 2+ and symmetric.  Skin:   Skin color, texture, turgor normal, no rashes or lesions  Lymph nodes:   Cervical, supraclavicular,  and axillary nodes normal  Neurologic:   CNII-XII intact. Normal strength, sensation and reflexes      throughout   Lab Results: Basic Metabolic Panel:  Recent Labs Lab 08/02/15 0350 08/04/15 0749  NA 145 138  K 5.4* 4.3  CL 116* 104  CO2 21* 24  GLUCOSE 93 88  BUN 62* 56*  CREATININE 6.62* 6.66*  CALCIUM 9.1 8.6*  PHOS 6.1* 5.5*   Liver Function Tests:  Recent Labs Lab 08/02/15 0350 08/04/15 0749  ALBUMIN 3.0* 2.9*   CBC:  Studies/Results: Dg Chest Port 1 View  08/03/2015  CLINICAL DATA:  Shortness of breath with cough and congestion EXAM: PORTABLE CHEST 1 VIEW COMPARISON:  August 01, 2015 FINDINGS: The heart is mildly enlarged with pulmonary vascularity within normal limits, stable. There are bilateral pleural effusions with patchy bibasilar consolidation, slightly increased on the left and stable on the right. Consolidation in the right mid lung region is also stable. No adenopathy appreciable. IMPRESSION: Slight increase in consolidation left base. Areas of consolidation on the right are stable. There are bilateral pleural effusions which appear stable. Cardiomegaly is also stable. Electronically Signed   By: Bretta Bang  III M.D.   On: 08/03/2015 07:42   Assessment/Plan: Principal Problem:   Volume overload Active Problems:   Amyloidosis (HCC)   Essential hypertension   Urinary tract infection due to Enterococcus 1. Volume overload - Patient presents with 1 week of shortness of breath and increased leg swelling and decreased urine output, with findings of S3 and impressive lower extremity edema on physical exam. Patient is not in respiratory distress currently, satting high 90s on room air and with unlabored breathing. Patient reports he has been compliant with his home lasix dose. CXR in ED showed small bilateral pleural effusions, cardiomegaly with mild pulmonary vascular congestion and atelectatic changes, BNP 2177 with EKG unchaged from previous studies and  initial troponins negative. Creatinine is elevated at 6.8, compared to 8 on previous admission 04/2015 and 4 in 2014. His symptoms are most likely from volume overload, likely due to acute exacerbation or progressive renal disease. 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 Last echo 04/2015 showed a normal EF without regional wall abnormalities, making this less likely responsible for his acute presentation. -Nephrology following - patient receiving HD -Will follow up with social work today regarding outpatient HD chair placement status -Follow up BMPs, phos, HD labs (incl. HBV, HIV, TB)  2. Enterococcal UTI- patient is without dysuria, but reports decreased urine output over the past 1 week. UA shows many bacteria, +WBC, nitrite negative but few squamous epithelial cells. UCx grew enterococci sensitive to ampicillin. In the setting of confusion, despite no current urinary symptoms, will treat. -S/p 1 dose of ceftriaxone in ED -Currently on amoxicillin syrup 500mg  qhs after dialysis  Dispo: Disposition is deferred at this time, awaiting improvement of current medical problems.  Anticipated discharge in approximately 1-2 day(s).   The patient does have a current PCP (Shane RavelMaura L Hamrick, MD) and [facility] after discharge.  The patient does not have transportation limitations that hinder transportation to clinic appointments.  LOS: 3 days   Shane HuntsmanWilliam R Darrio Bade, MD 08/04/2015, 9:35 AM

## 2015-08-04 NOTE — Consult Note (Signed)
   [facility] CM Inpatient Consult   08/04/2015  Shane [name] 05/06/1941 [id number] Referral received. Patient evaluated for community based chronic disease management services with [facility] as a benefit of [facility]'s [facility]. Spoke with patient at bedside to explain [facility] Care [facility] services. Consent form signed.   Patient will receive post discharge transition of care call and will be evaluated for monthly home visits for assessments and disease process education.  Left contact information and [facility] literature at bedside. Made Inpatient Case Manager aware that [facility] Care Management following. Of note, [facility] Care Management services does not replace or interfere with any services that are arranged by inpatient case management or social work.  For additional questions or referrals please contact:   Charlesetta ShanksVictoria Malavika Lira, RN BSN CCM [facility] Liaison  [phone] business mobile phone

## 2015-08-05 LAB — RENAL FUNCTION PANEL
ALBUMIN: 2.8 g/dL — AB (ref 3.5–5.0)
ANION GAP: 11 (ref 5–15)
BUN: 46 mg/dL — ABNORMAL HIGH (ref 6–20)
CALCIUM: 8.5 mg/dL — AB (ref 8.9–10.3)
CO2: 22 mmol/L (ref 22–32)
Chloride: 103 mmol/L (ref 101–111)
Creatinine, Ser: 5.81 mg/dL — ABNORMAL HIGH (ref 0.61–1.24)
GFR, EST AFRICAN AMERICAN: 10 mL/min — AB (ref >60)
GFR, EST NON AFRICAN AMERICAN: 9 mL/min — AB (ref >60)
Glucose, Bld: 144 mg/dL — ABNORMAL HIGH (ref 65–99)
PHOSPHORUS: 4.8 mg/dL — AB (ref 2.5–4.6)
POTASSIUM: 3.8 mmol/L (ref 3.5–5.1)
SODIUM: 136 mmol/L (ref 135–145)

## 2015-08-05 LAB — CBC
HEMATOCRIT: 33.3 % — AB (ref 39.0–52.0)
HEMOGLOBIN: 11 g/dL — AB (ref 13.0–17.0)
MCH: 31.3 pg (ref 26.0–34.0)
MCHC: 33 g/dL (ref 30.0–36.0)
MCV: 94.6 fL (ref 78.0–100.0)
Platelets: 102 10*3/uL — ABNORMAL LOW (ref 150–400)
RBC: 3.52 MIL/uL — ABNORMAL LOW (ref 4.22–5.81)
RDW: 14.7 % (ref 11.5–15.5)
WBC: 4.6 10*3/uL (ref 4.0–10.5)

## 2015-08-05 MED ORDER — HEPARIN SODIUM (PORCINE) 1000 UNIT/ML DIALYSIS
3000.0000 [IU] | INTRAMUSCULAR | Status: DC | PRN
  Filled 2015-08-05: qty 3

## 2015-08-05 MED ORDER — SODIUM CHLORIDE 0.9 % IV SOLN: 100.0000 mL | INTRAVENOUS | Status: DC | PRN

## 2015-08-05 MED ORDER — PENTAFLUOROPROP-TETRAFLUOROETH EX AERO: 1.0000 "application " | INHALATION_SPRAY | CUTANEOUS | Status: DC | PRN

## 2015-08-05 MED ORDER — HEPARIN SODIUM (PORCINE) 1000 UNIT/ML DIALYSIS
1000.0000 [IU] | INTRAMUSCULAR | Status: DC | PRN
  Filled 2015-08-05: qty 1

## 2015-08-05 MED ORDER — CALCIUM ACETATE (PHOS BINDER) 667 MG PO CAPS: 1334.0000 mg | ORAL_CAPSULE | Freq: Every day | ORAL | Status: DC

## 2015-08-05 MED ORDER — LIDOCAINE HCL (PF) 1 % IJ SOLN: 5.0000 mL | INTRAMUSCULAR | Status: DC | PRN

## 2015-08-05 MED ORDER — LIDOCAINE-PRILOCAINE 2.5-2.5 % EX CREA: 1.0000 "application " | TOPICAL_CREAM | CUTANEOUS | Status: DC | PRN

## 2015-08-05 MED ORDER — ALTEPLASE 2 MG IJ SOLR
2.0000 mg | Freq: Once | INTRAMUSCULAR | Status: DC | PRN
  Filled 2015-08-05: qty 2

## 2015-08-05 MED ORDER — AMLODIPINE BESYLATE 5 MG PO TABS
5.0000 mg | ORAL_TABLET | Freq: Every day | ORAL | Status: DC
  Administered 2015-08-05: 5 mg via ORAL
  Filled 2015-08-05: qty 1

## 2015-08-05 MED ORDER — AMOXICILLIN 250 MG/5ML PO SUSR: 500.0000 mg | Freq: Every day | ORAL | Status: AC

## 2015-08-05 NOTE — Care Management Note (Signed)
Case Management Note  Patient Details  Name: Shane [name] MRN: [id number] Date of Birth: 26-Apr-1941  Subjective/Objective:   Admitted 06/01/2015 with Pt Dialyzed today.                  Action/Plan:Awaiting final Outpatient HD appointment time to be scheduled By Velna HatchetSheila [phone] ( Hemodialysis Outpatient Coordinator)  M[facility]-F [facility]. This should be communicated to Internist pt is in CLIPPING process.    Expected Discharge Date:                  Expected Discharge Plan:  Home/Self Care  In-House Referral:     Discharge planning Services  CM Consult  Post Acute Care Choice:    Choice offered to:  Patient  DME Arranged:    DME Agency:     HH Arranged:  Patient Refused HH Agency:     Status of Service:     Medicare Important Message Given:    Date Medicare IM Given:    Medicare IM give by:    Date Additional Medicare IM Given:    Additional Medicare Important Message give by:     If discussed at Long Length of Stay Meetings, dates discussed:    Additional Comments:  Yvone NeuCrutchfield, Cedrik Heindl M, RN 08/05/2015, 3:00 PM

## 2015-08-05 NOTE — Progress Notes (Signed)
Internal Medicine Attending:   I saw and examined the patient. I reviewed the resident's note and I agree with the resident's findings and plan as documented in the resident's note.  [name] is doing well today with no complaints. Tolerated his third HD session well. Volume status looks improved. We are working on finalizing his outpatient chair. He likely cannot intake to the HD center on Saturday, so we will have to coordinate either intake tomorrow, or keep him for inpatient HD Saturday and intake on Tuesday. Potential to dc either today or Saturday depending on this HD schedule.

## 2015-08-05 NOTE — Progress Notes (Signed)
At [facility]

## 2015-08-05 NOTE — Progress Notes (Signed)
Subjective: [name] is a 74yo M with PMHx of AL amyloidosis, COPD, and HTN admitted for shortness of breath and increased swelling in his legs for the past 1 week. Overnight, he had no issues or acute events. He has been tolerating HD well. We saw him in HD this morning. He has had no shortness of breath for the past 2 days and his leg swelling is stable from yesterday.  Objective: Vital signs in last 24 hours: Filed Vitals:   08/05/15 0830 08/05/15 0900 08/05/15 0930 08/05/15 1000  BP: 125/71 130/78 137/76 130/66  Pulse: 76 75 76 74  Temp:      TempSrc:      Resp: 18 20 18 18   Height:      Weight:      SpO2:       Weight change: 6.2 oz (0.176 kg)  Intake/Output Summary (Last 24 hours) at 08/05/15 1111 Last data filed at 08/04/15 1700  Gross per 24 hour  Intake    240 ml  Output   3000 ml  Net  -2760 ml   BP 130/66 mmHg  Pulse 74  Temp(Src) 97.5 F (36.4 C) (Oral)  Resp 18  Ht 5\' 11"  (1.803 m)  Wt 159 lb 13.3 oz (72.5 kg)  BMI 22.30 kg/m2  SpO2 100%  General Appearance:    Alert, cooperative, no distress, appears stated age  Neck:   Supple, symmetrical, trachea midline, no adenopathy;       thyroid:  No enlargement/tenderness/nodules; no carotid   Bruit.  Lungs:     Clear to auscultation bilaterally, respirations unlabored. No wheezes, rales, or rhonchi heard  Heart:    Regular rate and rhythm, S1 and S2 normal, S3 heard best at the apex, no murmurs or rubs  Abdomen:     Soft, non-tender, bowel sounds active all four quadrants,    no masses, no organomegaly  Extremities:   Profound pitting edema up the the knees bilaterally.  Pulses:   2+ and symmetric all extremities. Unable to assess DP pulses b/l 2/2 edema. PT pulses 2+ and symmetric.  Skin:   Skin color, texture, turgor normal, no rashes or lesions  Lymph nodes:   Cervical, supraclavicular, and axillary nodes normal  Neurologic:   CNII-XII intact. Normal strength, sensation and reflexes      throughout    Lab Results: Basic Metabolic Panel:  Recent Labs Lab 08/04/15 0749 08/05/15 0806  NA 138 136  K 4.3 3.8  CL 104 103  CO2 24 22  GLUCOSE 88 144*  BUN 56* 46*  CREATININE 6.66* 5.81*  CALCIUM 8.6* 8.5*  PHOS 5.5* 4.8*   Liver Function Tests:  Recent Labs Lab 08/04/15 0749 08/05/15 0806  ALBUMIN 2.9* 2.8*   Assessment/Plan: Principal Problem:   Volume overload Active Problems:   Amyloidosis (HCC)   Essential hypertension   Urinary tract infection due to Enterococcus 1. Volume overload - Patient presents with 1 week of shortness of breath and increased leg swelling and decreased urine output, with findings of S3 and impressive lower extremity edema on physical exam. Patient is not in respiratory distress currently, satting high 90s on room air and with unlabored breathing. Patient reports he has been compliant with his home lasix dose. CXR in ED showed small bilateral pleural effusions, cardiomegaly with mild pulmonary vascular congestion and atelectatic changes, BNP 2177 with EKG unchaged from previous studies and initial troponins negative. Creatinine is elevated at 6.8, compared to 8 on previous admission 04/2015 and  4 in 2014. His symptoms are most likely from volume overload, likely due to acute exacerbation or progressive renal disease. 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 Last echo 04/2015 showed a normal EF without regional wall abnormalities, making this less likely responsible for his acute presentation. -Nephrology following - patient receiving HD. Today is his last day of inpatient dialysis.  -Been following with social work and family regarding HD chair - patient initially placed at [facility] dialysis TTS, but was able to switch to [facility] as this is much closer to home. Will follow with social work regarding patient's new schedule there. -Follow up BMPs, phos, HD labs (incl. HBV, HIV, TB)  2. Enterococcal UTI-  patient is without dysuria, but reports decreased urine output over the past 1 week. UA shows many bacteria, +WBC, nitrite negative but few squamous epithelial cells. UCx grew enterococci sensitive to ampicillin. In the setting of confusion, despite no current urinary symptoms, will treat. -S/p 1 dose of ceftriaxone in ED -Currently on amoxicillin syrup  qhs after dialysis, day 5/10. Will require 10 day course for complicated UTI to be completed on Tuesday 08/10/15.  Dispo: Disposition is deferred at this time, awaiting improvement of current medical problems.  Anticipated discharge in approximately 1-2 day(s).   The patient does have a current PCP ([name], MD) and does need an [facility] hospital follow-up appointment after discharge.  The patient does not have transportation limitations that hinder transportation to clinic appointments.  LOS: 4 days   Darrick Huntsman, MD 08/05/2015, 11:11 AM

## 2015-08-05 NOTE — Progress Notes (Signed)
Physical Therapy Treatment Patient Details Name: Shane [name] L Brents MRN: [id number] DOB: 01-31-1941 Today's Date: 08/05/2015    History of Present Illness Patient is a 74 y/o M with PMH of AL amyloidosis and approaching end-stage renal disease, and HTN.  Pt presents with increased LE swelling and SOB.    PT Comments    Pt progress continues to be limited by pt participation with therapy.  Pt with improved stability with ambulation using RW vs SPC. He is able to ambulate with min guard but demonstrates poor safety awareness and is unreceptive to cues.  Pt will benefit from further acute skilled PT services to maximize safety and independence with functional mobility.  PT continues to recommend HHPT and 24 hour supervision upon discharge.   Follow Up Recommendations  Home health PT;Supervision/Assistance - 24 hour;Supervision for mobility/OOB     Equipment Recommendations  Rolling walker with 5" wheels    Recommendations for Other Services       Precautions / Restrictions Precautions Precautions: Fall Restrictions Weight Bearing Restrictions: No    Mobility  Bed Mobility Overal bed mobility: Needs Assistance Bed Mobility: Supine to Sit     Supine to sit: Supervision        Transfers   Equipment used: Rolling walker (2 wheeled) Transfers: Sit to/from Stand Sit to Stand: Min guard         General transfer comment: VCs for hand placement. Pt again with poor safety with stand to sit, requiring VCs to back up to the chair prior to sitting.  Ambulation/Gait Ambulation/Gait assistance: Min guard Ambulation Distance (Feet): 75 Feet Assistive device: Rolling walker (2 wheeled) Gait Pattern/deviations: Step-through pattern;Decreased stride length;Drifts right/left Gait velocity: slower Gait velocity interpretation: Below normal speed for age/gender General Gait Details: Pt more stable with RW than SPC but continues to demonstrate poor safety awareness.  No LOB noted but  continues to be unsteady at times especially during turning.  No increased dyspnea.  Pt limited by lack of motivation to continue walking.   Stairs            Wheelchair Mobility    Modified Rankin (Stroke Patients Only)       Balance     Sitting balance-Leahy Scale: Fair       Standing balance-Leahy Scale: Fair                      Cognition Arousal/Alertness: Awake/alert Behavior During Therapy: Agitated Overall Cognitive Status: No family/caregiver present to determine baseline cognitive functioning                 General Comments: Pt is agitated and demonstrates limited motivation to participate with therapy.    Exercises      General Comments General comments (skin integrity, edema, etc.): Pt limited by agitation and limited participation.  He demonstrates poor safety, impulsivity, and is unreceptive to cues.      Pertinent Vitals/Pain Pain Assessment: No/denies pain    Home Living                      Prior Function            PT Goals (current goals can now be found in the care plan section) Acute Rehab PT Goals Patient Stated Goal: none stated PT Goal Formulation: With patient Time For Goal Achievement: 08/17/15 Potential to Achieve Goals: Good Progress towards PT goals: Progressing toward goals    Frequency  Min 3X/week  PT Plan Current plan remains appropriate    Co-evaluation             End of Session Equipment Utilized During Treatment: Gait belt Activity Tolerance: Treatment limited secondary to agitation Patient left: in chair;with nursing/sitter in room;Other (comment) (pt refusing chair alarm)     Time: 1610-9604 PT Time Calculation (min) (ACUTE ONLY): 8 min  Charges:  $Gait Training: 8-22 mins                    G Codes:      Jehad Bisono 2015-08-14, 2:47 PM  Arnoldo Morale, SPT

## 2015-08-05 NOTE — Progress Notes (Signed)
Pt refusing chair and bed alarm after PT worked with him.  Will continue to monitor.

## 2015-08-05 NOTE — Procedures (Signed)
Pt seen on HD.  [facility] so awaiting time for him at that unit. Ap 120 VP 120  BFR 250.  On 4K bath.  Will empirically decrease amlodipine to 5mg  so will allow better BP to pull fluid.

## 2015-08-05 NOTE — Progress Notes (Signed)
PT Cancellation Note  Patient Details Name: Shane [name] MRN: [id number] DOB: 11/26/1940   Cancelled Treatment:    Reason Eval/Treat Not Completed: Patient at procedure or test/unavailable, at HD     Fabio AsaWerner, Ladonne Sharples J 08/05/2015, 10:48 AM Charlotte Crumbevon Madasyn Heath, PT DPT  [phone]

## 2015-08-05 NOTE — Progress Notes (Signed)
Date and Time :Mon .Oct 31 at 11am patient can start Saturday Oct 30 be there at 11am

## 2015-08-05 NOTE — Discharge Instructions (Signed)
Mr Shane [name],  We have scheduled appointments for your dialysis at [facility], with your first session being this Saturday, Oct 29th at 11am and then again on Monday, Oct 31st at 11am. From there on out, you will be scheduled for dialysis each Monday, Wednesday, and Friday there. I'm happy you're feeling better. Thanks for letting us help take care of you.  Reubin MilanBilly [name]

## 2015-08-06 ENCOUNTER — Other Ambulatory Visit: Payer: Self-pay | Admitting: *Deleted

## 2015-08-06 NOTE — Patient Outreach (Signed)
[facility]) Care Management  08/06/2015  Shane [name] 02/25/1941 [id number]   Transition of care   1445 Second attempt at trying to reach [name] of transition of care call, unsuccessful no answer and unable to leave a message.  I have also attempted to call [name] also listed on [facility] consent, voice mail full unable to leave a message.  1500 Placed call to Shane StallAllen [name] also listed on [facility] consent in an afford to reach  out to [name] HIPPA identifying information obtained . Shane [name] states that he just finished talking to him and would give him, [name] my  phone number to call back, I also verified that I was calling the correct number [phone].  1600 No return call  yet from [name], unsuccessful at reaching him by phone again,  Plan: Will attempt to contact patient on Monday, October 31.  Egbert GaribaldiKimberly Dayvin Aber, RN, [facility] Care Management [phone]- Mobile [phone] Toll Free Main Office

## 2015-08-06 NOTE — Patient Outreach (Addendum)
[facility](THN) Care Management  08/06/2015  Jeanelle Mallingdward L Espy 09-22-1941 [id number]  Transition of Care Mr.,Kazmi [facility], and to begin HD on Saturday, October 29 at 11:00 [facility] per discharge instructions, and then began a regular M-W-F schedule 10/31. Outgoing call to Mr.[place] unsuccessful I dialed telephone numbers listed in EPIC pt number [phone], and [phone], no voicemail so unable to leave a message.  I also tried telephone number listed on [facility] for Viviann Sparedward Desjardin JR [phone], no answer and voicemail not set up.   I will try again later this afternoon.   Egbert GaribaldiKimberly Glover, RN, [facility] Care Management [phone]- Mobile [phone]- Toll Free Main Office

## 2015-08-07 DIAGNOSIS — D689 Coagulation defect, unspecified: Secondary | ICD-10-CM | POA: Diagnosis not present

## 2015-08-07 DIAGNOSIS — D509 Iron deficiency anemia, unspecified: Secondary | ICD-10-CM | POA: Diagnosis not present

## 2015-08-07 DIAGNOSIS — N2581 Secondary hyperparathyroidism of renal origin: Secondary | ICD-10-CM | POA: Diagnosis not present

## 2015-08-07 DIAGNOSIS — Z111 Encounter for screening for respiratory tuberculosis: Secondary | ICD-10-CM | POA: Diagnosis not present

## 2015-08-07 DIAGNOSIS — N186 End stage renal disease: Secondary | ICD-10-CM | POA: Diagnosis not present

## 2015-08-07 NOTE — Discharge Summary (Signed)
Name: Shane [name] MRN: [id number] DOB: October 16, 1940 74 y.o. PCP: [name], MD  Date of Admission: 08/01/2015 12:56 PM Date of Discharge: 08/07/2015 Attending Physician: No att. providers found  Discharge Diagnosis:  1. 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     amLODipine 10 MG tablet  Commonly known as:  NORVASC  Take 1 tablet (10 mg total) by mouth daily.     amoxicillin 250 MG/5ML suspension  Commonly known as:  AMOXIL  Take 10 mLs (500 mg total) by mouth at bedtime.     calcium acetate 667 MG capsule  Commonly known as:  PHOSLO  Take 2,001 mg by mouth daily.     camphor-menthol lotion  Commonly known as:  SARNA  Apply topically as needed for itching.     diphenhydrAMINE 25 mg capsule  Commonly known as:  BENADRYL  Take 1 capsule (25 mg total) by mouth every 6 (six) hours as needed for itching.     FLOMAX 0.4 MG Caps capsule  Generic drug:  tamsulosin  Take 0.4 mg by mouth 2 (two) times daily.     furosemide 40 MG tablet  Commonly known as:  LASIX  Take 1 tablet (40 mg total) by mouth daily.     metoprolol succinate 50 MG 24 hr tablet  Commonly known as:  TOPROL-XL  Take 50 mg by mouth daily. Take with or immediately following a meal.     multivitamin with minerals Tabs tablet  Take 1 tablet by mouth daily.        Disposition and follow-up:   ShaneColt L [name] was discharged from [facility] in Good condition.  At the hospital follow up visit please address:  1.  Volume status, respiratory symptoms, HD sessions  2.  Labs / imaging needed at time of  follow-up: None  3.  Pending labs/ test needing follow-up: None  Follow-up Appointments:     Follow-up Information    Follow up with [facility], [facility] On 08/13/2015.   Specialty:  Family Medicine   Why:  Appt is at 10:00 am   Contact information:   Dr. Burnell Blanks [address] [place] [address] [phone]       Follow up with [facility] [place]. Go on 08/08/2015.   Why:  Saturday at 11 am; Then you will start M-W-F schedule on Monday 08/09/2015 @ 1100 am    Contact information:   Dialysis Center [address] [phone]      Discharge Instructions: Discharge Instructions    AMB Referral to [facility] Care Management    Complete by:  As directed   Reason for consult:  MD referral - CKD post hospital community care management follow up  Diagnoses of:  Kidney Failure  Expected date of contact:  1-3 days (reserved for hospital discharges)  Please assign to community nurse for transition of care calls and assess for home visits. Consent form signed.  New dialysis patient.  Patient lives in [place] per MD notes. Questions please call:  Charlesetta Shanks, RN BSN CCM [facility]  Liaison  [phone] business mobile phone     Diet - low sodium heart healthy    Complete by:  As directed      Increase activity slowly    Complete by:  As directed            Consultations: Treatment Team:  Primitivo GauzeMichael Mattingly, MD  Procedures Performed:  Dg Chest 2 View  08/01/2015  CLINICAL DATA:  74 year old male with acute shortness of breath for 1 week. Patient with renal failure. EXAM: CHEST  2 VIEW COMPARISON:  02/08/2013 chest radiograph FINDINGS: Cardiomegaly identified. Small bilateral pleural effusions, right greater than left, noted. Moderate right lower lung atelectasis with possible consolidation and mild left basilar atelectasis noted. Mild pulmonary vascular congestion is present. There is no evidence of pneumothorax. No acute  bony abnormalities are present. IMPRESSION: Moderate right lower lung atelectasis with possible consolidation and mild right basilar atelectasis. Small bilateral pleural effusions, right greater than left. Cardiomegaly with mild pulmonary vascular congestion. Electronically Signed   By: Harmon PierJeffrey  Hu M.D.   On: 08/01/2015 13:54   Dg Chest Port 1 View  08/03/2015  CLINICAL DATA:  Shortness of breath with cough and congestion EXAM: PORTABLE CHEST 1 VIEW COMPARISON:  August 01, 2015 FINDINGS: The heart is mildly enlarged with pulmonary vascularity within normal limits, stable. There are bilateral pleural effusions with patchy bibasilar consolidation, slightly increased on the left and stable on the right. Consolidation in the right mid lung region is also stable. No adenopathy appreciable. IMPRESSION: Slight increase in consolidation left base. Areas of consolidation on the right are stable. There are bilateral pleural effusions which appear stable. Cardiomegaly is also stable. Electronically Signed   By: Bretta BangWilliam  Woodruff III M.D.   On: 08/03/2015 07:42   Admission HPI: [name] is a 74yo M with PMH AL amyloidosis, COPD, and HTN who presents with shortness of breath and increased swelling in his legs for the past 1 week. He says that he gets short of breath even when simply talking, let alone with activity, that has been going on for the past week but not getting particularly worse over this time. He tried using his PRN albuterol inhaler, but this has not given him much relief. He usually is able to ambulate without difficulty and perform his ADLs without getting short of breath. He says he has had increased swelling in his legs over this time as well and difficulty making urine but otherwise has no symptoms. He denies fevers, lightheadedness, diaphoresis, chest pain, palpitations, cough, abdominal pain, nausea, or changes in bowel function. He says he has been compliant with all of his medicines, including  lasix. He is a current 1/2 ppd smoker, for approximately 50 years. He denies alcohol use or any other recreational drug use. He denies any sick contacts or recent travel.  Of note, patient was admitted in 04/2015 for acute on chronic kidney disease and acute encephalopathy, possibly uremic in origin. He had a LUE AV fistula placed on 04/08/2015, which has not been used yet. He has never received dialysis.  Hospital Course by problem list: Principal Problem:   Volume overload Active Problems:   Amyloidosis (HCC)   Essential hypertension   Urinary tract infection due to Enterococcus   1. Volume overload 2/2 amyloid ESRD - Patient presented with 1 week of shortness of breath and increased leg swelling and decreased urine output, with findings of S3 and impressive lower extremity edema on physical exam. Patient was not in respiratory distress on admission,  satting high 90s on room air and with unlabored breathing. Patient reports he has been compliant with his home lasix dose. CXR in ED showed small bilateral pleural effusions, cardiomegaly with mild pulmonary vascular congestion and atelectatic changes, BNP 2177 with EKG unchaged from previous studies and initial troponins negative. Creatinine was elevated at 6.8, compared to 8 on previous admission 04/2015 and 4 in 2014. Nephrology was consulted for initiation of HD. Vascular deemed that his RUE AV fistula was mature enough for use, and the patient received HD inpatient for 4 days before being discharged upon getting placement at [facility] on MWF schedule with one session on Saturday, 10/29 before starting MWF.   2. Enterococcal UTI- patient was without dysuria, but reported decreased urine output over the past 1 week. UA showed many bacteria, +WBC, nitrite negative but few squamous epithelial cells. UCx grew enterococci sensitive to ampicillin. In the setting of confusion, despite no current urinary symptoms, we did treat him for a  complicated UTI. He received 1 dose of ceftriaxone in the ED and then was treated with amoxicillin syrup  phs after dialysis for 10 days total to be completed on Tuesday 08/10/15.  Discharge Vitals:   BP 139/66 mmHg  Pulse 81  Temp(Src) 97.5 F (36.4 C) (Oral)  Resp 18  Ht  (1.803 m)  Wt 153 lb 14.1 oz (69.8 kg)  BMI 21.47 kg/m2  SpO2 100%  Discharge Labs:  No results found for this or any previous visit (from the past 24 hour(s)).  Signed: Darrick Huntsman, MD 08/07/2015, 9:40 AM

## 2015-08-09 DIAGNOSIS — D689 Coagulation defect, unspecified: Secondary | ICD-10-CM | POA: Diagnosis not present

## 2015-08-09 DIAGNOSIS — D509 Iron deficiency anemia, unspecified: Secondary | ICD-10-CM | POA: Diagnosis not present

## 2015-08-09 DIAGNOSIS — N2581 Secondary hyperparathyroidism of renal origin: Secondary | ICD-10-CM | POA: Diagnosis not present

## 2015-08-09 DIAGNOSIS — Z111 Encounter for screening for respiratory tuberculosis: Secondary | ICD-10-CM | POA: Diagnosis not present

## 2015-08-09 DIAGNOSIS — N186 End stage renal disease: Secondary | ICD-10-CM | POA: Diagnosis not present

## 2015-08-10 ENCOUNTER — Other Ambulatory Visit: Payer: Self-pay | Admitting: *Deleted

## 2015-08-10 NOTE — Patient Outreach (Signed)
[facility]N) Care Management  08/10/2015  Jeanelle Mallingdward L Trivedi October 31, 1940 [id number]  Initial successful transition of care call.  Subjective: [name], patient reports that he is doing well since being at home. He has Hem[facility] Monday, [facility] and [facility], he has a family member that is able to provide transportation to appointments. Mr.Sawka reports that he is taking his medication as prescribed at discharge..Patient reports tolerating meals without problems and denies difficulty with ambulating around his home. Reminded Mr.Stiggers of importance of attending post hospital visit with primary provider. Noted on discharge instructions his appointment with PCP is scheduled on Friday, November 4 at 11:00, during the time patient will be at Hemodialysis . I asked patient if he wanted me to assist with changing his appointment date he agreed. He requested a 8 am appointment time, no appointment available at that time slot per Dr.Hamrick office. Dr.Hamrick office appointment changed to Thursday at 1115, Mr.Rison agreed with time and states that he will have transportation to appointment. Patient denies any other problems or concerns   Plan: Discussed the importance and Encouraged patient to attend post hospital doctor visit. Explained the Transition of care call and program Schedule follow up telephone call for next week Provide patient with my contact information I will send Dr.Hamrick a letter of [facility]  involvement [facility] CM Care Plan Problem One        Most Recent Value   Care Plan Problem One  Recent Hospital Admission    Role Documenting the Problem One  Care Management Coordinator   Care Plan for Problem One  Active   THN Long Term Goal (31-90 days)  Patient will not experience a hospital admission in next 31 days   THN Long Term Goal Start Date  08/10/15   Interventions for Problem One Long Term  Goal  Reviewed importance of notifying MD of health concerns, Take medications as prescribed ,Discussed importance of timely follow up with primary care  provider , encouraged  to attend each dialysis session   [facility] CM Short Term Goal #1 (0-30 days)  Patient will attend primary care provider appointment within 7 days of discharge   [facility] CM Short Term Goal #1 Start Date  08/10/15   Interventions for Short Term Goal #1  call to Dr.Hamrick office to reschedule post hospital appointment, to a day that Hemodialysis is not scheduled  per patient request and encouraged patient to attend.     Egbert GaribaldiKimberly Glover, RN, [facility] Care Management [phone] Mobile [phone]- Toll Free Main Office

## 2015-08-11 ENCOUNTER — Encounter: Payer: Self-pay | Admitting: *Deleted

## 2015-08-11 DIAGNOSIS — D631 Anemia in chronic kidney disease: Secondary | ICD-10-CM | POA: Diagnosis not present

## 2015-08-11 DIAGNOSIS — N2581 Secondary hyperparathyroidism of renal origin: Secondary | ICD-10-CM | POA: Diagnosis not present

## 2015-08-11 DIAGNOSIS — N186 End stage renal disease: Secondary | ICD-10-CM | POA: Diagnosis not present

## 2015-08-11 DIAGNOSIS — D689 Coagulation defect, unspecified: Secondary | ICD-10-CM | POA: Diagnosis not present

## 2015-08-11 DIAGNOSIS — D509 Iron deficiency anemia, unspecified: Secondary | ICD-10-CM | POA: Diagnosis not present

## 2015-08-11 DIAGNOSIS — E876 Hypokalemia: Secondary | ICD-10-CM | POA: Diagnosis not present

## 2015-08-13 DIAGNOSIS — N2581 Secondary hyperparathyroidism of renal origin: Secondary | ICD-10-CM | POA: Diagnosis not present

## 2015-08-13 DIAGNOSIS — E876 Hypokalemia: Secondary | ICD-10-CM | POA: Diagnosis not present

## 2015-08-13 DIAGNOSIS — D631 Anemia in chronic kidney disease: Secondary | ICD-10-CM | POA: Diagnosis not present

## 2015-08-13 DIAGNOSIS — D509 Iron deficiency anemia, unspecified: Secondary | ICD-10-CM | POA: Diagnosis not present

## 2015-08-13 DIAGNOSIS — N186 End stage renal disease: Secondary | ICD-10-CM | POA: Diagnosis not present

## 2015-08-13 DIAGNOSIS — D689 Coagulation defect, unspecified: Secondary | ICD-10-CM | POA: Diagnosis not present

## 2015-08-16 DIAGNOSIS — D689 Coagulation defect, unspecified: Secondary | ICD-10-CM | POA: Diagnosis not present

## 2015-08-16 DIAGNOSIS — D509 Iron deficiency anemia, unspecified: Secondary | ICD-10-CM | POA: Diagnosis not present

## 2015-08-16 DIAGNOSIS — N2581 Secondary hyperparathyroidism of renal origin: Secondary | ICD-10-CM | POA: Diagnosis not present

## 2015-08-16 DIAGNOSIS — N186 End stage renal disease: Secondary | ICD-10-CM | POA: Diagnosis not present

## 2015-08-16 DIAGNOSIS — E876 Hypokalemia: Secondary | ICD-10-CM | POA: Diagnosis not present

## 2015-08-16 DIAGNOSIS — D631 Anemia in chronic kidney disease: Secondary | ICD-10-CM | POA: Diagnosis not present

## 2015-08-18 DIAGNOSIS — D509 Iron deficiency anemia, unspecified: Secondary | ICD-10-CM | POA: Diagnosis not present

## 2015-08-18 DIAGNOSIS — D631 Anemia in chronic kidney disease: Secondary | ICD-10-CM | POA: Diagnosis not present

## 2015-08-18 DIAGNOSIS — D689 Coagulation defect, unspecified: Secondary | ICD-10-CM | POA: Diagnosis not present

## 2015-08-18 DIAGNOSIS — N2581 Secondary hyperparathyroidism of renal origin: Secondary | ICD-10-CM | POA: Diagnosis not present

## 2015-08-18 DIAGNOSIS — N186 End stage renal disease: Secondary | ICD-10-CM | POA: Diagnosis not present

## 2015-08-18 DIAGNOSIS — E876 Hypokalemia: Secondary | ICD-10-CM | POA: Diagnosis not present

## 2015-08-19 ENCOUNTER — Other Ambulatory Visit: Payer: Self-pay | Admitting: *Deleted

## 2015-08-19 NOTE — Patient Outreach (Signed)
[facility]N) Care Management  08/19/2015  Jeanelle Mallingdward L Bontrager 07-25-1941 [id number]   Transition of care week #2 0930 Unsuccessful attempt to reach patient by telephone- unable to leave a message. 1400 Unsuccessful attempt to reach patient by telephone-   Plan Will attempt phone call later today, or Friday.  If continued unsuccessful attempt to contact patient I will reach out to contacts on consent.  Egbert GaribaldiKimberly Glover, RN, [facility] Care Management [phone] Mobile [phone]- Toll Free Main Office

## 2015-08-20 ENCOUNTER — Other Ambulatory Visit: Payer: Self-pay | Admitting: *Deleted

## 2015-08-20 DIAGNOSIS — D631 Anemia in chronic kidney disease: Secondary | ICD-10-CM | POA: Diagnosis not present

## 2015-08-20 DIAGNOSIS — N186 End stage renal disease: Secondary | ICD-10-CM | POA: Diagnosis not present

## 2015-08-20 DIAGNOSIS — D509 Iron deficiency anemia, unspecified: Secondary | ICD-10-CM | POA: Diagnosis not present

## 2015-08-20 DIAGNOSIS — E876 Hypokalemia: Secondary | ICD-10-CM | POA: Diagnosis not present

## 2015-08-20 DIAGNOSIS — D689 Coagulation defect, unspecified: Secondary | ICD-10-CM | POA: Diagnosis not present

## 2015-08-20 DIAGNOSIS — N2581 Secondary hyperparathyroidism of renal origin: Secondary | ICD-10-CM | POA: Diagnosis not present

## 2015-08-20 NOTE — Patient Outreach (Signed)
[facility](THN) Care Management  08/20/2015  Jeanelle Mallingdward L Nunley 02-Sep-1941 [id number]   Transition of care call Week #2 0900 Unsuccessful outreach call to Mr.Shane [name] this am, no answer.  1530 Spoke with Mr.Leveille's daughter Sanda KleinMichelle Ravenell ( Name listed on [facility] consent and as his #1 Emergency contact), by phone, she is currently with Mr.Yackley while his is [place]. Marcelino DusterMichelle reports that Mr.Lovelady has attended his office visit at Dr.Hamricks on last week as well as seeing Dr.Colodanato at the dialysis center.  She reports concern regarding need for  patient to be checked on the day after his dialysis,she voiced concern regarding whether his blood pressure may be low sometimes, and they have had to make adjustments in some of his medication. Marcelino DusterMichelle states that  Mr.Cravens would benefit from RN care coordinator home visit to assess for further needs. She voiced she believes patient would benefit from Home health services that were recommended at discharge that he declined.  Plan:  Will schedule home visit on Tuesday November 15 at 4pm, to assess for further needs. Daughter will be present at visit.  Egbert GaribaldiKimberly Pranish Akhavan, RN, [facility] Care Management [phone] Mobile [phone]- Toll Free Main Office

## 2015-08-23 DIAGNOSIS — D631 Anemia in chronic kidney disease: Secondary | ICD-10-CM | POA: Diagnosis not present

## 2015-08-23 DIAGNOSIS — D689 Coagulation defect, unspecified: Secondary | ICD-10-CM | POA: Diagnosis not present

## 2015-08-23 DIAGNOSIS — N2581 Secondary hyperparathyroidism of renal origin: Secondary | ICD-10-CM | POA: Diagnosis not present

## 2015-08-23 DIAGNOSIS — N186 End stage renal disease: Secondary | ICD-10-CM | POA: Diagnosis not present

## 2015-08-23 DIAGNOSIS — E876 Hypokalemia: Secondary | ICD-10-CM | POA: Diagnosis not present

## 2015-08-23 DIAGNOSIS — D509 Iron deficiency anemia, unspecified: Secondary | ICD-10-CM | POA: Diagnosis not present

## 2015-08-24 ENCOUNTER — Encounter: Payer: Self-pay | Admitting: *Deleted

## 2015-08-24 ENCOUNTER — Other Ambulatory Visit: Payer: Self-pay | Admitting: *Deleted

## 2015-08-24 NOTE — Patient Outreach (Signed)
[facility]) Care Management   08/24/2015  Shane [name] 1941/05/26 [id number]  Shane [name] is an 74 y.o. male  Transition of care week #3 Home visit Patient's two son's Shane [name] present during home visit.  Subjective: "I am managing well at home,staying busy going to dialysis 3 days a week'  Objective:   Review of Systems  Constitutional: Negative.   HENT: Negative.   Respiratory: Positive for shortness of breath.        Shortness of breath with activity,relief after resting  Cardiovascular: Negative for chest pain.  Gastrointestinal: Negative.   Musculoskeletal: Negative for falls.  Skin: Negative.   Psychiatric/Behavioral: Negative for depression.    Physical Exam  Constitutional: He is oriented to person, place, and time. He appears well-developed and well-nourished.  Cardiovascular: Normal rate, regular rhythm and normal heart sounds.   Left arm fistula with bruit and thrill present  Musculoskeletal: He exhibits no edema.  Neurological: He is alert and oriented to person, place, and time.  Skin: Skin is warm and dry.  Psychiatric: He has a normal mood and affect. His behavior is normal. Judgment and thought content normal.   BP 130/72 mmHg  Pulse 90  Resp 20  SpO2 96% Current Medications:   Current Outpatient Prescriptions  Medication Sig Dispense Refill  . albuterol-ipratropium (COMBIVENT) 18-103 MCG/ACT inhaler Inhale 2 puffs into the lungs every 6 (six) hours as needed for wheezing. 1 Inhaler 6  . amLODipine (NORVASC) 10 MG tablet Take 1 tablet (10 mg total) by mouth daily. 30 tablet 11  . calcium acetate (PHOSLO) 667 MG capsule Take 2,001 mg by mouth daily.    . finasteride (PROSCAR) 5 MG tablet Take 5 mg by mouth daily.    . furosemide (LASIX) 40 MG tablet Take 1 tablet (40 mg total) by mouth daily. (Patient taking differently: Take 40 mg by mouth 2 (two) times daily. ) 30 tablet 0  . metoprolol succinate (TOPROL-XL) 50 MG 24  hr tablet Take 50 mg by mouth daily. Take with or immediately following a meal.    . Multiple Vitamin (MULTIVITAMIN WITH MINERALS) TABS tablet Take 1 tablet by mouth daily. 30 tablet 0  . Tamsulosin HCl (FLOMAX) 0.4 MG CAPS Take 0.4 mg by mouth 2 (two) times daily.     . camphor-menthol (SARNA) lotion Apply topically as needed for itching. (Patient not taking: Reported on 08/24/2015) 222 mL 0  . diphenhydrAMINE (BENADRYL) 25 mg capsule Take 1 capsule (25 mg total) by mouth every 6 (six) hours as needed for itching. (Patient not taking: Reported on 05/18/2015) 30 capsule 0   No current facility-administered medications for this visit.    Functional Status:   In your present state of health, do you have any difficulty performing the following activities: 08/24/2015 08/01/2015  Hearing? N N  Vision? N N  Difficulty concentrating or making decisions? N N  Walking or climbing stairs? Y Y  Dressing or bathing? N N  Doing errands, shopping? Y Y  Using the Toilet? N -  Do you have problems with loss of bowel control? N -  Managing your Finances? N -  Housekeeping or managing your Housekeeping? N -    Fall/Depression Screening:    PHQ 2/9 Scores 08/24/2015  PHQ - 2 Score 0    Assessment:   Chronic kidney Disease : Patient reports attending hemodialysis 3 days per week, Monday,Wednesday, Friday, reports tolerating well except feels tired afterwards, denies dizziness when questioned, vital signs  stable today. Patient reports using a cane at times. Patient reports that he has been able to see is Dr.Colanado for [facility], states he had to reschedule his appointment with Dr.Hamrick office .[name] reports that he is taking his medication as prescribed, does not take medication until after hemodialysis treatment. Mr.Binning reports that he has a working scale and weighs at least 3 days a week on his scales otherwise he weighs at the dialysis center. Mr.Leh states that he  is managing well at home, and denies and declines need for further services at home. Mr.Suares's  2 son's that are present,states that  they are not having a problem with providing transportation to dialysis, [place] to [place], they declined need for assistance in pursuing other means of transportation.   Encouraged patient to call for questions or medical concerns or change in condition . Reminded him of my contact information. Patient states that he is not sure whether he wants to continue home visit past transition of care.  Plan:  Will follow up with transition of care call program, patient states if he doesn't answer next Tuesday to try him the following week on Tuesday. Send visit note to Dr.Hamrick   [facility] CM Care Plan Problem One        Most Recent Value   Care Plan Problem One  Recent Hospital Admission    Role Documenting the Problem One  Care Management Coordinator   Care Plan for Problem One  Active   THN Long Term Goal (31-90 days)  Patient will not experience a hospital admission in next 31 days   THN Long Term Goal Start Date  08/10/15   Interventions for Problem One Long Term Goal  Reviewed importance of notifying MD of health concerns, Take medications as prescribed ,Discussed importance of timely follow up with primary care  provider , encouraged  to attend each dialysis session   THN CM Short Term Goal #1 (0-30 days)  Patient will attend primary care provider appointment within 7 days of discharge   [facility] CM Short Term Goal #1 Start Date  08/10/15   Interventions for Short Term Goal #1  Patient son Ramon Dredgedward will call  this wee to reschedule visit with PCP, [patient did have visit with Renal MD with in 7 days of d/c]   THN CM Short Term Goal #2 (0-30 days)  Patient will not experience a hospital admission in the next 30 days   THN CM Short Term Goal #2 Start Date  08/10/15   Interventions for Short Term Goal #2  Discussed with pt and  famiily importance of taking medications as prescibed, keeping track of his weights on a daily basis, reminded of my contact information for needs or concerns regarding care     Egbert GaribaldiKimberly Sharday Michl, RN, [facility] Care Management [phone]- Mobile [phone]- Toll Free Main Office

## 2015-08-25 DIAGNOSIS — D631 Anemia in chronic kidney disease: Secondary | ICD-10-CM | POA: Diagnosis not present

## 2015-08-25 DIAGNOSIS — D509 Iron deficiency anemia, unspecified: Secondary | ICD-10-CM | POA: Diagnosis not present

## 2015-08-25 DIAGNOSIS — N186 End stage renal disease: Secondary | ICD-10-CM | POA: Diagnosis not present

## 2015-08-25 DIAGNOSIS — E876 Hypokalemia: Secondary | ICD-10-CM | POA: Diagnosis not present

## 2015-08-25 DIAGNOSIS — D689 Coagulation defect, unspecified: Secondary | ICD-10-CM | POA: Diagnosis not present

## 2015-08-25 DIAGNOSIS — N2581 Secondary hyperparathyroidism of renal origin: Secondary | ICD-10-CM | POA: Diagnosis not present

## 2015-08-27 DIAGNOSIS — N186 End stage renal disease: Secondary | ICD-10-CM | POA: Diagnosis not present

## 2015-08-27 DIAGNOSIS — E876 Hypokalemia: Secondary | ICD-10-CM | POA: Diagnosis not present

## 2015-08-27 DIAGNOSIS — D689 Coagulation defect, unspecified: Secondary | ICD-10-CM | POA: Diagnosis not present

## 2015-08-27 DIAGNOSIS — D509 Iron deficiency anemia, unspecified: Secondary | ICD-10-CM | POA: Diagnosis not present

## 2015-08-27 DIAGNOSIS — N2581 Secondary hyperparathyroidism of renal origin: Secondary | ICD-10-CM | POA: Diagnosis not present

## 2015-08-27 DIAGNOSIS — D631 Anemia in chronic kidney disease: Secondary | ICD-10-CM | POA: Diagnosis not present

## 2015-08-30 DIAGNOSIS — D631 Anemia in chronic kidney disease: Secondary | ICD-10-CM | POA: Diagnosis not present

## 2015-08-30 DIAGNOSIS — E876 Hypokalemia: Secondary | ICD-10-CM | POA: Diagnosis not present

## 2015-08-30 DIAGNOSIS — D689 Coagulation defect, unspecified: Secondary | ICD-10-CM | POA: Diagnosis not present

## 2015-08-30 DIAGNOSIS — N186 End stage renal disease: Secondary | ICD-10-CM | POA: Diagnosis not present

## 2015-08-30 DIAGNOSIS — N2581 Secondary hyperparathyroidism of renal origin: Secondary | ICD-10-CM | POA: Diagnosis not present

## 2015-08-30 DIAGNOSIS — D509 Iron deficiency anemia, unspecified: Secondary | ICD-10-CM | POA: Diagnosis not present

## 2015-08-31 ENCOUNTER — Other Ambulatory Visit: Payer: Self-pay | Admitting: *Deleted

## 2015-08-31 NOTE — Patient Outreach (Signed)
[facility] Care Management  [facility]  08/31/2015   Shane [name] [birth year] [id number]  Subjective: Everything is fine"  Objective:   Current Medications:  Current Outpatient Prescriptions  Medication Sig Dispense Refill  . albuterol-ipratropium (COMBIVENT) 18-103 MCG/ACT inhaler Inhale 2 puffs into the lungs every 6 (six) hours as needed for wheezing. 1 Inhaler 6  . amLODipine (NORVASC) 10 MG tablet Take 1 tablet (10 mg total) by mouth daily. 30 tablet 11  . calcium acetate (PHOSLO) 667 MG capsule Take 2,001 mg by mouth daily.    . camphor-menthol (SARNA) lotion Apply topically as needed for itching. (Patient not taking: Reported on 08/24/2015) 222 mL 0  . diphenhydrAMINE (BENADRYL) 25 mg capsule Take 1 capsule (25 mg total) by mouth every 6 (six) hours as needed for itching. (Patient not taking: Reported on 05/18/2015) 30 capsule 0  . finasteride (PROSCAR) 5 MG tablet Take 5 mg by mouth daily.    . furosemide (LASIX) 40 MG tablet Take 1 tablet (40 mg total) by mouth daily. (Patient taking differently: Take 40 mg by mouth 2 (two) times daily. ) 30 tablet 0  . metoprolol succinate (TOPROL-XL) 50 MG 24 hr tablet Take 50 mg by mouth daily. Take with or immediately following a meal.    . Multiple Vitamin (MULTIVITAMIN WITH MINERALS) TABS tablet Take 1 tablet by mouth daily. 30 tablet 0  . Tamsulosin HCl (FLOMAX) 0.4 MG CAPS Take 0.4 mg by mouth 2 (two) times daily.      No current facility-administered medications for this visit.    Functional Status:  In your present state of health, do you have any difficulty performing the following activities: 08/24/2015 08/24/2015  Hearing? - N  Vision? - N  Difficulty concentrating or making decisions? - N  Walking or climbing stairs? - Y  Dressing or bathing? - N  Doing errands, shopping? - Y  Preparing Food and eating ? N -  Using the Toilet? N N  In the past six months, have you accidently leaked urine? Y -   Do you have problems with loss of bowel control? N N  Managing your Medications? N -  Managing your Finances? N N  Housekeeping or managing your Housekeeping? N N    Fall/Depression Screening: PHQ 2/9 Scores 08/24/2015  PHQ - 2 Score 0    Assessment:  Chronic Kidney Disease Patient reports everything is going fine when questioned related to how he is doing, Patient continues with   Monday,Wednesday,Friday Hemodialysis denies problems. Reports that he is still taking is medication fine.  Denies further concerns or needs at this time Plan:  Patient agreeable to  transition of care call on next Tuesday, and discuss case closure. Review patient care goal progress.  Egbert GaribaldiKimberly Glover, RN, [facility] Care Management [phone]- Mobile [phone]- Toll Free Main Office

## 2015-09-01 ENCOUNTER — Encounter (HOSPITAL_COMMUNITY): Payer: Self-pay | Admitting: Emergency Medicine

## 2015-09-01 ENCOUNTER — Emergency Department (HOSPITAL_COMMUNITY)
Admission: EM | Admit: 2015-09-01 | Discharge: 2015-09-01 | Disposition: A | Discharge status: Home / Self Care | Payer: Medicare Other | Attending: Emergency Medicine | Admitting: Emergency Medicine

## 2015-09-01 DIAGNOSIS — D689 Coagulation defect, unspecified: Secondary | ICD-10-CM | POA: Diagnosis not present

## 2015-09-01 DIAGNOSIS — K4091 Unilateral inguinal hernia, without obstruction or gangrene, recurrent: Secondary | ICD-10-CM | POA: Diagnosis not present

## 2015-09-01 DIAGNOSIS — F17210 Nicotine dependence, cigarettes, uncomplicated: Secondary | ICD-10-CM | POA: Insufficient documentation

## 2015-09-01 DIAGNOSIS — N2581 Secondary hyperparathyroidism of renal origin: Secondary | ICD-10-CM | POA: Diagnosis not present

## 2015-09-01 DIAGNOSIS — Z8739 Personal history of other diseases of the musculoskeletal system and connective tissue: Secondary | ICD-10-CM | POA: Insufficient documentation

## 2015-09-01 DIAGNOSIS — I129 Hypertensive chronic kidney disease with stage 1 through stage 4 chronic kidney disease, or unspecified chronic kidney disease: Secondary | ICD-10-CM | POA: Insufficient documentation

## 2015-09-01 DIAGNOSIS — N189 Chronic kidney disease, unspecified: Secondary | ICD-10-CM | POA: Insufficient documentation

## 2015-09-01 DIAGNOSIS — Z8639 Personal history of other endocrine, nutritional and metabolic disease: Secondary | ICD-10-CM | POA: Insufficient documentation

## 2015-09-01 DIAGNOSIS — D509 Iron deficiency anemia, unspecified: Secondary | ICD-10-CM | POA: Diagnosis not present

## 2015-09-01 DIAGNOSIS — Z79899 Other long term (current) drug therapy: Secondary | ICD-10-CM | POA: Diagnosis not present

## 2015-09-01 DIAGNOSIS — K4090 Unilateral inguinal hernia, without obstruction or gangrene, not specified as recurrent: Secondary | ICD-10-CM | POA: Diagnosis present

## 2015-09-01 DIAGNOSIS — E876 Hypokalemia: Secondary | ICD-10-CM | POA: Diagnosis not present

## 2015-09-01 DIAGNOSIS — N186 End stage renal disease: Secondary | ICD-10-CM | POA: Diagnosis not present

## 2015-09-01 DIAGNOSIS — D631 Anemia in chronic kidney disease: Secondary | ICD-10-CM | POA: Diagnosis not present

## 2015-09-01 NOTE — ED Notes (Signed)
Wyatt MD was able to push hernia back in place, pt currently reporting no pain. In NAD

## 2015-09-01 NOTE — Discharge Instructions (Signed)

## 2015-09-01 NOTE — ED Provider Notes (Signed)
CSN: [id number]     Arrival date & time 09/01/15  1825 History   First MD Initiated Contact with Patient 09/01/15 1852     Chief Complaint  Patient presents with  . Hernia      HPI Pt has history of hernia in right groin area that was "pushed back in" here about 2 years ago.Today during dialysis pt states hernia came back out and started to cause pain in right groin area.  Past Medical History  Diagnosis Date  . Hypertension   . Incarcerated inguinal hernia, unilateral     right side  . Tobacco abuse   . Varicocele   . 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 Arthritis     bursitis in hip  . 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 Av fistula placement, radiocephalic  01/30/2011    Right w/ ligation of competing venous branch by Dr. Leonides SakeBrian Chen  . Av fistula placement, brachiocephalic  10/19/2010    LEFT  . Av fistula placement, radiocephalic  09/13/2010    LEFT  . Colonoscopy    . Bascilic vein transposition Left 04/08/2015    Procedure: BASILIC VEIN TRANSPOSITION;  Surgeon: Pryor OchoaJames D Lawson, MD;  [facility];  Service: Vascular;  Laterality: Left;   Family History  Problem Relation Age of Onset  . Asthma Mother   . Hypertension Mother   . Alcohol abuse Father   . Hypertension Father   . Hypertension Sister   . Diabetes Sister   . Heart disease Sister   . Hypertension Brother   . Peripheral vascular disease Brother   . Hypertension Daughter   . Heart disease Daughter    Social History  Substance Use Topics  . Smoking status: Current Every Day Smoker -- 0.50 packs/day for 50 years    Types: Cigarettes  . Smokeless tobacco: Never Used  . Alcohol Use: No    Review of Systems  All other systems reviewed and are negative  Allergies  Review of patient's allergies indicates no known allergies.  Home Medications   Prior  to Admission medications   Medication Sig Start Date End Date Taking? Authorizing Provider  albuterol-ipratropium (COMBIVENT) 18-103 MCG/ACT inhaler Inhale 2 puffs into the lungs every 6 (six) hours as needed for wheezing. 02/11/13   Genelle GatherKathryn F Glenn, MD  amLODipine (NORVASC) 10 MG tablet Take 1 tablet (10 mg total) by mouth daily. 02/12/13   Genelle GatherKathryn F Glenn, MD  calcium acetate (PHOSLO) 667 MG capsule Take 2,001 mg by mouth daily.    Historical Provider, MD  camphor-menthol Wynelle Fanny(SARNA) lotion Apply topically as needed for itching. Patient not taking: Reported on 08/24/2015 04/15/15   Belkys A Regalado, MD  diphenhydrAMINE (BENADRYL) 25 mg capsule Take 1 capsule (25 mg total) by mouth every 6 (six) hours as needed for itching. Patient not taking: Reported on 05/18/2015 04/15/15   Belkys A Regalado, MD  finasteride (PROSCAR) 5 MG tablet Take 5 mg by mouth daily.    Historical Provider, MD  furosemide (LASIX) 40 MG tablet Take 1 tablet (40 mg total) by mouth daily. Patient taking differently: Take 40 mg by mouth 2 (two) times daily.  04/15/15   Belkys A Regalado, MD  metoprolol succinate (TOPROL-XL) 50 MG 24 hr tablet Take 50 mg by mouth daily. Take with or immediately following a meal.  Historical Provider, MD  Multiple Vitamin (MULTIVITAMIN WITH MINERALS) TABS tablet Take 1 tablet by mouth daily. 04/15/15   Belkys A Regalado, MD  Tamsulosin HCl (FLOMAX) 0.4 MG CAPS Take 0.4 mg by mouth 2 (two) times daily.     Historical Provider, MD   BP 145/88 mmHg  Pulse 98  Temp(Src) 98.2 F (36.8 C) (Oral)  Resp 16  Ht  (1.778 m)  Wt 153 lb (69.4 kg)  BMI 21.95 kg/m2  SpO2 99% Physical Exam  Constitutional: He is oriented to person, place, and time. He appears well-developed and well-nourished. No distress.  HENT:  Head: Normocephalic and atraumatic.  Eyes: Pupils are equal, round, and reactive to light.  Neck: Normal range of motion.  Cardiovascular: Normal rate and intact distal pulses.   Pulmonary/Chest:  No respiratory distress.  Abdominal: Normal appearance. He exhibits no distension. There is tenderness. A hernia is present. Hernia confirmed positive in the right inguinal area.    Musculoskeletal: Normal range of motion.  Neurological: He is alert and oriented to person, place, and time. No cranial nerve deficit.  Skin: Skin is warm and dry. No rash noted.  Psychiatric: He has a normal mood and affect. His behavior is normal.  Nursing note and vitals reviewed.   ED Course  Procedures (including critical care time) Labs Review Labs Reviewed - No data to display  Imaging Review No results found. I have personally reviewed and evaluated these images and lab results as part of my medical decision-making.  I spoke with general surgery.  They will come and evaluate for possible hernia.  Dr. Lindie Spruce of general surgery came and reduced the small hernia.  MDM   Final diagnoses:  Unilateral recurrent inguinal hernia without obstruction or gangrene        Nelva Nay, MD 09/01/15 1945

## 2015-09-01 NOTE — ED Notes (Signed)
Wyatt MD at bedside. 

## 2015-09-01 NOTE — ED Notes (Signed)
Pt has history of hernia in right groin area that was "pushed back in" here about 2 years ago.Today during dialysis pt states hernia came back out and started to cause pain in right groin area.

## 2015-09-01 NOTE — ED Notes (Signed)
MD at bedside. 

## 2015-09-04 DIAGNOSIS — D689 Coagulation defect, unspecified: Secondary | ICD-10-CM | POA: Diagnosis not present

## 2015-09-04 DIAGNOSIS — D631 Anemia in chronic kidney disease: Secondary | ICD-10-CM | POA: Diagnosis not present

## 2015-09-04 DIAGNOSIS — N186 End stage renal disease: Secondary | ICD-10-CM | POA: Diagnosis not present

## 2015-09-04 DIAGNOSIS — D509 Iron deficiency anemia, unspecified: Secondary | ICD-10-CM | POA: Diagnosis not present

## 2015-09-04 DIAGNOSIS — N2581 Secondary hyperparathyroidism of renal origin: Secondary | ICD-10-CM | POA: Diagnosis not present

## 2015-09-04 DIAGNOSIS — E876 Hypokalemia: Secondary | ICD-10-CM | POA: Diagnosis not present

## 2015-09-06 DIAGNOSIS — E876 Hypokalemia: Secondary | ICD-10-CM | POA: Diagnosis not present

## 2015-09-06 DIAGNOSIS — N186 End stage renal disease: Secondary | ICD-10-CM | POA: Diagnosis not present

## 2015-09-06 DIAGNOSIS — D631 Anemia in chronic kidney disease: Secondary | ICD-10-CM | POA: Diagnosis not present

## 2015-09-06 DIAGNOSIS — N2581 Secondary hyperparathyroidism of renal origin: Secondary | ICD-10-CM | POA: Diagnosis not present

## 2015-09-06 DIAGNOSIS — D689 Coagulation defect, unspecified: Secondary | ICD-10-CM | POA: Diagnosis not present

## 2015-09-06 DIAGNOSIS — D509 Iron deficiency anemia, unspecified: Secondary | ICD-10-CM | POA: Diagnosis not present

## 2015-09-08 DIAGNOSIS — E859 Amyloidosis, unspecified: Secondary | ICD-10-CM | POA: Diagnosis not present

## 2015-09-08 DIAGNOSIS — N2581 Secondary hyperparathyroidism of renal origin: Secondary | ICD-10-CM | POA: Diagnosis not present

## 2015-09-08 DIAGNOSIS — D509 Iron deficiency anemia, unspecified: Secondary | ICD-10-CM | POA: Diagnosis not present

## 2015-09-08 DIAGNOSIS — D631 Anemia in chronic kidney disease: Secondary | ICD-10-CM | POA: Diagnosis not present

## 2015-09-08 DIAGNOSIS — D689 Coagulation defect, unspecified: Secondary | ICD-10-CM | POA: Diagnosis not present

## 2015-09-08 DIAGNOSIS — E876 Hypokalemia: Secondary | ICD-10-CM | POA: Diagnosis not present

## 2015-09-08 DIAGNOSIS — N186 End stage renal disease: Secondary | ICD-10-CM | POA: Diagnosis not present

## 2015-09-08 DIAGNOSIS — Z992 Dependence on renal dialysis: Secondary | ICD-10-CM | POA: Diagnosis not present

## 2015-09-09 ENCOUNTER — Other Ambulatory Visit: Payer: Self-pay | Admitting: *Deleted

## 2015-09-09 NOTE — Patient Outreach (Signed)
[facility]N) Care Management  09/09/2015  Jeanelle Mallingdward L Milson 07-07-1941 [id number]   Transition of care: week 4 Unsuccessful attempt to reach patient for transition of care call, unable to leave a message phone just rang. Will attempt to reach patient by phone at dialysis center 12/2   Egbert GaribaldiKimberly Glover, RN, [facility] Care Management [phone] Mobile [phone]- Toll Free Main Office

## 2015-09-10 ENCOUNTER — Other Ambulatory Visit: Payer: Self-pay | Admitting: *Deleted

## 2015-09-10 DIAGNOSIS — D631 Anemia in chronic kidney disease: Secondary | ICD-10-CM | POA: Diagnosis not present

## 2015-09-10 DIAGNOSIS — N186 End stage renal disease: Secondary | ICD-10-CM | POA: Diagnosis not present

## 2015-09-10 DIAGNOSIS — Z283 Underimmunization status: Secondary | ICD-10-CM | POA: Diagnosis not present

## 2015-09-10 DIAGNOSIS — D689 Coagulation defect, unspecified: Secondary | ICD-10-CM | POA: Diagnosis not present

## 2015-09-10 DIAGNOSIS — D509 Iron deficiency anemia, unspecified: Secondary | ICD-10-CM | POA: Diagnosis not present

## 2015-09-10 DIAGNOSIS — N2581 Secondary hyperparathyroidism of renal origin: Secondary | ICD-10-CM | POA: Diagnosis not present

## 2015-09-10 NOTE — Patient Outreach (Signed)
[facility]) Care Management  09/10/2015  Shane [name] [birth year] [id number]   Unsuccessful attempt to reach [name] by phone today. Plan return telephone call to patient on Tuesday, December 6, not his dialysis day.  Egbert GaribaldiKimberly Glover, RN, [facility] Care Management [phone]- Mobile [phone]- Toll Free Main Office

## 2015-09-10 NOTE — Patient Outreach (Signed)
[facility]N) Care Management  09/10/2015  Jeanelle Mallingdward L Swinson May 19, 1941 [id number]  Transition of care 0900 Telephone outreach call to patient's home telephone number, no answer unable to leave a message. Plan to try and reach patient while at hemodialysis today.  1220 Attempted to reach patient at Hemodialysis, patient request that I call him later today at his home telephone number.   Assessment: Trying to each patient as end of transition period to follow up, and to determine if patient is agreeable to continued care management services with a home visit this month.  Plan We attempt to contact patient later today as he requested.  Egbert GaribaldiKimberly Skanda Worlds, RN, [facility] Care Management [phone]- Mobile [phone]- Toll Free Main Office

## 2015-09-13 DIAGNOSIS — N186 End stage renal disease: Secondary | ICD-10-CM | POA: Diagnosis not present

## 2015-09-13 DIAGNOSIS — N2581 Secondary hyperparathyroidism of renal origin: Secondary | ICD-10-CM | POA: Diagnosis not present

## 2015-09-13 DIAGNOSIS — D509 Iron deficiency anemia, unspecified: Secondary | ICD-10-CM | POA: Diagnosis not present

## 2015-09-13 DIAGNOSIS — D689 Coagulation defect, unspecified: Secondary | ICD-10-CM | POA: Diagnosis not present

## 2015-09-13 DIAGNOSIS — D631 Anemia in chronic kidney disease: Secondary | ICD-10-CM | POA: Diagnosis not present

## 2015-09-13 DIAGNOSIS — Z283 Underimmunization status: Secondary | ICD-10-CM | POA: Diagnosis not present

## 2015-09-14 ENCOUNTER — Other Ambulatory Visit: Payer: Self-pay | Admitting: *Deleted

## 2015-09-14 NOTE — Patient Outreach (Signed)
[facility]) Care Management  09/14/2015  Shane [name] 07-16-1941 [id number]  Final transition of care call  Subjective: "I am doing pretty good". Patient reports attending dialysis Monday, Wednesday and Friday, with family still providing transportation denies problems. Patient discussed his recent visit to Emergency room due to discomfort related to a hernia , denies discomfort today. [name] reports taking his medication as prescribed.  Plan Patient agreeable to continued community care management involvement  Home visit scheduled for December 15 at 14:30  [facility] CM Care Plan Problem One        Most Recent Value   Care Plan Problem One  [facility] Admission    Role Documenting the Problem One  Care Management [place] for Problem One  Active   [facility]N Long Term Goal (31-90 days)  Patient will not experience a hospital admission in next 31 days   [facility]N Long Term Goal Start Date  08/10/15   [facility]N Long Term Goal Met Date  09/14/15   [facility] CM Short Term Goal #1 (0-30 days)  Patient will attend primary care provider appointment within 7 days of discharge   [facility] CM Short Term Goal #1 Start Date  08/10/15   [facility] CM Short Term Goal #1 Met Date  09/14/15   Interventions for Short Term Goal #1  -- [patient did have visit with Renal MD with in 7 days of d/c]   [facility] CM Short Term Goal #2 (0-30 days)  Patient will not experience a hospital admission in the next 30 days   [facility] CM Short Term Goal #2 Start Date  08/10/15   [facility] CM Short Term Goal #2 Met Date  09/14/15     [name], RN, [place] Care Management [phone]- Mobile [phone]- [place]

## 2015-09-15 DIAGNOSIS — D689 Coagulation defect, unspecified: Secondary | ICD-10-CM | POA: Diagnosis not present

## 2015-09-15 DIAGNOSIS — N186 End stage renal disease: Secondary | ICD-10-CM | POA: Diagnosis not present

## 2015-09-15 DIAGNOSIS — D631 Anemia in chronic kidney disease: Secondary | ICD-10-CM | POA: Diagnosis not present

## 2015-09-15 DIAGNOSIS — D509 Iron deficiency anemia, unspecified: Secondary | ICD-10-CM | POA: Diagnosis not present

## 2015-09-15 DIAGNOSIS — N2581 Secondary hyperparathyroidism of renal origin: Secondary | ICD-10-CM | POA: Diagnosis not present

## 2015-09-15 DIAGNOSIS — Z283 Underimmunization status: Secondary | ICD-10-CM | POA: Diagnosis not present

## 2015-09-17 DIAGNOSIS — D689 Coagulation defect, unspecified: Secondary | ICD-10-CM | POA: Diagnosis not present

## 2015-09-17 DIAGNOSIS — D631 Anemia in chronic kidney disease: Secondary | ICD-10-CM | POA: Diagnosis not present

## 2015-09-17 DIAGNOSIS — Z283 Underimmunization status: Secondary | ICD-10-CM | POA: Diagnosis not present

## 2015-09-17 DIAGNOSIS — D509 Iron deficiency anemia, unspecified: Secondary | ICD-10-CM | POA: Diagnosis not present

## 2015-09-17 DIAGNOSIS — N186 End stage renal disease: Secondary | ICD-10-CM | POA: Diagnosis not present

## 2015-09-17 DIAGNOSIS — N2581 Secondary hyperparathyroidism of renal origin: Secondary | ICD-10-CM | POA: Diagnosis not present

## 2015-09-20 DIAGNOSIS — D509 Iron deficiency anemia, unspecified: Secondary | ICD-10-CM | POA: Diagnosis not present

## 2015-09-20 DIAGNOSIS — Z283 Underimmunization status: Secondary | ICD-10-CM | POA: Diagnosis not present

## 2015-09-20 DIAGNOSIS — D689 Coagulation defect, unspecified: Secondary | ICD-10-CM | POA: Diagnosis not present

## 2015-09-20 DIAGNOSIS — N2581 Secondary hyperparathyroidism of renal origin: Secondary | ICD-10-CM | POA: Diagnosis not present

## 2015-09-20 DIAGNOSIS — D631 Anemia in chronic kidney disease: Secondary | ICD-10-CM | POA: Diagnosis not present

## 2015-09-20 DIAGNOSIS — N186 End stage renal disease: Secondary | ICD-10-CM | POA: Diagnosis not present

## 2015-09-22 DIAGNOSIS — N186 End stage renal disease: Secondary | ICD-10-CM | POA: Diagnosis not present

## 2015-09-22 DIAGNOSIS — D509 Iron deficiency anemia, unspecified: Secondary | ICD-10-CM | POA: Diagnosis not present

## 2015-09-22 DIAGNOSIS — Z283 Underimmunization status: Secondary | ICD-10-CM | POA: Diagnosis not present

## 2015-09-22 DIAGNOSIS — N2581 Secondary hyperparathyroidism of renal origin: Secondary | ICD-10-CM | POA: Diagnosis not present

## 2015-09-22 DIAGNOSIS — D631 Anemia in chronic kidney disease: Secondary | ICD-10-CM | POA: Diagnosis not present

## 2015-09-22 DIAGNOSIS — D689 Coagulation defect, unspecified: Secondary | ICD-10-CM | POA: Diagnosis not present

## 2015-09-23 ENCOUNTER — Other Ambulatory Visit: Payer: Self-pay | Admitting: *Deleted

## 2015-09-23 NOTE — Patient Outreach (Signed)
[facility]) Care Management   09/23/2015  Shane [name] Mar 06, 1941 [id number]  Shane [name] is an 74 y.o. male  Subjective: " I am doing great, this is my rest day"  Objective:   Review of Systems  Constitutional: Negative.   HENT: Negative.   Eyes: Negative.   Respiratory: Negative for cough.   Cardiovascular: Negative for chest pain, palpitations and leg swelling.  Musculoskeletal: Negative.   Skin: Negative.   Neurological: Negative.   Psychiatric/Behavioral: Negative.    BP 128/70 mmHg  Pulse 90  Resp 20  SpO2 95% Physical Exam  Constitutional: He is oriented to person, place, and time. He appears well-developed and well-nourished.  Cardiovascular: Normal rate and regular rhythm.   Respiratory: Breath sounds normal.  Neurological: He is oriented to person, place, and time.  Skin: Skin is warm and dry.  Psychiatric: He has a normal mood and affect. His behavior is normal.    Current Medications:   Current Outpatient Prescriptions  Medication Sig Dispense Refill  . amLODipine (NORVASC) 10 MG tablet Take 1 tablet (10 mg total) by mouth daily. 30 tablet 11  . calcium acetate (PHOSLO) 667 MG capsule Take 2,001 mg by mouth daily.    . finasteride (PROSCAR) 5 MG tablet Take 5 mg by mouth daily.    . furosemide (LASIX) 40 MG tablet Take 1 tablet (40 mg total) by mouth daily. (Patient taking differently: Take 40 mg by mouth 2 (two) times daily. ) 30 tablet 0  . Multiple Vitamin (MULTIVITAMIN WITH MINERALS) TABS tablet Take 1 tablet by mouth daily. 30 tablet 0  . Tamsulosin HCl (FLOMAX) 0.4 MG CAPS Take 0.4 mg by mouth 2 (two) times daily.     Marland Kitchen albuterol-ipratropium (COMBIVENT) 18-103 MCG/ACT inhaler Inhale 2 puffs into the lungs every 6 (six) hours as needed for wheezing. 1 Inhaler 6  . camphor-menthol (SARNA) lotion Apply topically as needed for itching. (Patient not taking: Reported on 08/24/2015) 222 mL 0  . diphenhydrAMINE (BENADRYL) 25 mg capsule  Take 1 capsule (25 mg total) by mouth every 6 (six) hours as needed for itching. (Patient not taking: Reported on 05/18/2015) 30 capsule 0  . metoprolol succinate (TOPROL-XL) 50 MG 24 hr tablet Take 50 mg by mouth daily. Take with or immediately following a meal.     No current facility-administered medications for this visit.    Functional Status:   In your present state of health, do you have any difficulty performing the following activities: 08/24/2015 08/24/2015  Hearing? - N  Vision? - N  Difficulty concentrating or making decisions? - N  Walking or climbing stairs? - Y  Dressing or bathing? - N  Doing errands, shopping? - Y  Preparing Food and eating ? N -  Using the Toilet? N N  In the past six months, have you accidently leaked urine? Y -  Do you have problems with loss of bowel control? N N  Managing your Medications? N -  Managing your Finances? N N  Housekeeping or managing your Housekeeping? N N    Fall/Depression Screening:    PHQ 2/9 Scores 08/24/2015  PHQ - 2 Score 0    Assessment:   [name] discussed his recent visit to the emergency room due to right inguinal hernia problem and states that he has a follow up appointment with surgeon on next Tuesday, 12/20, to have it maybe repaired. No family present to verify this appointment. I asked [name] if I could talk with a  family member about his upcoming plans, because he sounded unsure, he stated his daughter Illene Regulus is whom I should speak with , but she is very busy and he preferred that I leave my business card with phone number and she will call me.  Chronic Disease: Chronic Kidney Disease [name] reports that he continues to attend  hemodialysis on Monday, Wednesday, Friday and his family provides transportation. Patient states that he sees his kidney doctor every other Monday at center, and that he has not had an office visit at [facility] office.  Patient denies any new problems or concerns, he  reports that he is taking his medication as ordered, and on dialysis days he takes his medication after treatment.  Plan:  Patient request to continue with community care management RN, home visit for next month Will review patient centered care goal,and address new goals   Discuss with family member concerns as the Mr.Wurm agrees Scheduled home visit for January.  [facility] CM Care Plan Problem One        Most Recent Value   Care Plan Problem One  Recent Hospital Admission    Role Documenting the Problem One  Care Management [place] for Problem One  Active   THN Long Term Goal (31-90 days)  Patient will not experience a hospital admission in next 31 days   THN Long Term Goal Start Date  08/10/15   [facility]N Long Term Goal Met Date  09/14/15   [facility] CM Short Term Goal #1 (0-30 days)  Patient will attend primary care provider appointment within 7 days of discharge   [facility] CM Short Term Goal #1 Start Date  08/10/15   [facility] CM Short Term Goal #1 Met Date  09/14/15   Interventions for Short Term Goal #1  -- [patient did have visit with Renal MD with in 7 days of d/c]   [facility] CM Short Term Goal #2 (0-30 days)  Patient will not experience a hospital admission in the next 30 days   [facility] CM Short Term Goal #2 Start Date  08/10/15   [facility] CM Short Term Goal #2 Met Date  09/14/15     [name], RN, [place] Care Management [phone]- Mobile [phone] [place]

## 2015-09-24 DIAGNOSIS — N186 End stage renal disease: Secondary | ICD-10-CM | POA: Diagnosis not present

## 2015-09-24 DIAGNOSIS — Z283 Underimmunization status: Secondary | ICD-10-CM | POA: Diagnosis not present

## 2015-09-24 DIAGNOSIS — D631 Anemia in chronic kidney disease: Secondary | ICD-10-CM | POA: Diagnosis not present

## 2015-09-24 DIAGNOSIS — D689 Coagulation defect, unspecified: Secondary | ICD-10-CM | POA: Diagnosis not present

## 2015-09-24 DIAGNOSIS — D509 Iron deficiency anemia, unspecified: Secondary | ICD-10-CM | POA: Diagnosis not present

## 2015-09-24 DIAGNOSIS — N2581 Secondary hyperparathyroidism of renal origin: Secondary | ICD-10-CM | POA: Diagnosis not present

## 2015-09-27 DIAGNOSIS — Z283 Underimmunization status: Secondary | ICD-10-CM | POA: Diagnosis not present

## 2015-09-27 DIAGNOSIS — N186 End stage renal disease: Secondary | ICD-10-CM | POA: Diagnosis not present

## 2015-09-27 DIAGNOSIS — D631 Anemia in chronic kidney disease: Secondary | ICD-10-CM | POA: Diagnosis not present

## 2015-09-27 DIAGNOSIS — D509 Iron deficiency anemia, unspecified: Secondary | ICD-10-CM | POA: Diagnosis not present

## 2015-09-27 DIAGNOSIS — N2581 Secondary hyperparathyroidism of renal origin: Secondary | ICD-10-CM | POA: Diagnosis not present

## 2015-09-27 DIAGNOSIS — D689 Coagulation defect, unspecified: Secondary | ICD-10-CM | POA: Diagnosis not present

## 2015-09-28 ENCOUNTER — Other Ambulatory Visit: Payer: Self-pay | Admitting: Surgery

## 2015-09-28 DIAGNOSIS — Z716 Tobacco abuse counseling: Secondary | ICD-10-CM | POA: Diagnosis not present

## 2015-09-28 DIAGNOSIS — N401 Enlarged prostate with lower urinary tract symptoms: Secondary | ICD-10-CM

## 2015-09-28 DIAGNOSIS — N400 Enlarged prostate without lower urinary tract symptoms: Secondary | ICD-10-CM | POA: Diagnosis not present

## 2015-09-28 DIAGNOSIS — K4090 Unilateral inguinal hernia, without obstruction or gangrene, not specified as recurrent: Secondary | ICD-10-CM | POA: Diagnosis not present

## 2015-09-28 NOTE — H&P (Signed)
Shane [name] 09/28/2015 11:20 AM Location: [facility] Patient #: [id number] DOB: Feb 05, 1941 Married / Language: English / Race: Black or African American Male  History of Present Illness Ardeth Sportsman MD; 09/28/2015 11:58 AM) The patient is a 74 year old male who presents with an inguinal hernia. Note for "Inguinal hernia": Patient sent for surgical consultation by his nephrologist, Dr. 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 Has had a RIGHT inguinal hernia for several years. Was reduced 2 years ago. He then had another episode of incarceration. Required reduction in the emergency room around Thanksgiving. Dr. Lindie Spruce did that urgently. Based on concerns, surgical consultation requested to consider elective repair. I believe the patient was sent to me due to my greater availability than our Trauma director. Patient just noticed a lump came out while ago. Occasionally hurts. Denies much pain or discomfort on the other side. Seems rather cranky and aggravated today but ultimately consolable  He gets dialyzed Monday Wednesday Friday history of LEFT upper arm fistula. Does have some urinary urgency. There is documentation of a urinary tract infection in the past. He does not recall this. No to have enlarged prostate on CT scan 2 years ago. He says he often stand try and urinate but nothing comes out. Does not urinate very often, oliguric at this point. Does not recall history of urinary tract infection but there is a documentation of that. No issues recently. He does smoke but less than a pack a day. He claims he can walk a couple miles on flat level without much difficulty. Does get winded but never to the point of having to stop. His never had a heart attack or stroke. He's never had basic screening for prostate or colon issues. Did not seem interested in any event at this time.   Other Problems Fay Records,  CMA; 09/28/2015 11:21 AM) Inguinal Hernia  Past Surgical History Fay Records, CMA; 09/28/2015 11:21 AM) Dialysis Shunt / Fistula  Allergies Fay Records, CMA; 09/28/2015 11:22 AM) No Known Drug Allergies 09/28/2015  Medication History Fay Records, CMA; 09/28/2015 11:24 AM) AmLODIPine Besylate (  Tablet, Oral) Active. Calcium Acetate (Phos Binder) (  Capsule, Oral) Active. Finasteride (  Tablet, Oral) Active. Furosemide (  Tablet, Oral) Active. Metoprolol Succinate ER (  Tablet ER 24HR, Oral) Active. Tamsulosin HCl (0.4MG  Capsule, Oral) Active. Multiple Vitamin (Oral) Active. Benadryl (  Capsule, Oral) Active. Sarna Sensitive (1% Lotion, External) Active. Calcium Carbonate (  Tablet, Oral) Active. Combivent (18-103MCG/ACT Aerosol, Inhalation) Active. Medications Reconciled  Social History Fay Records, [place]; 09/28/2015 11:21 AM) Alcohol use Remotely quit alcohol use. Caffeine use Carbonated beverages, Coffee, Tea. Illicit drug use Remotely quit drug use. Tobacco use Current some day smoker.  Family History Fay Records, [place]; 09/28/2015 11:21 AM) Alcohol Abuse Father.     Review of Systems Fay Records CMA; 09/28/2015 11:21 AM) General Not Present- Appetite Loss, Chills, Fatigue, Fever, Night Sweats, Weight Gain and Weight Loss. Skin Not Present- Change in Wart/Mole, Dryness, Hives, Jaundice, New Lesions, Non-Healing Wounds, Rash and Ulcer. HEENT Not Present- Earache, Hearing Loss, Hoarseness, Nose Bleed, Oral Ulcers, Ringing in the Ears, Seasonal Allergies, Sinus Pain, Sore Throat, Visual Disturbances, Wears glasses/contact lenses and Yellow Eyes. Respiratory Not Present- Bloody sputum, Chronic Cough, Difficulty Breathing, Snoring and Wheezing. Breast Not Present- Breast Mass, Breast Pain, Nipple Discharge and Skin Changes. Cardiovascular Not Present- Chest Pain, Difficulty Breathing Lying Down, Leg Cramps, Palpitations, Rapid Heart  Rate, Shortness of Breath and Swelling of Extremities. Gastrointestinal  Not Present- Abdominal Pain, Bloating, Bloody Stool, Change in Bowel Habits, Chronic diarrhea, Constipation, Difficulty Swallowing, Excessive gas, Gets full quickly at meals, Hemorrhoids, Indigestion, Nausea, Rectal Pain and Vomiting. Male Genitourinary Not Present- Blood in Urine, Change in Urinary Stream, Frequency, Impotence, Nocturia, Painful Urination, Urgency and Urine Leakage. Musculoskeletal Not Present- Back Pain, Joint Pain, Joint Stiffness, Muscle Pain, Muscle Weakness and Swelling of Extremities. Neurological Not Present- Decreased Memory, Fainting, Headaches, Numbness, Seizures, Tingling, Tremor, Trouble walking and Weakness. Psychiatric Not Present- Anxiety, Bipolar, Change in Sleep Pattern, Depression, Fearful and Frequent crying. Endocrine Not Present- Cold Intolerance, Excessive Hunger, Hair Changes, Heat Intolerance, Hot flashes and New Diabetes. Hematology Not Present- Easy Bruising, Excessive bleeding, Gland problems, HIV and Persistent Infections.  Vitals Fay Records CMA; 09/28/2015 11:25 AM) 09/28/2015 11:24 AM Weight: 165 lb Height: 70in Body Surface Area: 1.92 m Body Mass Index: 23.67 kg/m  Temp.: 89F(Temporal)  Pulse: 90 (Regular)  BP: 130/70 (Sitting, Left Arm, Standard)      Physical Exam Ardeth Sportsman MD; 09/28/2015 11:44 AM)  General Mental Status-Alert. General Appearance-Not in acute distress, Not Sickly. Orientation-Oriented X3. Hydration-Well hydrated. Voice-Normal.  Integumentary Global Assessment Upon inspection and palpation of skin surfaces of the - Axillae: non-tender, no inflammation or ulceration, no drainage. and Distribution of scalp and body hair is normal. General Characteristics Temperature - normal warmth is noted.  Head and Neck Head-normocephalic, atraumatic with no lesions or palpable masses. Face Global Assessment - atraumatic,  no absence of expression. Neck Global Assessment - no abnormal movements, no bruit auscultated on the right, no bruit auscultated on the left, no decreased range of motion, non-tender. Trachea-midline. Thyroid Gland Characteristics - non-tender.  Eye Eyeball - Left-Extraocular movements intact, No Nystagmus. Eyeball - Right-Extraocular movements intact, No Nystagmus. Cornea - Left-No Hazy. Cornea - Right-No Hazy. Sclera/Conjunctiva - Left-No scleral icterus, No Discharge. Sclera/Conjunctiva - Right-No scleral icterus, No Discharge. Pupil - Left-Direct reaction to light normal. Pupil - Right-Direct reaction to light normal.  ENMT Ears Pinna - Left - no drainage observed, no generalized tenderness observed. Right - no drainage observed, no generalized tenderness observed. Nose and Sinuses External Inspection of the Nose - no destructive lesion observed. Inspection of the nares - Left - quiet respiration. Right - quiet respiration. Mouth and Throat Lips - Upper Lip - no fissures observed, no pallor noted. Lower Lip - no fissures observed, no pallor noted. Nasopharynx - no discharge present. Oral Cavity/Oropharynx - Tongue - no dryness observed. Oral Mucosa - no cyanosis observed. Hypopharynx - no evidence of airway distress observed.  Chest and Lung Exam Inspection Movements - Normal and Symmetrical. Accessory muscles - No use of accessory muscles in breathing. Palpation Palpation of the chest reveals - Non-tender. Auscultation Breath sounds - Normal and Clear.  Cardiovascular Auscultation Rhythm - Regular. Murmurs & Other Heart Sounds - Auscultation of the heart reveals - No Murmurs and No Systolic Clicks.  Abdomen Inspection Inspection of the abdomen reveals - No Visible peristalsis and No Abnormal pulsations. Umbilicus - No Bleeding, No Urine drainage. Palpation/Percussion Palpation and Percussion of the abdomen reveal - Soft, Non Tender, No Rebound  tenderness, No Rigidity (guarding) and No Cutaneous hyperesthesia. Note: Flat. No diastases. No umbilical hernia. No incisions.  Male Genitourinary Sexual Maturity Tanner 5 - Adult hair pattern and Adult penile size and shape. Note: Small but obvious RIGHT inguinal hernia. Possible small LEFT lateral inguinal hernia on Valsalva  Normal external genitalia. Epididymi, testes, and spermatic cords normal without any masses. Thickening of  the scrotal skin but not severe.  Rectal Note: Deferred per patient request  Peripheral Vascular Upper Extremity Inspection - Left - No Cyanotic nailbeds, Not Ischemic. Right - No Cyanotic nailbeds, Not Ischemic.  Neurologic Neurologic evaluation reveals -normal attention span and ability to concentrate, able to name objects and repeat phrases. Appropriate fund of knowledge , normal sensation and normal coordination. Mental Status Affect - not angry, not paranoid. Cranial Nerves-Normal Bilaterally. Gait-Normal.  Neuropsychiatric Mental status exam performed with findings of-able to articulate well with normal speech/language, rate, volume and coherence, thought content normal with ability to perform basic computations and apply abstract reasoning and no evidence of hallucinations, delusions, obsessions or homicidal/suicidal ideation.  Musculoskeletal Global Assessment Spine, Ribs and Pelvis - no instability, subluxation or laxity. Right Upper Extremity - no instability, subluxation or laxity.  Lymphatic Head & Neck  General Head & Neck Lymphatics: Bilateral - Description - No Localized lymphadenopathy. Axillary  General Axillary Region: Bilateral - Description - No Localized lymphadenopathy. Femoral & Inguinal  Generalized Femoral & Inguinal Lymphatics: Left - Description - No Localized lymphadenopathy. Right - Description - No Localized lymphadenopathy.    Assessment & Plan Ardeth Sportsman(Britni Driscoll C. Kevia Zaucha MD; 09/28/2015 11:56 AM)  RIGHT  INGUINAL HERNIA (K40.90) Impression: Small but definite RIGHT inguinal hernia with recent episode of incarceration. Impulse on lateral LEFT groin suspicious for small indirect hernia on the LEFT side as well.  He was a little cranky and we got off on the wrong foot but he seemed to lighten up by the end of the visit.  I think he would benefit from exploration and repair of hernias found. Would plan to do on an off dialysis day. He gets dialysis on Monday Wednesday Friday. Therefore, plan surgery at [facility] on a Tuesday or Thursday. Because his kidney failure is stable and his exercise tolerance is pretty good, can try and set this up as an outpatient surgery. I would have a low threshold to keep overnight if there are concerns.  Current Plans You are being scheduled for surgery - Our schedulers will call you.  You should hear from our office's scheduling department within 5 working days about the location, date, and time of surgery. We try to make accommodations for patient's preferences in scheduling surgery, but sometimes the OR schedule or the surgeon's schedule prevents us from making those accommodations.  If you have not heard from our office [phone]) in 5 working days, call the office and ask for your surgeon's nurse.  If you have other questions about your diagnosis, plan, or surgery, call the office and ask for your surgeon's nurse.  Written instructions provided Pt Education - Pamphlet Given - Laparoscopic Hernia Repair: discussed with patient and provided information. The anatomy & physiology of the abdominal wall and pelvic floor was discussed. The pathophysiology of hernias in the inguinal and pelvic region was discussed. Natural history risks such as progressive enlargement, pain, incarceration, and strangulation was discussed. Contributors to complications such as smoking, obesity, diabetes, prior surgery, etc were discussed.  I feel the risks of no intervention will lead  to serious problems that outweigh the operative risks; therefore, I recommended surgery to reduce and repair the hernia. I explained laparoscopic techniques with possible need for an open approach. I noted usual use of mesh to patch and/or buttress hernia repair  Risks such as bleeding, infection, abscess, need for further treatment, heart attack, death, and other risks were discussed. I noted a good likelihood this will help address the problem.  Goals of post-operative recovery were discussed as well. Possibility that this will not correct all symptoms was explained. I stressed the importance of low-impact activity, aggressive pain control, avoiding constipation, & not pushing through pain to minimize risk of post-operative chronic pain or injury. Possibility of reherniation was discussed. We will work to minimize complications.  An educational handout further explaining the pathology & treatment options was given as well. Questions were answered. The patient expresses understanding & wishes to proceed with surgery.  Pt Education - [facility] Hernia Post-Op HCI (Syleena Mchan): discussed with patient and provided information. Pt Education - [facility] - General recommendations Pt Education - [facility] Pain control - tylenol only: discussed with patient and provided information. ENLARGED PROSTATE (N40.0) Impression: I think he could benefit from evaluation. Seen rather large 2 years ago. Suspect he has some urinary urgency even though he sounds oliguric. I will defer to his nephrologist see if urology consultation of benefit.  TOBACCO ABUSE COUNSELING (Z71.6)  Current Plans Pt Education - CCS STOP SMOKING!  STOP SMOKING! We talked to the patient about the dangers of smoking.  We stressed that tobacco use dramatically increases the risk of peri-operative complications such as infection, tissue necrosis leaving to problems with incision/wound and organ healing, hernia, chronic pain, heart attack, stroke, DVT, pulmonary embolism,  and death.  We noted there are programs in our community to help stop smoking.  Information was available.   Ardeth Sportsman, M.D., F.A.C.S. Gastrointestinal and Minimally Invasive Surgery [facility] [address] [place], [place] [address] [phone] Main / Paging

## 2015-09-29 DIAGNOSIS — D631 Anemia in chronic kidney disease: Secondary | ICD-10-CM | POA: Diagnosis not present

## 2015-09-29 DIAGNOSIS — D689 Coagulation defect, unspecified: Secondary | ICD-10-CM | POA: Diagnosis not present

## 2015-09-29 DIAGNOSIS — D509 Iron deficiency anemia, unspecified: Secondary | ICD-10-CM | POA: Diagnosis not present

## 2015-09-29 DIAGNOSIS — N186 End stage renal disease: Secondary | ICD-10-CM | POA: Diagnosis not present

## 2015-09-29 DIAGNOSIS — Z283 Underimmunization status: Secondary | ICD-10-CM | POA: Diagnosis not present

## 2015-09-29 DIAGNOSIS — N2581 Secondary hyperparathyroidism of renal origin: Secondary | ICD-10-CM | POA: Diagnosis not present

## 2015-10-01 DIAGNOSIS — D509 Iron deficiency anemia, unspecified: Secondary | ICD-10-CM | POA: Diagnosis not present

## 2015-10-01 DIAGNOSIS — D689 Coagulation defect, unspecified: Secondary | ICD-10-CM | POA: Diagnosis not present

## 2015-10-01 DIAGNOSIS — N2581 Secondary hyperparathyroidism of renal origin: Secondary | ICD-10-CM | POA: Diagnosis not present

## 2015-10-01 DIAGNOSIS — D631 Anemia in chronic kidney disease: Secondary | ICD-10-CM | POA: Diagnosis not present

## 2015-10-01 DIAGNOSIS — Z283 Underimmunization status: Secondary | ICD-10-CM | POA: Diagnosis not present

## 2015-10-01 DIAGNOSIS — N186 End stage renal disease: Secondary | ICD-10-CM | POA: Diagnosis not present

## 2015-10-04 DIAGNOSIS — N2581 Secondary hyperparathyroidism of renal origin: Secondary | ICD-10-CM | POA: Diagnosis not present

## 2015-10-04 DIAGNOSIS — Z283 Underimmunization status: Secondary | ICD-10-CM | POA: Diagnosis not present

## 2015-10-04 DIAGNOSIS — N186 End stage renal disease: Secondary | ICD-10-CM | POA: Diagnosis not present

## 2015-10-04 DIAGNOSIS — D689 Coagulation defect, unspecified: Secondary | ICD-10-CM | POA: Diagnosis not present

## 2015-10-04 DIAGNOSIS — D509 Iron deficiency anemia, unspecified: Secondary | ICD-10-CM | POA: Diagnosis not present

## 2015-10-04 DIAGNOSIS — D631 Anemia in chronic kidney disease: Secondary | ICD-10-CM | POA: Diagnosis not present

## 2015-10-06 DIAGNOSIS — Z283 Underimmunization status: Secondary | ICD-10-CM | POA: Diagnosis not present

## 2015-10-06 DIAGNOSIS — D689 Coagulation defect, unspecified: Secondary | ICD-10-CM | POA: Diagnosis not present

## 2015-10-06 DIAGNOSIS — N186 End stage renal disease: Secondary | ICD-10-CM | POA: Diagnosis not present

## 2015-10-06 DIAGNOSIS — D509 Iron deficiency anemia, unspecified: Secondary | ICD-10-CM | POA: Diagnosis not present

## 2015-10-06 DIAGNOSIS — N2581 Secondary hyperparathyroidism of renal origin: Secondary | ICD-10-CM | POA: Diagnosis not present

## 2015-10-06 DIAGNOSIS — D631 Anemia in chronic kidney disease: Secondary | ICD-10-CM | POA: Diagnosis not present

## 2015-10-08 DIAGNOSIS — Z283 Underimmunization status: Secondary | ICD-10-CM | POA: Diagnosis not present

## 2015-10-08 DIAGNOSIS — D509 Iron deficiency anemia, unspecified: Secondary | ICD-10-CM | POA: Diagnosis not present

## 2015-10-08 DIAGNOSIS — N186 End stage renal disease: Secondary | ICD-10-CM | POA: Diagnosis not present

## 2015-10-08 DIAGNOSIS — N2581 Secondary hyperparathyroidism of renal origin: Secondary | ICD-10-CM | POA: Diagnosis not present

## 2015-10-08 DIAGNOSIS — D689 Coagulation defect, unspecified: Secondary | ICD-10-CM | POA: Diagnosis not present

## 2015-10-08 DIAGNOSIS — D631 Anemia in chronic kidney disease: Secondary | ICD-10-CM | POA: Diagnosis not present

## 2015-10-09 DIAGNOSIS — Z992 Dependence on renal dialysis: Secondary | ICD-10-CM | POA: Diagnosis not present

## 2015-10-09 DIAGNOSIS — E859 Amyloidosis, unspecified: Secondary | ICD-10-CM | POA: Diagnosis not present

## 2015-10-09 DIAGNOSIS — N186 End stage renal disease: Secondary | ICD-10-CM | POA: Diagnosis not present

## 2015-10-11 DIAGNOSIS — N186 End stage renal disease: Secondary | ICD-10-CM | POA: Diagnosis not present

## 2015-10-11 DIAGNOSIS — D689 Coagulation defect, unspecified: Secondary | ICD-10-CM | POA: Diagnosis not present

## 2015-10-11 DIAGNOSIS — N2581 Secondary hyperparathyroidism of renal origin: Secondary | ICD-10-CM | POA: Diagnosis not present

## 2015-10-11 DIAGNOSIS — Z283 Underimmunization status: Secondary | ICD-10-CM | POA: Diagnosis not present

## 2015-10-11 DIAGNOSIS — D631 Anemia in chronic kidney disease: Secondary | ICD-10-CM | POA: Diagnosis not present

## 2015-10-11 DIAGNOSIS — D509 Iron deficiency anemia, unspecified: Secondary | ICD-10-CM | POA: Diagnosis not present

## 2015-10-13 DIAGNOSIS — N2581 Secondary hyperparathyroidism of renal origin: Secondary | ICD-10-CM | POA: Diagnosis not present

## 2015-10-13 DIAGNOSIS — N186 End stage renal disease: Secondary | ICD-10-CM | POA: Diagnosis not present

## 2015-10-13 DIAGNOSIS — D509 Iron deficiency anemia, unspecified: Secondary | ICD-10-CM | POA: Diagnosis not present

## 2015-10-13 DIAGNOSIS — Z283 Underimmunization status: Secondary | ICD-10-CM | POA: Diagnosis not present

## 2015-10-13 DIAGNOSIS — D689 Coagulation defect, unspecified: Secondary | ICD-10-CM | POA: Diagnosis not present

## 2015-10-13 DIAGNOSIS — D631 Anemia in chronic kidney disease: Secondary | ICD-10-CM | POA: Diagnosis not present

## 2015-10-15 DIAGNOSIS — N186 End stage renal disease: Secondary | ICD-10-CM | POA: Diagnosis not present

## 2015-10-15 DIAGNOSIS — D509 Iron deficiency anemia, unspecified: Secondary | ICD-10-CM | POA: Diagnosis not present

## 2015-10-15 DIAGNOSIS — D631 Anemia in chronic kidney disease: Secondary | ICD-10-CM | POA: Diagnosis not present

## 2015-10-15 DIAGNOSIS — D689 Coagulation defect, unspecified: Secondary | ICD-10-CM | POA: Diagnosis not present

## 2015-10-15 DIAGNOSIS — N2581 Secondary hyperparathyroidism of renal origin: Secondary | ICD-10-CM | POA: Diagnosis not present

## 2015-10-15 DIAGNOSIS — Z283 Underimmunization status: Secondary | ICD-10-CM | POA: Diagnosis not present

## 2015-10-18 DIAGNOSIS — D689 Coagulation defect, unspecified: Secondary | ICD-10-CM | POA: Diagnosis not present

## 2015-10-18 DIAGNOSIS — D631 Anemia in chronic kidney disease: Secondary | ICD-10-CM | POA: Diagnosis not present

## 2015-10-18 DIAGNOSIS — D509 Iron deficiency anemia, unspecified: Secondary | ICD-10-CM | POA: Diagnosis not present

## 2015-10-18 DIAGNOSIS — Z283 Underimmunization status: Secondary | ICD-10-CM | POA: Diagnosis not present

## 2015-10-18 DIAGNOSIS — N186 End stage renal disease: Secondary | ICD-10-CM | POA: Diagnosis not present

## 2015-10-18 DIAGNOSIS — N2581 Secondary hyperparathyroidism of renal origin: Secondary | ICD-10-CM | POA: Diagnosis not present

## 2015-10-20 DIAGNOSIS — D509 Iron deficiency anemia, unspecified: Secondary | ICD-10-CM | POA: Diagnosis not present

## 2015-10-20 DIAGNOSIS — D631 Anemia in chronic kidney disease: Secondary | ICD-10-CM | POA: Diagnosis not present

## 2015-10-20 DIAGNOSIS — N186 End stage renal disease: Secondary | ICD-10-CM | POA: Diagnosis not present

## 2015-10-20 DIAGNOSIS — N2581 Secondary hyperparathyroidism of renal origin: Secondary | ICD-10-CM | POA: Diagnosis not present

## 2015-10-20 DIAGNOSIS — Z283 Underimmunization status: Secondary | ICD-10-CM | POA: Diagnosis not present

## 2015-10-20 DIAGNOSIS — D689 Coagulation defect, unspecified: Secondary | ICD-10-CM | POA: Diagnosis not present

## 2015-10-22 DIAGNOSIS — Z283 Underimmunization status: Secondary | ICD-10-CM | POA: Diagnosis not present

## 2015-10-22 DIAGNOSIS — D509 Iron deficiency anemia, unspecified: Secondary | ICD-10-CM | POA: Diagnosis not present

## 2015-10-22 DIAGNOSIS — N186 End stage renal disease: Secondary | ICD-10-CM | POA: Diagnosis not present

## 2015-10-22 DIAGNOSIS — D689 Coagulation defect, unspecified: Secondary | ICD-10-CM | POA: Diagnosis not present

## 2015-10-22 DIAGNOSIS — N2581 Secondary hyperparathyroidism of renal origin: Secondary | ICD-10-CM | POA: Diagnosis not present

## 2015-10-22 DIAGNOSIS — D631 Anemia in chronic kidney disease: Secondary | ICD-10-CM | POA: Diagnosis not present

## 2015-10-25 DIAGNOSIS — D689 Coagulation defect, unspecified: Secondary | ICD-10-CM | POA: Diagnosis not present

## 2015-10-25 DIAGNOSIS — Z283 Underimmunization status: Secondary | ICD-10-CM | POA: Diagnosis not present

## 2015-10-25 DIAGNOSIS — N2581 Secondary hyperparathyroidism of renal origin: Secondary | ICD-10-CM | POA: Diagnosis not present

## 2015-10-25 DIAGNOSIS — D631 Anemia in chronic kidney disease: Secondary | ICD-10-CM | POA: Diagnosis not present

## 2015-10-25 DIAGNOSIS — D509 Iron deficiency anemia, unspecified: Secondary | ICD-10-CM | POA: Diagnosis not present

## 2015-10-25 DIAGNOSIS — N186 End stage renal disease: Secondary | ICD-10-CM | POA: Diagnosis not present

## 2015-10-26 ENCOUNTER — Ambulatory Visit: Payer: Self-pay | Admitting: *Deleted

## 2015-10-26 ENCOUNTER — Other Ambulatory Visit: Payer: Self-pay | Admitting: *Deleted

## 2015-10-26 NOTE — Patient Outreach (Addendum)
[facility]) Care Management  10/26/2015  Shane [name] October 21, 1940 [id number]   Telephone call to patient prior to scheduled visit for today as he has requested, Shane [name] states " I have a Doctors appointment on today, I don't have time for a visit today or anytime, I stay to busy with going to hemodialysis, and doctors appointment. I don't have time for you. "It will be best if you don't come,if you call I may answer and I may not.I am too busy for this on my day off".  Assessment: Patient expressed that he is no longer interested in the community care management  Program Patient not agreeable to rescheduling today's home visit.  Plan I will close case, as patient as has agreed and  send patient MD notification and Mr.Shane [name].  [name], RN, [facility] Care Management [phone]- Mobile [phone]- Toll Free Main Office

## 2015-10-27 ENCOUNTER — Encounter: Payer: Self-pay | Admitting: *Deleted

## 2015-10-27 DIAGNOSIS — N186 End stage renal disease: Secondary | ICD-10-CM | POA: Diagnosis not present

## 2015-10-27 DIAGNOSIS — D689 Coagulation defect, unspecified: Secondary | ICD-10-CM | POA: Diagnosis not present

## 2015-10-27 DIAGNOSIS — N2581 Secondary hyperparathyroidism of renal origin: Secondary | ICD-10-CM | POA: Diagnosis not present

## 2015-10-27 DIAGNOSIS — Z283 Underimmunization status: Secondary | ICD-10-CM | POA: Diagnosis not present

## 2015-10-27 DIAGNOSIS — D631 Anemia in chronic kidney disease: Secondary | ICD-10-CM | POA: Diagnosis not present

## 2015-10-27 DIAGNOSIS — D509 Iron deficiency anemia, unspecified: Secondary | ICD-10-CM | POA: Diagnosis not present

## 2015-10-29 DIAGNOSIS — D631 Anemia in chronic kidney disease: Secondary | ICD-10-CM | POA: Diagnosis not present

## 2015-10-29 DIAGNOSIS — D689 Coagulation defect, unspecified: Secondary | ICD-10-CM | POA: Diagnosis not present

## 2015-10-29 DIAGNOSIS — Z283 Underimmunization status: Secondary | ICD-10-CM | POA: Diagnosis not present

## 2015-10-29 DIAGNOSIS — N186 End stage renal disease: Secondary | ICD-10-CM | POA: Diagnosis not present

## 2015-10-29 DIAGNOSIS — N2581 Secondary hyperparathyroidism of renal origin: Secondary | ICD-10-CM | POA: Diagnosis not present

## 2015-10-29 DIAGNOSIS — D509 Iron deficiency anemia, unspecified: Secondary | ICD-10-CM | POA: Diagnosis not present

## 2015-11-01 DIAGNOSIS — N186 End stage renal disease: Secondary | ICD-10-CM | POA: Diagnosis not present

## 2015-11-01 DIAGNOSIS — D689 Coagulation defect, unspecified: Secondary | ICD-10-CM | POA: Diagnosis not present

## 2015-11-01 DIAGNOSIS — D509 Iron deficiency anemia, unspecified: Secondary | ICD-10-CM | POA: Diagnosis not present

## 2015-11-01 DIAGNOSIS — D631 Anemia in chronic kidney disease: Secondary | ICD-10-CM | POA: Diagnosis not present

## 2015-11-01 DIAGNOSIS — Z283 Underimmunization status: Secondary | ICD-10-CM | POA: Diagnosis not present

## 2015-11-01 DIAGNOSIS — N2581 Secondary hyperparathyroidism of renal origin: Secondary | ICD-10-CM | POA: Diagnosis not present

## 2015-11-03 DIAGNOSIS — N186 End stage renal disease: Secondary | ICD-10-CM | POA: Diagnosis not present

## 2015-11-03 DIAGNOSIS — N2581 Secondary hyperparathyroidism of renal origin: Secondary | ICD-10-CM | POA: Diagnosis not present

## 2015-11-03 DIAGNOSIS — D509 Iron deficiency anemia, unspecified: Secondary | ICD-10-CM | POA: Diagnosis not present

## 2015-11-03 DIAGNOSIS — D631 Anemia in chronic kidney disease: Secondary | ICD-10-CM | POA: Diagnosis not present

## 2015-11-03 DIAGNOSIS — Z283 Underimmunization status: Secondary | ICD-10-CM | POA: Diagnosis not present

## 2015-11-03 DIAGNOSIS — D689 Coagulation defect, unspecified: Secondary | ICD-10-CM | POA: Diagnosis not present

## 2015-11-05 DIAGNOSIS — Z283 Underimmunization status: Secondary | ICD-10-CM | POA: Diagnosis not present

## 2015-11-05 DIAGNOSIS — D689 Coagulation defect, unspecified: Secondary | ICD-10-CM | POA: Diagnosis not present

## 2015-11-05 DIAGNOSIS — D631 Anemia in chronic kidney disease: Secondary | ICD-10-CM | POA: Diagnosis not present

## 2015-11-05 DIAGNOSIS — N186 End stage renal disease: Secondary | ICD-10-CM | POA: Diagnosis not present

## 2015-11-05 DIAGNOSIS — N2581 Secondary hyperparathyroidism of renal origin: Secondary | ICD-10-CM | POA: Diagnosis not present

## 2015-11-05 DIAGNOSIS — D509 Iron deficiency anemia, unspecified: Secondary | ICD-10-CM | POA: Diagnosis not present

## 2015-11-08 DIAGNOSIS — D689 Coagulation defect, unspecified: Secondary | ICD-10-CM | POA: Diagnosis not present

## 2015-11-08 DIAGNOSIS — N2581 Secondary hyperparathyroidism of renal origin: Secondary | ICD-10-CM | POA: Diagnosis not present

## 2015-11-08 DIAGNOSIS — N186 End stage renal disease: Secondary | ICD-10-CM | POA: Diagnosis not present

## 2015-11-08 DIAGNOSIS — D631 Anemia in chronic kidney disease: Secondary | ICD-10-CM | POA: Diagnosis not present

## 2015-11-08 DIAGNOSIS — D509 Iron deficiency anemia, unspecified: Secondary | ICD-10-CM | POA: Diagnosis not present

## 2015-11-08 DIAGNOSIS — Z283 Underimmunization status: Secondary | ICD-10-CM | POA: Diagnosis not present

## 2015-11-09 DIAGNOSIS — E859 Amyloidosis, unspecified: Secondary | ICD-10-CM | POA: Diagnosis not present

## 2015-11-09 DIAGNOSIS — Z992 Dependence on renal dialysis: Secondary | ICD-10-CM | POA: Diagnosis not present

## 2015-11-09 DIAGNOSIS — N186 End stage renal disease: Secondary | ICD-10-CM | POA: Diagnosis not present

## 2015-11-10 DIAGNOSIS — N186 End stage renal disease: Secondary | ICD-10-CM | POA: Diagnosis not present

## 2015-11-10 DIAGNOSIS — Z283 Underimmunization status: Secondary | ICD-10-CM | POA: Diagnosis not present

## 2015-11-10 DIAGNOSIS — D689 Coagulation defect, unspecified: Secondary | ICD-10-CM | POA: Diagnosis not present

## 2015-11-10 DIAGNOSIS — D509 Iron deficiency anemia, unspecified: Secondary | ICD-10-CM | POA: Diagnosis not present

## 2015-11-10 DIAGNOSIS — D631 Anemia in chronic kidney disease: Secondary | ICD-10-CM | POA: Diagnosis not present

## 2015-11-10 DIAGNOSIS — N2581 Secondary hyperparathyroidism of renal origin: Secondary | ICD-10-CM | POA: Diagnosis not present

## 2015-11-12 DIAGNOSIS — D689 Coagulation defect, unspecified: Secondary | ICD-10-CM | POA: Diagnosis not present

## 2015-11-12 DIAGNOSIS — Z283 Underimmunization status: Secondary | ICD-10-CM | POA: Diagnosis not present

## 2015-11-12 DIAGNOSIS — N2581 Secondary hyperparathyroidism of renal origin: Secondary | ICD-10-CM | POA: Diagnosis not present

## 2015-11-12 DIAGNOSIS — D631 Anemia in chronic kidney disease: Secondary | ICD-10-CM | POA: Diagnosis not present

## 2015-11-12 DIAGNOSIS — N186 End stage renal disease: Secondary | ICD-10-CM | POA: Diagnosis not present

## 2015-11-12 DIAGNOSIS — D509 Iron deficiency anemia, unspecified: Secondary | ICD-10-CM | POA: Diagnosis not present

## 2015-11-15 DIAGNOSIS — N2581 Secondary hyperparathyroidism of renal origin: Secondary | ICD-10-CM | POA: Diagnosis not present

## 2015-11-15 DIAGNOSIS — D631 Anemia in chronic kidney disease: Secondary | ICD-10-CM | POA: Diagnosis not present

## 2015-11-15 DIAGNOSIS — Z283 Underimmunization status: Secondary | ICD-10-CM | POA: Diagnosis not present

## 2015-11-15 DIAGNOSIS — D689 Coagulation defect, unspecified: Secondary | ICD-10-CM | POA: Diagnosis not present

## 2015-11-15 DIAGNOSIS — D509 Iron deficiency anemia, unspecified: Secondary | ICD-10-CM | POA: Diagnosis not present

## 2015-11-15 DIAGNOSIS — N186 End stage renal disease: Secondary | ICD-10-CM | POA: Diagnosis not present

## 2015-11-15 NOTE — Pre-Procedure Instructions (Addendum)
NANDAN WILLEMS  11/15/2015      [id number] - [place] - [address] [place] [address] AT [facility] [address] [place] [address] [place] [address] Phone: [phone] Fax: [phone]    Your procedure is scheduled on Thurs, Feb 16 @ 7:30 AM  Report to [facility] Admitting at 5:30 AM  Call this number if you have problems the morning of surgery:  [phone]   Remember:  Do not eat food or drink liquids after midnight.  Take these medicines the morning of surgery with A SIP OF WATER Albuterol<Bring Your Inhaler With You>,Amlodipine(Norvasc),Proscar(Finasteride),Metoprolol(Toprol),and Tamsulosin(Flomax)              No Goody's,BC's,Aleve,Aspirin,Ibuprofen,Motrin,Advil,Fish Oil,or any Herbal Medications.    Do not wear jewelry.  Do not wear lotions, powders, or colognes.  You may wear deodorant.  Men may shave face and neck.  Do not bring valuables to the hospital.  [facility] is not responsible for any belongings or valuables.  Contacts, dentures or bridgework may not be worn into surgery.  Leave your suitcase in the car.  After surgery it may be brought to your room.  For patients admitted to the hospital, discharge time will be determined by your treatment team.  Patients discharged the day of surgery will not be allowed to drive home.    Special instructions:   - Preparing for Surgery  Before surgery, you can play an important role.  Because skin is not sterile, your skin needs to be as free of germs as possible.  You can reduce the number of germs on you skin by washing with CHG (chlorahexidine gluconate) soap before surgery.  CHG is an antiseptic cleaner which kills germs and bonds with the skin to continue killing germs even after washing.  Please DO NOT use if you have an allergy to CHG or antibacterial soaps.  If your skin becomes reddened/irritated stop using the CHG and inform your nurse when you arrive at Short  Stay.  Do not shave (including legs and underarms) for at least 48 hours prior to the first CHG shower.  You may shave your face.  Please follow these instructions carefully:   1.  Shower with CHG Soap the night before surgery and the                                morning of Surgery.  2.  If you choose to wash your hair, wash your hair first as usual with your       normal shampoo.  3.  After you shampoo, rinse your hair and body thoroughly to remove the                      Shampoo.  4.  Use CHG as you would any other liquid soap.  You can apply chg directly       to the skin and wash gently with scrungie or a clean washcloth.  5.  Apply the CHG Soap to your body ONLY FROM THE NECK DOWN.        Do not use on open wounds or open sores.  Avoid contact with your eyes,       ears, mouth and genitals (private parts).  Wash genitals (private parts)       with your normal soap.  6.  Wash thoroughly, paying special attention to the  area where your surgery        will be performed.  7.  Thoroughly rinse your body with warm water from the neck down.  8.  DO NOT shower/wash with your normal soap after using and rinsing off       the CHG Soap.  9.  Pat yourself dry with a clean towel.            10.  Wear clean pajamas.            11.  Place clean sheets on your bed the night of your first shower and do not        sleep with pets.  Day of Surgery  Do not apply any lotions/deoderants the morning of surgery.  Please wear clean clothes to the hospital/surgery center.    Please read over the following fact sheets that you were given. Pain Booklet, Coughing and Deep Breathing and Surgical Site Infection Prevention

## 2015-11-16 ENCOUNTER — Encounter (HOSPITAL_COMMUNITY)
Admission: RE | Admit: 2015-11-16 | Discharge: 2015-11-16 | Disposition: A | Discharge status: Home / Self Care | Payer: Medicare Other | Source: Ambulatory Visit | Attending: Surgery | Admitting: Surgery

## 2015-11-16 ENCOUNTER — Encounter (HOSPITAL_COMMUNITY): Payer: Self-pay

## 2015-11-16 DIAGNOSIS — K4091 Unilateral inguinal hernia, without obstruction or gangrene, recurrent: Secondary | ICD-10-CM | POA: Insufficient documentation

## 2015-11-16 DIAGNOSIS — N186 End stage renal disease: Secondary | ICD-10-CM | POA: Insufficient documentation

## 2015-11-16 DIAGNOSIS — Z87891 Personal history of nicotine dependence: Secondary | ICD-10-CM | POA: Insufficient documentation

## 2015-11-16 DIAGNOSIS — Z79899 Other long term (current) drug therapy: Secondary | ICD-10-CM | POA: Insufficient documentation

## 2015-11-16 DIAGNOSIS — Z01812 Encounter for preprocedural laboratory examination: Secondary | ICD-10-CM | POA: Insufficient documentation

## 2015-11-16 DIAGNOSIS — Z01818 Encounter for other preprocedural examination: Secondary | ICD-10-CM | POA: Insufficient documentation

## 2015-11-16 DIAGNOSIS — I120 Hypertensive chronic kidney disease with stage 5 chronic kidney disease or end stage renal disease: Secondary | ICD-10-CM | POA: Diagnosis not present

## 2015-11-16 DIAGNOSIS — Z992 Dependence on renal dialysis: Secondary | ICD-10-CM | POA: Insufficient documentation

## 2015-11-16 DIAGNOSIS — E859 Amyloidosis, unspecified: Secondary | ICD-10-CM | POA: Diagnosis not present

## 2015-11-16 DIAGNOSIS — Z85528 Personal history of other malignant neoplasm of kidney: Secondary | ICD-10-CM | POA: Diagnosis not present

## 2015-11-16 HISTORY — DX: Presence of dental prosthetic device (complete) (partial): Z97.2

## 2015-11-16 HISTORY — DX: Complete loss of teeth, unspecified cause, unspecified class: K08.109

## 2015-11-16 LAB — BASIC METABOLIC PANEL
Anion gap: 14 (ref 5–15)
BUN: 23 mg/dL — AB (ref 6–20)
CALCIUM: 9.8 mg/dL (ref 8.9–10.3)
CO2: 27 mmol/L (ref 22–32)
CREATININE: 6.21 mg/dL — AB (ref 0.61–1.24)
Chloride: 101 mmol/L (ref 101–111)
GFR calc non Af Amer: 8 mL/min — ABNORMAL LOW (ref >60)
GFR, EST AFRICAN AMERICAN: 9 mL/min — AB (ref >60)
Glucose, Bld: 78 mg/dL (ref 65–99)
Potassium: 4.6 mmol/L (ref 3.5–5.1)
SODIUM: 142 mmol/L (ref 135–145)

## 2015-11-16 LAB — CBC
HCT: 35.9 % — ABNORMAL LOW (ref 39.0–52.0)
Hemoglobin: 11.8 g/dL — ABNORMAL LOW (ref 13.0–17.0)
MCH: 33.1 pg (ref 26.0–34.0)
MCHC: 32.9 g/dL (ref 30.0–36.0)
MCV: 100.6 fL — AB (ref 78.0–100.0)
Platelets: 175 10*3/uL (ref 150–400)
RBC: 3.57 MIL/uL — AB (ref 4.22–5.81)
RDW: 13.5 % (ref 11.5–15.5)
WBC: 7.5 10*3/uL (ref 4.0–10.5)

## 2015-11-16 NOTE — Progress Notes (Signed)
Patient has HD MWF at [facility].  He sees Dr. Reynolds Bowl for nephrology.    Patient denies ever having any cardiac testing or ever following up with a cardiologist for any reason.  He has had ECHOs done in 2011, 2014, and 2016 that can be located in [place], and EKG from 08/01/15 also in [place].  He states he had a 'spell' last fall where he was told he was fluid overloaded and retains an inhaler as needed for this, but has not needed to use it lately.  This was when he started HD and it has helped (per pt).    Patient's PCP is [name], MD at [facility].

## 2015-11-17 DIAGNOSIS — D509 Iron deficiency anemia, unspecified: Secondary | ICD-10-CM | POA: Diagnosis not present

## 2015-11-17 DIAGNOSIS — Z283 Underimmunization status: Secondary | ICD-10-CM | POA: Diagnosis not present

## 2015-11-17 DIAGNOSIS — N186 End stage renal disease: Secondary | ICD-10-CM | POA: Diagnosis not present

## 2015-11-17 DIAGNOSIS — D631 Anemia in chronic kidney disease: Secondary | ICD-10-CM | POA: Diagnosis not present

## 2015-11-17 DIAGNOSIS — N2581 Secondary hyperparathyroidism of renal origin: Secondary | ICD-10-CM | POA: Diagnosis not present

## 2015-11-17 DIAGNOSIS — D689 Coagulation defect, unspecified: Secondary | ICD-10-CM | POA: Diagnosis not present

## 2015-11-18 NOTE — Progress Notes (Signed)
Anesthesia Chart Review: Patient is a 75 year old male scheduled for laparoscopic exploration and repair of hernia in groin on 11/25/15 by Dr. Michaell Cowing. Had ED visit 09/01/15 to reduced recurrent right inguinal hernia.  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 PCP is Dr. Burnell Blanks at [facility]. Nephrologist is Dr. Arrie Aran. It looks like he has a pending referral to the [facility]. Last cardiology visit seen in Epic is from 04/02/13 with Dr. 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 MRI did not show any high risk features for cardiac amyloidosis.  Meds include Combivent, amlodipine, Renal vitamin, Phoslo, finasteride, Lasix, Toprol XL, Flomax.  08/01/15 EKG: ST at 104, right BBB, possible inferior infarct (age undetermined). Overall, I think his EKG is stable when compared to prior EKGs dating back to at least 02/09/13.  02/11/13 Echo: Study Conclusions - Left ventricle: There was mild concentric hypertrophywith moderate septal hypertrophy. The septum is bright and speckled, suggestive of possible cardiac amyloid - additional echo strain imaging or cardiac MRI may be helpful in making the diagnosis. Strain imaging may help better delineate the regional wall motion abnormalities. Systolic function was mildly reduced. The estimated ejection fraction was in the range of 45% to 50%. There is inferoposterior hypokinesis. Doppler parameters are consistent with abnormal left ventricular relaxation (grade 1 diastolic dysfunction). The E/e' ratio is >10, suggesting elevated LV filling pressure. - Mitral valve: Mildly thickened leaflets .  Mild regurgitation. - Left atrium: The atrium was normal in size. - Tricuspid valve: Mild regurgitation. - Pulmonary arteries: PA peak pressure: 34mm Hg (S). - Inferior vena cava: The vessel was normal in size; the respirophasic diameter changes were in the normal range (= 50%); findings are consistent with normal central venous pressure.  Patient was subsequently referred to cardiologist Dr. 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 Cardiac MRI (without gadolinium due to CKD) report on 05/05/13 showed: Impression: 1) Mild LVH Normal EF 62% no RWMA;s 2) Unable to assess amyloid by abnormal nulling of myocardium post gadolinium due to CRF. However morphologic features of LV appear benign and uniform on T1 weighted images  3) Mild LAE Dr. Eden Emms felt there were no high risk features for amyloid in the heart based on these results.   08/03/15 1V CXR (done days before starting hemodialysis): IMPRESSION: Slight increase in consolidation left base. Areas of consolidation on the right are stable. There are bilateral pleural effusions which appear stable. Cardiomegaly is also stable  Preoperative labs noted. H/H 11.8/35.9. K 4.6. Cr 6.21 (on hemodialysis). He needs an ISTAT4 on the day of surgery.   Reviewed above with anesthesiologist Dr. Okey Dupre. If labs are acceptable and no acute CV/CHF issues then anticipate that he can proceed as planned.  Velna Ochs [facility] Short Stay Center/Anesthesiology Phone [phone] 11/18/2015 6:06 PM

## 2015-11-19 DIAGNOSIS — N186 End stage renal disease: Secondary | ICD-10-CM | POA: Diagnosis not present

## 2015-11-19 DIAGNOSIS — Z283 Underimmunization status: Secondary | ICD-10-CM | POA: Diagnosis not present

## 2015-11-19 DIAGNOSIS — D631 Anemia in chronic kidney disease: Secondary | ICD-10-CM | POA: Diagnosis not present

## 2015-11-19 DIAGNOSIS — D689 Coagulation defect, unspecified: Secondary | ICD-10-CM | POA: Diagnosis not present

## 2015-11-19 DIAGNOSIS — N2581 Secondary hyperparathyroidism of renal origin: Secondary | ICD-10-CM | POA: Diagnosis not present

## 2015-11-19 DIAGNOSIS — D509 Iron deficiency anemia, unspecified: Secondary | ICD-10-CM | POA: Diagnosis not present

## 2015-11-22 DIAGNOSIS — Z283 Underimmunization status: Secondary | ICD-10-CM | POA: Diagnosis not present

## 2015-11-22 DIAGNOSIS — D509 Iron deficiency anemia, unspecified: Secondary | ICD-10-CM | POA: Diagnosis not present

## 2015-11-22 DIAGNOSIS — N186 End stage renal disease: Secondary | ICD-10-CM | POA: Diagnosis not present

## 2015-11-22 DIAGNOSIS — N2581 Secondary hyperparathyroidism of renal origin: Secondary | ICD-10-CM | POA: Diagnosis not present

## 2015-11-22 DIAGNOSIS — D689 Coagulation defect, unspecified: Secondary | ICD-10-CM | POA: Diagnosis not present

## 2015-11-22 DIAGNOSIS — D631 Anemia in chronic kidney disease: Secondary | ICD-10-CM | POA: Diagnosis not present

## 2015-11-24 DIAGNOSIS — D689 Coagulation defect, unspecified: Secondary | ICD-10-CM | POA: Diagnosis not present

## 2015-11-24 DIAGNOSIS — Z283 Underimmunization status: Secondary | ICD-10-CM | POA: Diagnosis not present

## 2015-11-24 DIAGNOSIS — D509 Iron deficiency anemia, unspecified: Secondary | ICD-10-CM | POA: Diagnosis not present

## 2015-11-24 DIAGNOSIS — N2581 Secondary hyperparathyroidism of renal origin: Secondary | ICD-10-CM | POA: Diagnosis not present

## 2015-11-24 DIAGNOSIS — D631 Anemia in chronic kidney disease: Secondary | ICD-10-CM | POA: Diagnosis not present

## 2015-11-24 DIAGNOSIS — N186 End stage renal disease: Secondary | ICD-10-CM | POA: Diagnosis not present

## 2015-11-24 MED ORDER — CHLORHEXIDINE GLUCONATE 4 % EX LIQD: 1.0000 "application " | Freq: Once | CUTANEOUS | Status: DC

## 2015-11-24 MED ORDER — CEFAZOLIN SODIUM-DEXTROSE 2-3 GM-% IV SOLR
2.0000 g | INTRAVENOUS | Status: AC
  Administered 2015-11-25: 2 g via INTRAVENOUS
  Filled 2015-11-24: qty 50

## 2015-11-24 NOTE — Anesthesia Preprocedure Evaluation (Addendum)
Anesthesia Evaluation  Patient identified by MRN, date of birth, ID band Patient awake    Reviewed: Allergy & Precautions, Patient's Chart, lab work & pertinent test results  History of Anesthesia Complications Negative for: history of anesthetic complications  Airway Mallampati: II   Neck ROM: Full    Dental  (+) Dental Advisory Given, Edentulous Upper, Edentulous Lower   Pulmonary Current Smoker,    Pulmonary exam normal        Cardiovascular hypertension, Normal cardiovascular exam     Neuro/Psych negative neurological ROS  negative psych ROS   GI/Hepatic   Endo/Other  negative endocrine ROS  Renal/GU DialysisRenal disease     Musculoskeletal   Abdominal   Peds  Hematology   Anesthesia Other Findings   Reproductive/Obstetrics                            Anesthesia Physical Anesthesia Plan  ASA: III  Anesthesia Plan: General   Post-op Pain Management:    Induction: Intravenous  Airway Management Planned: Oral ETT  Additional Equipment:   Intra-op Plan:   Post-operative Plan: Extubation in OR  Informed Consent: I have reviewed the patients History and Physical, chart, labs and discussed the procedure including the risks, benefits and alternatives for the proposed anesthesia with the patient or authorized representative who has indicated his/her understanding and acceptance.   Dental advisory given  Plan Discussed with: Anesthesiologist  Anesthesia Plan Comments:        Anesthesia Quick Evaluation

## 2015-11-25 ENCOUNTER — Inpatient Hospital Stay (HOSPITAL_COMMUNITY)
Admission: RE | Admit: 2015-11-25 | Discharge: 2015-12-01 | DRG: 350 | Disposition: A | Discharge status: Home / Self Care | Payer: Medicare Other | Source: Ambulatory Visit | Attending: Internal Medicine | Admitting: Internal Medicine

## 2015-11-25 ENCOUNTER — Ambulatory Visit (HOSPITAL_COMMUNITY): Payer: Medicare Other | Admitting: Anesthesiology

## 2015-11-25 ENCOUNTER — Encounter (HOSPITAL_COMMUNITY): Payer: Self-pay | Admitting: Surgery

## 2015-11-25 ENCOUNTER — Encounter (HOSPITAL_COMMUNITY)
Admission: RE | Disposition: A | Discharge status: Home / Self Care | Payer: Self-pay | Source: Ambulatory Visit | Attending: Surgery

## 2015-11-25 ENCOUNTER — Ambulatory Visit (HOSPITAL_COMMUNITY): Payer: Medicare Other | Admitting: Vascular Surgery

## 2015-11-25 DIAGNOSIS — N433 Hydrocele, unspecified: Secondary | ICD-10-CM | POA: Diagnosis not present

## 2015-11-25 DIAGNOSIS — K4090 Unilateral inguinal hernia, without obstruction or gangrene, not specified as recurrent: Secondary | ICD-10-CM | POA: Diagnosis not present

## 2015-11-25 DIAGNOSIS — E859 Amyloidosis, unspecified: Secondary | ICD-10-CM | POA: Diagnosis not present

## 2015-11-25 DIAGNOSIS — N186 End stage renal disease: Secondary | ICD-10-CM

## 2015-11-25 DIAGNOSIS — K4020 Bilateral inguinal hernia, without obstruction or gangrene, not specified as recurrent: Secondary | ICD-10-CM | POA: Diagnosis not present

## 2015-11-25 DIAGNOSIS — G9340 Encephalopathy, unspecified: Secondary | ICD-10-CM | POA: Diagnosis not present

## 2015-11-25 DIAGNOSIS — A419 Sepsis, unspecified organism: Secondary | ICD-10-CM | POA: Diagnosis not present

## 2015-11-25 DIAGNOSIS — N189 Chronic kidney disease, unspecified: Secondary | ICD-10-CM | POA: Diagnosis not present

## 2015-11-25 DIAGNOSIS — R410 Disorientation, unspecified: Secondary | ICD-10-CM

## 2015-11-25 DIAGNOSIS — G92 Toxic encephalopathy: Secondary | ICD-10-CM | POA: Diagnosis present

## 2015-11-25 DIAGNOSIS — I120 Hypertensive chronic kidney disease with stage 5 chronic kidney disease or end stage renal disease: Secondary | ICD-10-CM | POA: Diagnosis not present

## 2015-11-25 DIAGNOSIS — I517 Cardiomegaly: Secondary | ICD-10-CM | POA: Diagnosis not present

## 2015-11-25 DIAGNOSIS — D631 Anemia in chronic kidney disease: Secondary | ICD-10-CM | POA: Diagnosis not present

## 2015-11-25 DIAGNOSIS — Z720 Tobacco use: Secondary | ICD-10-CM | POA: Diagnosis present

## 2015-11-25 DIAGNOSIS — N401 Enlarged prostate with lower urinary tract symptoms: Secondary | ICD-10-CM | POA: Diagnosis present

## 2015-11-25 DIAGNOSIS — I10 Essential (primary) hypertension: Secondary | ICD-10-CM | POA: Diagnosis not present

## 2015-11-25 DIAGNOSIS — Z992 Dependence on renal dialysis: Secondary | ICD-10-CM

## 2015-11-25 DIAGNOSIS — Z7189 Other specified counseling: Secondary | ICD-10-CM

## 2015-11-25 DIAGNOSIS — G928 Other toxic encephalopathy: Secondary | ICD-10-CM | POA: Diagnosis present

## 2015-11-25 HISTORY — PX: INGUINAL HERNIA REPAIR: SHX194

## 2015-11-25 HISTORY — DX: Enterococcus as the cause of diseases classified elsewhere: B95.2

## 2015-11-25 HISTORY — DX: Urinary tract infection, site not specified: N39.0

## 2015-11-25 HISTORY — DX: Bilateral inguinal hernia, without obstruction or gangrene, not specified as recurrent: K40.20

## 2015-11-25 HISTORY — DX: Other specified abnormal findings of blood chemistry: R79.89

## 2015-11-25 HISTORY — PX: INSERTION OF MESH: SHX5868

## 2015-11-25 LAB — CBC
HCT: 34.3 % — ABNORMAL LOW (ref 39.0–52.0)
Hemoglobin: 11.1 g/dL — ABNORMAL LOW (ref 13.0–17.0)
MCH: 33.2 pg (ref 26.0–34.0)
MCHC: 32.4 g/dL (ref 30.0–36.0)
MCV: 102.7 fL — ABNORMAL HIGH (ref 78.0–100.0)
Platelets: 163 10*3/uL (ref 150–400)
RBC: 3.34 MIL/uL — ABNORMAL LOW (ref 4.22–5.81)
RDW: 14 % (ref 11.5–15.5)
WBC: 9.8 10*3/uL (ref 4.0–10.5)

## 2015-11-25 LAB — RENAL FUNCTION PANEL
Albumin: 3.3 g/dL — ABNORMAL LOW (ref 3.5–5.0)
Anion gap: 16 — ABNORMAL HIGH (ref 5–15)
BUN: 24 mg/dL — ABNORMAL HIGH (ref 6–20)
CO2: 25 mmol/L (ref 22–32)
Calcium: 9.3 mg/dL (ref 8.9–10.3)
Chloride: 100 mmol/L — ABNORMAL LOW (ref 101–111)
Creatinine, Ser: 6.61 mg/dL — ABNORMAL HIGH (ref 0.61–1.24)
GFR calc Af Amer: 9 mL/min — ABNORMAL LOW (ref >60)
GFR calc non Af Amer: 7 mL/min — ABNORMAL LOW (ref >60)
Glucose, Bld: 96 mg/dL (ref 65–99)
Phosphorus: 6.2 mg/dL — ABNORMAL HIGH (ref 2.5–4.6)
Potassium: 5.4 mmol/L — ABNORMAL HIGH (ref 3.5–5.1)
Sodium: 141 mmol/L (ref 135–145)

## 2015-11-25 LAB — POCT I-STAT 4, (NA,K, GLUC, HGB,HCT)
GLUCOSE: 85 mg/dL (ref 65–99)
HEMATOCRIT: 34 % — AB (ref 39.0–52.0)
HEMOGLOBIN: 11.6 g/dL — AB (ref 13.0–17.0)
Potassium: 4.2 mmol/L (ref 3.5–5.1)
Sodium: 141 mmol/L (ref 135–145)

## 2015-11-25 SURGERY — REPAIR, HERNIA, INGUINAL, BILATERAL, LAPAROSCOPIC
Anesthesia: General | Site: Groin | Laterality: Bilateral

## 2015-11-25 MED ORDER — FENTANYL CITRATE (PF) 100 MCG/2ML IJ SOLN
INTRAMUSCULAR | Status: DC | PRN
  Administered 2015-11-25 (×2): 50 ug via INTRAVENOUS
  Administered 2015-11-25 (×2): 25 ug via INTRAVENOUS

## 2015-11-25 MED ORDER — ACETAMINOPHEN 500 MG PO TABS
1000.0000 mg | ORAL_TABLET | Freq: Three times a day (TID) | ORAL | Status: DC
  Administered 2015-11-25 – 2015-11-29 (×6): 1000 mg via ORAL
  Filled 2015-11-25 (×10): qty 2

## 2015-11-25 MED ORDER — SODIUM CHLORIDE 0.9 % IR SOLN
Status: DC | PRN
  Administered 2015-11-25: 1000 mL

## 2015-11-25 MED ORDER — SODIUM CHLORIDE 0.9 % IJ SOLN
INTRAMUSCULAR | Status: AC
  Filled 2015-11-25: qty 10

## 2015-11-25 MED ORDER — PROPOFOL 10 MG/ML IV BOLUS
INTRAVENOUS | Status: AC
  Filled 2015-11-25: qty 20

## 2015-11-25 MED ORDER — ONDANSETRON HCL 4 MG PO TABS: 4.0000 mg | ORAL_TABLET | Freq: Four times a day (QID) | ORAL | Status: DC | PRN

## 2015-11-25 MED ORDER — CALCIUM ACETATE (PHOS BINDER) 667 MG PO CAPS
2001.0000 mg | ORAL_CAPSULE | Freq: Three times a day (TID) | ORAL | Status: DC
  Administered 2015-11-25 – 2015-12-01 (×9): 2001 mg via ORAL
  Filled 2015-11-25 (×20): qty 3

## 2015-11-25 MED ORDER — SODIUM CHLORIDE 0.9% FLUSH
3.0000 mL | Freq: Two times a day (BID) | INTRAVENOUS | Status: DC
  Administered 2015-11-25 – 2015-11-30 (×2): 3 mL via INTRAVENOUS

## 2015-11-25 MED ORDER — MIDAZOLAM HCL 2 MG/2ML IJ SOLN
INTRAMUSCULAR | Status: AC
  Filled 2015-11-25: qty 2

## 2015-11-25 MED ORDER — NEOSTIGMINE METHYLSULFATE 10 MG/10ML IV SOLN
INTRAVENOUS | Status: AC
  Filled 2015-11-25: qty 1

## 2015-11-25 MED ORDER — DOXERCALCIFEROL 4 MCG/2ML IV SOLN
2.0000 ug | INTRAVENOUS | Status: DC
  Administered 2015-11-26 – 2015-12-01 (×2): 2 ug via INTRAVENOUS
  Filled 2015-11-25 (×2): qty 2

## 2015-11-25 MED ORDER — CALCIUM ACETATE (PHOS BINDER) 667 MG PO CAPS
2001.0000 mg | ORAL_CAPSULE | Freq: Three times a day (TID) | ORAL | Status: DC
  Filled 2015-11-25 (×2): qty 3

## 2015-11-25 MED ORDER — GLYCOPYRROLATE 0.2 MG/ML IJ SOLN
INTRAMUSCULAR | Status: AC
  Filled 2015-11-25: qty 2

## 2015-11-25 MED ORDER — ENOXAPARIN SODIUM 30 MG/0.3ML ~~LOC~~ SOLN
30.0000 mg | SUBCUTANEOUS | Status: DC
  Administered 2015-11-27 – 2015-12-01 (×4): 30 mg via SUBCUTANEOUS
  Filled 2015-11-25 (×4): qty 0.3

## 2015-11-25 MED ORDER — ROCURONIUM BROMIDE 50 MG/5ML IV SOLN
INTRAVENOUS | Status: AC
  Filled 2015-11-25: qty 1

## 2015-11-25 MED ORDER — LIDOCAINE-PRILOCAINE 2.5-2.5 % EX CREA
1.0000 "application " | TOPICAL_CREAM | CUTANEOUS | Status: DC | PRN
  Filled 2015-11-25: qty 5

## 2015-11-25 MED ORDER — ONDANSETRON 4 MG PO TBDP
4.0000 mg | ORAL_TABLET | Freq: Four times a day (QID) | ORAL | Status: DC | PRN
Start: 2015-11-25 — End: 2015-11-29

## 2015-11-25 MED ORDER — VECURONIUM BROMIDE 10 MG IV SOLR
INTRAVENOUS | Status: AC
  Filled 2015-11-25: qty 10

## 2015-11-25 MED ORDER — SCOPOLAMINE 1 MG/3DAYS TD PT72
MEDICATED_PATCH | TRANSDERMAL | Status: AC
  Administered 2015-11-25: 1.5 mg via TRANSDERMAL
  Filled 2015-11-25: qty 1

## 2015-11-25 MED ORDER — SUCCINYLCHOLINE CHLORIDE 20 MG/ML IJ SOLN
INTRAMUSCULAR | Status: AC
  Filled 2015-11-25: qty 1

## 2015-11-25 MED ORDER — LACTATED RINGERS IV BOLUS (SEPSIS): 1000.0000 mL | Freq: Three times a day (TID) | INTRAVENOUS | Status: DC | PRN

## 2015-11-25 MED ORDER — IPRATROPIUM-ALBUTEROL 0.5-2.5 (3) MG/3ML IN SOLN: 3.0000 mL | Freq: Four times a day (QID) | RESPIRATORY_TRACT | Status: DC | PRN

## 2015-11-25 MED ORDER — METOPROLOL SUCCINATE ER 50 MG PO TB24: 50.0000 mg | ORAL_TABLET | Freq: Every day | ORAL | Status: DC

## 2015-11-25 MED ORDER — PHENYLEPHRINE 40 MCG/ML (10ML) SYRINGE FOR IV PUSH (FOR BLOOD PRESSURE SUPPORT)
PREFILLED_SYRINGE | INTRAVENOUS | Status: AC
  Filled 2015-11-25: qty 10

## 2015-11-25 MED ORDER — BUPIVACAINE-EPINEPHRINE (PF) 0.25% -1:200000 IJ SOLN
INTRAMUSCULAR | Status: AC
  Filled 2015-11-25: qty 30

## 2015-11-25 MED ORDER — GLYCOPYRROLATE 0.2 MG/ML IJ SOLN
INTRAMUSCULAR | Status: DC | PRN
  Administered 2015-11-25: 0.4 mg via INTRAVENOUS

## 2015-11-25 MED ORDER — BISACODYL 10 MG RE SUPP: 10.0000 mg | Freq: Two times a day (BID) | RECTAL | Status: DC | PRN

## 2015-11-25 MED ORDER — TRAMADOL HCL 50 MG PO TABS
50.0000 mg | ORAL_TABLET | Freq: Once | ORAL | Status: AC
  Administered 2015-11-25: 50 mg via ORAL

## 2015-11-25 MED ORDER — DOXYCYCLINE HYCLATE 100 MG PO TABS
100.0000 mg | ORAL_TABLET | Freq: Two times a day (BID) | ORAL | Status: DC
  Administered 2015-11-25 – 2015-11-28 (×4): 100 mg via ORAL
  Filled 2015-11-25 (×6): qty 1

## 2015-11-25 MED ORDER — RENA-VITE PO TABS
1.0000 | ORAL_TABLET | Freq: Every day | ORAL | Status: DC
  Administered 2015-11-25 – 2015-11-30 (×5): 1 via ORAL
  Filled 2015-11-25 (×7): qty 1

## 2015-11-25 MED ORDER — SODIUM CHLORIDE 0.9 % IV SOLN
INTRAVENOUS | Status: DC | PRN
  Administered 2015-11-25: 07:00:00 via INTRAVENOUS

## 2015-11-25 MED ORDER — TRAMADOL HCL 50 MG PO TABS: 50.0000 mg | ORAL_TABLET | Freq: Four times a day (QID) | ORAL | Status: DC | PRN

## 2015-11-25 MED ORDER — SODIUM CHLORIDE 0.9 % IV SOLN: 100.0000 mL | INTRAVENOUS | Status: DC | PRN

## 2015-11-25 MED ORDER — PENTAFLUOROPROP-TETRAFLUOROETH EX AERO
1.0000 "application " | INHALATION_SPRAY | CUTANEOUS | Status: DC | PRN
  Filled 2015-11-25: qty 30

## 2015-11-25 MED ORDER — PHENOL 1.4 % MT LIQD: 2.0000 | OROMUCOSAL | Status: DC | PRN

## 2015-11-25 MED ORDER — SCOPOLAMINE 1 MG/3DAYS TD PT72
1.0000 | MEDICATED_PATCH | Freq: Once | TRANSDERMAL | Status: DC
  Administered 2015-11-25: 1.5 mg via TRANSDERMAL

## 2015-11-25 MED ORDER — ACETAMINOPHEN 10 MG/ML IV SOLN
INTRAVENOUS | Status: AC
  Filled 2015-11-25: qty 100

## 2015-11-25 MED ORDER — ACETAMINOPHEN 10 MG/ML IV SOLN
INTRAVENOUS | Status: DC | PRN
  Administered 2015-11-25: 1000 mg via INTRAVENOUS

## 2015-11-25 MED ORDER — HYDROMORPHONE HCL 1 MG/ML IJ SOLN: 0.5000 mg | INTRAMUSCULAR | Status: DC | PRN

## 2015-11-25 MED ORDER — FENTANYL CITRATE (PF) 250 MCG/5ML IJ SOLN
INTRAMUSCULAR | Status: AC
  Filled 2015-11-25: qty 5

## 2015-11-25 MED ORDER — SODIUM CHLORIDE 0.9 % IV SOLN
62.5000 mg | INTRAVENOUS | Status: DC
  Administered 2015-11-26 – 2015-12-01 (×2): 62.5 mg via INTRAVENOUS
  Filled 2015-11-25 (×3): qty 5

## 2015-11-25 MED ORDER — PROPOFOL 10 MG/ML IV BOLUS
INTRAVENOUS | Status: DC | PRN
  Administered 2015-11-25: 120 mg via INTRAVENOUS

## 2015-11-25 MED ORDER — LIDOCAINE HCL (CARDIAC) 20 MG/ML IV SOLN
INTRAVENOUS | Status: DC | PRN
  Administered 2015-11-25: 50 mg via INTRAVENOUS

## 2015-11-25 MED ORDER — PHENYLEPHRINE HCL 10 MG/ML IJ SOLN
INTRAMUSCULAR | Status: DC | PRN
  Administered 2015-11-25 (×3): 40 ug via INTRAVENOUS
  Administered 2015-11-25: 80 ug via INTRAVENOUS
  Administered 2015-11-25: 40 ug via INTRAVENOUS

## 2015-11-25 MED ORDER — MAGIC MOUTHWASH: 15.0000 mL | Freq: Four times a day (QID) | ORAL | Status: DC | PRN

## 2015-11-25 MED ORDER — CEFAZOLIN SODIUM-DEXTROSE 2-3 GM-% IV SOLR: 2.0000 g | Freq: Three times a day (TID) | INTRAVENOUS | Status: DC

## 2015-11-25 MED ORDER — EPHEDRINE SULFATE 50 MG/ML IJ SOLN
INTRAMUSCULAR | Status: AC
  Filled 2015-11-25: qty 1

## 2015-11-25 MED ORDER — IPRATROPIUM-ALBUTEROL 18-103 MCG/ACT IN AERO: 2.0000 | INHALATION_SPRAY | Freq: Four times a day (QID) | RESPIRATORY_TRACT | Status: DC | PRN

## 2015-11-25 MED ORDER — 0.9 % SODIUM CHLORIDE (POUR BTL) OPTIME
TOPICAL | Status: DC | PRN
  Administered 2015-11-25: 1000 mL

## 2015-11-25 MED ORDER — METOPROLOL SUCCINATE ER 50 MG PO TB24
50.0000 mg | ORAL_TABLET | Freq: Every day | ORAL | Status: DC
  Administered 2015-11-26 – 2015-11-30 (×4): 50 mg via ORAL
  Filled 2015-11-25 (×4): qty 1

## 2015-11-25 MED ORDER — LIDOCAINE HCL (PF) 1 % IJ SOLN: 5.0000 mL | INTRAMUSCULAR | Status: DC | PRN

## 2015-11-25 MED ORDER — SODIUM CHLORIDE 0.9% FLUSH
3.0000 mL | INTRAVENOUS | Status: DC | PRN
Start: 2015-11-25 — End: 2015-11-30

## 2015-11-25 MED ORDER — SODIUM CHLORIDE 0.9 % IV SOLN: 250.0000 mL | INTRAVENOUS | Status: DC | PRN

## 2015-11-25 MED ORDER — TAMSULOSIN HCL 0.4 MG PO CAPS
0.4000 mg | ORAL_CAPSULE | Freq: Two times a day (BID) | ORAL | Status: DC
  Administered 2015-11-25 – 2015-11-29 (×5): 0.4 mg via ORAL
  Filled 2015-11-25 (×7): qty 1

## 2015-11-25 MED ORDER — METOPROLOL SUCCINATE ER 50 MG PO TB24
50.0000 mg | ORAL_TABLET | Freq: Once | ORAL | Status: AC
  Administered 2015-11-25: 50 mg via ORAL
  Filled 2015-11-25: qty 1

## 2015-11-25 MED ORDER — LIDOCAINE HCL (CARDIAC) 20 MG/ML IV SOLN
INTRAVENOUS | Status: AC
  Filled 2015-11-25: qty 5

## 2015-11-25 MED ORDER — LIP MEDEX EX OINT
1.0000 "application " | TOPICAL_OINTMENT | Freq: Two times a day (BID) | CUTANEOUS | Status: DC
  Filled 2015-11-25: qty 7

## 2015-11-25 MED ORDER — AMLODIPINE BESYLATE 10 MG PO TABS: 10.0000 mg | ORAL_TABLET | Freq: Every day | ORAL | Status: DC

## 2015-11-25 MED ORDER — BUPIVACAINE-EPINEPHRINE 0.25% -1:200000 IJ SOLN
INTRAMUSCULAR | Status: DC | PRN
  Administered 2015-11-25: 60 mL

## 2015-11-25 MED ORDER — RENAL MULTIVITAMIN FORMULA PO TABS: ORAL_TABLET | Freq: Every day | ORAL | Status: DC

## 2015-11-25 MED ORDER — DIPHENHYDRAMINE HCL 50 MG/ML IJ SOLN
12.5000 mg | Freq: Four times a day (QID) | INTRAMUSCULAR | Status: DC | PRN
  Administered 2015-11-25: 12.5 mg via INTRAVENOUS
  Administered 2015-11-26: 25 mg via INTRAVENOUS
  Filled 2015-11-25: qty 1

## 2015-11-25 MED ORDER — NEOSTIGMINE METHYLSULFATE 10 MG/10ML IV SOLN
INTRAVENOUS | Status: DC | PRN
  Administered 2015-11-25: 3 mg via INTRAVENOUS

## 2015-11-25 MED ORDER — MENTHOL 3 MG MT LOZG: 1.0000 | LOZENGE | OROMUCOSAL | Status: DC | PRN

## 2015-11-25 MED ORDER — ONDANSETRON HCL 4 MG/2ML IJ SOLN
INTRAMUSCULAR | Status: AC
  Filled 2015-11-25: qty 2

## 2015-11-25 MED ORDER — SODIUM CHLORIDE 0.9 % IV SOLN
8.0000 mg | Freq: Four times a day (QID) | INTRAVENOUS | Status: DC | PRN
  Filled 2015-11-25: qty 4

## 2015-11-25 MED ORDER — ALTEPLASE 2 MG IJ SOLR
2.0000 mg | Freq: Once | INTRAMUSCULAR | Status: DC | PRN
  Filled 2015-11-25: qty 2

## 2015-11-25 MED ORDER — VECURONIUM BROMIDE 10 MG IV SOLR
INTRAVENOUS | Status: DC | PRN
  Administered 2015-11-25: 3 mg via INTRAVENOUS
  Administered 2015-11-25 (×3): 1 mg via INTRAVENOUS

## 2015-11-25 MED ORDER — SUCCINYLCHOLINE CHLORIDE 20 MG/ML IJ SOLN
INTRAMUSCULAR | Status: DC | PRN
  Administered 2015-11-25: 50 mg via INTRAVENOUS

## 2015-11-25 MED ORDER — ONDANSETRON HCL 4 MG/2ML IJ SOLN
INTRAMUSCULAR | Status: DC | PRN
  Administered 2015-11-25: 4 mg via INTRAVENOUS

## 2015-11-25 MED ORDER — FINASTERIDE 5 MG PO TABS
5.0000 mg | ORAL_TABLET | Freq: Every day | ORAL | Status: DC
  Administered 2015-11-25 – 2015-12-01 (×4): 5 mg via ORAL
  Filled 2015-11-25 (×6): qty 1

## 2015-11-25 MED ORDER — OXYCODONE HCL 5 MG PO TABS: 5.0000 mg | ORAL_TABLET | ORAL | Status: DC | PRN

## 2015-11-25 MED ORDER — HEPARIN SODIUM (PORCINE) 1000 UNIT/ML DIALYSIS
1000.0000 [IU] | INTRAMUSCULAR | Status: DC | PRN
  Filled 2015-11-25: qty 1

## 2015-11-25 MED ORDER — METOPROLOL TARTRATE 12.5 MG HALF TABLET: 12.5000 mg | ORAL_TABLET | Freq: Two times a day (BID) | ORAL | Status: DC | PRN

## 2015-11-25 MED ORDER — ONDANSETRON HCL 4 MG/2ML IJ SOLN: 4.0000 mg | Freq: Four times a day (QID) | INTRAMUSCULAR | Status: DC | PRN

## 2015-11-25 MED ORDER — FUROSEMIDE 40 MG PO TABS
40.0000 mg | ORAL_TABLET | Freq: Two times a day (BID) | ORAL | Status: DC
  Administered 2015-11-25 – 2015-11-27 (×2): 40 mg via ORAL
  Filled 2015-11-25 (×2): qty 1

## 2015-11-25 MED ORDER — TRAMADOL HCL 50 MG PO TABS
ORAL_TABLET | ORAL | Status: AC
  Filled 2015-11-25: qty 1

## 2015-11-25 MED ORDER — BLISTEX MEDICATED EX OINT
TOPICAL_OINTMENT | CUTANEOUS | Status: DC | PRN
  Filled 2015-11-25: qty 10

## 2015-11-25 SURGICAL SUPPLY — 48 items
APPLIER CLIP 5 13 M/L LIGAMAX5 (MISCELLANEOUS)
CANISTER SUCTION 2500CC (MISCELLANEOUS) ×4 IMPLANT
CHLORAPREP W/TINT 26ML (MISCELLANEOUS) ×4 IMPLANT
CLIP APPLIE 5 13 M/L LIGAMAX5 (MISCELLANEOUS) IMPLANT
COVER SURGICAL LIGHT HANDLE (MISCELLANEOUS) ×4 IMPLANT
DRAPE LAPAROSCOPIC ABDOMINAL (DRAPES) ×4 IMPLANT
DRAPE WARM FLUID 44X44 (DRAPE) ×4 IMPLANT
DRSG TEGADERM 2-3/8X2-3/4 SM (GAUZE/BANDAGES/DRESSINGS) ×8 IMPLANT
DRSG TEGADERM 4X4.75 (GAUZE/BANDAGES/DRESSINGS) ×4 IMPLANT
ELECT REM PT RETURN 9FT ADLT (ELECTROSURGICAL) ×4
ELECTRODE REM PT RTRN 9FT ADLT (ELECTROSURGICAL) ×2 IMPLANT
GAUZE SPONGE 2X2 8PLY STRL LF (GAUZE/BANDAGES/DRESSINGS) ×2 IMPLANT
GLOVE BIO SURGEON STRL SZ7 (GLOVE) ×4 IMPLANT
GLOVE BIO SURGEON STRL SZ8 (GLOVE) ×4 IMPLANT
GLOVE BIOGEL PI IND STRL 6.5 (GLOVE) ×6 IMPLANT
GLOVE BIOGEL PI IND STRL 7.0 (GLOVE) ×2 IMPLANT
GLOVE BIOGEL PI IND STRL 8 (GLOVE) ×2 IMPLANT
GLOVE BIOGEL PI IND STRL 8.5 (GLOVE) ×2 IMPLANT
GLOVE BIOGEL PI INDICATOR 6.5 (GLOVE) ×6
GLOVE BIOGEL PI INDICATOR 7.0 (GLOVE) ×2
GLOVE BIOGEL PI INDICATOR 8 (GLOVE) ×2
GLOVE BIOGEL PI INDICATOR 8.5 (GLOVE) ×2
GLOVE ECLIPSE 8.0 STRL XLNG CF (GLOVE) ×12 IMPLANT
GLOVE SURG SS PI 6.5 STRL IVOR (GLOVE) ×4 IMPLANT
GOWN STRL REUS W/ TWL LRG LVL3 (GOWN DISPOSABLE) ×6 IMPLANT
GOWN STRL REUS W/ TWL XL LVL3 (GOWN DISPOSABLE) ×6 IMPLANT
GOWN STRL REUS W/TWL LRG LVL3 (GOWN DISPOSABLE) ×6
GOWN STRL REUS W/TWL XL LVL3 (GOWN DISPOSABLE) ×6
KIT BASIN OR (CUSTOM PROCEDURE TRAY) ×4 IMPLANT
KIT ROOM TURNOVER OR (KITS) ×4 IMPLANT
MESH ULTRAPRO 6X6 15CM15CM (Mesh General) ×8 IMPLANT
NEEDLE 22X1 1/2 (OR ONLY) (NEEDLE) ×4 IMPLANT
NS IRRIG 1000ML POUR BTL (IV SOLUTION) ×4 IMPLANT
PAD ARMBOARD 7.5X6 YLW CONV (MISCELLANEOUS) ×8 IMPLANT
SCISSORS LAP 5X35 DISP (ENDOMECHANICALS) ×4 IMPLANT
SET IRRIG TUBING LAPAROSCOPIC (IRRIGATION / IRRIGATOR) ×4 IMPLANT
SPONGE GAUZE 2X2 STER 10/PKG (GAUZE/BANDAGES/DRESSINGS) ×2
SUT MNCRL AB 4-0 PS2 18 (SUTURE) ×4 IMPLANT
SUT VIC AB 3-0 SH 27 (SUTURE) ×2
SUT VIC AB 3-0 SH 27XBRD (SUTURE) ×2 IMPLANT
SUT VICRYL 0 UR6 27IN ABS (SUTURE) ×4 IMPLANT
TOWEL OR 17X24 6PK STRL BLUE (TOWEL DISPOSABLE) IMPLANT
TOWEL OR 17X26 10 PK STRL BLUE (TOWEL DISPOSABLE) ×4 IMPLANT
TRAY FOLEY CATH 16FRSI W/METER (SET/KITS/TRAYS/PACK) IMPLANT
TRAY LAPAROSCOPIC MC (CUSTOM PROCEDURE TRAY) ×4 IMPLANT
TROCAR XCEL BLUNT TIP 100MML (ENDOMECHANICALS) ×4 IMPLANT
TROCAR XCEL NON-BLD 5MMX100MML (ENDOMECHANICALS) ×8 IMPLANT
TUBING INSUFFLATION (TUBING) ×4 IMPLANT

## 2015-11-25 NOTE — H&P (Signed)
[facility]  Location: [facility] Patient #: [id number] DOB: Dec 23, 1940 Married / Language: English / Race: Black or African American Male   History of Present Illness  The patient is a 75 year old male who presents with an inguinal hernia. Note for "Inguinal hernia": Patient sent for surgical consultation by his nephrologist, Dr. 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 Has had a RIGHT inguinal hernia for several years. Was reduced 2 years ago. He then had another episode of incarceration. Required reduction in the emergency room around Thanksgiving. Dr. Lindie Spruce did that urgently. Based on concerns, surgical consultation requested to consider elective repair. I believe the patient was sent to me due to my greater availability than our Trauma director. Patient just noticed a lump came out while ago. Occasionally hurts. Denies much pain or discomfort on the other side. Seems rather cranky and aggravated today but ultimately consolable  He gets dialyzed Monday Wednesday Friday history of LEFT upper arm fistula. Does have some urinary urgency. There is documentation of a urinary tract infection in the past. He does not recall this. No to have enlarged prostate on CT scan 2 years ago. He says he often stand try and urinate but nothing comes out. Does not urinate very often, oliguric at this point. Does not recall history of urinary tract infection but there is a documentation of that. No issues recently. He does smoke but less than a pack a day. He claims he can walk a couple miles on flat level without much difficulty. Does get winded but never to the point of having to stop. His never had a heart attack or stroke. He's never had basic screening for prostate or colon issues. Did not seem interested in any event at this time.  No new events.  Ready for surgery   Other Problems Fay Records, CMA; 09/28/2015  11:21 AM) Inguinal Hernia  Past Surgical History Fay Records, CMA; 09/28/2015 11:21 AM) Dialysis Shunt / Fistula  Allergies Fay Records, CMA; 09/28/2015 11:22 AM) No Known Drug Allergies12/20/2016  Medication History Fay Records, CMA; 09/28/2015 11:24 AM) AmLODIPine Besylate (  Tablet, Oral) Active. Calcium Acetate (Phos Binder) (  Capsule, Oral) Active. Finasteride (  Tablet, Oral) Active. Furosemide (  Tablet, Oral) Active. Metoprolol Succinate ER (  Tablet ER 24HR, Oral) Active. Tamsulosin HCl (0.4MG  Capsule, Oral) Active. Multiple Vitamin (Oral) Active. Benadryl (  Capsule, Oral) Active. Sarna Sensitive (1% Lotion, External) Active. Calcium Carbonate (  Tablet, Oral) Active. Combivent (18-103MCG/ACT Aerosol, Inhalation) Active. Medications Reconciled  Social History Fay Records, [place]; 09/28/2015 11:21 AM) Alcohol use Remotely quit alcohol use. Caffeine use Carbonated beverages, Coffee, Tea. Illicit drug use Remotely quit drug use. Tobacco use Current some day smoker.  Family History Fay Records, [place]; 09/28/2015 11:21 AM) Alcohol Abuse Father.    Review of Systems Fay Records CMA; 09/28/2015 11:21 AM) General Not Present- Appetite Loss, Chills, Fatigue, Fever, Night Sweats, Weight Gain and Weight Loss. Skin Not Present- Change in Wart/Mole, Dryness, Hives, Jaundice, New Lesions, Non-Healing Wounds, Rash and Ulcer. HEENT Not Present- Earache, Hearing Loss, Hoarseness, Nose Bleed, Oral Ulcers, Ringing in the Ears, Seasonal Allergies, Sinus Pain, Sore Throat, Visual Disturbances, Wears glasses/contact lenses and Yellow Eyes. Respiratory Not Present- Bloody sputum, Chronic Cough, Difficulty Breathing, Snoring and Wheezing. Breast Not Present- Breast Mass, Breast Pain, Nipple Discharge and Skin Changes. Cardiovascular Not Present- Chest Pain, Difficulty Breathing Lying Down, Leg Cramps, Palpitations, Rapid Heart Rate, Shortness of  Breath and Swelling of Extremities. Gastrointestinal Not  Present- Abdominal Pain, Bloating, Bloody Stool, Change in Bowel Habits, Chronic diarrhea, Constipation, Difficulty Swallowing, Excessive gas, Gets full quickly at meals, Hemorrhoids, Indigestion, Nausea, Rectal Pain and Vomiting. Male Genitourinary Not Present- Blood in Urine, Change in Urinary Stream, Frequency, Impotence, Nocturia, Painful Urination, Urgency and Urine Leakage. Musculoskeletal Not Present- Back Pain, Joint Pain, Joint Stiffness, Muscle Pain, Muscle Weakness and Swelling of Extremities. Neurological Not Present- Decreased Memory, Fainting, Headaches, Numbness, Seizures, Tingling, Tremor, Trouble walking and Weakness. Psychiatric Not Present- Anxiety, Bipolar, Change in Sleep Pattern, Depression, Fearful and Frequent crying. Endocrine Not Present- Cold Intolerance, Excessive Hunger, Hair Changes, Heat Intolerance, Hot flashes and New Diabetes. Hematology Not Present- Easy Bruising, Excessive bleeding, Gland problems, HIV and Persistent Infections.  Vitals Fay Records CMA; 09/28/2015 11:25 AM) 09/28/2015 11:24 AM Weight: 165 lb Height: 70in Body Surface Area: 1.92 m Body Mass Index: 23.67 kg/m  Temp.: 34F(Temporal)  Pulse: 90 (Regular)  BP: 130/70 (Sitting, Left Arm, Standard)       Physical Exam Ardeth Sportsman MD; 09/28/2015 11:44 AM) General Mental Status-Alert. General Appearance-Not in acute distress, Not Sickly. Orientation-Oriented X3. Hydration-Well hydrated. Voice-Normal.  Integumentary Global Assessment Upon inspection and palpation of skin surfaces of the - Axillae: non-tender, no inflammation or ulceration, no drainage. and Distribution of scalp and body hair is normal. General Characteristics Temperature - normal warmth is noted.  Head and Neck Head-normocephalic, atraumatic with no lesions or palpable masses. Face Global Assessment - atraumatic, no absence of  expression. Neck Global Assessment - no abnormal movements, no bruit auscultated on the right, no bruit auscultated on the left, no decreased range of motion, non-tender. Trachea-midline. Thyroid Gland Characteristics - non-tender.  Eye Eyeball - Left-Extraocular movements intact, No Nystagmus. Eyeball - Right-Extraocular movements intact, No Nystagmus. Cornea - Left-No Hazy. Cornea - Right-No Hazy. Sclera/Conjunctiva - Left-No scleral icterus, No Discharge. Sclera/Conjunctiva - Right-No scleral icterus, No Discharge. Pupil - Left-Direct reaction to light normal. Pupil - Right-Direct reaction to light normal.  ENMT Ears Pinna - Left - no drainage observed, no generalized tenderness observed. Right - no drainage observed, no generalized tenderness observed. Nose and Sinuses External Inspection of the Nose - no destructive lesion observed. Inspection of the nares - Left - quiet respiration. Right - quiet respiration. Mouth and Throat Lips - Upper Lip - no fissures observed, no pallor noted. Lower Lip - no fissures observed, no pallor noted. Nasopharynx - no discharge present. Oral Cavity/Oropharynx - Tongue - no dryness observed. Oral Mucosa - no cyanosis observed. Hypopharynx - no evidence of airway distress observed.  Chest and Lung Exam Inspection Movements - Normal and Symmetrical. Accessory muscles - No use of accessory muscles in breathing. Palpation Palpation of the chest reveals - Non-tender. Auscultation Breath sounds - Normal and Clear.  Cardiovascular Auscultation Rhythm - Regular. Murmurs & Other Heart Sounds - Auscultation of the heart reveals - No Murmurs and No Systolic Clicks.  Abdomen Inspection Inspection of the abdomen reveals - No Visible peristalsis and No Abnormal pulsations. Umbilicus - No Bleeding, No Urine drainage. Palpation/Percussion Palpation and Percussion of the abdomen reveal - Soft, Non Tender, No Rebound tenderness, No  Rigidity (guarding) and No Cutaneous hyperesthesia. Note: Flat. No diastases. No umbilical hernia. No incisions.   Male Genitourinary Sexual Maturity Tanner 5 - Adult hair pattern and Adult penile size and shape. Note: Small but obvious RIGHT inguinal hernia. Possible small LEFT lateral inguinal hernia on Valsalva  Normal external genitalia. Epididymi, testes, and spermatic cords normal without any masses. Thickening of  the scrotal skin but not severe.   Rectal Note: Deferred per patient request   Peripheral Vascular Upper Extremity Inspection - Left - No Cyanotic nailbeds, Not Ischemic. Right - No Cyanotic nailbeds, Not Ischemic.  Neurologic Neurologic evaluation reveals -normal attention span and ability to concentrate, able to name objects and repeat phrases. Appropriate fund of knowledge , normal sensation and normal coordination. Mental Status Affect - not angry, not paranoid. Cranial Nerves-Normal Bilaterally. Gait-Normal.  Neuropsychiatric Mental status exam performed with findings of-able to articulate well with normal speech/language, rate, volume and coherence, thought content normal with ability to perform basic computations and apply abstract reasoning and no evidence of hallucinations, delusions, obsessions or homicidal/suicidal ideation.  Musculoskeletal Global Assessment Spine, Ribs and Pelvis - no instability, subluxation or laxity. Right Upper Extremity - no instability, subluxation or laxity.  Lymphatic Head & Neck  General Head & Neck Lymphatics: Bilateral - Description - No Localized lymphadenopathy. Axillary  General Axillary Region: Bilateral - Description - No Localized lymphadenopathy. Femoral & Inguinal  Generalized Femoral & Inguinal Lymphatics: Left - Description - No Localized lymphadenopathy. Right - Description - No Localized lymphadenopathy.    Assessment & Plan Ardeth Sportsman MD; 09/28/2015 11:56 AM) RIGHT INGUINAL HERNIA  (K40.90) Impression: Small but definite RIGHT inguinal hernia with recent episode of incarceration. Impulse on lateral LEFT groin suspicious for small indirect hernia on the LEFT side as well.  He was a little cranky and we got off on the wrong foot but he seemed to lighten up by the end of the visit.  I think he would benefit from exploration and repair of hernias found. Would plan to do on an off dialysis day. He gets dialysis on Monday Wednesday Friday. Therefore, plan surgery at [facility] on a Tuesday or Thursday. Because his kidney failure is stable and his exercise tolerance is pretty good, can try and set this up as an outpatient surgery. I would have a low threshold to keep overnight if there are concerns. Current Plans You are being scheduled for surgery - Our schedulers will call you.  You should hear from our office's scheduling department within 5 working days about the location, date, and time of surgery. We try to make accommodations for patient's preferences in scheduling surgery, but sometimes the OR schedule or the surgeon's schedule prevents [place] from making those accommodations.  If you have not heard from our office [phone]) in 5 working days, call the office and ask for your surgeon's nurse.  If you have other questions about your diagnosis, plan, or surgery, call the office and ask for your surgeon's nurse.  Written instructions provided Pt Education - Pamphlet Given - Laparoscopic Hernia Repair: discussed with patient and provided information. The anatomy & physiology of the abdominal wall and pelvic floor was discussed. The pathophysiology of hernias in the inguinal and pelvic region was discussed. Natural history risks such as progressive enlargement, pain, incarceration, and strangulation was discussed. Contributors to complications such as smoking, obesity, diabetes, prior surgery, etc were discussed.  I feel the risks of no intervention will lead to serious problems  that outweigh the operative risks; therefore, I recommended surgery to reduce and repair the hernia. I explained laparoscopic techniques with possible need for an open approach. I noted usual use of mesh to patch and/or buttress hernia repair  Risks such as bleeding, infection, abscess, need for further treatment, heart attack, death, and other risks were discussed. I noted a good likelihood this will help address the problem.  Goals of post-operative recovery were discussed as well. Possibility that this will not correct all symptoms was explained. I stressed the importance of low-impact activity, aggressive pain control, avoiding constipation, & not pushing through pain to minimize risk of post-operative chronic pain or injury. Possibility of reherniation was discussed. We will work to minimize complications.  An educational handout further explaining the pathology & treatment options was given as well. Questions were answered. The patient expresses understanding & wishes to proceed with surgery.  Pt Education - [facility] Hernia Post-Op HCI (Jojo Geving): discussed with patient and provided information. Pt Education - [facility] - General recommendations Pt Education - [facility] Pain control - tylenol only: discussed with patient and provided information. ENLARGED PROSTATE (N40.0) Impression: I think he could benefit from evaluation. Seen rather large 2 years ago. Suspect he has some urinary urgency even though he sounds oliguric. I will defer to his nephrologist see if urology consultation of benefit. TOBACCO ABUSE COUNSELING (Z71.6) Current Plans Pt Education - [facility] STOP SMOKING!   Ardeth Sportsman, M.D., F.A.C.S. Gastrointestinal and Minimally Invasive Surgery [facility] [address] [place], [place] [address] [phone] Main / Paging

## 2015-11-25 NOTE — Progress Notes (Signed)
Patient in phase 2 ready for discharge to home. Family called back and are not comfortable with taking patient home at this time. Patient is the primary caregiver for his wife. Dr Michaell Cowing called and notified- will admit for overnight observation.

## 2015-11-25 NOTE — Op Note (Signed)
11/25/2015  9:36 AM  PATIENT:  Shane [name]  75 y.o. male  Patient Care Team: [name], MD as PCP - General Terrial Rhodes, MD as Consulting Physician (Nephrology) [name], MD as Consulting Physician (Vascular Surgery) Karie Soda, MD as Consulting Physician (General Surgery)  PRE-OPERATIVE DIAGNOSIS:  Right inguinal hernia probable LIH  POST-OPERATIVE DIAGNOSIS:    Bilateral inguinal hernias Right hydrocele  PROCEDURE:   LAPAROSCOPIC BILATERAL INGUINAL HERNIA REPAIR EXCISION OF RIGHT HYDROCELE INSERTION OF MESH  SURGEON:  Surgeon(s): Karie Soda, MD  ASSISTANT: RN   ANESTHESIA:   Regional ilioinguinal and genitofemoral and spermatic cord nerve blocks with GETA  EBL:  Total I/O In: 500 [I.V.:500] Out: -   Delay start of Pharmacological VTE agent (>24hrs) due to surgical blood loss or risk of bleeding:  no  DRAINS: NONE  SPECIMEN:  RIGHT HYDROCELE WALL  DISPOSITION OF SPECIMEN:  PATHOLOGY  COUNTS:  YES  PLAN OF CARE: Discharge to home after PACU  PATIENT DISPOSITION:  PACU - hemodynamically stable.  INDICATION: Patient with definite right & probable left inguinal hernias.  I offered surgery  The anatomy & physiology of the abdominal wall and pelvic floor was discussed.  The pathophysiology of hernias in the inguinal and pelvic region was discussed.  Natural history risks such as progressive enlargement, pain, incarceration & strangulation was discussed.   Contributors to complications such as smoking, obesity, diabetes, prior surgery, etc were discussed.    I feel the risks of no intervention will lead to serious problems that outweigh the operative risks; therefore, I recommended surgery to reduce and repair the hernia.  I explained laparoscopic techniques with possible need for an open approach.  I noted usual use of mesh to patch and/or buttress hernia repair  Risks such as bleeding, infection, abscess, need for further treatment, heart  attack, death, and other risks were discussed.  I noted a good likelihood this will help address the problem.   Goals of post-operative recovery were discussed as well.  Possibility that this will not correct all symptoms was explained.  I stressed the importance of low-impact activity, aggressive pain control, avoiding constipation, & not pushing through pain to minimize risk of post-operative chronic pain or injury. Possibility of reherniation was discussed.  We will work to minimize complications.     An educational handout further explaining the pathology & treatment options was given as well.  Questions were answered.  The patient expresses understanding & wishes to proceed with surgery.  OR FINDINGS: Indirect large RIH with moderate hydrocele  Left inguinal hernia direct>indirect small.  No definite obturatoe/femroal hernias  Large bladder  DESCRIPTION:   The patient was identified & brought into the operating room. The patient was positioned supine with arms tucked. SCDs were active during the entire case. The patient underwent general anesthesia without any difficulty.  The abdomen was prepped and draped in a sterile fashion. The patient's bladder was emptied.  A Surgical Timeout confirmed our plan.  I made a transverse incision through the inferior umbilical fold.  I made a small transverse nick through the anterior rectus fascia contralateral to the inguinal hernia side and placed a 0-vicryl stitch through the fascia.  I placed a Hasson trocar into the preperitoneal plane.  Entry was clean.  We induced carbon dioxide insufflation. Camera inspection revealed no injury.  I used a 10mm angled scope to bluntly free the peritoneum off the infraumbilical anterior abdominal wall.  I created enough of a preperitoneal pocket  to place 5mm ports into the right & left mid-abdomen into this preperitoneal cavity.  I focused attention on the right side since that was the dominant hernia side.   I used  blunt & focused sharp dissection to free the peritoneum off the flank and down to the pubic rim.  I freed the anteriolateral bladder wall off the anteriolateral pelvic wall, sparing midline attachments.   I located a swath of peritoneum going into a hernia fascial defect at the internal ring consistent with an indirect inguinal hernia.  I gradually freed the peritoneal hernia sac off safely and reduced it into the preperitoneal space.  It was quite thickened along with a hydrocele associated with it.  I skeletonized  The hydrocele off and opened it to evacuate clear serous fluid consistent with hydrocele repair unroofing.  I trimmed that off along withthe hernia sac.  I sent that for pathology.  I closed the hernia sac defect with intracorporeal suturing using absorbable 3-0 Vicryl suture.   I freed the peritoneum off the spermatic vessels & vas deferens.  I freed peritoneum off the retroperitoneum along the psoas muscle.    I checked & assured hemostasis.    I turned attention on the opposite side.  I did dissection in a similar, mirror-image fashion. The patient had a small pantaloon type direct/indirect inguinal hernias.      I chose 15x15 cm sheets of ultra-lightweight polypropylene mesh (Ultrapro), one for each side.  I cut a single sigmoid-shaped slit ~6cm from a corner of each mesh.  I placed the meshes into the preperitoneal space & laid them as overlapping diamonds such that at the inferior points, a 6x6 cm corner flap rested in the true anterolateral pelvis, covering the obturator & femoral foramina.   I allowed the bladder to return to the pubis, this helping tuck the corners of the mesh in the anteriolateral pelvis.  The medial corners overlapped each other across midline cephalad to the pubic rim.   This provided >2 inch coverage around the hernia.  Because the defects well covered and not particularly large, I did not place any tacks.  I held the hernia sacs cephalad & evacuated carbon dioxide.   I closed the fascia with absorbable suture.  I closed the skin using 4-0 monocryl stitch.  Sterile dressings were applied. The patient was extubated & arrived in the PACU in stable condition..  I had discussed postoperative care with the patient in the holding area.   I did discuss operative findings and postoperative goals / instructions to the patient's family (son/daughter in Social worker) as well.  Instructions are written in the chart.  Ardeth Sportsman, M.D., F.A.C.S. Gastrointestinal and Minimally Invasive Surgery [facility] [address] [place], [place] [address] [phone] Main / Paging

## 2015-11-25 NOTE — Transfer of Care (Signed)
Immediate Anesthesia Transfer of Care Note  Patient: Shane [name]  Procedure(s) Performed: Procedure(s): LAPAROSCOPIC BILATERAL INGUINAL HERNIA REPAIR--EXCISION OF RIGHT HYDROCELE (Bilateral) INSERTION OF MESH (Bilateral)  Patient Location: PACU  Anesthesia Type:General  Level of Consciousness: awake  Airway & Oxygen Therapy: Patient Spontanous Breathing and Patient connected to nasal cannula oxygen  Post-op Assessment: Report given to RN and Post -op Vital signs reviewed and stable  Post vital signs: stable  Last Vitals:  Filed Vitals:   11/25/15 0547  BP: 133/90  Pulse: 92  Temp: 36.9 C  Resp: 18    Complications: No apparent anesthesia complications

## 2015-11-25 NOTE — Progress Notes (Signed)
Report given to maryann rn as caregiver 

## 2015-11-25 NOTE — Progress Notes (Signed)
D/w Dr Hyman Hopes.  [facility] will help follow patient given his ESRD & HD qMWF  Ardeth Sportsman, M.D., F.A.C.S. Gastrointestinal and Minimally Invasive Surgery [facility] [address] [place], [place] [address] [phone] Main / Paging

## 2015-11-25 NOTE — Interval H&P Note (Signed)
History and Physical Interval Note:  11/25/2015 7:51 AM  Shane [name]  has presented today for surgery, with the diagnosis of Right inguinal hernia probable LIH  The various methods of treatment have been discussed with the patient and family. After consideration of risks, benefits and other options for treatment, the patient has consented to  Procedure(s): LAPAROSCOPIC BILATERAL INGUINAL HERNIA REPAIR (Bilateral) as a surgical intervention .  The patient's history has been reviewed, patient examined, no change in status, stable for surgery.  I have reviewed the patient's chart and labs.  Questions were answered to the patient's satisfaction.     Awesome Jared C.

## 2015-11-25 NOTE — Anesthesia Postprocedure Evaluation (Signed)
Anesthesia Post Note  Patient: Shane [name]  Procedure(s) Performed: Procedure(s) (LRB): LAPAROSCOPIC BILATERAL INGUINAL HERNIA REPAIR--EXCISION OF RIGHT HYDROCELE (Bilateral) INSERTION OF MESH (Bilateral)  Patient location during evaluation: PACU Anesthesia Type: General Level of consciousness: sedated Pain management: pain level controlled Vital Signs Assessment: post-procedure vital signs reviewed and stable Respiratory status: spontaneous breathing and respiratory function stable Cardiovascular status: stable Anesthetic complications: no    Last Vitals:  Filed Vitals:   11/25/15 1007 11/25/15 1015  BP: 128/73   Pulse:  81  Temp:    Resp:  21    Last Pain:  Filed Vitals:   11/25/15 1019  PainSc: 0-No pain                 Chastelyn Athens DANIEL

## 2015-11-25 NOTE — Consult Note (Signed)
[facility] Renal Consultation Note    Indication for Consultation:  Management of ESRD/hemodialysis; anemia, hypertension/volume and secondary hyperparathyroidism PCP: [name], MD   HPI: Shane [name] is a 75 y.o. 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 Michaell Cowing following bilateral inguinal hernia repairs and excision of right hydrocele. He has been attending dialysis regularly and getting below EDW at times.  He has BP drops on HD even with modest gains, so norvasc was discontinued this week by the rounding nephrologist. Post operatively he had a little pain , but none now. He denies, SOB, N, V, edema. He still makes urine at times . He is a smoker. He hasn't had anything to eat or drink since surgery. His only problem with his HD treatments is sitting still for a long time.He will be due for dialysis Friday.   Past Medical History  Diagnosis Date  . Hypertension   . Incarcerated inguinal hernia, unilateral     right side  . Tobacco abuse   . Varicocele   . Arthritis     bursitis in hip  . 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 Chronic kidney disease     secondary to amyloidosis AL lambda phenotype, HD MWF Dr. Reynolds Bowl  . Full dentures   . Urinary tract infection due to Enterococcus 08/03/2015   Past Surgical History  Procedure Laterality Date  . Av fistula placement, radiocephalic  01/30/2011    Right w/ ligation of competing venous branch by Dr. Leonides Sake  . Av fistula placement, brachiocephalic  10/19/2010    LEFT  . Av fistula placement, radiocephalic  09/13/2010    LEFT  . Colonoscopy    . Bascilic vein transposition Left 04/08/2015    Procedure: BASILIC VEIN TRANSPOSITION;  Surgeon: Pryor Ochoa, MD;  Location: [facility] OR;  Service: Vascular;  Laterality: Left;  Marland Kitchen  Multiple tooth extractions     Family History  Problem Relation Age of Onset  . Asthma Mother   . Hypertension Mother   . Alcohol abuse Father   . Hypertension Father   . Hypertension Sister   . Diabetes Sister   . Heart disease Sister   . Hypertension Brother   . Peripheral vascular disease Brother   . Hypertension Daughter   . Heart disease Daughter    Social History:  reports that he has been smoking Cigarettes.  He has a 25 pack-year smoking history. He has never used smokeless tobacco. He reports that he does not drink alcohol or use illicit drugs. No Known Allergies Prior to Admission medications   Medication Sig Start Date End Date Taking? Authorizing Provider  amLODipine (NORVASC) 10 MG tablet Take 1 tablet (10 mg total) by mouth daily. 02/12/13  Yes Genelle Gather, MD  B Complex-C-Folic Acid (RENAL MULTIVITAMIN FORMULA PO) Take 500 mg by mouth daily.   Yes Historical Provider, MD  calcium acetate (PHOSLO) 667 MG capsule Take 2,001 mg by mouth 3 (three) times daily with meals.    Yes Historical Provider, MD  doxycycline (VIBRAMYCIN) 100 MG capsule Take 100 mg by mouth 2 (two) times daily.   Yes Historical Provider, MD  finasteride (PROSCAR) 5 MG tablet Take 5 mg by mouth daily.   Yes Historical Provider, MD  furosemide (LASIX) 40 MG  tablet Take 1 tablet (40 mg total) by mouth daily. Patient taking differently: Take 40 mg by mouth 2 (two) times daily.  04/15/15  Yes Belkys A Regalado, MD  metoprolol succinate (TOPROL-XL) 50 MG 24 hr tablet Take 50 mg by mouth daily. Take with or immediately following a meal.   Yes Historical Provider, MD  Tamsulosin HCl (FLOMAX) 0.4 MG CAPS Take 0.4 mg by mouth 2 (two) times daily.    Yes Historical Provider, MD  albuterol-ipratropium (COMBIVENT) 18-103 MCG/ACT inhaler Inhale 2 puffs into the lungs every 6 (six) hours as needed for wheezing. 02/11/13   Genelle Gather, MD  traMADol (ULTRAM) 50 MG tablet Take 1-2 tablets (50-100 mg total) by mouth  every 6 (six) hours as needed for moderate pain or severe pain. 11/25/15   Karie Soda, MD   Current Facility-Administered Medications  Medication Dose Route Frequency Provider Last Rate Last Dose  . scopolamine (TRANSDERM-SCOP) 1 MG/3DAYS 1.5 mg  1 patch Transdermal Once Heather Roberts, MD   1.5 mg at 11/25/15 1610  . traMADol (ULTRAM) 50 MG tablet            Labs: Basic Metabolic Panel:  Recent Labs Lab 11/25/15 0628  NA 141  K 4.2  GLUCOSE 85   CBC:  Recent Labs Lab 11/25/15 0628  HGB 11.6*  HCT 34.0*   ROS: As per HPI otherwise negative.  R Physical Exam: Filed Vitals:   11/25/15 1053 11/25/15 1059 11/25/15 1200 11/25/15 1240  BP:  109/70 106/70 109/71  Pulse: 67 68 61 63  Temp: 97 F (36.1 C) 97 F (36.1 C)  98 F (36.7 C)  TempSrc:      Resp: 16 14 13 18   Weight:      SpO2: 100% 100% 100% 100%     General: Slender elderly AAM in no acute distress. Head: Normocephalic, atraumatic, sclera non-icteric, mucus membranes are moist Neck: Supple. JVD not elevated. Lungs:   Some coarse BS, occ insp short wheeze  Breathing is unlabored.- incentive spirometer at bedside Heart: RRR with S1 S2. No murmurs, rubs, or gallops appreciated. Abdomen: Soft, non-distended with normoactive bowel sounds. Lap incision sites intact  Lower extremities:without edema or ischemic changes, no open wounds /SCDs in place Neuro: Alert and oriented X 3. Moves all extremities spontaneously. Psych:  Responds to questions appropriately with a normal affect. Dialysis Access:left upper AVF + bruit  Dialysis Orders:  [facility] 4 hr EDW 64 3k 2.25 Ca 400/800 heparin 2000 hectorol 2 venofer 50/Friday Mircera 50 q 6 weeks - last 2/10 last hgb 11 P 6-7 iPTH 237 Ca ok AccessL left upper AVF  Assessment/Plan: 1.  s/p bilateral hernia repair and excision of hydrocele- per Dr. Michaell Cowing; has ancef 2 gm IV q 8 hours ordered  (already had 2 gm pre op)- renal dose is 1 gm q 24 - so I will d/c other doses for today;  not clear why he is on doxycycline 2.  ESRD -  MWF - plan HD in am - hold heparin; k's generally run 4.2-4.4 on 3 K bath - post op labs being checked 3.  Hypertension/volume  - controlled with MTP and volume reduction on HD- already had BB dose this am so will cancel pm dose and resume tomorrow evening- BE SURE this is discontinued for tomorrow at d/c - He got his pm dose today already and is likely on his d/c instructions - even though Dr. Salena Saner discontinued it this week, when I asked him about  it, he couldn't remember 4.  Anemia  - stable hgb - not due for ESA redose- dose weekly Fe; post op labs being drawn 5.  M etabolic bone disease -  Continue hectorol/binders 6.  Nutrition - CL - added multivits - advance to renal diet as able 7. Tobacco abuse - encouraged cessation  Sheffield Slider, PA-C [facility] Beeper [phone] 11/25/2015, 4:02 PM

## 2015-11-25 NOTE — Anesthesia Procedure Notes (Signed)
Procedure Name: Intubation Date/Time: 11/25/2015 8:08 AM Performed by: Shane [name] Pre-anesthesia Checklist: Patient identified, Timeout performed, Emergency Drugs available, Suction available and Patient being monitored Patient Re-evaluated:Patient Re-evaluated prior to inductionOxygen Delivery Method: Circle system utilized Preoxygenation: Pre-oxygenation with 100% oxygen Intubation Type: IV induction Ventilation: Mask ventilation without difficulty Laryngoscope Size: Mac and 4 Grade View: Grade I Tube type: Oral Tube size: 7.5 mm Number of attempts: 1 Airway Equipment and Method: Stylet Placement Confirmation: ETT inserted through vocal cords under direct vision,  positive ETCO2 and breath sounds checked- equal and bilateral Secured at: 22 cm Tube secured with: Tape Dental Injury: Teeth and Oropharynx as per pre-operative assessment

## 2015-11-26 LAB — BLOOD GAS, ARTERIAL
Acid-Base Excess: 5.3 mmol/L — ABNORMAL HIGH (ref 0.0–2.0)
Bicarbonate: 28.7 mEq/L — ABNORMAL HIGH (ref 20.0–24.0)
DRAWN BY: 40415
O2 CONTENT: 2 L/min
O2 Saturation: 98 %
PCO2 ART: 38.1 mmHg (ref 35.0–45.0)
PH ART: 7.49 — AB (ref 7.350–7.450)
Patient temperature: 98.6
TCO2: 29.9 mmol/L (ref 0–100)
pO2, Arterial: 100 mmHg (ref 80.0–100.0)

## 2015-11-26 LAB — RENAL FUNCTION PANEL
Albumin: 3.1 g/dL — ABNORMAL LOW (ref 3.5–5.0)
Anion gap: 12 (ref 5–15)
BUN: 32 mg/dL — ABNORMAL HIGH (ref 6–20)
CO2: 22 mmol/L (ref 22–32)
Calcium: 9.8 mg/dL (ref 8.9–10.3)
Chloride: 103 mmol/L (ref 101–111)
Creatinine, Ser: 7.82 mg/dL — ABNORMAL HIGH (ref 0.61–1.24)
GFR calc Af Amer: 7 mL/min — ABNORMAL LOW (ref >60)
GFR calc non Af Amer: 6 mL/min — ABNORMAL LOW (ref >60)
Glucose, Bld: 149 mg/dL — ABNORMAL HIGH (ref 65–99)
Phosphorus: 7.2 mg/dL — ABNORMAL HIGH (ref 2.5–4.6)
Potassium: 4.7 mmol/L (ref 3.5–5.1)
Sodium: 137 mmol/L (ref 135–145)

## 2015-11-26 LAB — CBC WITH DIFFERENTIAL/PLATELET
Basophils Absolute: 0 10*3/uL (ref 0.0–0.1)
Basophils Relative: 0 %
Eosinophils Absolute: 0.8 10*3/uL — ABNORMAL HIGH (ref 0.0–0.7)
Eosinophils Relative: 14 %
HCT: 31.7 % — ABNORMAL LOW (ref 39.0–52.0)
Hemoglobin: 10 g/dL — ABNORMAL LOW (ref 13.0–17.0)
Lymphocytes Relative: 16 %
Lymphs Abs: 1 10*3/uL (ref 0.7–4.0)
MCH: 31.8 pg (ref 26.0–34.0)
MCHC: 31.5 g/dL (ref 30.0–36.0)
MCV: 101 fL — ABNORMAL HIGH (ref 78.0–100.0)
Monocytes Absolute: 0.7 10*3/uL (ref 0.1–1.0)
Monocytes Relative: 11 %
Neutro Abs: 3.7 10*3/uL (ref 1.7–7.7)
Neutrophils Relative %: 59 %
Platelets: 151 10*3/uL (ref 150–400)
RBC: 3.14 MIL/uL — ABNORMAL LOW (ref 4.22–5.81)
RDW: 14 % (ref 11.5–15.5)
WBC: 6.2 10*3/uL (ref 4.0–10.5)

## 2015-11-26 LAB — CBC
HEMATOCRIT: 31.7 % — AB (ref 39.0–52.0)
HEMOGLOBIN: 9.9 g/dL — AB (ref 13.0–17.0)
MCH: 31.3 pg (ref 26.0–34.0)
MCHC: 31.2 g/dL (ref 30.0–36.0)
MCV: 100.3 fL — AB (ref 78.0–100.0)
Platelets: 125 10*3/uL — ABNORMAL LOW (ref 150–400)
RBC: 3.16 MIL/uL — AB (ref 4.22–5.81)
RDW: 13.8 % (ref 11.5–15.5)
WBC: 8 10*3/uL (ref 4.0–10.5)

## 2015-11-26 MED ORDER — DOXERCALCIFEROL 4 MCG/2ML IV SOLN
INTRAVENOUS | Status: AC
  Filled 2015-11-26: qty 2

## 2015-11-26 MED ORDER — LORAZEPAM 1 MG PO TABS
1.0000 mg | ORAL_TABLET | Freq: Once | ORAL | Status: AC
  Administered 2015-11-26: 1 mg via ORAL
  Filled 2015-11-26: qty 1

## 2015-11-26 MED ORDER — DIPHENHYDRAMINE HCL 50 MG/ML IJ SOLN
INTRAMUSCULAR | Status: AC
  Filled 2015-11-26: qty 1

## 2015-11-26 MED ORDER — DIPHENHYDRAMINE HCL 50 MG/ML IJ SOLN
INTRAMUSCULAR | Status: AC
  Administered 2015-11-26: 25 mg via INTRAVENOUS
  Filled 2015-11-26: qty 1

## 2015-11-26 NOTE — Progress Notes (Signed)
[facility] Rounding Note  Subjective:  Seen in the dialysis unit Very jovial this morning Not much pain Dr. Michaell Cowing plans to let him go home today  Objective Vital signs in last 24 hours: Filed Vitals:   11/26/15 0706 11/26/15 0719 11/26/15 0721 11/26/15 0730  BP: 116/77 106/68 106/70 99/63  Pulse: 70 63 65 73  Temp: 98.1 F (36.7 C)     TempSrc: Oral     Resp: 20     Height:      Weight: 62.6 kg (138 lb 0.1 oz)     SpO2: 100%      Weight change: 0 kg (0 lb)  Intake/Output Summary (Last 24 hours) at 11/26/15 0835 Last data filed at 11/25/15 2300  Gross per 24 hour  Intake   1590 ml  Output     21 ml  Net   1569 ml    Physical Exam:  BP 99/63 mmHg  Pulse 73  Temp(Src) 98.1 F (36.7 C) (Oral)  Resp 20  Ht 5' 10.5" (1.791 m)  Wt 62.6 kg (138 lb 0.1 oz)  BMI 19.52 kg/m2  SpO2 100% General: Slender elderly AAM in no acute distress. Very jovial and pleasant Neck: Supple. JVD not elevated. Lungs: Some coarse BS, occ insp short wheeze  Heart: RRR with S1 S2. No murmurs, rubs, or gallops appreciated. Abdomen: Soft, non-distended with normoactive bowel sounds. Lap incision sites intact  Lower extremities:without edema or ischemic changes, no open wounds  Neuro: Alert and oriented X 3. Moves all extremities spontaneously. Psych: Responds to questions appropriately with a normal affect. Dialysis Access:left upper AVF currently accessed for dialysis   Labs: Basic Metabolic Panel:  Recent Labs Lab 11/25/15 0628 11/25/15 1647 11/26/15 0734  NA 141 141 137  K 4.2 5.4* 4.7  CL  --  100* 103  CO2  --  25 22  GLUCOSE 85 96 149*  BUN  --  24* 32*  CREATININE  --  6.61* 7.82*  CALCIUM  --  9.3 9.8  PHOS  --  6.2* 7.2*     Recent Labs Lab 11/25/15 1647 11/26/15 0734  ALBUMIN 3.3* 3.1*     Recent Labs Lab 11/25/15 0628 11/25/15 1647 11/26/15 0733  WBC  --  9.8 6.2  NEUTROABS  --   --  3.7  HGB 11.6* 11.1* 10.0*  HCT 34.0* 34.3* 31.7*  MCV  --  102.7*  101.0*  PLT  --  163 151   Medications:   . acetaminophen  1,000 mg Oral TID  . calcium acetate  2,001 mg Oral TID WC  . doxercalciferol  2 mcg Intravenous Q M,W,F-HD  . doxycycline  100 mg Oral BID  . enoxaparin (LOVENOX) injection  30 mg Subcutaneous Q24H  . ferric gluconate (FERRLECIT/NULECIT) IV  62.5 mg Intravenous Q Fri-HD  . finasteride  5 mg Oral Daily  . furosemide  40 mg Oral BID  . metoprolol succinate  50 mg Oral QHS  . multivitamin  1 tablet Oral QHS  . sodium chloride flush  3 mL Intravenous Q12H  . tamsulosin  0.4 mg Oral BID    Dialysis Orders: [facility] 4 hr EDW 64 3k 2.25 Ca 400/800 heparin 2000 hectorol 2 venofer 50/Friday Mircera 50 q 6 weeks - last 2/10 last hgb 11 P 6-7 iPTH 237 Ca ok AccessL left upper AVF  Assessment/Plan: 1. s/p bilateral hernia repair and excision of hydrocele- per Dr. Michaell Cowing; Got ancef yesterday (and is on doxycycline - not sure why)  Dr. Michaell Cowing - does he need to go home on any antibiotics?? 2. ESRD - MWF - on HD now - no heparin today  - pre HD K 4.7. (3K bath as outpt but needs 2K today).    3. Hypertension/volume - controlled with MTP and volume reduction on HD. No idea why he is on lasix. Will stop that. 4. Anemia - stable hgb - not due for ESA redose- dose weekly Fe; post op labs being drawn 5. M etabolic bone disease - Continue hectorol/binders 6. Nutrition - CL - added multivits - advance to renal diet as able 7. Tobacco abuse - encouraged cessation  Camille Bal, MD [facility] [phone] pager 11/26/2015, 8:35 AM

## 2015-11-26 NOTE — Progress Notes (Signed)
Patient continued to get more confused and agitated throughout the day.  Per Dr. Gordy Savers recommendation, I followed up with Nephrologist, Dr. Eliott Nine who felt the only thing she could identify to cause the change would be the IV Benadryl he received during dialysis session.  Also called patient's son, Freida Busman, to come see him, who verbalized that this is not at all his father's baseline.  Patient was not even able to stand to transfer from bed to chair.  He follows simple commands and has strong grips bilaterally.  He is reaching for things that are not there and pulling on gown, lines, etc.  Vital signs reassessed and patient was found to be slightly hypoxic at 85% on room air.  2L O2 via Nasal Cannula applied and sats returned to 99%.  All other vitals within normal limits.  Dr. Lindie Spruce was notified and gave new orders. Will continue to monitor.

## 2015-11-26 NOTE — Progress Notes (Addendum)
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  Recommendations were made.  Questions were answered.  The patient expressed understanding & appreciation.   Adin Hector, M.D., F.A.C.S. Gastrointestinal and Minimally Invasive Surgery [facility] [address] [place] [address] [place], [place] [address] [phone] Main / Paging   11/26/2015  Subjective:  In HD Smiling Denies pain Hungry  Objective:  Vital signs:  Filed Vitals:   11/26/15 0706 11/26/15 0719 11/26/15 0721 11/26/15 0730  BP: 116/77 106/68 106/70 99/63  Pulse: 70 63 65 73  Temp: 98.1 F (36.7 C)     TempSrc: Oral     Resp: 20     Height:      Weight: 62.6 kg (138 lb 0.1 oz)     SpO2: 100%          Intake/Output   Yesterday:  02/16 0701 - 02/17 0700 In: 4492 [P.O.:365; I.V.:1225] Out: 21 [Urine:1; Blood:20] This shift:     Bowel function:  Flatus: y  BM: n  Drain: n/a  Physical Exam:  General: Pt awake/alert/oriented x4 in no acute distress.   Smiling Eyes: PERRL, normal EOM.  Sclera clear.  No icterus Neuro: CN II-XII intact w/o focal sensory/motor deficits. Lymph: No head/neck/groin lymphadenopathy Psych:  No delerium/psychosis/paranoia HENT: Normocephalic, Mucus membranes moist.  No thrush Neck: Supple, No tracheal deviation Chest: No chest wall pain w good excursion CV:  Pulses intact.  Regular rhythm MS: Normal AROM mjr joints.  No obvious deformity Abdomen: Soft.  Nondistended.  Mildly tender at incisions only.  No evidence of peritonitis.  No incarcerated hernias. GU:  NO hernias.  No seroma/hematoma Ext:  SCDs BLE.  No mjr edema.  No cyanosis Skin: No petechiae / purpura  Results:   Labs: Results for orders placed or performed during the hospital encounter of 11/25/15 (from the past 48 hour(s))  I-STAT 4, (NA,K, GLUC, HGB,HCT)     Status: Abnormal   Collection Time: 11/25/15  6:28 AM  Result Value Ref Range   Sodium 141 135 - 145 mmol/L   Potassium 4.2 3.5 - 5.1 mmol/L   Glucose, Bld 85 65 - 99 mg/dL   HCT 34.0 (L) 39.0 - 52.0 %   Hemoglobin 11.6 (L) 13.0 - 17.0 g/dL  Renal function panel     Status: Abnormal   Collection Time: 11/25/15  4:47 PM  Result Value Ref Range   Sodium 141 135 - 145 mmol/L   Potassium 5.4 (H) 3.5 - 5.1 mmol/L   Chloride 100 (L) 101 - 111 mmol/L   CO2 25 22 -  32 mmol/L   Glucose, Bld 96 65 - 99 mg/dL   BUN 24 (H) 6 - 20 mg/dL   Creatinine, Ser 6.61 (H) 0.61 - 1.24 mg/dL   Calcium 9.3 8.9 - 10.3 mg/dL   Phosphorus 6.2 (H) 2.5 - 4.6 mg/dL   Albumin 3.3 (L) 3.5 - 5.0 g/dL   GFR calc non Af Amer 7 (L) >60 mL/min   GFR calc Af Amer 9 (L) >60 mL/min    Comment: (NOTE) The eGFR has been calculated using the CKD EPI equation. This calculation has not been validated in all clinical situations. eGFR's persistently <60 mL/min signify possible Chronic Kidney Disease.    Anion gap 16 (H) 5 - 15  CBC     Status: Abnormal   Collection Time: 11/25/15  4:47 PM  Result Value Ref Range    WBC 9.8 4.0 - 10.5 K/uL   RBC 3.34 (L) 4.22 - 5.81 MIL/uL   Hemoglobin 11.1 (L) 13.0 - 17.0 g/dL   HCT 34.3 (L) 39.0 - 52.0 %   MCV 102.7 (H) 78.0 - 100.0 fL   MCH 33.2 26.0 - 34.0 pg   MCHC 32.4 30.0 - 36.0 g/dL   RDW 14.0 11.5 - 15.5 %   Platelets 163 150 - 400 K/uL  CBC with Differential/Platelet     Status: Abnormal   Collection Time: 11/26/15  7:33 AM  Result Value Ref Range   WBC 6.2 4.0 - 10.5 K/uL   RBC 3.14 (L) 4.22 - 5.81 MIL/uL   Hemoglobin 10.0 (L) 13.0 - 17.0 g/dL   HCT 31.7 (L) 39.0 - 52.0 %   MCV 101.0 (H) 78.0 - 100.0 fL   MCH 31.8 26.0 - 34.0 pg   MCHC 31.5 30.0 - 36.0 g/dL   RDW 14.0 11.5 - 15.5 %   Platelets 151 150 - 400 K/uL   Neutrophils Relative % 59 %   Neutro Abs 3.7 1.7 - 7.7 K/uL   Lymphocytes Relative 16 %   Lymphs Abs 1.0 0.7 - 4.0 K/uL   Monocytes Relative 11 %   Monocytes Absolute 0.7 0.1 - 1.0 K/uL   Eosinophils Relative 14 %   Eosinophils Absolute 0.8 (H) 0.0 - 0.7 K/uL   Basophils Relative 0 %   Basophils Absolute 0.0 0.0 - 0.1 K/uL    Imaging / Studies: No results found.  Medications / Allergies: per chart  Antibiotics: Anti-infectives    Start     Dose/Rate Route Frequency Ordered Stop   11/25/15 2200  doxycycline (VIBRA-TABS) tablet 100 mg     100 mg Oral 2 times daily 11/25/15 1637     11/25/15 1645  ceFAZolin (ANCEF) IVPB 2 g/50 mL premix  Status:  Discontinued     2 g 100 mL/hr over 30 Minutes Intravenous 3 times per day 11/25/15 1637 11/25/15 1647   11/25/15 0700  ceFAZolin (ANCEF) IVPB 2 g/50 mL premix     2 g 100 mL/hr over 30 Minutes Intravenous To [facility] 11/24/15 1233 11/25/15 0810        Note: Portions of this report may have been transcribed using voice recognition software. Every effort was made to ensure accuracy; however, inadvertent computerized transcription errors may be present.   Any transcriptional errors that result from this process are unintentional.     Adin Hector, M.D.,  F.A.C.S. Gastrointestinal and Minimally Invasive Surgery [facility] [address], [place], [place] [address] [phone] Main / Paging   11/26/2015  CARE TEAM:  PCP: Leonides Sake, MD  Outpatient Care Team: Patient Care Team: Leonides Sake, MD as PCP - General Donato Heinz, MD as Consulting Physician (Nephrology) Conrad Avoyelles, MD as Consulting Physician (Vascular Surgery) Michael Boston, MD as Consulting Physician (General Surgery)  Inpatient Treatment Team: Treatment Team: Attending Provider: Michael Boston, MD; Registered Nurse: Renato Shin, RN; Consulting Physician: Edrick Oh, MD; Consulting Physician: Jamal Maes, MD; Registered Nurse: Kennith Center, RN; Technician: Paula Compton, NT; Technician: Nino Glow, NT; Registered Nurse: Shirleen Schirmer, RN; Registered Nurse: Micki Riley, RN; Technician: Francene Finders, NT

## 2015-11-26 NOTE — Procedures (Signed)
I have personally attended this patient's dialysis session.   No heparin today since 1 day post op Continue tight as outpt 2K bath today for K 4.7 (usually on 3K at his center) L AVF 400   Camille Bal, MD [facility] [phone] Pager 11/26/2015, 8:50 AM

## 2015-11-26 NOTE — Progress Notes (Signed)
Patient started treatment at 0719 alert, oriented to person, situation and calm, at 1000 patient became restless, itching and scratching vigorously on arms and thighs and complaining of feeling uncomfortable and attempting to climb over the side rails and  Redirected and medicated with benadryl for itching patient calmed down , became drowsy and talking in his sleep and picking in the air and confusion increased. Patient infiltrated arterial site and pulled iv-sl out of right forearm tx ended . Dr Eliott Nine notified of patient condition and reported to floor nurse

## 2015-11-26 NOTE — Progress Notes (Signed)
Patient returned from dialysis and is confused, itching, pulling clothes off, unable to answer questions appropriately.  Refused all medication.  VSS.  Discharge order was entered by Dr. Michaell Cowing this morning, but I have called his office to follow up due to concern that he may not be safe to return home right now.

## 2015-11-27 ENCOUNTER — Ambulatory Visit (HOSPITAL_COMMUNITY): Payer: Medicare Other

## 2015-11-27 DIAGNOSIS — A419 Sepsis, unspecified organism: Secondary | ICD-10-CM | POA: Diagnosis not present

## 2015-11-27 DIAGNOSIS — N186 End stage renal disease: Secondary | ICD-10-CM | POA: Diagnosis not present

## 2015-11-27 DIAGNOSIS — G9340 Encephalopathy, unspecified: Secondary | ICD-10-CM | POA: Diagnosis not present

## 2015-11-27 DIAGNOSIS — I517 Cardiomegaly: Secondary | ICD-10-CM | POA: Diagnosis not present

## 2015-11-27 DIAGNOSIS — Z992 Dependence on renal dialysis: Secondary | ICD-10-CM | POA: Diagnosis not present

## 2015-11-27 LAB — COMPREHENSIVE METABOLIC PANEL
ALBUMIN: 3.1 g/dL — AB (ref 3.5–5.0)
ALK PHOS: 50 U/L (ref 38–126)
ALT: 6 U/L — AB (ref 17–63)
ANION GAP: 13 (ref 5–15)
AST: 21 U/L (ref 15–41)
BUN: 18 mg/dL (ref 6–20)
CALCIUM: 9.2 mg/dL (ref 8.9–10.3)
CO2: 30 mmol/L (ref 22–32)
CREATININE: 6.53 mg/dL — AB (ref 0.61–1.24)
Chloride: 97 mmol/L — ABNORMAL LOW (ref 101–111)
GFR, EST AFRICAN AMERICAN: 9 mL/min — AB (ref >60)
GFR, EST NON AFRICAN AMERICAN: 7 mL/min — AB (ref >60)
Glucose, Bld: 97 mg/dL (ref 65–99)
Potassium: 4 mmol/L (ref 3.5–5.1)
SODIUM: 140 mmol/L (ref 135–145)
Total Bilirubin: 0.6 mg/dL (ref 0.3–1.2)
Total Protein: 6.3 g/dL — ABNORMAL LOW (ref 6.5–8.1)

## 2015-11-27 LAB — CBC
HEMATOCRIT: 30.4 % — AB (ref 39.0–52.0)
HEMOGLOBIN: 9.5 g/dL — AB (ref 13.0–17.0)
MCH: 31.6 pg (ref 26.0–34.0)
MCHC: 31.3 g/dL (ref 30.0–36.0)
MCV: 101 fL — ABNORMAL HIGH (ref 78.0–100.0)
Platelets: 125 10*3/uL — ABNORMAL LOW (ref 150–400)
RBC: 3.01 MIL/uL — AB (ref 4.22–5.81)
RDW: 13.9 % (ref 11.5–15.5)
WBC: 7.2 10*3/uL (ref 4.0–10.5)

## 2015-11-27 LAB — TROPONIN I: TROPONIN I: 0.07 ng/mL — AB (ref <0.031)

## 2015-11-27 LAB — VITAMIN B12: VITAMIN B 12: 519 pg/mL (ref 180–914)

## 2015-11-27 LAB — MAGNESIUM: Magnesium: 2 mg/dL (ref 1.7–2.4)

## 2015-11-27 LAB — RPR: RPR: NONREACTIVE

## 2015-11-27 MED ORDER — IPRATROPIUM-ALBUTEROL 0.5-2.5 (3) MG/3ML IN SOLN: 3.0000 mL | Freq: Four times a day (QID) | RESPIRATORY_TRACT | Status: DC

## 2015-11-27 MED ORDER — IPRATROPIUM-ALBUTEROL 0.5-2.5 (3) MG/3ML IN SOLN: 3.0000 mL | Freq: Four times a day (QID) | RESPIRATORY_TRACT | Status: DC | PRN

## 2015-11-27 NOTE — Progress Notes (Signed)
Pt very confused and having visual hallucinations. MD aware

## 2015-11-27 NOTE — Progress Notes (Deleted)
Pt waiting for her ride home this pm. Discharge instructions provided with no concerns voiced

## 2015-11-27 NOTE — Progress Notes (Signed)
2 Days Post-Op  Subjective: Less alert and more confused this morning.  This is different than his mental status yesterday morning according to nursing staff. Received dialysis yesterday and was seen by Dr. Hyman Hopes and Dr. Eliott Nine  Apparently was jovial and interactive yesterday. Now with depressed mental status but does arouse and follow commands without focal findings. Medical record does not reveal any narcotics given in the last 24 hours BP 132/78.  Respirations 19.  SPO2 100% on 2 L nasal cannula area afebrile.  Heart rate 88 Plan consultation with an internal medicine.   Objective: Vital signs in last 24 hours: Temp:  [98.1 F (36.7 C)-99.5 F (37.5 C)] 98.6 F (37 C) (02/18 0500) Pulse Rate:  [62-97] 88 (02/18 0500) Resp:  [14-20] 19 (02/18 0500) BP: (99-140)/(47-89) 132/78 mmHg (02/18 0500) SpO2:  [85 %-100 %] 100 % (02/18 0500) Weight:  [62.6 kg (138 lb 0.1 oz)-64.7 kg (142 lb 10.2 oz)] 64.7 kg (142 lb 10.2 oz) (02/17 1130) Last BM Date:  (PTA)  Intake/Output from previous day: 02/17 0701 - 02/18 0700 In: 355 [P.O.:355] Out: 1229  Intake/Output this shift: Total I/O In: 175 [P.O.:175] Out: 0    EXAM: General appearance: Elderly.  Arousable.  Mumbles answers to questions.  Difficult to understand.  No facial asymmetry. Eyes: conjunctivae/corneas clear. PERRL, EOM's intact. Fundi benign., Pupils equal.  Gaze recently reasonably conjugate.  Will not follow my finger to command. Resp: clear to auscultation bilaterally GI: soft, non-tender; bowel sounds normal; no masses,  no organomegaly.  Laparoscopic incisions look fine.  Nondistended. Male genitalia: No significant swelling or hematoma. Neurologic: Squeezes both hands to command with good strength.  Will lift legs to command.  Inattentive.  Had to be instructed to let them back down.  Lab Results:  Results for orders placed or performed during the hospital encounter of 11/25/15 (from the past 24 hour(s))  CBC with  Differential/Platelet     Status: Abnormal   Collection Time: 11/26/15  7:33 AM  Result Value Ref Range   WBC 6.2 4.0 - 10.5 K/uL   RBC 3.14 (L) 4.22 - 5.81 MIL/uL   Hemoglobin 10.0 (L) 13.0 - 17.0 g/dL   HCT 69.6 (L) 29.5 - 28.4 %   MCV 101.0 (H) 78.0 - 100.0 fL   MCH 31.8 26.0 - 34.0 pg   MCHC 31.5 30.0 - 36.0 g/dL   RDW 13.2 44.0 - 10.2 %   Platelets 151 150 - 400 K/uL   Neutrophils Relative % 59 %   Neutro Abs 3.7 1.7 - 7.7 K/uL   Lymphocytes Relative 16 %   Lymphs Abs 1.0 0.7 - 4.0 K/uL   Monocytes Relative 11 %   Monocytes Absolute 0.7 0.1 - 1.0 K/uL   Eosinophils Relative 14 %   Eosinophils Absolute 0.8 (H) 0.0 - 0.7 K/uL   Basophils Relative 0 %   Basophils Absolute 0.0 0.0 - 0.1 K/uL  Renal function panel     Status: Abnormal   Collection Time: 11/26/15  7:34 AM  Result Value Ref Range   Sodium 137 135 - 145 mmol/L   Potassium 4.7 3.5 - 5.1 mmol/L   Chloride 103 101 - 111 mmol/L   CO2 22 22 - 32 mmol/L   Glucose, Bld 149 (H) 65 - 99 mg/dL   BUN 32 (H) 6 - 20 mg/dL   Creatinine, Ser 7.25 (H) 0.61 - 1.24 mg/dL   Calcium 9.8 8.9 - 36.6 mg/dL   Phosphorus 7.2 (H) 2.5 -  4.6 mg/dL   Albumin 3.1 (L) 3.5 - 5.0 g/dL   GFR calc non Af Amer 6 (L) >60 mL/min   GFR calc Af Amer 7 (L) >60 mL/min   Anion gap 12 5 - 15  CBC     Status: Abnormal   Collection Time: 11/26/15  7:32 PM  Result Value Ref Range   WBC 8.0 4.0 - 10.5 K/uL   RBC 3.16 (L) 4.22 - 5.81 MIL/uL   Hemoglobin 9.9 (L) 13.0 - 17.0 g/dL   HCT 16.1 (L) 09.6 - 04.5 %   MCV 100.3 (H) 78.0 - 100.0 fL   MCH 31.3 26.0 - 34.0 pg   MCHC 31.2 30.0 - 36.0 g/dL   RDW 40.9 81.1 - 91.4 %   Platelets 125 (L) 150 - 400 K/uL  Blood gas, arterial     Status: Abnormal   Collection Time: 11/26/15 10:10 PM  Result Value Ref Range   O2 Content 2.0 L/min   Delivery systems NASAL CANNULA    pH, Arterial 7.490 (H) 7.350 - 7.450   pCO2 arterial 38.1 35.0 - 45.0 mmHg   pO2, Arterial 100 80.0 - 100.0 mmHg   Bicarbonate 28.7 (H)  20.0 - 24.0 mEq/L   TCO2 29.9 0 - 100 mmol/L   Acid-Base Excess 5.3 (H) 0.0 - 2.0 mmol/L   O2 Saturation 98.0 %   Patient temperature 98.6    Collection site RIGHT BRACHIAL    Drawn by [id number]    Sample type ARTERIAL DRAW    Allens test (pass/fail) PASS PASS     Studies/Results: No results found.  Marland Kitchen acetaminophen  1,000 mg Oral TID  . calcium acetate  2,001 mg Oral TID WC  . doxercalciferol  2 mcg Intravenous Q M,W,F-HD  . doxycycline  100 mg Oral BID  . enoxaparin (LOVENOX) injection  30 mg Subcutaneous Q24H  . ferric gluconate (FERRLECIT/NULECIT) IV  62.5 mg Intravenous Q Fri-HD  . finasteride  5 mg Oral Daily  . furosemide  40 mg Oral BID  . metoprolol succinate  50 mg Oral QHS  . multivitamin  1 tablet Oral QHS  . sodium chloride flush  3 mL Intravenous Q12H  . tamsulosin  0.4 mg Oral BID     Assessment/Plan: s/p Procedure(s): LAPAROSCOPIC BILATERAL INGUINAL HERNIA REPAIR--EXCISION OF RIGHT HYDROCELE INSERTION OF MESH  POD #2.  Laparoscopic repair bilateral inguinal hernia with mesh, excision right hydrocele Surgically no apparent problems No indication for antibiotics for a surgical standpoint  Altered mental status.  Etiology unclear.  This is apparently a significant change from yesterday without apparent medication etiology. Nonfocal Have ordered CBC, cemented, magnesium level. ABG was apparently done at 10 PM last night and this looks fine.  No hypoxia.  No acidosis. Spoke with Dr. Maryfrances Bunnell from [facility].  They will see this morning Will order stat blood cultures to assess for occult sepsis Hold off on CT brain since nonfocal  ESRD.  Dialysis yesterday Hypertension Tobacco abuse Metabolic bone disease Chronic anemia    @     Shane [name] M 11/27/2015  . .prob

## 2015-11-27 NOTE — Progress Notes (Signed)
[facility] Rounding Note  Subjective:  Issues with confusion yesterday that started after getting some IV benadryl in HD for itching. (Had some intense itching, got benadryl, became very drowsy after, said to be talking in his sleep, and picking in the air) - infiltrated his access as a result. Unfortunately did not get better and in fact worse over night, ABG looked OK, white count normal LF's normal TRH has now seen and has ordered infectious workup and ECG. They mention ? of extra dialysis but he is well dialyzed - this is not uremia.   When I ask him where he is "I got some surgery and then I went back"  And then laughed   Objective Vital signs in last 24 hours: Filed Vitals:   11/26/15 1835 11/26/15 2208 11/27/15 0227 11/27/15 0500  BP:  120/71 134/81 132/78  Pulse:  97 86 88  Temp:  99.5 F (37.5 C) 98.8 F (37.1 C) 98.6 F (37 C)  TempSrc:  Oral Oral Oral  Resp:  Height:      Weight:      SpO2: 98% 100% 100% 100%   Weight change: -0.904 kg (-1 lb 15.9 oz)  Intake/Output Summary (Last 24 hours) at 11/27/15 1113 Last data filed at 11/27/15 0916  Gross per 24 hour  Intake    415 ml  Output      0 ml  Net    415 ml    Physical Exam:  BP 132/78 mmHg  Pulse 88  Temp(Src) 98.6 F (37 C) (Oral)  Resp 19  Ht 5' 10.5" (1.791 m)  Wt 64.7 kg (142 lb 10.2 oz)  BMI 20.17 kg/m2  SpO2 100% General: Slender elderly AAM  Turned crossways in the bed and trying to crawl out of the bed Won't wear his nasal cannula which is in his mouth Neck: Supple. JVD not elevated. Lungs:Clear but BS coarse Heart: RRR with S1 S2. No murmurs, rubs, or gallops appreciated. Abdomen: Soft, non-distended with normoactive bowel sounds. Lap incision sites intact - small bandages in place  Lower extremities:without edema or ischemic changes, no open wounds  Neuro: Able to follow commands but not able to provide sensible answers to questions Psych: Responds to questions appropriately with a  normal affect. Dialysis Access:left upper AVF+ bruit with dressing on it. Patent despite yesterday's infiltration.   Labs: Basic Metabolic Panel:  Recent Labs Lab 11/25/15 0628 11/25/15 1647 11/26/15 0734 11/27/15 0629  NA 141 141 137 140  K 4.2 5.4* 4.7 4.0  CL  --  100* 103 97*  CO2  --  GLUCOSE 85 96 149* 97  BUN  --  24* 32* 18  CREATININE  --  6.61* 7.82* 6.53*  CALCIUM  --  9.3 9.8 9.2  PHOS  --  6.2* 7.2*  --      Recent Labs Lab 11/25/15 1647 11/26/15 0734 11/27/15 0629  AST  --   --  21  ALT  --   --  6*  ALKPHOS  --   --  50  BILITOT  --   --  0.6  PROT  --   --  6.3*  ALBUMIN 3.3* 3.1* 3.1*     Recent Labs Lab 11/25/15 1647 11/26/15 0733 11/26/15 1932 11/27/15 0629  WBC 9.8 6.2 8.0 7.2  NEUTROABS  --  3.7  --   --   HGB 11.1* 10.0* 9.9* 9.5*  HCT 34.3* 31.7* 31.7* 30.4*  MCV  102.7* 101.0* 100.3* 101.0*  PLT 163 151 125* 125*   Medications:   . acetaminophen  1,000 mg Oral TID  . calcium acetate  2,001 mg Oral TID WC  . doxercalciferol  2 mcg Intravenous Q M,W,F-HD  . doxycycline  100 mg Oral BID  . enoxaparin (LOVENOX) injection  30 mg Subcutaneous Q24H  . ferric gluconate (FERRLECIT/NULECIT) IV  62.5 mg Intravenous Q Fri-HD  . finasteride  5 mg Oral Daily  . furosemide  40 mg Oral BID  . metoprolol succinate  50 mg Oral QHS  . multivitamin  1 tablet Oral QHS  . sodium chloride flush  3 mL Intravenous Q12H  . tamsulosin  0.4 mg Oral BID    Dialysis Orders: [facility] 4 hr EDW 64 3k 2.25 Ca 400/800 heparin 2000 hectorol 2 venofer 50/Friday Mircera 50 q 6 weeks - last 2/10 last hgb 11 P 6-7 iPTH 237 Ca ok AccessL left upper AVF  Assessment/Plan: 1. s/p bilateral hernia repair and excision of hydrocele- per Dr. Michaell Cowing; rec'd ancef 2/16 and is on doxycycline - not sure why)  2. ESRD - MWF    3. Acute encephalopathy. No clue as to etiology at present. Started yesterday after IV benadryl . Infection workup underway. Notably desats if  O2 is off. CT if no improvement.  4. Hypertension/volume - controlled with MTP and volume reduction on HD. No idea why he is on lasix. Will stop that. 5. Anemia - stable hgb - not due for ESA redose- dose weekly Fe; post op labs being drawn 6. Metabolic bone disease - Continue hectorol/binders 7. Nutrition - CL - added multivits - advance to renal diet as able 8. Tobacco abuse - encouraged cessation  Camille Bal, MD [facility] [phone] pager 11/27/2015, 11:13 AM

## 2015-11-27 NOTE — Consult Note (Signed)
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 Acute encephalopathy. Etiology unclear. No indication for infection. No hypoxia per ABG last evening. Some metabolic alkalosis.  No narcotics for 24 hours. He's afebrile nontoxic appearing. Hemodynamically stable. Neuro exam benign. More alert this a.m. but remains confused. Nursing staff report during the night much more confused.  -Obtain chest x-ray -Follow blood cultures -Obtain a B-12 folate RPR -Urinalysis if he makes any urine which he does regularly -EKG -Hold any sedating medications -? dialysis -If no improvement consider CT of head  #2. Chronic kidney disease stage V. He is a dialysis patient he underwent dialysis yesterday. Creatinine within his baseline range -Continue dialysis per nephrology  3. Hypertension. Controlled.  #4. Anemia. Macrocytic. Current hemoglobin 9.5 down from 11.1 2 days ago. Likely related to surgery in the setting of chronic disease. No signs of active bleeding -Monitor    [facility] again tomorrow. Please contact  if I can be of assistance in the meanwhile. Thank you for this consultation.  Chief Complaint: Acute encephalopathy  HPI: Very pleasant 75 year old history hypertension right kidney disease on dialysis status post laparoscopic bilateral inguinal hernia repair and excision of right hydrocele with  insertion of mesh 2 days ago. Doing well and scheduled for discharge yesterday after dialysis when he became confused restless complained of itching on arms and thighs attempting to get out of bed. He was provided with Benadryl for itching calm down and became drowsy/lethargic. During the night he remained quite confused but cooperative somewhat lethargic.  At the time of my exam he was alert sitting up mumbled answers but followed commands. Laughing during exam.   Review of Systems:  Unable to complete 10 point review of systems at this time due to acute encephalopathy  Past Medical History  Diagnosis Date  . Hypertension   . Incarcerated inguinal hernia, unilateral     right side  . Tobacco abuse   . Varicocele   . Arthritis     bursitis in hip  . 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 Chronic kidney disease     secondary to amyloidosis AL lambda phenotype, HD MWF Dr. Reynolds Bowl  . Full dentures   . Urinary tract infection due to Enterococcus 08/03/2015   Past Surgical History  Procedure Laterality Date  . Av fistula placement, radiocephalic  01/30/2011    Right w/ ligation of competing venous branch by Dr. Leonides Sake  . Av fistula placement, brachiocephalic  10/19/2010    LEFT  . Av fistula placement, radiocephalic  09/13/2010    LEFT  . Colonoscopy    . Bascilic vein transposition Left 04/08/2015    Procedure: BASILIC VEIN TRANSPOSITION;  Surgeon: Pryor Ochoa, MD;  Location: [facility] OR;  Service: Vascular;  Laterality: Left;  Marland Kitchen Multiple tooth extractions     Social History:  reports that he has been smoking Cigarettes.  He has a 25 pack-year smoking  history. He has never used smokeless tobacco. He reports that he does not drink alcohol or use illicit drugs.  No Known Allergies Family History  Problem Relation Age of Onset  . Asthma Mother   . Hypertension Mother   . Alcohol abuse Father   . Hypertension Father   .  Hypertension Sister   . Diabetes Sister   . Heart disease Sister   . Hypertension Brother   . Peripheral vascular disease Brother   . Hypertension Daughter   . Heart disease Daughter     Prior to Admission medications   Medication Sig Start Date End Date Taking? Authorizing Provider  amLODipine (NORVASC) 10 MG tablet Take 1 tablet (10 mg total) by mouth daily. 02/12/13  Yes Genelle Gather, MD  B Complex-C-Folic Acid (RENAL MULTIVITAMIN FORMULA PO) Take 500 mg by mouth daily.   Yes Historical Provider, MD  calcium acetate (PHOSLO) 667 MG capsule Take 2,001 mg by mouth 3 (three) times daily with meals.    Yes Historical Provider, MD  doxycycline (VIBRAMYCIN) 100 MG capsule Take 100 mg by mouth 2 (two) times daily.   Yes Historical Provider, MD  finasteride (PROSCAR) 5 MG tablet Take 5 mg by mouth daily.   Yes Historical Provider, MD  furosemide (LASIX) 40 MG tablet Take 1 tablet (40 mg total) by mouth daily. Patient taking differently: Take 40 mg by mouth 2 (two) times daily.  04/15/15  Yes Belkys A Regalado, MD  metoprolol succinate (TOPROL-XL) 50 MG 24 hr tablet Take 50 mg by mouth daily. Take with or immediately following a meal.   Yes Historical Provider, MD  Tamsulosin HCl (FLOMAX) 0.4 MG CAPS Take 0.4 mg by mouth 2 (two) times daily.    Yes Historical Provider, MD  albuterol-ipratropium (COMBIVENT) 18-103 MCG/ACT inhaler Inhale 2 puffs into the lungs every 6 (six) hours as needed for wheezing. 02/11/13   Genelle Gather, MD  traMADol (ULTRAM) 50 MG tablet Take 1-2 tablets (50-100 mg total) by mouth every 6 (six) hours as needed for moderate pain or severe pain. 11/25/15   Karie Soda, MD   Physical Exam: Blood pressure 132/78, pulse 88, temperature 98.6 F (37 C), temperature source Oral, resp. rate 19, height 5' 10.5" (1.791 m), weight 64.7 kg (142 lb 10.2 oz), SpO2 100 %. Filed Vitals:   11/27/15 0227 11/27/15 0500  BP: 134/81 132/78  Pulse: 86 88  Temp: 98.8 F (37.1 C) 98.6 F (37  C)  Resp: 17 19     General:  Awake alert thin somewhat frail-appearing cooperative  Eyes: Pupils equal round reactive to light EOMI no scleral icterus  ENT: Ears clear nose without drainage oropharynx without erythema or exudate. Mucous membranes of his mouth pink slightly dry  Neck: Supple no JVD full range of motion  Cardiovascular: Regular rate and rhythm no murmur gallop or rub no lower extremity edema  Respiratory: Normal effort breath sounds with good air flow somewhat coarse no crackles  Abdomen: Flat soft nontender no guarding incision clean and dry  Skin: Warm and dry no rash or lesions  Musculoskeletal: Joints without swelling/erythema moves all extremities  Psychiatric: Cooperative  Neurologic: Speech somewhat low garbled. Follows commands. Oriented to self only. Bilateral grip 5 out of 5 lower extremity strength 5 out of 5  Labs on Admission:  Basic Metabolic Panel:  Recent Labs Lab 11/25/15 0628 11/25/15 1647 11/26/15 0734 11/27/15 0629  NA 141 141 137 140  K 4.2 5.4* 4.7 4.0  CL  --  100* 103 97*  CO2  --  25 22 30   GLUCOSE 85 96 149* 97  BUN  --  24* 32* 18  CREATININE  --  6.61* 7.82* 6.53*  CALCIUM  --  9.3 9.8 9.2  MG  --   --   --  2.0  PHOS  --  6.2* 7.2*  --    Liver Function Tests:  Recent Labs Lab 11/25/15 1647 11/26/15 0734 11/27/15 0629  AST  --   --  21  ALT  --   --  6*  ALKPHOS  --   --  50  BILITOT  --   --  0.6  PROT  --   --  6.3*  ALBUMIN 3.3* 3.1* 3.1*   No results for input(s): LIPASE, AMYLASE in the last 168 hours. No results for input(s): AMMONIA in the last 168 hours. CBC:  Recent Labs Lab 11/25/15 0628 11/25/15 1647 11/26/15 0733 11/26/15 1932 11/27/15 0629  WBC  --  9.8 6.2 8.0 7.2  NEUTROABS  --   --  3.7  --   --   HGB 11.6* 11.1* 10.0* 9.9* 9.5*  HCT 34.0* 34.3* 31.7* 31.7* 30.4*  MCV  --  102.7* 101.0* 100.3* 101.0*  PLT  --  163 151 125* 125*   Cardiac Enzymes: No results for input(s):  CKTOTAL, CKMB, CKMBINDEX, TROPONINI in the last 168 hours. BNP: Invalid input(s): POCBNP CBG: No results for input(s): GLUCAP in the last 168 hours.  Radiological Exams on Admission: No results found.  EKG: pending  Time spent: 55 minutes  [facility]   If 7PM-7AM, please contact night-coverage [email or web address] Password [facility] 11/27/2015, 8:19 AM  I have examined the patient, reviewed the chart and modified the above note which I agree with. Acute onset confusion. He is quite alert today but very restless and oriented only to person. He is needing to be fed but the staff this morning.  Confusion started with itching during dialysis and became worse after receiving Benadryl which may be the cause but it appears he received a dose of Benadryl the day before as well without any reports of confusion. I don't see on the med sheet that he received any narcotics in the past 24 hrs.  F/u infectious work up. If no cause found, would obtain a CT of the head.   Delisa Finck,MD Pager # [phone] [email or web address] 11/27/2015, 9:03 AM

## 2015-11-28 DIAGNOSIS — G9340 Encephalopathy, unspecified: Secondary | ICD-10-CM | POA: Diagnosis not present

## 2015-11-28 LAB — RENAL FUNCTION PANEL
ANION GAP: 16 — AB (ref 5–15)
Albumin: 3.5 g/dL (ref 3.5–5.0)
BUN: 28 mg/dL — AB (ref 6–20)
CHLORIDE: 100 mmol/L — AB (ref 101–111)
CO2: 27 mmol/L (ref 22–32)
Calcium: 9.6 mg/dL (ref 8.9–10.3)
Creatinine, Ser: 8.56 mg/dL — ABNORMAL HIGH (ref 0.61–1.24)
GFR calc Af Amer: 6 mL/min — ABNORMAL LOW (ref >60)
GFR calc non Af Amer: 5 mL/min — ABNORMAL LOW (ref >60)
Glucose, Bld: 83 mg/dL (ref 65–99)
POTASSIUM: 4 mmol/L (ref 3.5–5.1)
Phosphorus: 6.3 mg/dL — ABNORMAL HIGH (ref 2.5–4.6)
Sodium: 143 mmol/L (ref 135–145)

## 2015-11-28 LAB — CBC
HEMATOCRIT: 31.4 % — AB (ref 39.0–52.0)
HEMOGLOBIN: 10.2 g/dL — AB (ref 13.0–17.0)
MCH: 33.1 pg (ref 26.0–34.0)
MCHC: 32.5 g/dL (ref 30.0–36.0)
MCV: 101.9 fL — AB (ref 78.0–100.0)
Platelets: 142 10*3/uL — ABNORMAL LOW (ref 150–400)
RBC: 3.08 MIL/uL — ABNORMAL LOW (ref 4.22–5.81)
RDW: 13.8 % (ref 11.5–15.5)
WBC: 8.3 10*3/uL (ref 4.0–10.5)

## 2015-11-28 MED ORDER — HYDRALAZINE HCL 20 MG/ML IJ SOLN: 5.0000 mg | INTRAMUSCULAR | Status: DC | PRN

## 2015-11-28 MED ORDER — LORAZEPAM 2 MG/ML IJ SOLN: 0.5000 mg | Freq: Every day | INTRAMUSCULAR | Status: DC | PRN

## 2015-11-28 MED ORDER — LORAZEPAM 2 MG/ML IJ SOLN
0.5000 mg | Freq: Every day | INTRAMUSCULAR | Status: DC | PRN
  Administered 2015-11-29: 0.5 mg via INTRAMUSCULAR
  Filled 2015-11-28 (×2): qty 1

## 2015-11-28 MED ORDER — LORAZEPAM BOLUS VIA INFUSION: 0.5000 mg | Freq: Every day | INTRAVENOUS | Status: DC | PRN

## 2015-11-28 NOTE — Progress Notes (Signed)
3 Days Post-Op  Subjective: I appreciate all of the medical advice and input from Dr. Eliott Nine of nephrology and Dr. Patterson Hammersmith hospitalist regarding this patient's encephalopathy and medical problems.  No real cause identified.  Afebrile.  Vital signs stable.  He has much more talkative this morning but agitated.  He got out of bed and fell with minimal trauma.  He is now an upper extremity restraints.  He is aware that he is at [facility] but doesn't know why.  Denies that he drinks alcohol except rarely.  Objective: Vital signs in last 24 hours: Temp:  [98 F (36.7 C)-98.5 F (36.9 C)] 98.5 F (36.9 C) (02/19 0500) Pulse Rate:  [77-82] 77 (02/19 0500) Resp:  [18-20] 20 (02/19 0500) BP: (109-166)/(74-82) 109/74 mmHg (02/19 0500) SpO2:  [98 %-100 %] 100 % (02/19 0500) Last BM Date:  (PTA)  Intake/Output from previous day: 02/18 0701 - 02/19 0700 In: 180 [P.O.:180] Out: 0  Intake/Output this shift: Total I/O In: 15 [P.O.:15] Out: -    EXAM: General appearance: Elderly.Deconditioned.  Pressured speech.  Somewhat agitated and kicking the sheets off the bed Eyes: conjunctivae clear. PERRL, EOM's intact.  Gaze reasonably conjugate.  Answers simple questions. Resp: clear to auscultation bilaterally GI: soft, non-tender; bowel sounds normal; no masses, no organomegaly. Laparoscopic incisions look fine. Nondistended. Male genitalia: No significant swelling or hematoma. Neurologic: Squeezes both hands to command with good strength. Will lift legs to command. Inattentive.Oriented to person and place only.   Lab Results:  Results for orders placed or performed during the hospital encounter of 11/25/15 (from the past 24 hour(s))  Renal function panel     Status: Abnormal   Collection Time: 11/28/15  5:44 AM  Result Value Ref Range   Sodium 143 135 - 145 mmol/L   Potassium 4.0 3.5 - 5.1 mmol/L   Chloride 100 (L) 101 - 111 mmol/L   CO2 27 22 - 32 mmol/L   Glucose, Bld 83  65 - 99 mg/dL   BUN 28 (H) 6 - 20 mg/dL   Creatinine, Ser 0.98 (H) 0.61 - 1.24 mg/dL   Calcium 9.6 8.9 - 11.9 mg/dL   Phosphorus 6.3 (H) 2.5 - 4.6 mg/dL   Albumin 3.5 3.5 - 5.0 g/dL   GFR calc non Af Amer 5 (L) >60 mL/min   GFR calc Af Amer 6 (L) >60 mL/min   Anion gap 16 (H) 5 - 15  CBC     Status: Abnormal   Collection Time: 11/28/15  5:44 AM  Result Value Ref Range   WBC 8.3 4.0 - 10.5 K/uL   RBC 3.08 (L) 4.22 - 5.81 MIL/uL   Hemoglobin 10.2 (L) 13.0 - 17.0 g/dL   HCT 14.7 (L) 82.9 - 56.2 %   MCV 101.9 (H) 78.0 - 100.0 fL   MCH 33.1 26.0 - 34.0 pg   MCHC 32.5 30.0 - 36.0 g/dL   RDW 13.0 86.5 - 78.4 %   Platelets 142 (L) 150 - 400 K/uL     Studies/Results: No results found.  Marland Kitchen acetaminophen  1,000 mg Oral TID  . calcium acetate  2,001 mg Oral TID WC  . doxercalciferol  2 mcg Intravenous Q M,W,F-HD  . doxycycline  100 mg Oral BID  . enoxaparin (LOVENOX) injection  30 mg Subcutaneous Q24H  . ferric gluconate (FERRLECIT/NULECIT) IV  62.5 mg Intravenous Q Fri-HD  . finasteride  5 mg Oral Daily  . metoprolol succinate  50 mg Oral QHS  . multivitamin  1 tablet Oral QHS  . sodium chloride flush  3 mL Intravenous Q12H  . tamsulosin  0.4 mg Oral BID     Assessment/Plan: s/p Procedure(s): LAPAROSCOPIC BILATERAL INGUINAL HERNIA REPAIR--EXCISION OF RIGHT HYDROCELE INSERTION OF MESH  POD #3. Laparoscopic repair bilateral inguinal hernia with mesh, excision right hydrocele Surgically no apparent problems No indication for antibiotics for a surgical standpoint  Altered mental status. Etiology unclear. This is apparently a significant change from 2/17 without apparent medication etiology. Nonfocal Labs, chest x-ray okay.  Blood cultures pending. Does not appear septic Hold off on CT brain since nonfocal unless [facility] feel this is indicated. No apparent surgical problem Ativan 0.5 mg IV when necessary agitation Restrain as necessary for patient safety. Defer  workup and management of encephalopathy to [facility] and nephrology.  ESRD. Dialysis M-W-F Hypertension Tobacco abuse Metabolic bone disease Chronic anemia  @     Mahoganie Basher M 11/28/2015  . .prob

## 2015-11-28 NOTE — Progress Notes (Signed)
Consult note-follow-up    Brief narrative: 75 y/o ? 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 Work-up underway   Past medical history-As per Problem list Chart reviewed as below-   Consultants:  Nephrology  Procedures:  None  Antibiotics:  Doxycycline 11/25/15   Subjective  Fell this morning Order given for IM Ativan however patient's son is refusing Combative at bedside as well Baseline patient able to orient, drive himself to dialysis and takes care of his wife at home No prior dementia Unable to get review of systems   Objective    Interim History:   Telemetry:    Objective: Filed Vitals:   11/27/15 0500 11/27/15 1402 11/27/15 2114 11/28/15 0500  BP: 132/78 166/78 148/82 109/74  Pulse: 88  82 77  Temp: 98.6 F (37 C) 98 F (36.7 C) 98.4 F (36.9 C) 98.5 F (36.9 C)  TempSrc: Oral Oral Oral   Resp: Height:      Weight:      SpO2: 100% 98% 99% 100%    Intake/Output Summary (Last 24 hours) at 11/28/15 0757 Last data filed at 11/27/15 1857  Gross per 24 hour  Intake    180 ml  Output      0 ml  Net    180 ml    Exam:  General: Alert but disoriented and combative Cardiovascular: S1-S2 no murmur rub or gallop Respiratory: Clinically clear no added sound Abdomen: Soft, laparoscopy scars noted abdomen no distention no rebound Skin no lower extremity swelling Neuro intact  Data Reviewed: Basic Metabolic Panel:  Recent Labs Lab 11/25/15 0628 11/25/15 1647 11/26/15 0734 11/27/15 0629 11/28/15 0544  NA 141 141 137 140 143  K 4.2 5.4* 4.7 4.0 4.0  CL  --  100* 103 97* 100*  CO2  --  GLUCOSE 85 96 149* 97 83  BUN  --  24* 32* 18 28*  CREATININE  --  6.61* 7.82* 6.53* 8.56*  CALCIUM  --  9.3 9.8 9.2 9.6  MG  --   --   --  2.0  --   PHOS  --  6.2* 7.2*  --  6.3*   Liver Function Tests:  Recent Labs Lab 11/25/15 1647 11/26/15 0734 11/27/15 0629 11/28/15 0544  AST  --   --  21  --   ALT  --   --  6*  --   ALKPHOS  --   --  50  --   BILITOT  --   --  0.6  --   PROT  --   --  6.3*  --   ALBUMIN 3.3* 3.1* 3.1* 3.5   No results for input(s): LIPASE, AMYLASE in the last 168 hours. No results for input(s): AMMONIA in the last 168 hours. CBC:  Recent Labs Lab 11/25/15 1647 11/26/15 0733 11/26/15 1932 11/27/15 0629 11/28/15 0544  WBC 9.8 6.2 8.0 7.2 8.3  NEUTROABS  --  3.7  --   --   --   HGB 11.1* 10.0* 9.9* 9.5* 10.2*  HCT 34.3* 31.7* 31.7* 30.4* 31.4*  MCV 102.7* 101.0* 100.3* 101.0* 101.9*  PLT 163 151 125* 125* 142*   Cardiac Enzymes:  Recent Labs Lab 11/27/15 0747  TROPONINI 0.07*   BNP: Invalid input(s): POCBNP CBG: No results for input(s): GLUCAP in the last 168 hours.  No results found for this or any previous visit (from the past 240 hour(s)).   Studies:              All Imaging reviewed and is as per above notation   Scheduled Meds: . acetaminophen  1,000 mg Oral TID  . calcium acetate  2,001 mg Oral TID WC  . doxercalciferol  2 mcg Intravenous Q M,W,F-HD  . doxycycline  100 mg Oral BID  . enoxaparin (LOVENOX) injection  30 mg Subcutaneous Q24H  . ferric gluconate (FERRLECIT/NULECIT) IV  62.5 mg Intravenous Q Fri-HD  . finasteride  5 mg Oral Daily  . metoprolol succinate  50 mg Oral QHS  . multivitamin  1 tablet Oral QHS  . sodium chloride flush  3 mL Intravenous Q12H  . tamsulosin  0.4 mg Oral BID   Continuous Infusions:    Assessment/Plan:  1. Recent elective Hernia repair-Rest Per Gen surg 2. Toxic metabolic encephalopathy-DDX Infectious vs Peri-op Anesthesia vs Post-op hypotension-CXR L basilar atelectasis. BC  x2 pending, LFTs normal, WBC normal, no fever curve-obtain ammonia level. "He always gets like this when he is hospitalized"-per son. Discontinue doxycycline as agreed probably no infectious etiology. If no improvement by a.m. agree needs CT head. Suspect metabolic component more so than organic pathology.. If requiring agree with mechanical restraints for the time being. 3. Anemia-stable at current time. Continue Nulecit 62.5 every Friday per nephrology. Recheck iron stores and saturations every 2 weekly? 4. prior smoker half-pack per day 5. Prior history of enterococcal urinary infection/also has history of BPH-unlikelybut possible source for infectious causes. Monitor.   We will continue to follow and hope for improvement in mental status. CT scan head tomorrow if no improvement-per family it sounds like he is starting to recognize pictures of family and is more conversant.   Pleas Koch, MD  [facility] Pager [phone] 11/28/2015, 7:57 AM

## 2015-11-28 NOTE — Progress Notes (Signed)
Pt found on floor beside bed at 0500; bed alarm ringing. RN had left room minutes before patient fell.  Patient stated he was going home to take care of wife.  Small abrasions noted at left eyebrow and right great toe.  Notified Dr. Janee Morn of patient's fall and abrasions.  Order for restraints obtained as patient remains disoriented and will not cooperate with treatment.  Unable to take subsequent frequent vitals as patient would not allow it.  Patient is alert,but confused.

## 2015-11-28 NOTE — Progress Notes (Signed)
[facility] Rounding Note  Subjective:   Pt is agitated and fighting restraints Son says that the loud opinionated behavior is "who he is" (minus the confusion) and that this happens "every time he comes into the hospital" so son sees this behavior as an improvement over the somnolence of 2 days ago Says he is starting to recognize photos of family and friends brought in - could not yesterday I as able to have a conversation of sorts with him Has some parenteral ativan ordered - no IV access - so will give IM  Objective Vital signs in last 24 hours: Filed Vitals:   11/27/15 0500 11/27/15 1402 11/27/15 2114 11/28/15 0500  BP: 132/78 166/78 148/82 109/74  Pulse: 88  82 77  Temp: 98.6 F (37 C) 98 F (36.7 C) 98.4 F (36.9 C) 98.5 F (36.9 C)  TempSrc: Oral Oral Oral   Resp: Height:      Weight:      SpO2: 100% 98% 99% 100%   Weight change:   Intake/Output Summary (Last 24 hours) at 11/28/15 1020 Last data filed at 11/28/15 0853  Gross per 24 hour  Intake    135 ml  Output      0 ml  Net    135 ml    Physical Exam:  BP 109/74 mmHg  Pulse 77  Temp(Src) 98.5 F (36.9 C) (Oral)  Resp 20  Ht 5' 10.5" (1.791 m)  Wt 64.7 kg (142 lb 10.2 oz)  BMI 20.17 kg/m2  SpO2 100% General: Slender elderly AAM  Presently in 4 point restraints and fighting but is consolable and will quiet down to have a conversation Won't wear his nasal cannula  Neck: JVD not elevated. Lungs:Clear  Heart: RRR with S1 S2. No murmurs, rubs, or gallops  Abdomen: Soft, non-distended with normoactive bowel sounds. Lap incision sites intact - small bandages in place  Lower extremities:without edema or ischemic changes, no open wounds  Neuro: He will follow commands and answer questions if spoken to quietly but easily triggered to agitation Dialysis Access:left upper AVF+ bruit with dressing on it.   Labs: Basic Metabolic Panel:  Recent Labs Lab 11/25/15 0628 11/25/15 1647 11/26/15 0734  11/27/15 0629 11/28/15 0544  NA 141 141 137 140 143  K 4.2 5.4* 4.7 4.0 4.0  CL  --  100* 103 97* 100*  CO2  --  GLUCOSE 85 96 149* 97 83  BUN  --  24* 32* 18 28*  CREATININE  --  6.61* 7.82* 6.53* 8.56*  CALCIUM  --  9.3 9.8 9.2 9.6  PHOS  --  6.2* 7.2*  --  6.3*     Recent Labs Lab 11/26/15 0734 11/27/15 0629 11/28/15 0544  AST  --  21  --   ALT  --  6*  --   ALKPHOS  --  50  --   BILITOT  --  0.6  --   PROT  --  6.3*  --   ALBUMIN 3.1* 3.1* 3.5     Recent Labs Lab 11/26/15 0733 11/26/15 1932 11/27/15 0629 11/28/15 0544  WBC 6.2 8.0 7.2 8.3  NEUTROABS 3.7  --   --   --   HGB 10.0* 9.9* 9.5* 10.2*  HCT 31.7* 31.7* 30.4* 31.4*  MCV 101.0* 100.3* 101.0* 101.9*  PLT 151 125* 125* 142*   Medications:   . acetaminophen  1,000 mg Oral TID  . calcium acetate  2,001 mg Oral TID WC  . doxercalciferol  2 mcg Intravenous Q M,W,F-HD  . doxycycline  100 mg Oral BID  . enoxaparin (LOVENOX) injection  30 mg Subcutaneous Q24H  . ferric gluconate (FERRLECIT/NULECIT) IV  62.5 mg Intravenous Q Fri-HD  . finasteride  5 mg Oral Daily  . metoprolol succinate  50 mg Oral QHS  . multivitamin  1 tablet Oral QHS  . sodium chloride flush  3 mL Intravenous Q12H  . tamsulosin  0.4 mg Oral BID    Dialysis Orders: [facility] 4 hr EDW 64 3k 2.25 Ca 400/800 heparin 2000 hectorol 2 venofer 50/Friday Mircera 50 q 6 weeks - last 2/10 last hgb 11 P 6-7 iPTH 237 Ca ok AccessL left upper AVF  Assessment/Plan: 1. s/p bilateral hernia repair and excision of hydrocele- per Dr. Michaell Cowing; rec'd ancef 2/16 and is on doxycycline - not sure why - I have stopped it. 2. ESRD - MWF. Next HD tomorrow.    3. Acute encephalopathy. . Started  after IV benadryl in HD (doubth in retrospect that had anything to do with it) . Infection workup underway. Notably desats if O2 is off. RPR neg. Cultures pending. No s/s infection. More agitated and aggressive today  - BUT son says this is return of his "usual  personality traits" minus the confusion (and he says starting to recognize family photos, familiar items from home) and that this happens "every time he is hospitalized". Still requiring restraints for safety + parenteral ativan->I suspect this is hospital delerium in a pt who may have some underlying occult dementia who has "tipped over the edge" 4. Hypertension/volume - controlled with MTP and volume reduction on HD. No idea why he was on lasix. Stopped that. 5. Anemia - stable hgb low 10's. Last Mircera was 11/19/15 6. Metabolic bone disease - Continue hectorol/binders 7. Nutrition - CL - added multivits - advance to renal diet as able 8. Tobacco abuse - encouraged cessation  Camille Bal, MD [facility] [phone] pager 11/28/2015, 10:20 AM

## 2015-11-28 NOTE — Progress Notes (Signed)
Notified patient's son, Shane [name] that patient had fallen and was now in restraints.  Mr. Ger Ringenberg verbalized understanding of the need for restraints at this time and expressed his gratitude for our care.

## 2015-11-28 NOTE — Progress Notes (Signed)
Ativan ordered by MD for agitation/restless but son declined to have his dad medicated. Restraints use continue

## 2015-11-29 ENCOUNTER — Encounter (HOSPITAL_COMMUNITY): Payer: Self-pay | Admitting: Surgery

## 2015-11-29 LAB — CBC WITH DIFFERENTIAL/PLATELET
BASOS PCT: 0 %
Basophils Absolute: 0 10*3/uL (ref 0.0–0.1)
EOS ABS: 0.5 10*3/uL (ref 0.0–0.7)
EOS PCT: 5 %
HEMATOCRIT: 30.1 % — AB (ref 39.0–52.0)
HEMOGLOBIN: 10 g/dL — AB (ref 13.0–17.0)
LYMPHS ABS: 0.8 10*3/uL (ref 0.7–4.0)
Lymphocytes Relative: 10 %
MCH: 33.3 pg (ref 26.0–34.0)
MCHC: 33.2 g/dL (ref 30.0–36.0)
MCV: 100.3 fL — ABNORMAL HIGH (ref 78.0–100.0)
MONO ABS: 1 10*3/uL (ref 0.1–1.0)
MONOS PCT: 12 %
Neutro Abs: 6.3 10*3/uL (ref 1.7–7.7)
Neutrophils Relative %: 73 %
PLATELETS: 174 10*3/uL (ref 150–400)
RBC: 3 MIL/uL — ABNORMAL LOW (ref 4.22–5.81)
RDW: 13.7 % (ref 11.5–15.5)
WBC: 8.6 10*3/uL (ref 4.0–10.5)

## 2015-11-29 LAB — FOLATE RBC
FOLATE, HEMOLYSATE: 518.7 ng/mL
Folate, RBC: 1801 ng/mL (ref >498)
HEMATOCRIT: 28.8 % — AB (ref 37.5–51.0)

## 2015-11-29 LAB — BASIC METABOLIC PANEL
Anion gap: 17 — ABNORMAL HIGH (ref 5–15)
BUN: 48 mg/dL — AB (ref 6–20)
CALCIUM: 9.4 mg/dL (ref 8.9–10.3)
CHLORIDE: 103 mmol/L (ref 101–111)
CO2: 24 mmol/L (ref 22–32)
CREATININE: 10.81 mg/dL — AB (ref 0.61–1.24)
GFR calc non Af Amer: 4 mL/min — ABNORMAL LOW (ref >60)
GFR, EST AFRICAN AMERICAN: 5 mL/min — AB (ref >60)
GLUCOSE: 83 mg/dL (ref 65–99)
Potassium: 4.8 mmol/L (ref 3.5–5.1)
Sodium: 144 mmol/L (ref 135–145)

## 2015-11-29 LAB — RENAL FUNCTION PANEL
ANION GAP: 21 — AB (ref 5–15)
Albumin: 3.5 g/dL (ref 3.5–5.0)
BUN: 49 mg/dL — ABNORMAL HIGH (ref 6–20)
CHLORIDE: 101 mmol/L (ref 101–111)
CO2: 23 mmol/L (ref 22–32)
Calcium: 9.5 mg/dL (ref 8.9–10.3)
Creatinine, Ser: 10.73 mg/dL — ABNORMAL HIGH (ref 0.61–1.24)
GFR calc non Af Amer: 4 mL/min — ABNORMAL LOW (ref >60)
GFR, EST AFRICAN AMERICAN: 5 mL/min — AB (ref >60)
Glucose, Bld: 84 mg/dL (ref 65–99)
POTASSIUM: 4.4 mmol/L (ref 3.5–5.1)
Phosphorus: 7.8 mg/dL — ABNORMAL HIGH (ref 2.5–4.6)
Sodium: 145 mmol/L (ref 135–145)

## 2015-11-29 MED ORDER — RISPERIDONE 0.5 MG PO TBDP
0.5000 mg | ORAL_TABLET | Freq: Two times a day (BID) | ORAL | Status: DC | PRN
  Administered 2015-11-29: 0.5 mg via ORAL
  Filled 2015-11-29 (×2): qty 1

## 2015-11-29 MED ORDER — TAMSULOSIN HCL 0.4 MG PO CAPS
0.4000 mg | ORAL_CAPSULE | Freq: Every day | ORAL | Status: DC
  Administered 2015-11-30: 0.4 mg via ORAL
  Filled 2015-11-29 (×2): qty 1

## 2015-11-29 MED ORDER — DEXTROSE 5 % IV SOLN
INTRAVENOUS | Status: DC
  Administered 2015-11-29 – 2015-11-30 (×2): via INTRAVENOUS

## 2015-11-29 MED ORDER — NEPRO/CARBSTEADY PO LIQD
237.0000 mL | Freq: Two times a day (BID) | ORAL | Status: DC
Start: 2015-11-29 — End: 2015-12-01
  Administered 2015-11-30 – 2015-12-01 (×3): 237 mL via ORAL
  Filled 2015-11-29 (×8): qty 237

## 2015-11-29 MED ORDER — ACETAMINOPHEN 650 MG RE SUPP: 650.0000 mg | Freq: Four times a day (QID) | RECTAL | Status: DC | PRN

## 2015-11-29 MED ORDER — LORAZEPAM 2 MG/ML IJ SOLN
0.5000 mg | Freq: Every day | INTRAMUSCULAR | Status: DC | PRN
  Administered 2015-11-29 – 2015-11-30 (×2): 0.5 mg via INTRAMUSCULAR
  Filled 2015-11-29: qty 1

## 2015-11-29 MED ORDER — ACETAMINOPHEN 325 MG PO TABS: 325.0000 mg | ORAL_TABLET | Freq: Four times a day (QID) | ORAL | Status: DC | PRN

## 2015-11-29 MED ORDER — LORAZEPAM 2 MG/ML IJ SOLN
1.0000 mg | Freq: Once | INTRAMUSCULAR | Status: DC | PRN
  Filled 2015-11-29: qty 1

## 2015-11-29 MED ORDER — TAMSULOSIN HCL 0.4 MG PO CAPS
0.4000 mg | ORAL_CAPSULE | Freq: Two times a day (BID) | ORAL | Status: DC
Start: 2015-11-29 — End: 2015-11-29

## 2015-11-29 NOTE — Progress Notes (Addendum)
[facility] Rounding Note  Subjective: writhing around in the bed, not speaking, moving all extremities, not responding to commands  Objective Vital signs in last 24 hours: Filed Vitals:   11/29/15 1000 11/29/15 1030 11/29/15 1100 11/29/15 1400  BP: 146/96 128/77 135/80 126/86  Pulse: 65 76 70 72  Temp:  97 F (36.1 C)  98.4 F (36.9 C)  TempSrc:  Axillary  Oral  Resp:  20  20  Height:      Weight:      SpO2:  100%  96%   Weight change:   Intake/Output Summary (Last 24 hours) at 11/29/15 1432 Last data filed at 11/29/15 1100  Gross per 24 hour  Intake      0 ml  Output   1193 ml  Net  -1193 ml    Physical Exam:  BP 126/86 mmHg  Pulse 72  Temp(Src) 98.4 F (36.9 C) (Oral)  Resp 20  Ht 5' 10.5" (1.791 m)  Wt 62 kg (136 lb 11 oz)  BMI 19.33 kg/m2  SpO2 96% General: Slender elderly AAM  Presently in 4 point restraints, writhing in bed, not responding verbally at all Does not follow commands Neck: JVD not elevated. Lungs:Clear  Heart: RRR with S1 S2. No murmurs, rubs, or gallops  Abdomen: Soft, non-distended with normoactive bowel sounds. Lap incision sites intact - small bandages in place. Appears to grimace w palpation of abdomen somewhat GU: condom cath draining clear urine small amounts Lower extremities:without edema or ischemic changes, no open wounds  Neuro: as above, nonfocal, moving ext x 4, not responding verbally and not following any commands Gaze conjugate and pupils equal size Dialysis Access:left upper AVF+ bruit    Labs: Basic Metabolic Panel:  Recent Labs Lab 11/25/15 0628 11/25/15 1647 11/26/15 0734 11/27/15 0629 11/28/15 0544 11/29/15 0633 11/29/15 0820  NA 141 141 137 140 143 144 145  K 4.2 5.4* 4.7 4.0 4.0 4.8 4.4  CL  --  100* 103 97* 100* 103 101  CO2  --  GLUCOSE 85 96 149* 97 83 83 84  BUN  --  24* 32* 18 28* 48* 49*  CREATININE  --  6.61* 7.82* 6.53* 8.56* 10.81* 10.73*  CALCIUM  --  9.3 9.8 9.2 9.6 9.4 9.5   PHOS  --  6.2* 7.2*  --  6.3*  --  7.8*     Recent Labs Lab 11/27/15 0629 11/28/15 0544 11/29/15 0820  AST 21  --   --   ALT 6*  --   --   ALKPHOS 50  --   --   BILITOT 0.6  --   --   PROT 6.3*  --   --   ALBUMIN 3.1* 3.5 3.5     Recent Labs Lab 11/26/15 0733 11/26/15 1932 11/27/15 0629 11/28/15 0544 11/29/15 0820  WBC 6.2 8.0 7.2 8.3 8.6  NEUTROABS 3.7  --   --   --  6.3  HGB 10.0* 9.9* 9.5* 10.2* 10.0*  HCT 31.7* 31.7* 30.4* 31.4* 30.1*  MCV 101.0* 100.3* 101.0* 101.9* 100.3*  PLT 151 125* 125* 142* 174   Medications:   . calcium acetate  2,001 mg Oral TID WC  . doxercalciferol  2 mcg Intravenous Q M,W,F-HD  . enoxaparin (LOVENOX) injection  30 mg Subcutaneous Q24H  . feeding supplement (NEPRO CARB STEADY)  237 mL Oral BID BM  . ferric gluconate (FERRLECIT/NULECIT) IV  62.5 mg Intravenous Q Fri-HD  .  finasteride  5 mg Oral Daily  . metoprolol succinate  50 mg Oral QHS  . multivitamin  1 tablet Oral QHS  . sodium chloride flush  3 mL Intravenous Q12H  . tamsulosin  0.4 mg Oral QPC supper    Dialysis: [facility] 4 hr EDW 64 3k 2.25 Ca 400/800 heparin 2000 hectorol 2 venofer 50/Friday Mircera 50 q 6 weeks - last 2/10 last hgb 11 P 6-7 iPTH 237 Ca ok AccessL left upper AVF  Assessment: 1 SP hernia repair w excision of hydrocele- on 2/16 2 Acute AMS - unclear cause, not improved after 3 days. Reportedly son said this "happens all the time" when he is hospitalized. Other family today say he is never like this. No focal signs on exam.   3 HTN - controlled with MTP, lasix dc'd, doesn't need it.  4 Volume - is 2 kg under dry wtAnemia - stable hgb low 10's. 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 Next HD Wed. Start hypotonic IVF's.   Vinson Moselle MD [employer] pager [phone]    cell  [phone] 11/29/2015, 2:40 PM

## 2015-11-29 NOTE — Progress Notes (Signed)
Pt has been very agitated and combative all day. He did get relief with earlier dose of Ativan .5 mg IM. Unable to start an IV due to the agitation. Notified Dr. Arlean Hopping and Dr. Michaell Cowing. Received order to postpone the CT until delirium resolves.

## 2015-11-29 NOTE — Progress Notes (Signed)
Patient confused in wrist support device and  Posey continues to attempt to climb out of bed and restlessness scratching. Checked for safety and released Q 30 mins for comfort. agitation increased patient started  Yelling and kicking resisting care. Dr Arlean Hopping notified  And rinsed backed report given to floor nurse

## 2015-11-29 NOTE — Progress Notes (Addendum)
[facility]  [address] [place], [place] [address] Phone: [phone] FAX: [place] [phone] [birth year]   Assessment  Problem List:   Principal Problem:   Acute encephalopathy Active Problems:   Tobacco abuse   Hypertension   CKD (chronic kidney disease) stage V requiring chronic dialysis ([place])   Enlarged prostate with lower urinary tract symptoms (LUTS)   Surgery, elective   Bilateral inguinal hernia (BIH) s/p lap repair w mesh 11/25/2015   Hydrocele s/p partial rexction/unroofing 11/25/2015   4 Days Post-Op  11/25/2015  Procedure(s): LAPAROSCOPIC BILATERAL INGUINAL HERNIA REPAIR--EXCISION OF RIGHT HYDROCELE INSERTION OF MESH    GUARDED BUT IMPROVED - dementia w short term delirium resolving.  Plan:  -HD.  No major events.  D/w Nephrology PA - they will see  -follow MS - better.  Defer to [place] nephrology if further w/u needed.  Again nonfocal & improving.  Could have been EtOH w/d -  but no major h/o EtOH.   No hypoxia, no hypo/hyperTN.  No obvious infection.  No severe anemia.  Lytes OK.  Taking no pain meds. ?Encephalopathy?  Consider d/c- ing restraints but defer to medicine  -PT/OT evals ?SNF vs home  -Stable to d/c from surgery standpoint but MS obviously a concern --HTN, ESRD, etc per nephrology   Lasix d/c'd.   -No need for ABx from surgery standpoint - Nephrology stopped home doxycycline  -VTE prophylaxis- SCDs, etc  -mobilize as tolerated to help recovery  I updated the patient's status to the patient.  Recommendations were made.  Questions were answered.  The patient expressed understanding & appreciation.   Adin Hector, M.D., F.A.C.S. Gastrointestinal and Minimally Invasive Surgery [facility] [address], [place] [address] [place], [place] [address] [phone] Main / Paging   11/29/2015  Subjective:  In HD - evaluated him there.  No  family in room nor in HD Smiling Denies pain Denies n/v [place] Knows he had hernia surgery at [facility] by me.  "I'm fine!" Doesn't remember falling out of bed 2 days ago BUE restraints in place   Objective:  Vital signs:  Filed Vitals:   11/28/15 0500 11/28/15 1431 11/28/15 2109 11/29/15 0630  BP: 109/74 128/71 120/74 167/64  Pulse: 77  75 80  Temp: 98.5 F (36.9 C)  98.3 F (36.8 C) 98.5 F (36.9 C)  TempSrc:   Oral Oral  Resp: 20 20 19 18   Height:      Weight:      SpO2: 100%  100% 98%    Last BM Date:  (PTA)  Intake/Output   Yesterday:  02/19 0701 - 02/20 0700 In: 75 [P.O.:75] Out: 0  This shift:     Bowel function:  Flatus: y  BM: n  Drain: n/a  Physical Exam:  General: Pt awake/alert/oriented x4 in no acute distress.  Thin but similar.  Smiling.  Drifts to sleep a little but wakes right up. Eyes: PERRL, normal EOM.  Sclera clear.  No icterus Neuro: CN II-XII intact w/o focal sensory/motor deficits. Lymph: No head/neck/groin lymphadenopathy Psych:  No psychosis/paranoia.  Not agitated, pleasant HENT: Normocephalic, Mucus membranes dry with some caking.  No thrush Neck: Supple, No tracheal deviation Chest: No chest wall pain w good excursion CV:  Pulses intact.  Regular rhythm MS: Normal AROM mjr joints.  No obvious deformity Abdomen: Soft.  Nondistended.  Nontender at incisions - normal ridges.  No seroma/hematoma.  Mildly  tender at incisions only.  No evidence of peritonitis.  No incarcerated hernias. GU:  NO hernias.  No seroma/hematoma.  Scroutum smaller s/p hydrocele removal/unroofing.  Condom cath in place with scant urine in bag Ext:  SCDs BLE.  No mjr edema.  No cyanosis Skin: No petechiae / purpura  Results:   Labs: Results for orders placed or performed during the hospital encounter of 11/25/15 (from the past 48 hour(s))  Vitamin B12     Status: None   Collection Time: 11/27/15  7:47 AM  Result Value Ref Range   Vitamin B-12 519 180 - 914  pg/mL    Comment: (NOTE) This assay is not validated for testing neonatal or myeloproliferative syndrome specimens for Vitamin B12 levels.   RPR     Status: None   Collection Time: 11/27/15  7:47 AM  Result Value Ref Range   RPR Ser Ql Non Reactive Non Reactive    Comment: (NOTE) Performed At: [facility] [address] [place], [place] [id number] Lindon Romp MD AG:[id number]   Troponin I     Status: Abnormal   Collection Time: 11/27/15  7:47 AM  Result Value Ref Range   Troponin I 0.07 (H) <0.031 ng/mL    Comment:        PERSISTENTLY INCREASED TROPONIN VALUES IN THE RANGE OF 0.04-0.49 ng/mL CAN BE SEEN IN:       -UNSTABLE ANGINA       -CONGESTIVE HEART FAILURE       -MYOCARDITIS       -CHEST TRAUMA       -ARRYHTHMIAS       -LATE PRESENTING MYOCARDIAL INFARCTION       -COPD   CLINICAL FOLLOW-UP RECOMMENDED.   Renal function panel     Status: Abnormal   Collection Time: 11/28/15  5:44 AM  Result Value Ref Range   Sodium 143 135 - 145 mmol/L   Potassium 4.0 3.5 - 5.1 mmol/L   Chloride 100 (L) 101 - 111 mmol/L   CO2 27 22 - 32 mmol/L   Glucose, Bld 83 65 - 99 mg/dL   BUN 28 (H) 6 - 20 mg/dL   Creatinine, Ser 8.56 (H) 0.61 - 1.24 mg/dL   Calcium 9.6 8.9 - 10.3 mg/dL   Phosphorus 6.3 (H) 2.5 - 4.6 mg/dL   Albumin 3.5 3.5 - 5.0 g/dL   GFR calc non Af Amer 5 (L) >60 mL/min   GFR calc Af Amer 6 (L) >60 mL/min    Comment: (NOTE) The eGFR has been calculated using the CKD EPI equation. This calculation has not been validated in all clinical situations. eGFR's persistently <60 mL/min signify possible Chronic Kidney Disease.    Anion gap 16 (H) 5 - 15  CBC     Status: Abnormal   Collection Time: 11/28/15  5:44 AM  Result Value Ref Range   WBC 8.3 4.0 - 10.5 K/uL   RBC 3.08 (L) 4.22 - 5.81 MIL/uL   Hemoglobin 10.2 (L) 13.0 - 17.0 g/dL   HCT 31.4 (L) 39.0 - 52.0 %   MCV 101.9 (H) 78.0 - 100.0 fL   MCH 33.1 26.0 - 34.0 pg   MCHC 32.5 30.0 - 36.0 g/dL    RDW 13.8 11.5 - 15.5 %   Platelets 142 (L) 150 - 400 K/uL    Imaging / Studies: No results found.  Medications / Allergies: per chart  Antibiotics: Anti-infectives    Start     Dose/Rate Route Frequency Ordered  Stop   11/25/15 2200  doxycycline (VIBRA-TABS) tablet 100 mg  Status:  Discontinued     100 mg Oral 2 times daily 11/25/15 1637 11/28/15 1230   11/25/15 1645  ceFAZolin (ANCEF) IVPB 2 g/50 mL premix  Status:  Discontinued     2 g 100 mL/hr over 30 Minutes Intravenous 3 times per day 11/25/15 1637 11/25/15 1647   11/25/15 0700  ceFAZolin (ANCEF) IVPB 2 g/50 mL premix     2 g 100 mL/hr over 30 Minutes Intravenous To [facility] 11/24/15 1233 11/25/15 0810        Note: Portions of this report may have been transcribed using voice recognition software. Every effort was made to ensure accuracy; however, inadvertent computerized transcription errors may be present.   Any transcriptional errors that result from this process are unintentional.     Adin Hector, M.D., F.A.C.S. Gastrointestinal and Minimally Invasive Surgery [facility] [address] [place], [place] [address] [phone] Main / Paging   11/29/2015  CARE TEAM:  PCP: Leonides Sake, MD  Outpatient Care Team: Patient Care Team: Leonides Sake, MD as PCP - General Donato Heinz, MD as Consulting Physician (Nephrology) Conrad [place], MD as Consulting Physician (Vascular Surgery) Michael Boston, MD as Consulting Physician (General Surgery)  Inpatient Treatment Team: Treatment Team: Attending Provider: Michael Boston, MD; Consulting Physician: Edrick Oh, MD; Consulting Physician: Jamal Maes, MD; Registered Nurse: Kennith Center, RN; Registered Nurse: Micki Riley, RN; Technician: Francene Finders, NT; Registered Nurse: [name], RN; Technician: Nino Glow, NT; Consulting Physician: Minerva Ends, MD; Technician: Paula Compton, NT;  Registered Nurse: Shirleen Schirmer, RN; Registered Nurse: Rosalva Ferron, RN

## 2015-11-29 NOTE — Progress Notes (Signed)
Consult note-follow-up    Brief narrative: 75 y/o ? ESRD  2/2 to Amyloidosis foll Dr Clelia Croft in the past MWF [facility] since 07/2015 Dr. 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   Past medical history-As per Problem list Chart reviewed as below-   Consultants:  Nephrology  Procedures:  None  Antibiotics:  Doxycycline 11/25/15   Subjective    combative and agitated but more oriented than previously. More verbal and telling me that he is in a garage, this is [place] and can oriented little better.   no nausea no vomiting  not allowing nurses to give him care  no focal deficit and patient is very strong despite restraints is pulling at them tightly. Inappropriately shouting and delusional and thinks that people are outside the door with guns    Objective    Interim History:   Telemetry:    Objective: Filed Vitals:   11/29/15 0929 11/29/15 1000 11/29/15 1030 11/29/15 1100  BP: 147/91 146/96 128/77 135/80  Pulse: 81 65 76 70  Temp:   97 F (36.1 C)   TempSrc:   Axillary   Resp: 18  20   Height:      Weight:      SpO2:   100%     Intake/Output Summary (Last 24 hours) at 11/29/15 1142 Last data filed at 11/29/15 1100  Gross per 24 hour  Intake     60 ml  Output   1193 ml  Net  -1133 ml    Exam:  General: Alert but disoriented combative Cardiovascular: S1-S2 no murmur rub or gallop Respiratory: Clinically clear no added sound Abdomen: Soft, laparoscopy scars noted abdomen no distention no rebound   Data Reviewed: Basic Metabolic Panel:  Recent Labs Lab 11/25/15 1647 11/26/15 0734 11/27/15 0629 11/28/15 0544 11/29/15 0633 11/29/15 0820  NA 141 137 140 143 144 145  K 5.4* 4.7 4.0  4.0 4.8 4.4  CL 100* 103 97* 100* 103 101  CO2 GLUCOSE 96 149* 97 83 83 84  BUN 24* 32* 18 28* 48* 49*  CREATININE 6.61* 7.82* 6.53* 8.56* 10.81* 10.73*  CALCIUM 9.3 9.8 9.2 9.6 9.4 9.5  MG  --   --  2.0  --   --   --   PHOS 6.2* 7.2*  --  6.3*  --  7.8*   Liver Function Tests:  Recent Labs Lab 11/25/15 1647 11/26/15 0734 11/27/15 0629 11/28/15 0544 11/29/15 0820  AST  --   --  21  --   --   ALT  --   --  6*  --   --   ALKPHOS  --   --  50  --   --   BILITOT  --   --  0.6  --   --   PROT  --   --  6.3*  --   --   ALBUMIN 3.3* 3.1* 3.1* 3.5 3.5   No results for input(s): LIPASE, AMYLASE in the last 168 hours. No results for input(s): AMMONIA in the last 168 hours. CBC:  Recent Labs Lab 11/26/15 0733 11/26/15 1932 11/27/15 0629 11/28/15 0544 11/29/15 0820  WBC 6.2 8.0 7.2 8.3 8.6  NEUTROABS 3.7  --   --   --  6.3  HGB 10.0* 9.9* 9.5* 10.2* 10.0*  HCT 31.7* 31.7* 30.4* 31.4* 30.1*  MCV 101.0* 100.3* 101.0* 101.9* 100.3*  PLT 151 125* 125* 142* 174   Cardiac Enzymes:  Recent Labs Lab 11/27/15 0747  TROPONINI 0.07*   BNP: Invalid input(s): POCBNP CBG: No results for input(s): GLUCAP in the last 168 hours.  Recent Results (from the past 240 hour(s))  Culture, blood (Routine X 2) w Reflex to ID Panel     Status: None (Preliminary result)   Collection Time: 11/27/15  7:00 AM  Result Value Ref Range Status   Specimen Description BLOOD RIGHT ANTECUBITAL  Final   Special Requests BOTTLES DRAWN AEROBIC AND ANAEROBIC 10CCS  Final   Culture NO GROWTH 1 DAY  Final   Report Status PENDING  Incomplete  Culture, blood (Routine X 2) w Reflex to ID Panel     Status: None (Preliminary result)   Collection Time: 11/27/15  7:15 AM  Result Value Ref Range Status   Specimen Description BLOOD RIGHT HAND  Final   Special Requests BOTTLES DRAWN AEROBIC AND ANAEROBIC 10CCS  Final   Culture NO GROWTH 1 DAY  Final   Report Status PENDING  Incomplete      Studies:              All Imaging reviewed and is as per above notation   Scheduled Meds: . calcium acetate  2,001 mg Oral TID WC  . doxercalciferol  2 mcg Intravenous Q M,W,F-HD  . enoxaparin (LOVENOX) injection  30 mg Subcutaneous Q24H  . feeding supplement (NEPRO CARB STEADY)  237 mL Oral BID BM  . ferric gluconate (FERRLECIT/NULECIT) IV  62.5 mg Intravenous Q Fri-HD  . finasteride  5 mg Oral Daily  . metoprolol succinate  50 mg Oral QHS  . multivitamin  1 tablet Oral QHS  . sodium chloride flush  3 mL Intravenous Q12H  . tamsulosin  0.4 mg Oral BID   Continuous Infusions:    Assessment/Plan:  1. Recent elective Hernia repair-Rest Per Gen surg 2. Toxic metabolic encephalopathy-DDX Infectious vs Peri-op Anesthesia vs Post-op hypotension-CXR L basilar atelectasis. BC x2 pending, LFTs normal, WBC normal, no fever curve-obtain ammonia level. "He always gets like this when he is hospitalized"-per son.  Continue Ativan Im 0.5 5 times daily No focal deficit-doubt will need CT head. Suspect metabolic component more so than organic pathology. If requiring agree with mechanical restraints for the time being.  curbsided psychiatry-Will give Risperdal M 0.5 disintegrating tablets bid and check EKG in am 3. Anemia-stable at current time. Continue Nulecit 62.5 every Friday per nephrology. Recheck iron stores and saturations every 2 weekly? 4. Htn-continue Metoprolol XL 50 daily.  Amlodipine on hold 5. BPH-continue Finasteride 5 daily and FLomax 0.4.  Changed dose to daily, not BID 6. ESRD MWF-signed off early 2/20 after 3 hours 7. COPD prior smoker half-pack per day-stable.  No wheeze 8. Prior history of enterococcal urinary infection/also has history of BPH-unlikelybut possible source for infectious causes. Monitor.   We will continue to follow and hope for improvement in mental status. Much more alert-hope that delirium clear.   We can assume care as attending if things persist beyond the  next 24 hrs-let me know    Pleas Koch, MD  [facility] Pager [phone] 11/29/2015, 11:42 AM

## 2015-11-30 ENCOUNTER — Ambulatory Visit (HOSPITAL_COMMUNITY): Payer: Medicare Other

## 2015-11-30 DIAGNOSIS — R410 Disorientation, unspecified: Secondary | ICD-10-CM | POA: Diagnosis not present

## 2015-11-30 MED ORDER — RISPERIDONE 0.5 MG PO TBDP
0.5000 mg | ORAL_TABLET | Freq: Every day | ORAL | Status: DC
Start: 2015-12-01 — End: 2015-11-30

## 2015-11-30 MED ORDER — BISACODYL 10 MG RE SUPP
10.0000 mg | Freq: Every day | RECTAL | Status: DC
  Filled 2015-11-30 (×2): qty 1

## 2015-11-30 NOTE — Progress Notes (Signed)
Subjective:  Had restraints on / somnolent  in bed  Given Ativan  Earlier / note CT not done yest sec to agitation   Objective Vital signs in last 24 hours: Filed Vitals:   11/29/15 1030 11/29/15 1100 11/29/15 1400 11/30/15 0328  BP: 128/77 135/80 126/86 149/91  Pulse: 76 70 72 62  Temp: 97 F (36.1 C)  98.4 F (36.9 C) 98.4 F (36.9 C)  TempSrc: Axillary  Oral Axillary  Resp: Height:      Weight:      SpO2: 100%  96% 97%   Weight change:   Physical Exam: General: thin elderly AAM, in restraints in BED /somnolent / no verbally response   Heart: RRR no m, r, g  Lungs:  slightly decr resp effort  nonlabored breathing / grossly CTA  Abdomen: soft , NT, ND/ bs pos. Extremities: no pedal edema   Dialysis Access: pod bruit LUA AVF    OP Dialysis: [facility] 4 hr EDW 64 3k 2.25 Ca 400/800 heparin 2000 hectorol 2 venofer 50/Friday Mircera 50 q 6 weeks - last 2/10 last hgb 11 P 6-7 iPTH 237 Ca ok AccessL left upper AVF  Problem/Plan: 1 SP hernia repair w excision of hydrocele- on 2/16 2 Acute AMS - unclear cause, not improved after 4 days. CT HD AND ABD yest not done sec agitation / now sedated after Ativan/ respiradol  Yest. Son says he has done this before during hospital admission. Unable to get head CT due to agitation yesterday. No medications implicated in AMS. No fevers/^wbc, electrolytes are normal.  Unclear cause, possible sundowning / underlying dementia.  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 7 Nutrition - alb  3.5  And stable last 2 days 8 Tobacco abuse  Shane Pastel, PA-C [facility] Beeper [phone] 11/30/2015,10:50 AM   Pt seen, examined, agree w assess/plan as above with additions as indicated.  Shane Moselle MD [facility] pager [phone]    cell [phone] 11/30/2015, 1:03 PM     Labs: Basic Metabolic Panel:  Recent Labs Lab 11/26/15 0734  11/28/15 0544 11/29/15 0633 11/29/15 0820  NA 137  < > 143 144 145  K 4.7  < > 4.0 4.8 4.4  CL 103  < > 100* 103 101  CO2 22  < > GLUCOSE 149*  < > 83 83 84  BUN 32*  < > 28* 48* 49*  CREATININE 7.82*  < > 8.56* 10.81* 10.73*  CALCIUM 9.8  < > 9.6 9.4 9.5  PHOS 7.2*  --  6.3*  --  7.8*  < > = values in this interval not displayed. Liver Function Tests:  Recent Labs Lab 11/27/15 0629 11/28/15 0544 11/29/15 0820  AST 21  --   --   ALT 6*  --   --   ALKPHOS 50  --   --   BILITOT 0.6  --   --   PROT 6.3*  --   --   ALBUMIN 3.1* 3.5 3.5   No results for input(s): LIPASE, AMYLASE in the last 168 hours. No results for input(s): AMMONIA in the last 168 hours. CBC:  Recent Labs Lab 11/26/15 0733 11/26/15 1932 11/27/15 0629 11/27/15 0747 11/28/15 0544 11/29/15 0820  WBC 6.2 8.0 7.2  --  8.3 8.6  NEUTROABS 3.7  --   --   --   --  6.3  HGB 10.0* 9.9* 9.5*  --  10.2* 10.0*  HCT 31.7* 31.7* 30.4* 28.8* 31.4* 30.1*  MCV 101.0* 100.3* 101.0*  --  101.9* 100.3*  PLT 151 125* 125*  --  142* 174   Cardiac Enzymes:  Recent Labs Lab 11/27/15 0747  TROPONINI 0.07*   CBG: No results for input(s): GLUCAP in the last 168 hours.  Studies/Results: No results found. Medications: . dextrose 75 mL/hr at 11/30/15 0610   . bisacodyl  10 mg Rectal Daily  . calcium acetate  2,001 mg Oral TID WC  . doxercalciferol  2 mcg Intravenous Q M,W,F-HD  . enoxaparin (LOVENOX) injection  30 mg Subcutaneous Q24H  . feeding supplement (NEPRO CARB STEADY)  237 mL Oral BID BM  . ferric gluconate (FERRLECIT/NULECIT) IV  62.5 mg Intravenous Q Fri-HD  . finasteride  5 mg Oral Daily  . metoprolol succinate  50 mg Oral QHS  . multivitamin  1 tablet Oral QHS  . sodium chloride flush  3 mL Intravenous Q12H  . tamsulosin  0.4 mg Oral QPC supper

## 2015-11-30 NOTE — Progress Notes (Signed)
[facility]  [address] [place], [place] [address] Phone: [phone] FAX: [place] [phone] [birth year]   Assessment  Problem List:   Principal Problem:   Acute encephalopathy Active Problems:   Tobacco abuse   Hypertension   CKD (chronic kidney disease) stage V requiring chronic dialysis (HCC)   Amyloidosis (HCC)   Essential hypertension   Enlarged prostate with lower urinary tract symptoms (LUTS)   Bilateral inguinal hernia (BIH) s/p lap repair w mesh 11/25/2015   Hydrocele s/p partial rexction/unroofing 11/25/2015   5 Days Post-Op  11/25/2015  Procedure(s): LAPAROSCOPIC BILATERAL INGUINAL HERNIA REPAIR--EXCISION OF RIGHT HYDROCELE INSERTION OF MESH    GUARDED  - dementia w short term delirium  Plan:  -follow MS.  Confusion/delerium in hospital common per family but seems longer than usual.  Defer to [place] [facility] if further w/u needed.  Again nonfocal.  No hypercarbia on ABG a few days ago ?repeat?  CT pending.  Could have been EtOH w/d -  but no major h/o EtOH per family.   No hypoxia, no hypo/hyperTN.  No obvious infection.  No severe anemia.  Lytes OK.  Taking no pain meds. No abd pain/peritinitis.  Worse after HD ? meds?  ?Encephalopathy?    -Not adding much from surgery standpoint - no evidence of peritonitis or obstruction.  -PT/OT evals ?SNF vs home  -no pain meds >24hours  -Retry PO  -HTN, ESRD, etc per nephrology      -No need for ABx from surgery standpoint - Nephrology stopped home doxycycline.  BC x 2 - x48hrs.  Doubt CT will show anything & expect some SQ/IP gas from surgery.  No peritonitis.  Doubt UTI  -VTE prophylaxis- SCDs, etc  -mobilize as tolerated to help recovery  I updated the patient's status to the nurse.  Recommendations were made.  Questions were answered.  The patient expressed understanding & appreciation.   Adin Hector, M.D.,  F.A.C.S. Gastrointestinal and Minimally Invasive Surgery [facility] [address], [place], [place] [address] [phone] Main / Paging   11/30/2015  Subjective:  Agitated after HD - needed inc sedation - Ativan x1 only last night.  Respiridol Sleeping this AM   Objective:  Vital signs:  Filed Vitals:   11/29/15 1030 11/29/15 1100 11/29/15 1400 11/30/15 0328  BP: 128/77 135/80 126/86 149/91  Pulse: 76 70 72 62  Temp: 97 F (36.1 C)  98.4 F (36.9 C) 98.4 F (36.9 C)  TempSrc: Axillary  Oral Axillary  Resp: 20  20 16   Height:      Weight:      SpO2: 100%  96% 97%    Last BM Date: 11/27/15  Intake/Output   Yesterday:  02/20 0701 - 02/21 0700 In: 736.3 [I.V.:736.3] Out: 1193  This shift:     Bowel function:  Flatus: y  BM: n  Drain: n/a  Physical Exam:  General: Pt resting - Moving to voice/touch but not talking this AM.  Thin.   Eyes: PERRL, normal EOM.  Sclera clear.  No icterus Neuro: CN II-XII intact w/o focal sensory/motor deficits. Lymph: No head/neck/groin lymphadenopathy Psych:  No psychosis/paranoia.  Not agitated, pleasant HENT: Normocephalic, Mucus membranes dry with some caking.  No thrush Neck: Supple, No tracheal deviation Chest: No chest wall pain w good excursion CV:  Pulses intact.  Regular rhythm MS: Normal AROM mjr joints.  No obvious deformity Abdomen: Soft.  Nondistended.  Incisions - normal ridges.  No seroma/hematoma.  No evidence of peritonitis.  No incarcerated hernias. GU:  NO hernias.  No seroma/hematoma.  Scrotum smaller s/p hydrocele removal/unroofing.  Ext:  SCDs BLE.  No mjr edema.  No cyanosis Skin: No petechiae / purpura  Results:   Labs: Results for orders placed or performed during the hospital encounter of 11/25/15 (from the past 48 hour(s))  Basic metabolic panel     Status: Abnormal   Collection Time: 11/29/15  6:33 AM  Result Value Ref Range   Sodium 144 135 - 145 mmol/L    Potassium 4.8 3.5 - 5.1 mmol/L   Chloride 103 101 - 111 mmol/L   CO2 24 22 - 32 mmol/L   Glucose, Bld 83 65 - 99 mg/dL   BUN 48 (H) 6 - 20 mg/dL   Creatinine, Ser 10.81 (H) 0.61 - 1.24 mg/dL   Calcium 9.4 8.9 - 10.3 mg/dL   GFR calc non Af Amer 4 (L) >60 mL/min   GFR calc Af Amer 5 (L) >60 mL/min    Comment: (NOTE) The eGFR has been calculated using the CKD EPI equation. This calculation has not been validated in all clinical situations. eGFR's persistently <60 mL/min signify possible Chronic Kidney Disease.    Anion gap 17 (H) 5 - 15  CBC with Differential/Platelet     Status: Abnormal   Collection Time: 11/29/15  8:20 AM  Result Value Ref Range   WBC 8.6 4.0 - 10.5 K/uL   RBC 3.00 (L) 4.22 - 5.81 MIL/uL   Hemoglobin 10.0 (L) 13.0 - 17.0 g/dL   HCT 30.1 (L) 39.0 - 52.0 %   MCV 100.3 (H) 78.0 - 100.0 fL   MCH 33.3 26.0 - 34.0 pg   MCHC 33.2 30.0 - 36.0 g/dL   RDW 13.7 11.5 - 15.5 %   Platelets 174 150 - 400 K/uL   Neutrophils Relative % 73 %   Neutro Abs 6.3 1.7 - 7.7 K/uL   Lymphocytes Relative 10 %   Lymphs Abs 0.8 0.7 - 4.0 K/uL   Monocytes Relative 12 %   Monocytes Absolute 1.0 0.1 - 1.0 K/uL   Eosinophils Relative 5 %   Eosinophils Absolute 0.5 0.0 - 0.7 K/uL   Basophils Relative 0 %   Basophils Absolute 0.0 0.0 - 0.1 K/uL  Renal function panel     Status: Abnormal   Collection Time: 11/29/15  8:20 AM  Result Value Ref Range   Sodium 145 135 - 145 mmol/L   Potassium 4.4 3.5 - 5.1 mmol/L   Chloride 101 101 - 111 mmol/L   CO2 23 22 - 32 mmol/L   Glucose, Bld 84 65 - 99 mg/dL   BUN 49 (H) 6 - 20 mg/dL   Creatinine, Ser 10.73 (H) 0.61 - 1.24 mg/dL   Calcium 9.5 8.9 - 10.3 mg/dL   Phosphorus 7.8 (H) 2.5 - 4.6 mg/dL   Albumin 3.5 3.5 - 5.0 g/dL   GFR calc non Af Amer 4 (L) >60 mL/min   GFR calc Af Amer 5 (L) >60 mL/min    Comment: (NOTE) The eGFR has been calculated using the CKD EPI equation. This calculation has not been validated in all clinical  situations. eGFR's persistently <60 mL/min signify possible Chronic Kidney Disease.    Anion gap 21 (H) 5 - 15    Imaging / Studies: No results found.  Medications / Allergies: per chart  Antibiotics: Anti-infectives    Start  Dose/Rate Route Frequency Ordered Stop   11/25/15 2200  doxycycline (VIBRA-TABS) tablet 100 mg  Status:  Discontinued     100 mg Oral 2 times daily 11/25/15 1637 11/28/15 1230   11/25/15 1645  ceFAZolin (ANCEF) IVPB 2 g/50 mL premix  Status:  Discontinued     2 g 100 mL/hr over 30 Minutes Intravenous 3 times per day 11/25/15 1637 11/25/15 1647   11/25/15 0700  ceFAZolin (ANCEF) IVPB 2 g/50 mL premix     2 g 100 mL/hr over 30 Minutes Intravenous To [facility] 11/24/15 1233 11/25/15 0810        Note: Portions of this report may have been transcribed using voice recognition software. Every effort was made to ensure accuracy; however, inadvertent computerized transcription errors may be present.   Any transcriptional errors that result from this process are unintentional.     Adin Hector, M.D., F.A.C.S. Gastrointestinal and Minimally Invasive Surgery [facility] [address] [place], [place] [address] [phone] Main / Paging   11/30/2015  CARE TEAM:  PCP: Leonides Sake, MD  Outpatient Care Team: Patient Care Team: Leonides Sake, MD as PCP - General Donato Heinz, MD as Consulting Physician (Nephrology) Conrad [place], MD as Consulting Physician (Vascular Surgery) Michael Boston, MD as Consulting Physician (General Surgery)  Inpatient Treatment Team: Treatment Team: Attending Provider: Michael Boston, MD; Consulting Physician: Edrick Oh, MD; Consulting Physician: Jamal Maes, MD; Registered Nurse: Kennith Center, RN; Registered Nurse: Micki Riley, RN; Technician: Francene Finders, NT; Registered Nurse: [name], RN; Consulting Physician: Minerva Ends, MD; Registered Nurse:  Shirleen Schirmer, RN; Registered Nurse: Vidal Schwalbe, RN; Technician: Nino Glow, NT; Physical Therapist: Harvie Heck, PT; Occupational Therapist: Walker Kehr, OT

## 2015-11-30 NOTE — Progress Notes (Signed)
[name] is now Alert & Oriented, cracking jokes, smiling, holding normal conversations, and currently singing an R-Kelly song.  He is out of restraints and in no need of a sitter.  Dr. Mahala Menghini notified and aware of [name] change in mentation.

## 2015-11-30 NOTE — Evaluation (Signed)
Physical Therapy Evaluation Patient Details Name: Shane [name] MRN: [id number] DOB: 1941-04-19 Today's Date: 11/30/2015   History of Present Illness  75 y.o. male admitted to [facility] on 11/24/14 laparascopic bilateral inguinal hernia repair on 11/25/15.  Post op course complicated by AMS with unclear cause (medical workup in progress).  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    Clinical Impression  Pt was able to get up and walk to the bathroom, but is very unsteady on his feet and impulsive.  The RW is more of an obstacle than an aid.  He is at high risk of falls right now.  His son is in the room at the end of the session trying to reorient him to the events of the past few days.  Son was brought up in a transport chair, so it doesn't look like he could provide physical assist to his father.  At this time, pt is most appropriate for SNF placement for rehab.  He may progress quickly, though as his cognition improves. PT plans to bring a cane next session as this is his normal assistive device.      Follow Up Recommendations SNF    Equipment Recommendations  None recommended by PT    Recommendations for Other Services   NA    Precautions / Restrictions Precautions Precautions: Fall Precaution Comments: pt is very unsteady on his feet and quite impulsive, sits prematurely      Mobility  Bed Mobility Overal bed mobility: Modified Independent                Transfers Overall transfer level: Needs assistance Equipment used: Rolling walker (2 wheeled) Transfers: Sit to/from Stand Sit to Stand: Mod assist;Min assist         General transfer comment: up to mod assist to get to standing from lower toilet in bathroom.  Pt pulling on RW with both hands despite cues to push up from support surface, also sat prematurely and with uncontrolled descent to both toilet and to recliner chair.   Ambulation/Gait Ambulation/Gait assistance: Min  assist;Mod assist Ambulation Distance (Feet): 20 Feet Assistive device: Rolling walker (2 wheeled) Gait Pattern/deviations: Step-through pattern;Staggering left;Staggering right     General Gait Details: Up to mod assist for LOB during gait.  Pt has never used a RW before (uses cane at baseline) and is very impulsive and unsafe with using it.  Right now it is almost acting as an obstacle instead of a walking aid.           Balance Overall balance assessment: Needs assistance Sitting-balance support: Feet supported;Bilateral upper extremity supported Sitting balance-Leahy Scale: Fair     Standing balance support: Bilateral upper extremity supported Standing balance-Leahy Scale: Poor                               Pertinent Vitals/Pain Pain Assessment: No/denies pain    Home Living Family/patient expects to be discharged to:: Private residence Living Arrangements: Spouse/significant other;Children Available Help at Discharge: Family;Available PRN/intermittently Type of Home: House Home Access: Ramped entrance     Home Layout: One level Home Equipment: Cane - single point Additional Comments: Pt says that he uses the [facility] at home.    Prior Function Level of Independence: Independent with assistive device(s)         Comments: Home set-up and prior function provided by pt though question accuracy due  to cognitive impairments.     Hand Dominance   Dominant Hand: Right    Extremity/Trunk Assessment   Upper Extremity Assessment: Defer to OT evaluation           Lower Extremity Assessment: Generalized weakness      Cervical / Trunk Assessment: Kyphotic  Communication   Communication: No difficulties  Cognition Arousal/Alertness: Awake/alert Behavior During Therapy:  (paranoid of staff) Overall Cognitive Status: Impaired/Different from baseline Area of Impairment: Orientation;Attention;Memory;Following commands;Safety/judgement;Awareness;Problem  solving Orientation Level: Situation Current Attention Level: Sustained Memory: Decreased short-term memory Following Commands: Follows one step commands consistently Safety/Judgement: Decreased awareness of safety;Decreased awareness of deficits Awareness: Intellectual Problem Solving: Difficulty sequencing;Requires verbal cues;Requires tactile cues General Comments: Pt is impulsive, recognizes son, but thinks he went home after surgery (he did not-he stayed here in the hospital).  Son trying to reorient him to events.              Assessment/Plan    PT Assessment Patient needs continued PT services  PT Diagnosis Difficulty walking;Abnormality of gait;Generalized weakness;Acute pain;Altered mental status   PT Problem List Decreased strength;Decreased activity tolerance;Decreased balance;Decreased mobility;Decreased cognition;Decreased knowledge of use of DME  PT Treatment Interventions DME instruction;Gait training;Stair training;Functional mobility training;Therapeutic activities;Balance training;Therapeutic exercise;Cognitive remediation;Patient/family education   PT Goals (Current goals can be found in the Care Plan section) Acute Rehab PT Goals Patient Stated Goal: to get out of here and go home PT Goal Formulation: With patient Time For Goal Achievement: 12/14/15 Potential to Achieve Goals: Good    Frequency Min 3X/week    End of Session Equipment Utilized During Treatment: Gait belt Activity Tolerance: Patient tolerated treatment well Patient left: in chair;with call bell/phone within reach;with chair alarm set;with restraints reapplied;with family/visitor present Nurse Communication: Mobility status    Functional Assessment Tool Used: assist level Functional Limitation: Mobility: Walking and moving around Mobility: Walking and Moving Around Current Status [phone]): At least 20 percent but less than 40 percent impaired, limited or restricted Mobility: Walking and Moving  Around Goal Status [phone]): At least 1 percent but less than 20 percent impaired, limited or restricted    Time: 1501-1534 PT Time Calculation (min) (ACUTE ONLY): 33 min   Charges:   PT Evaluation $PT Eval Moderate Complexity: 1 Procedure PT Treatments $Therapeutic Activity: 8-22 mins   PT G Codes:   PT G-Codes **NOT FOR INPATIENT CLASS** Functional Assessment Tool Used: assist level Functional Limitation: Mobility: Walking and moving around Mobility: Walking and Moving Around Current Status ([id number]): At least 20 percent but less than 40 percent impaired, limited or restricted Mobility: Walking and Moving Around Goal Status [phone]): At least 1 percent but less than 20 percent impaired, limited or restricted    Diontre Harps B. Sharada Albornoz, PT, DPT [phone]   11/30/2015, 3:51 PM

## 2015-11-30 NOTE — Progress Notes (Signed)
OT Cancellation Note  Patient Details Name: Shane [name] MRN: [id number] DOB: October 19, 1940   Cancelled Treatment:    Reason Eval/Treat Not Completed: Fatigue/lethargy limiting ability to participate - unable to arouse despite max stimuli.  Will reattempt.   Angelene Giovanni Olivia, OTR/L [phone]' 11/30/2015, 12:24 PM

## 2015-11-30 NOTE — Progress Notes (Signed)
Brief narrative: 75 y/o ? 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   Past medical history-As per Problem list Chart reviewed as below-   Consultants:  Nephrology  Procedures:  None  Antibiotics:  Doxycycline 11/25/15   Subjective   Again agitated this am Now calmer.  Drinking some Nepro Hasn't needed to be medicated per RN He follows commands and can lift legs and arms to command Reflexes are intact  Dr. Michaell Cowing reports most morning seems more awake and as day progresses patient becomes combative and refusing care.   Objective    Interim History:   Telemetry:    Objective: Filed Vitals:   11/29/15 1030 11/29/15 1100 11/29/15 1400 11/30/15 0328  BP: 128/77 135/80 126/86 149/91  Pulse: 76 70 72 62  Temp: 97 F (36.1 C)  98.4 F (36.9 C) 98.4 F (36.9 C)  TempSrc: Axillary  Oral Axillary  Resp: Height:      Weight:      SpO2: 100%  96% 97%    Intake/Output Summary (Last 24 hours) at 11/30/15 1401 Last data filed at 11/30/15 1322  Gross per 24 hour  Intake 1542.5 ml  Output      0 ml  Net 1542.5 ml    Exam:  General: Alert but disoriented combative Cardiovascular: S1-S2 no murmur rub or gallop Respiratory: Clinically clear no added sound Abdomen: Soft, laparoscopy scars noted abdomen no distention no rebound Neurologically intact but slighly confused.  No focal deficit noted.   Data Reviewed: Basic Metabolic Panel:  Recent Labs Lab 11/25/15 1647 11/26/15 0734 11/27/15 0629 11/28/15 0544 11/29/15 0633 11/29/15 0820  NA 141 137 140 143 144 145  K 5.4* 4.7 4.0 4.0 4.8 4.4  CL 100* 103 97*  100* 103 101  CO2 GLUCOSE 96 149* 97 83 83 84  BUN 24* 32* 18 28* 48* 49*  CREATININE 6.61* 7.82* 6.53* 8.56* 10.81* 10.73*  CALCIUM 9.3 9.8 9.2 9.6 9.4 9.5  MG  --   --  2.0  --   --   --   PHOS 6.2* 7.2*  --  6.3*  --  7.8*   Liver Function Tests:  Recent Labs Lab 11/25/15 1647 11/26/15 0734 11/27/15 0629 11/28/15 0544 11/29/15 0820  AST  --   --  21  --   --   ALT  --   --  6*  --   --   ALKPHOS  --   --  50  --   --   BILITOT  --   --  0.6  --   --   PROT  --   --  6.3*  --   --   ALBUMIN 3.3* 3.1* 3.1* 3.5 3.5   No results for input(s): LIPASE, AMYLASE in the last 168 hours. No results for input(s): AMMONIA in the last 168 hours. CBC:  Recent Labs Lab 11/26/15 0733 11/26/15 1932 11/27/15 0629 11/27/15 0747 11/28/15 0544 11/29/15 0820  WBC 6.2 8.0 7.2  --  8.3 8.6  NEUTROABS 3.7  --   --   --   --  6.3  HGB 10.0* 9.9* 9.5*  --  10.2* 10.0*  HCT 31.7* 31.7* 30.4* 28.8* 31.4* 30.1*  MCV 101.0* 100.3* 101.0*  --  101.9* 100.3*  PLT 151 125* 125*  --  142* 174   Cardiac Enzymes:  Recent Labs Lab 11/27/15 0747  TROPONINI 0.07*   BNP: Invalid input(s): POCBNP CBG: No results for input(s): GLUCAP in the last 168 hours.  Recent Results (from the past 240 hour(s))  Culture, blood (Routine X 2) w Reflex to ID Panel     Status: None (Preliminary result)   Collection Time: 11/27/15  7:00 AM  Result Value Ref Range Status   Specimen Description BLOOD RIGHT ANTECUBITAL  Final   Special Requests BOTTLES DRAWN AEROBIC AND ANAEROBIC 10CCS  Final   Culture NO GROWTH 3 DAYS  Final   Report Status PENDING  Incomplete  Culture, blood (Routine X 2) w Reflex to ID Panel     Status: None (Preliminary result)   Collection Time: 11/27/15  7:15 AM  Result Value Ref Range Status   Specimen Description BLOOD RIGHT HAND  Final   Special Requests BOTTLES DRAWN AEROBIC AND ANAEROBIC 10CCS  Final   Culture NO GROWTH 3 DAYS  Final   Report Status PENDING   Incomplete     Studies:              All Imaging reviewed and is as per above notation   Scheduled Meds: . bisacodyl  10 mg Rectal Daily  . calcium acetate  2,001 mg Oral TID WC  . doxercalciferol  2 mcg Intravenous Q M,W,F-HD  . enoxaparin (LOVENOX) injection  30 mg Subcutaneous Q24H  . feeding supplement (NEPRO CARB STEADY)  237 mL Oral BID BM  . ferric gluconate (FERRLECIT/NULECIT) IV  62.5 mg Intravenous Q Fri-HD  . finasteride  5 mg Oral Daily  . metoprolol succinate  50 mg Oral QHS  . multivitamin  1 tablet Oral QHS  . sodium chloride flush  3 mL Intravenous Q12H  . tamsulosin  0.4 mg Oral QPC supper   Continuous Infusions: . dextrose 75 mL/hr at 11/30/15 0610     Assessment/Plan:  1. Recent elective Hernia repair-Rest Per Gen surg 2. Toxic metabolic encephalopathy-DDX Infectious vs Peri-op delirium-CXR L basilar atelectasis. BC x2 11/27/15 ng x 3 days, LFTs normal, WBC normal, no fever curve-additionally moved all 4 limbs on exam reflexes intact--Do not really feel needs CT head.  Abd soft without tenderness adn no WBC so defer Ct abd pelvis--suspect post-op delirium "He always gets like this when he is hospitalized"-per son.  Continue Ativan Im 0.5 5 times daily No focal deficit-doubt will need CT head.   curbsided psychiatry 2/21-lower dose Risperdal M 0.5 disintegrating tablets bid -->OD as Qtc ~ 500.  Nursing asked to keep only posey belt on.  Can allow hands to be free to eat.  RN to inform [facility] if any changes 3. Anemia-stable at current time. Continue Nulecit 62.5 every Friday per nephrology. Recheck iron stores and saturations every 2 weekly? 4. Htn-continue Metoprolol XL 50 daily.  Amlodipine on hold for now 5. BPH-continue Finasteride 5 daily and FLomax 0.4.  Changed dose to daily, not BID 6. ESRD MWF-signed off early 2/20 after 3 hours-Per Nephro-currently stable 7. COPD prior smoker half-pack per day-stable.  No wheeze 8. Prior history of enterococcal urinary  infection/also has history of BPH-unlikely but possible source for infectious causes. Monitor.   We will continue to follow and hope for improvement in  mental status. I discussed with both his son and daughter on 2/21 More alert 2/21-hope that delirium clears.      Pleas Koch, MD  [facility] Pager [phone] 11/30/2015, 2:01 PM

## 2015-12-01 DIAGNOSIS — G9340 Encephalopathy, unspecified: Secondary | ICD-10-CM

## 2015-12-01 DIAGNOSIS — I10 Essential (primary) hypertension: Secondary | ICD-10-CM

## 2015-12-01 DIAGNOSIS — E859 Amyloidosis, unspecified: Secondary | ICD-10-CM | POA: Diagnosis present

## 2015-12-01 DIAGNOSIS — K4090 Unilateral inguinal hernia, without obstruction or gangrene, not specified as recurrent: Secondary | ICD-10-CM | POA: Diagnosis present

## 2015-12-01 DIAGNOSIS — N433 Hydrocele, unspecified: Secondary | ICD-10-CM | POA: Diagnosis present

## 2015-12-01 DIAGNOSIS — D631 Anemia in chronic kidney disease: Secondary | ICD-10-CM | POA: Diagnosis not present

## 2015-12-01 DIAGNOSIS — G92 Toxic encephalopathy: Secondary | ICD-10-CM | POA: Diagnosis not present

## 2015-12-01 DIAGNOSIS — I120 Hypertensive chronic kidney disease with stage 5 chronic kidney disease or end stage renal disease: Secondary | ICD-10-CM | POA: Diagnosis not present

## 2015-12-01 DIAGNOSIS — N401 Enlarged prostate with lower urinary tract symptoms: Secondary | ICD-10-CM | POA: Diagnosis not present

## 2015-12-01 DIAGNOSIS — N186 End stage renal disease: Secondary | ICD-10-CM

## 2015-12-01 DIAGNOSIS — Z992 Dependence on renal dialysis: Secondary | ICD-10-CM

## 2015-12-01 DIAGNOSIS — K4020 Bilateral inguinal hernia, without obstruction or gangrene, not specified as recurrent: Secondary | ICD-10-CM | POA: Diagnosis present

## 2015-12-01 DIAGNOSIS — G928 Other toxic encephalopathy: Secondary | ICD-10-CM | POA: Diagnosis present

## 2015-12-01 LAB — CBC
HEMATOCRIT: 30.8 % — AB (ref 39.0–52.0)
Hemoglobin: 10.1 g/dL — ABNORMAL LOW (ref 13.0–17.0)
MCH: 33.1 pg (ref 26.0–34.0)
MCHC: 32.8 g/dL (ref 30.0–36.0)
MCV: 101 fL — ABNORMAL HIGH (ref 78.0–100.0)
PLATELETS: 185 10*3/uL (ref 150–400)
RBC: 3.05 MIL/uL — ABNORMAL LOW (ref 4.22–5.81)
RDW: 14 % (ref 11.5–15.5)
WBC: 8 10*3/uL (ref 4.0–10.5)

## 2015-12-01 LAB — RENAL FUNCTION PANEL
ALBUMIN: 2.9 g/dL — AB (ref 3.5–5.0)
Anion gap: 18 — ABNORMAL HIGH (ref 5–15)
BUN: 48 mg/dL — AB (ref 6–20)
CO2: 27 mmol/L (ref 22–32)
CREATININE: 9.82 mg/dL — AB (ref 0.61–1.24)
Calcium: 9.4 mg/dL (ref 8.9–10.3)
Chloride: 96 mmol/L — ABNORMAL LOW (ref 101–111)
GFR, EST AFRICAN AMERICAN: 5 mL/min — AB (ref >60)
GFR, EST NON AFRICAN AMERICAN: 5 mL/min — AB (ref >60)
Glucose, Bld: 115 mg/dL — ABNORMAL HIGH (ref 65–99)
PHOSPHORUS: 8 mg/dL — AB (ref 2.5–4.6)
POTASSIUM: 4.1 mmol/L (ref 3.5–5.1)
Sodium: 141 mmol/L (ref 135–145)

## 2015-12-01 MED ORDER — NEPRO/CARBSTEADY PO LIQD: 237.0000 mL | Freq: Two times a day (BID) | ORAL | Status: DC

## 2015-12-01 MED ORDER — DOXERCALCIFEROL 4 MCG/2ML IV SOLN
INTRAVENOUS | Status: AC
  Administered 2015-12-01: 2 ug via INTRAVENOUS
  Filled 2015-12-01: qty 2

## 2015-12-01 NOTE — Progress Notes (Signed)
Discharge instructions reviewed with pt and pt's son and prescriptions given.  Pt and pt's son verbalized understanding and had no questions.  Pt discharged in stable condition via wheelchair with son.  Hector [place]

## 2015-12-01 NOTE — Progress Notes (Signed)
Subjective:  Agitation resolved, today is awake and cooperative, knows place not year yet  Objective Vital signs in last 24 hours: Filed Vitals:   12/01/15 0828 12/01/15 0900 12/01/15 0925 12/01/15 1000  BP: 101/47 85/41 116/97 105/64  Pulse: 86 80 73 78  Temp:      TempSrc:      Resp: Height:      Weight:      SpO2:       Weight change: 0.1 kg (3.5 oz)  Physical Exam: General: alert, cooperative, no restraints  Heart: RRR no m, r, g  Lungs: clear bilat Abdomen: soft , NT, ND/ bs pos. Extremities: no pedal edema   Dialysis Access: pod bruit LUA AVF    OP Dialysis: [facility] 4 hr EDW 64 3k 2.25 Ca 400/800 heparin 2000 hectorol 2 venofer 50/Friday Mircera 50 q 6 weeks - last 2/10 last hgb 11 P 6-7 iPTH 237 Ca ok AccessL left upper AVF  Assessment: 1 SP hernia repair w excision of hydrocele- on 2/16 2 Acute encephalopathy - prob underlying dementia w sundowning. Much better today.  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 7 Nutrition - alb  3.5  And stable last 2 days 8 Tobacco abuse 9 Dispo - per primary, ok for dc from our standpoint  Plan - HD today, no UF  Vinson Moselle MD [facility] pager [phone]    cell [phone] 12/01/2015, 10:13 AM     Labs: Basic Metabolic Panel:  Recent Labs Lab 11/28/15 0544 11/29/15 0633 11/29/15 0820 12/01/15 0738  NA 143 144 145 141  K 4.0 4.8 4.4 4.1  CL 100* 103 101 96*  CO2 GLUCOSE 83 83 84 115*  BUN 28* 48* 49* 48*  CREATININE 8.56* 10.81* 10.73* 9.82*  CALCIUM 9.6 9.4 9.5 9.4  PHOS 6.3*  --  7.8* 8.0*   Liver Function Tests:  Recent Labs Lab 11/27/15 0629 11/28/15 0544 11/29/15 0820 12/01/15 0738  AST 21  --   --   --   ALT 6*  --   --   --   ALKPHOS 50  --   --   --   BILITOT 0.6   --   --   --   PROT 6.3*  --   --   --   ALBUMIN 3.1* 3.5 3.5 2.9*   No results for input(s): LIPASE, AMYLASE in the last 168 hours. No results for input(s): AMMONIA in the last 168 hours. CBC:  Recent Labs Lab 11/26/15 0733 11/26/15 1932 11/27/15 0629  11/28/15 0544 11/29/15 0820 12/01/15 0738  WBC 6.2 8.0 7.2  --  8.3 8.6 8.0  NEUTROABS 3.7  --   --   --   --  6.3  --   HGB 10.0* 9.9* 9.5*  --  10.2* 10.0* 10.1*  HCT 31.7* 31.7* 30.4*  < > 31.4* 30.1* 30.8*  MCV 101.0* 100.3* 101.0*  --  101.9* 100.3* 101.0*  PLT 151 125* 125*  --  142* 174 185  < > = values in this interval not displayed. Cardiac Enzymes:  Recent Labs Lab 11/27/15 0747  TROPONINI 0.07*   CBG: No results for input(s): GLUCAP  in the last 168 hours.  Studies/Results: Ct Head Wo Contrast  11/30/2015  CLINICAL DATA:  Confusion. EXAM: CT HEAD WITHOUT CONTRAST TECHNIQUE: Contiguous axial images were obtained from the base of the skull through the vertex without intravenous contrast. COMPARISON:  04/12/2015 FINDINGS: There is no evidence for acute hemorrhage, hydrocephalus, mass lesion, or abnormal extra-axial fluid collection. No definite CT evidence for acute infarction. Diffuse loss of parenchymal volume is consistent with atrophy. Patchy low attenuation in the deep hemispheric and periventricular white matter is nonspecific, but likely reflects chronic microvascular ischemic demyelination. 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 The visualized paranasal sinuses and mastoid air cells are clear. IMPRESSION: 1. No acute intracranial abnormality. Atrophy with chronic small vessel white matter ischemic disease. 2. 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 Electronically Signed   By: Kennith Center M.D.   On: 11/30/2015 17:25   Medications:   . bisacodyl  10 mg Rectal Daily  . calcium acetate  2,001 mg Oral TID WC  . doxercalciferol  2  mcg Intravenous Q M,W,F-HD  . enoxaparin (LOVENOX) injection  30 mg Subcutaneous Q24H  . feeding supplement (NEPRO CARB STEADY)  237 mL Oral BID BM  . ferric gluconate (FERRLECIT/NULECIT) IV  62.5 mg Intravenous Q Fri-HD  . finasteride  5 mg Oral Daily  . metoprolol succinate  50 mg Oral QHS  . multivitamin  1 tablet Oral QHS  . tamsulosin  0.4 mg Oral QPC supper

## 2015-12-01 NOTE — Progress Notes (Signed)
Paged Dr. Arlean Hopping to advise patient's weight this am 62.1kg and SBP in 80's, received orders to decrease goal and run patient even.  Will continue to monitor patient.

## 2015-12-01 NOTE — Care Management Note (Addendum)
Case Management Note  Patient Details  Name: Shane [name] MRN: [id number] Date of Birth: 04-06-1941  Subjective/Objective:                    Action/Plan:  [facility] is able to provide RN, PT, OT, aide and SW. Cleda Mccreedy will call son. [facility] will make visit tomorrow 12-02-15 . Son aware and agreeable.  [facility] unable to see patient until April 1 , 2017 . Discussed other agencies with son .  Patient and son would like [facility] for home health ( wife has services through [facility] ) . Called [facility] [phone], spoke with [name] , they can provide [facility], PT, OT, aide, SW. [name] will call patient's son and schedule appointment for tomorrow to enroll them in their program. Called [name] and left voice mail.   [facility] unable to take [facility] due to [facility] address. Called patient's room spoke with [name]. He will call his sister and call me back with choice . [name] has copy of agency list .  Spoke with patient and son at bedside.  Explained PT recommendation SNF . Patient refusing SNF , wanting to go home . Son at bedside aware .    Plan is to go home with home health . Son states family are able to provide assistance for patient and his wife.   Choice offered , they would like [facility] , referral given. Patient has cane at home already. Expected Discharge Date:                  Expected Discharge Plan:  Home w Home Health Services  In-House Referral:     Discharge planning Services  CM Consult  Post Acute Care Choice:  Home Health Choice offered to:  Patient, Adult Children  DME Arranged:    DME Agency:     HH Arranged:  RN, PT, OT, Nurse's Aide, Social Work [employer] Agency:  [facility]  Status of Service:  Completed, signed off  Medicare Important Message Given:    Date Medicare IM Given:    Medicare IM give by:    Date Additional Medicare IM Given:    Additional Medicare Important Message give by:     If discussed at Long  Length of Stay Meetings, dates discussed:    Additional Comments:  Kingsley Plan, RN 12/01/2015, 12:16 PM

## 2015-12-01 NOTE — Discharge Instructions (Signed)
HERNIA REPAIR: POST OP INSTRUCTIONS ° °1. DIET: Follow a light bland diet the first 24 hours after arrival home, such as soup, liquids, crackers, etc.  Be sure to include lots of fluids daily.  Avoid fast food or heavy meals as your are more likely to get nauseated.  Eat a low fat the next few days after surgery. °2. Take your usually prescribed home medications unless otherwise directed. °3. PAIN CONTROL: °a. Pain is best controlled by a usual combination of three different methods TOGETHER: °i. Ice/Heat °ii. Over the counter pain medication °iii. Prescription pain medication °b. Most patients will experience some swelling and bruising around the hernia(s) such as the bellybutton, groins, or old incisions.  Ice packs or heating pads (30-60 minutes up to 6 times a day) will help. Use ice for the first few days to help decrease swelling and bruising, then switch to heat to help relax tight/sore spots and speed recovery.  Some people prefer to use ice alone, heat alone, alternating between ice & heat.  Experiment to what works for you.  Swelling and bruising can take several weeks to resolve.   °c. It is helpful to take an over-the-counter pain medication regularly for the first few weeks.  Choose one of the following that works best for you: °i. Naproxen (Aleve, etc)  Two 220mg tabs twice a day °ii. Ibuprofen (Advil, etc) Three 200mg tabs four times a day (every meal & bedtime) °iii. Acetaminophen (Tylenol, etc) 325-650mg four times a day (every meal & bedtime) °d. A  prescription for pain medication should be given to you upon discharge.  Take your pain medication as prescribed.  °i. If you are having problems/concerns with the prescription medicine (does not control pain, nausea, vomiting, rash, itching, etc), please call us [phone] to see if we need to switch you to a different pain medicine that will work better for you and/or control your side effect better. °ii. If you need a refill on your pain  medication, please contact your pharmacy.  They will contact our office to request authorization. Prescriptions will not be filled after 5 pm or on week-ends. °4. Avoid getting constipated.  Between the surgery and the pain medications, it is common to experience some constipation.  Increasing fluid intake and taking a fiber supplement (such as Metamucil, Citrucel, FiberCon, MiraLax, etc) 1-2 times a day regularly will usually help prevent this problem from occurring.  A mild laxative (prune juice, Milk of Magnesia, MiraLax, etc) should be taken according to package directions if there are no bowel movements after 48 hours.   °5. Wash / shower every day.  You may shower over the dressings as they are waterproof.   °6. Remove your waterproof bandages 5 days after surgery.  You may leave the incision open to air.  You may replace a dressing/Band-Aid to cover the incision for comfort if you wish.  Continue to shower over incision(s) after the dressing is off. ° ° ° °7. ACTIVITIES as tolerated:   °a. You may resume regular (light) daily activities beginning the next day--such as daily self-care, walking, climbing stairs--gradually increasing activities as tolerated.  If you can walk 30 minutes without difficulty, it is safe to try more intense activity such as jogging, treadmill, bicycling, low-impact aerobics, swimming, etc. °b. Save the most intensive and strenuous activity for last such as sit-ups, heavy lifting, contact sports, etc  Refrain from any heavy lifting or straining until you are off narcotics for pain control.   °  c. DO NOT PUSH THROUGH PAIN.  Let pain be your guide: If it hurts to do something, don't do it.  Pain is your body warning you to avoid that activity for another week until the pain goes down. d. You may drive when you are no longer taking prescription pain medication, you can comfortably wear a seatbelt, and you can safely maneuver your car and apply brakes. e. Dennis Bast may have sexual intercourse  when it is comfortable.  8. FOLLOW UP in our office a. Please call [facility] at [phone] to set up an appointment to see your surgeon in the office for a follow-up appointment approximately 2-3 weeks after your surgery. b. Make sure that you call for this appointment the day you arrive home to insure a convenient appointment time. 9.  IF YOU HAVE DISABILITY OR FAMILY LEAVE FORMS, BRING THEM TO THE OFFICE FOR PROCESSING.  DO NOT GIVE THEM TO YOUR DOCTOR.  WHEN TO CALL us [phone]: 1. Poor pain control 2. Reactions / problems with new medications (rash/itching, nausea, etc)  3. Fever over 101.5 F (38.5 C) 4. Inability to urinate 5. Nausea and/or vomiting 6. Worsening swelling or bruising 7. Continued bleeding from incision. 8. Increased pain, redness, or drainage from the incision   The clinic staff is available to answer your questions during regular business hours (8:30am-5pm).  Please dont hesitate to call and ask to speak to one of our nurses for clinical concerns.   If you have a medical emergency, go to the nearest emergency room or call 911.  A surgeon from [facility] is always on call at the hospitals in [facility], [place], [place], [place], [place]  [address] ?  [address] [address], [place], [place]   [address] MAIN: [phone] ? TOLL FREE: [phone] ? FAX: [phone] [email or web address]  Managing Pain  Pain after surgery or related to activity is often due to strain/injury to muscle, tendon, nerves and/or incisions.  This pain is usually short-term and will improve in a few months.   Many people find it helpful to do the following things TOGETHER to help speed the process of healing and to get back to regular activity more quickly:  1. Avoid heavy physical activity at first a. No lifting greater than 20 pounds at first, then increase to lifting as tolerated over the next few weeks b. Do not push  through the pain.  Listen to your body and avoid positions and maneuvers than reproduce the pain.  Wait a few days before trying something more intense c. Walking is okay as tolerated, but go slowly and stop when getting sore.  If you can walk 30 minutes without stopping or pain, you can try more intense activity (running, jogging, aerobics, cycling, swimming, treadmill, sex, sports, weightlifting, etc ) d. Remember: If it hurts to do it, then dont do it!  2. Take Acetaminophen Anti-inflammatory medication i. Acetaminophen 500mg  tabs (Tylenol) 1-2 pills with every meal and just before bedtime (avoid if you have liver problems) ii. Take with food/snack around the clock for 1-2 weeks iii. This helps the muscle and nerve tissues become less irritable and calm down faster  3. Use a Heating pad or Ice/Cold Pack a. 4-6 times a day b. May use warm bath/hottub  or showers  4. Try Gentle Massage and/or Stretching  a. at the area of pain many times a day b. stop if you feel pain - do not overdo  it  Try these steps together to help you body heal faster and avoid making things get worse.  Doing just one of these things may not be enough.    If you are not getting better after two weeks or are noticing you are getting worse, contact our office for further advice; we may need to re-evaluate you & see what other things we can do to help.  GETTING TO GOOD BOWEL HEALTH. Irregular bowel habits such as constipation and diarrhea can lead to many problems over time.  Having one soft bowel movement a day is the most important way to prevent further problems.  The anorectal canal is designed to handle stretching and feces to safely manage our ability to get rid of solid waste (feces, poop, stool) out of our body.  BUT, hard constipated stools can act like ripping concrete bricks and diarrhea can be a burning fire to this very sensitive area of our body, causing inflamed hemorrhoids, anal fissures, increasing risk  is perirectal abscesses, abdominal pain/bloating, an making irritable bowel worse.      The goal: ONE SOFT BOWEL MOVEMENT A DAY!  To have soft, regular bowel movements:   Drink plenty of fluids, consider 4-6 tall glasses of water a day.    Take plenty of fiber.  Fiber is the undigested part of plant food that passes into the colon, acting s natures broom to encourage bowel motility and movement.  Fiber can absorb and hold large amounts of water. This results in a larger, bulkier stool, which is soft and easier to pass. Work gradually over several weeks up to 6 servings a day of fiber (25g a day even more if needed) in the form of: o Vegetables -- Root (potatoes, carrots, turnips), leafy green (lettuce, salad greens, celery, spinach), or cooked high residue (cabbage, broccoli, etc) o Fruit -- Fresh (unpeeled skin & pulp), Dried (prunes, apricots, cherries, etc ),  or stewed ( applesauce)  o Whole grain breads, pasta, etc (whole wheat)  o Bran cereals   Bulking Agents -- This type of water-retaining fiber generally is easily obtained each day by one of the following:  o Psyllium bran -- The psyllium plant is remarkable because its ground seeds can retain so much water. This product is available as Metamucil, Konsyl, Effersyllium, Per Diem Fiber, or the less expensive generic preparation in drug and health food stores. Although labeled a laxative, it really is not a laxative.  o Methylcellulose -- This is another fiber derived from wood which also retains water. It is available as Citrucel. o Polyethylene Glycol - and artificial fiber commonly called Miralax or Glycolax.  It is helpful for people with gassy or bloated feelings with regular fiber o Flax Seed - a less gassy fiber than psyllium  No reading or other relaxing activity while on the toilet. If bowel movements take longer than 5 minutes, you are too constipated  AVOID CONSTIPATION.  High fiber and water intake usually takes care of  this.  Sometimes a laxative is needed to stimulate more frequent bowel movements, but   Laxatives are not a good long-term solution as it can wear the colon out.  They can help jump-start bowels if constipated, but should be relied on constantly without discussing with your doctor o Osmotics (Milk of Magnesia, Fleets phosphosoda, Magnesium citrate, MiraLax, GoLytely) are safer than  o Stimulants (Senokot, Castor Oil, Dulcolax, Ex Lax)    o Avoid taking laxatives for more than 7 days in a row.  IF SEVERELY CONSTIPATED, try a Bowel Retraining Program: o Do not use laxatives.  o Eat a diet high in roughage, such as bran cereals and leafy vegetables.  o Drink six (6) ounces of prune or apricot juice each morning.  o Eat two (2) large servings of stewed fruit each day.  o Take one (1) heaping tablespoon of a psyllium-based bulking agent twice a day. Use sugar-free sweetener when possible to avoid excessive calories.  o Eat a normal breakfast.  o Set aside 15 minutes after breakfast to sit on the toilet, but do not strain to have a bowel movement.  o If you do not have a bowel movement by the third day, use an enema and repeat the above steps.   Controlling diarrhea o Switch to liquids and simpler foods for a few days to avoid stressing your intestines further. o Avoid dairy products (especially milk & ice cream) for a short time.  The intestines often can lose the ability to digest lactose when stressed. o Avoid foods that cause gassiness or bloating.  Typical foods include beans and other legumes, cabbage, broccoli, and dairy foods.  Every person has some sensitivity to other foods, so listen to our body and avoid those foods that trigger problems for you. o Adding fiber (Citrucel, Metamucil, psyllium, Miralax) gradually can help thicken stools by absorbing excess fluid and retrain the intestines to act more normally.  Slowly increase the dose over a few weeks.  Too much fiber too soon can backfire  and cause cramping & bloating. o Probiotics (such as active yogurt, Align, etc) may help repopulate the intestines and colon with normal bacteria and calm down a sensitive digestive tract.  Most studies show it to be of mild help, though, and such products can be costly. o Medicines: - Bismuth subsalicylate (ex. Kayopectate, Pepto Bismol) every 30 minutes for up to 6 doses can help control diarrhea.  Avoid if pregnant. - Loperamide (Immodium) can slow down diarrhea.  Start with two tablets (  total) first and then try one tablet every 6 hours.  Avoid if you are having fevers or severe pain.  If you are not better or start feeling worse, stop all medicines and call your doctor for advice o Call your doctor if you are getting worse or not better.  Sometimes further testing (cultures, endoscopy, X-ray studies, bloodwork, etc) may be needed to help diagnose and treat the cause of the diarrhea.  TROUBLESHOOTING IRREGULAR BOWELS 1) Avoid extremes of bowel movements (no bad constipation/diarrhea) 2) Miralax 17gm mixed in 8oz. water or juice-daily. May use BID as needed.  3) Gas-x,Phazyme, etc. as needed for gas & bloating.  4) Soft,bland diet. No spicy,greasy,fried foods.  5) Prilosec over-the-counter as needed  6) May hold gluten/wheat products from diet to see if symptoms improve.  7)  May try probiotics (Align, Activa, etc) to help calm the bowels down 7) If symptoms become worse call back immediately.  Delirium Delirium is a state of mental confusion. It comes on quickly and causes significant changes in a person's thinking and behavior. People with delirium usually have trouble paying attention to what is going on or knowing where they are. They may become very withdrawn or very emotional and unable to sit still. They may even see or feel things that are not there (hallucinations). Delirium is a sign of a serious underlying medical condition. CAUSES Delirium occurs when something suddenly affects  the signals that the brain sends out. Brain signals can be  affected by anything that puts severe stress on the body and brain and causes brain chemicals to be out of balance. The most common causes of delirium include:  Infections. These may be bacterial, viral, fungal, or protozoal.  Medicines. These include many over-the-counter and prescription medicines.  Recreational drugs.  Substance withdrawal. This occurs with sudden discontinuation of alcohol, certain medicines, or recreational drugs.  Surgery.  Sudden vascular events, such as stroke, brain hemorrhage, and severe migraine.  Other brain disorders, such as tumors, seizures, and physical head trauma.  Metabolic disorders, such as kidney or liver failure.  Low blood oxygen (anoxia). This may occur with lung disease, cardiac arrest, or carbon monoxide poisoning.  Hormone imbalances (endocrinopathies), such as an overactive thyroid (hyperthyroidism) or underactive thyroid (hypothyroidism).  Vitamin deficiencies. RISK FACTORS This condition is more likely to develop in:  Children.  Older people.  People who live alone.  People who have vision loss or hearing loss.  People who have existing brain disease, such as dementia.  People who have long-lasting (chronic) medical conditions, such as heart disease.  People who are hospitalized for long periods of time. SYMPTOMS Delirium starts with a sudden change in a person's thinking or behavior. Symptoms come and go (fluctuate) over time, and they are often worse at the end of the day. Symptoms include:  Not being able to stay awake (drowsiness) or pay attention.  Being confused about places, time, and people.  Forgetfulness.  Having extreme energy levels. These may be low or high.  Changes in sleep patterns.  Extreme mood swings, such as anger or anxiety.  Focusing on things or ideas that are not important.  Rambling and senseless talking.  Difficulty speaking,  understanding speech, or both.  Hallucinations.  Tremor or unsteady gait. DIAGNOSIS People with delirium may not realize that they have the condition. Often, a family member or health care provider is the first person to notice the changes. The health care provider will obtain a detailed history of current symptoms, medical issues, medicines, and recreational drug use. The health care provider will perform a mental status examination by:  Asking questions to check for confusion.  Watching for abnormal behavior. The health care provider may perform a physical exam and order lab tests or additional studies to determine the cause of the delirium. TREATMENT Treatment of delirium depends on the cause and severity. Delirium usually goes away within days or weeks of treating the underlying cause. In the meantime, the person should not be left alone because he or she may accidentally cause self-harm. Treatment includes supportive care, such as:  Increased light during the day and decreased light at night.  Low noise level.  Uninterrupted sleep.  A regular daily schedule.  Clocks and calendars to help with orientation.  Familiar objects, including the person's pictures and clothing.  Frequent visits from familiar family and friends.  Healthy diet.  Exercise. In more severe cases of delirium, medicine may be prescribed to help the person to keep calm and think more clearly. HOME CARE INSTRUCTIONS  Any supportive care should be continued as told by the health care provider.  All medicines should be used as told by the health care provider. This is important.  The health care provider should be consulted before over-the-counter medicines, herbs, or supplements are used.  All follow-up visits should be kept as told by the health care provider. This is important.  Alcohol and recreational drugs should be avoided as told by the health care provider. SEEK  MEDICAL CARE IF:  Symptoms do  not get better or they become worse.  New symptoms of delirium develop.  Caring for the person at home does not seem safe.  Eating, drinking, or communicating stops.  There are side effects of medicines, such as changes in sleep patterns, dizziness, weight gain, restlessness, movement changes, or tremors. SEEK IMMEDIATE MEDICAL CARE IF:  Serious thoughts occur about self-harm or about hurting others.  There are serious side effects of medicine, such as:  Swelling of the face, lips, tongue, or throat.  Fever, confusion, muscle spasms, or seizures.   This information is not intended to replace advice given to you by your health care provider. Make sure you discuss any questions you have with your health care provider.   Document Released: 06/19/2012 Document Revised: 02/09/2015 Document Reviewed: 11/18/2014 Elsevier Interactive Patient Education [employer]

## 2015-12-01 NOTE — Progress Notes (Signed)
OT Cancellation Note  Patient Details Name: Shane [name] MRN: [id number] DOB: 1941-05-01   Cancelled Treatment:    Reason Eval/Treat Not Completed: Patient at procedure or test/ unavailable. Pt currently in hemodialysis.  Evette Georges [phone] 12/01/2015, 7:57 AM

## 2015-12-01 NOTE — Evaluation (Signed)
Occupational Therapy Evaluation Patient Details Name: JOHNRYAN SAO MRN: [id number] DOB: September 04, 1941 Today's Date: 12/01/2015    History of Present Illness 74 y.o. male admitted to [facility] on 11/24/14 laparascopic bilateral inguinal hernia repair on 11/25/15.  Post op course complicated by AMS with unclear cause (medical workup in progress).  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     Clinical Impression   This 75 yo male admitted with above presents to acute OT with deficits below affecting his ability to safely and independently take care of himself at an independent/Mod I level level as he was pta. He really would benefit from short term SNF stay, but pt is adamant about going home today. Son in room and aware that I am recommending someone be with pt 24/7 at home. Defer remainder of OT to [facility], acute OT will sign of due to pt D/C;ing today.    Follow Up Recommendations  SNF;Other (comment);Supervision/Assistance - 24 hour (however pt refusing so needs to be set up with HHOT)    Equipment Recommendations  None recommended by OT       Precautions / Restrictions Precautions Precautions: Fall Precaution Comments: pt is very unsteady on his feet and quite impulsive, sits prematurely Restrictions Weight Bearing Restrictions: No      Mobility Bed Mobility               General bed mobility comments: Pt up in recliner upon arrival  Transfers Overall transfer level: Needs assistance Equipment used:  [facility]) Transfers: Sit to/from Stand Sit to Stand: Min guard         General transfer comment: Ambulation pt needs anywhere from min guard A to Mod A to correct balance with use of SPC which he normally uses at home    Balance Overall balance assessment: Needs assistance Sitting-balance support: Feet supported;No upper extremity supported Sitting balance-Leahy Scale: Fair     Standing balance support: Single extremity supported Standing  balance-Leahy Scale: Poor Standing balance comment: Requires support of SPC or more at times                            ADL Overall ADL's : Needs assistance/impaired Eating/Feeding: Independent;Sitting   Grooming: Set up;Min guard;Standing   Upper Body Bathing: Supervision/ safety;Set up;Sitting   Lower Body Bathing: Min guard;Sit to/from stand   Upper Body Dressing : Set up;Sitting   Lower Body Dressing: Min guard;Sit to/from stand Lower Body Dressing Details (indicate cue type and reason): Overheard son to pt in bathroom say a couple of times that pt was putting the wrong leg in pants Toilet Transfer: Min guard;Ambulation;Regular Toilet [facility])   Toileting- Architect and Hygiene: Min guard;Sit to/from stand         General ADL Comments: I heard son say to pt as he was in bathroom with pt,  "If they saw you struggling like this they would not let you go home" (while son was in bathroom with pt with pt getting dressed).. I dicussed this with son and he agrees pt is not safe to go home, but pt will not have anythig but going home. I  told son that I am recommending that  some one be with pt all the time at home until they decided he can be by himself--pt shook his head in agreement of recognizing this.  Pertinent Vitals/Pain Pain Assessment: Faces Faces Pain Scale: Hurts a little bit Pain Location: left groin area Pain Descriptors / Indicators: Sore Pain Intervention(s): Repositioned;Monitored during session     Hand Dominance Right   Extremity/Trunk Assessment Upper Extremity Assessment Upper Extremity Assessment: Overall WFL for tasks assessed           Communication Communication Communication: No difficulties   Cognition Arousal/Alertness: Awake/alert Behavior During Therapy: Impulsive;Restless Overall Cognitive Status: Impaired/Different from baseline Area of Impairment: Following commands;Safety/judgement;Problem solving        Following Commands: Follows one step commands consistently Safety/Judgement: Decreased awareness of safety;Decreased awareness of deficits (with balance defticits he has and my need to stay close by him or have a hand on him due to these balance deficts ("II need to try and catch myself"))   Problem Solving: Difficulty sequencing;Requires verbal cues General Comments: Pt is impulsive. Just came from bathroom, I gave pt his bag of clothes he needed to put on due to D/C today (pt's choice not ours), he walked over to bed and I put lower bed rail down, then he walked to other side of bed and I put other lower bed rail down, then he asked me to put upper bed rail down so I did. He walked to [facility] and started to try and move bed, I ask him why and he said, " I have to get in there" I explained to him tat that was a wall and if he was trying to go into bathroom it was behind him.  He then walked into bathroom and did not want me to stay with him ---I had to get son to stay in bathroom with him while he got dressed.               Home Living Family/patient expects to be discharged to:: Skilled nursing facility Living Arrangements: Spouse/significant other;Children Available Help at Discharge: Family;Available PRN/intermittently Type of Home: House Home Access: Ramped entrance     Home Layout: One level     Bathroom Shower/Tub: Chief Strategy Officer: Handicapped height     Home Equipment: Cane - single point;Shower seat;Grab bars - tub/shower;Hand held shower head   Additional Comments: Pt says that he uses [facility] at home all the time      Prior Functioning/Environment Level of Independence: Independent with assistive device(s)             OT Diagnosis: Generalized weakness;Cognitive deficits   OT Problem List: Decreased cognition;Impaired balance (sitting and/or standing);Decreased safety awareness;Pain;Decreased knowledge of use of DME or AE      OT  Goals(Current goals can be found in the care plan section) Acute Rehab OT Goals Patient Stated Goal: to go home today (pt)  OT Frequency:                End of Session Equipment Utilized During Treatment: Gait belt Nurse Communication:  (OT eval completed, but pt is not safe to go home and be alone--needs A when up n feet (she was aware of this before I told her))  Activity Tolerance: Patient tolerated treatment well Patient left: in chair;with call bell/phone within reach (no chair alarm (pt's son states he will be with him until D/C))   Time: 1208-1239 OT Time Calculation (min): 31 min Charges:  OT General Charges $OT Visit: 1 Procedure OT Evaluation $OT Eval Moderate Complexity: 1 Procedure OT Treatments $Self Care/Home Management : 23-37 mins  Evette Georges [phone] 12/01/2015, 12:54 PM

## 2015-12-01 NOTE — Discharge Summary (Signed)
PATIENT DETAILS Name: Shane [name] Age: 75 y.o. Sex: male Date of Birth: 22-Oct-1940 MRN: [id number]. Admitting Physician: Calvert Cantor, MD BJY:[id number],ZHYQM L, MD  Admit Date: 11/25/2015 Discharge date: 12/01/2015  Recommendations for Outpatient Follow-up:  1. Ensure follow-up with general surgery, nephrology/dialysis center  2. Please repeat CBC/BMET at next visit 3. Refused SNF-being discharged with home health services   PRIMARY DISCHARGE DIAGNOSIS:  Principal Problem:   Acute encephalopathy Active Problems:   Tobacco abuse   Hypertension   CKD (chronic kidney disease) stage V requiring chronic dialysis (HCC)   Amyloidosis (HCC)   Essential hypertension   Enlarged prostate with lower urinary tract symptoms (LUTS)   Bilateral inguinal hernia (BIH) s/p lap repair w mesh 11/25/2015   Hydrocele s/p partial rexction/unroofing 11/25/2015   Toxic metabolic encephalopathy      PAST MEDICAL HISTORY: Past Medical History  Diagnosis Date  . Hypertension   . Bilateral inguinal hernia (BIH)     right > left side  . Tobacco abuse   . Varicocele   . Arthritis     bursitis in hip  . 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 Chronic kidney disease     secondary to amyloidosis AL lambda phenotype, HD MWF Dr. Reynolds Bowl  . Full dentures   . Urinary tract infection due to Enterococcus 08/03/2015  . Elevated troponin 04/12/2015    DISCHARGE MEDICATIONS: Current Discharge Medication List    START taking these medications   Details  Nutritional Supplements (FEEDING SUPPLEMENT, NEPRO CARB STEADY,) LIQD Take 237 mLs by mouth 2 (two) times daily between meals. Qty: 60 Can, Refills: 0    traMADol (ULTRAM) 50 MG tablet Take 1-2 tablets (50-100 mg total) by mouth every 6 (six) hours as needed for moderate pain or severe pain. Qty: 40 tablet, Refills: 0      CONTINUE these medications which have NOT CHANGED   Details  amLODipine  (NORVASC) 10 MG tablet Take 1 tablet (10 mg total) by mouth daily. Qty: 30 tablet, Refills: 11    B Complex-C-Folic Acid (RENAL MULTIVITAMIN FORMULA PO) Take 500 mg by mouth daily.    calcium acetate (PHOSLO) 667 MG capsule Take 2,001 mg by mouth 3 (three) times daily with meals.     finasteride (PROSCAR) 5 MG tablet Take 5 mg by mouth daily.    metoprolol succinate (TOPROL-XL) 50 MG 24 hr tablet Take 50 mg by mouth daily. Take with or immediately following a meal.    Tamsulosin HCl (FLOMAX) 0.4 MG CAPS Take 0.4 mg by mouth 2 (two) times daily.     albuterol-ipratropium (COMBIVENT) 18-103 MCG/ACT inhaler Inhale 2 puffs into the lungs every 6 (six) hours as needed for wheezing. Qty: 1 Inhaler, Refills: 6      STOP taking these medications     doxycycline (VIBRAMYCIN) 100 MG capsule      furosemide (LASIX) 40 MG tablet         ALLERGIES:  No Known Allergies  BRIEF HPI:  See H&P, Labs, Consult and Test reports for all details in brief, patient was admitted for A elective repair of bilateral inguinal hernia-following which he had a prolonged hospital stay secondary to confusion.   CONSULTATIONS:   nephrology and orthopedic surgery  PERTINENT RADIOLOGIC STUDIES: Dg Chest 1 View  11/27/2015  CLINICAL DATA:  Sepsis.  Confusion.  Smoker. EXAM: CHEST 1 VIEW COMPARISON:  08/03/2015. FINDINGS: Stable enlarged cardiac silhouette. The small  amount of atelectasis or scarring at the left lung base. Mild diffuse peribronchial thickening. Mild left shoulder degenerative changes. IMPRESSION: 1. Mild left basilar atelectasis or scarring. 2. Stable cardiomegaly. 3. Mild bronchitic changes. Electronically Signed   By: Beckie Salts M.D.   On: 11/27/2015 09:36   Ct Head Wo Contrast  11/30/2015  CLINICAL DATA:  Confusion. EXAM: CT HEAD WITHOUT CONTRAST TECHNIQUE: Contiguous axial images were obtained from the base of the skull through the vertex without intravenous contrast. COMPARISON:  04/12/2015  FINDINGS: There is no evidence for acute hemorrhage, hydrocephalus, mass lesion, or abnormal extra-axial fluid collection. No definite CT evidence for acute infarction. Diffuse loss of parenchymal volume is consistent with atrophy. Patchy low attenuation in the deep hemispheric and periventricular white matter is nonspecific, but likely reflects chronic microvascular ischemic demyelination. 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 The visualized paranasal sinuses and mastoid air cells are clear. IMPRESSION: 1. No acute intracranial abnormality. Atrophy with chronic small vessel white matter ischemic disease. 2. 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 Electronically Signed   By: Kennith Center M.D.   On: 11/30/2015 17:25     PERTINENT LAB RESULTS: CBC:  Recent Labs  11/29/15 0820 12/01/15 0738  WBC 8.6 8.0  HGB 10.0* 10.1*  HCT 30.1* 30.8*  PLT 174 185   CMET CMP     Component Value Date/Time   NA 141 12/01/2015 0738   K 4.1 12/01/2015 0738   CL 96* 12/01/2015 0738   CO2 27 12/01/2015 0738   GLUCOSE 115* 12/01/2015 0738   BUN 48* 12/01/2015 0738   CREATININE 9.82* 12/01/2015 0738   CALCIUM 9.4 12/01/2015 0738   PROT 6.3* 11/27/2015 0629   ALBUMIN 2.9* 12/01/2015 0738   AST 21 11/27/2015 0629   ALT 6* 11/27/2015 0629   ALKPHOS 50 11/27/2015 0629   BILITOT 0.6 11/27/2015 0629   GFRNONAA 5* 12/01/2015 0738   GFRAA 5* 12/01/2015 0738    GFR Estimated Creatinine Clearance: 5.9 mL/min (by C-G formula based on Cr of 9.82). No results for input(s): LIPASE, AMYLASE in the last 72 hours. No results for input(s): CKTOTAL, CKMB, CKMBINDEX, TROPONINI in the last 72 hours. Invalid input(s): POCBNP No results for input(s): DDIMER in the last 72 hours. No results for input(s): HGBA1C in the last 72 hours. No results for input(s): CHOL, HDL, LDLCALC, TRIG, CHOLHDL, LDLDIRECT in the last 72 hours. No results  for input(s): TSH, T4TOTAL, T3FREE, THYROIDAB in the last 72 hours.  Invalid input(s): FREET3 No results for input(s): VITAMINB12, FOLATE, FERRITIN, TIBC, IRON, RETICCTPCT in the last 72 hours. Coags: No results for input(s): INR in the last 72 hours.  Invalid input(s): PT Microbiology: Recent Results (from the past 240 hour(s))  Culture, blood (Routine X 2) w Reflex to ID Panel     Status: None (Preliminary result)   Collection Time: 11/27/15  7:00 AM  Result Value Ref Range Status   Specimen Description BLOOD RIGHT ANTECUBITAL  Final   Special Requests BOTTLES DRAWN AEROBIC AND ANAEROBIC 10CCS  Final   Culture NO GROWTH 3 DAYS  Final   Report Status PENDING  Incomplete  Culture, blood (Routine X 2) w Reflex to ID Panel     Status: None (Preliminary result)   Collection Time: 11/27/15  7:15 AM  Result Value Ref Range Status   Specimen Description BLOOD RIGHT HAND  Final   Special Requests BOTTLES DRAWN AEROBIC  AND ANAEROBIC 10CCS  Final   Culture NO GROWTH 3 DAYS  Final   Report Status PENDING  Incomplete     BRIEF HOSPITAL COURSE:   Principal Problem:  Acute encephalopathy: Suspect secondary to postoperative bleeding, underwent a workup which was negative for any infection. Managed with supportive measures. By day of discharge, completely awake, alert and oriented 3. He adamantly refuses to go to a skilled nursing facility, he is agreeable to go home with home health services.  Active Problems: Generalized weakness: Secondary to deconditioning from acute illness/prolonged hospital stay. Seen by physical therapy, recommendations were for SNF. However patient adamantly refuses to go to SNF. He wishes to go home with home health services. Explained risks of falling with life-threatening and like disabling effects. He still wants to go home-agreeable for home health services. Note patient's son at bedside.  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 Continue  iron/darbepoetin per nephrology  Hypertension: Continue amlodipine and metoprolol.  ESRD: Continue dialysis per usual schedule-Mondays, Wednesdays and Fridays.  COPD: Lungs are completely clear. This is stable. continue usual inhaler regimen on discharge.  BPH: Continue Flomax  Status post bilateral inguinal hernia repair: Ensure follow-up with surgery.  TODAY-DAY OF DISCHARGE:  Subjective:   Celedonio Sortino today has no headache,no chest abdominal pain,no new weakness tingling or numbness, feels much better wants to go home today.   Objective:   Blood pressure 98/68, pulse 87, temperature 98.1 F (36.7 C), temperature source Oral, resp. rate 20, height 5' 10.5" (1.791 m), weight 63.1 kg (139 lb 1.8 oz), SpO2 96 %.  Intake/Output Summary (Last 24 hours) at 12/01/15 1201 Last data filed at 12/01/15 1051  Gross per 24 hour  Intake 806.25 ml  Output   -500 ml  Net 1306.25 ml   Filed Weights   11/29/15 0804 12/01/15 0700 12/01/15 1051  Weight: 62 kg (136 lb 11 oz) 62.1 kg (136 lb 14.5 oz) 63.1 kg (139 lb 1.8 oz)    Exam Awake Alert, Oriented *3, No new F.N deficits, Normal affect [place].AT,PERRAL Supple Neck,No JVD, No cervical lymphadenopathy appriciated.  Symmetrical Chest wall movement, Good air movement bilaterally, CTAB RRR,No Gallops,Rubs or new Murmurs, No Parasternal Heave +ve B.Sounds, Abd Soft, Non tender, No organomegaly appriciated, No rebound -guarding or rigidity. No Cyanosis, Clubbing or edema, No new Rash or bruise  DISCHARGE CONDITION: Stable  DISPOSITION: Home with home health services  DISCHARGE INSTRUCTIONS:    Activity:  As tolerated with Full fall precautions use walker/cane & assistance as needed  Get Medicines reviewed and adjusted: Please take all your medications with you for your next visit with your Primary MD  Please request your Primary MD to go over all hospital tests and procedure/radiological results at the follow up, please ask your  Primary MD to get all Hospital records sent to his/her office.  If you experience worsening of your admission symptoms, develop shortness of breath, life threatening emergency, suicidal or homicidal thoughts you must seek medical attention immediately by calling 911 or calling your MD immediately  if symptoms less severe.  You must read complete instructions/literature along with all the possible adverse reactions/side effects for all the Medicines you take and that have been prescribed to you. Take any new Medicines after you have completely understood and accpet all the possible adverse reactions/side effects.   Do not drive when taking Pain medications.   Do not take more than prescribed Pain, Sleep and Anxiety Medications  Special Instructions: If you have smoked or  chewed Tobacco  in the last 2 yrs please stop smoking, stop any regular Alcohol  and or any Recreational drug use.  Wear Seat belts while driving.  Please note  You were cared for by a hospitalist during your hospital stay. Once you are discharged, your primary care physician will handle any further medical issues. Please note that NO REFILLS for any discharge medications will be authorized once you are discharged, as it is imperative that you return to your primary care physician (or establish a relationship with a primary care physician if you do not have one) for your aftercare needs so that they can reassess your need for medications and monitor your lab values.   Diet recommendation: Heart Healthy diet  Discharge Instructions    Call MD for:  extreme fatigue    Complete by:  As directed      Call MD for:  extreme fatigue    Complete by:  As directed      Call MD for:  hives    Complete by:  As directed      Call MD for:  hives    Complete by:  As directed      Call MD for:  persistant nausea and vomiting    Complete by:  As directed      Call MD for:  persistant nausea and vomiting    Complete by:  As directed       Call MD for:  redness, tenderness, or signs of infection (pain, swelling, redness, odor or green/yellow discharge around incision site)    Complete by:  As directed      Call MD for:  redness, tenderness, or signs of infection (pain, swelling, redness, odor or green/yellow discharge around incision site)    Complete by:  As directed      Call MD for:  redness, tenderness, or signs of infection (pain, swelling, redness, odor or green/yellow discharge around incision site)    Complete by:  As directed      Call MD for:  severe uncontrolled pain    Complete by:  As directed      Call MD for:  severe uncontrolled pain    Complete by:  As directed      Call MD for:    Complete by:  As directed   Temperature > 101.68F     Call MD for:    Complete by:  As directed   Temperature > 101.68F     Diet - low sodium heart healthy    Complete by:  As directed      Diet - low sodium heart healthy    Complete by:  As directed      Discharge instructions    Complete by:  As directed   Please see discharge instruction sheets.  Also refer to handout given an office.  Please call our office if you have any questions or concerns [phone]     Discharge instructions    Complete by:  As directed   Please see discharge instruction sheets.  Also refer to handout given an office.  Please call our office if you have any questions or concerns [phone]     Discharge wound care:    Complete by:  As directed   If you have closed incisions, shower and bathe over these incisions with soap and water every day.  Remove all surgical dressings on postoperative day #3.  You do not need to replace dressings over the  closed incisions unless you feel more comfortable with a Band-Aid covering it.   If you have an open wound that requires packing, please see wound care instructions.  In general, remove all dressings, wash wound with soap and water and then replace with saline moistened gauze.  Do the dressing change  at least every day.  Please call our office [phone] if you have further questions.     Discharge wound care:    Complete by:  As directed   If you have closed incisions, shower and bathe over these incisions with soap and water every day.  Remove all surgical dressings on postoperative day #3.  You do not need to replace dressings over the closed incisions unless you feel more comfortable with a Band-Aid covering it.   If you have an open wound that requires packing, please see wound care instructions.  In general, remove all dressings, wash wound with soap and water and then replace with saline moistened gauze.  Do the dressing change at least every day.  Please call our office [phone] if you have further questions.     Driving Restrictions    Complete by:  As directed   No driving until off narcotics and can safely swerve away without pain during an emergency     Driving Restrictions    Complete by:  As directed   No driving until off narcotics and can safely swerve away without pain during an emergency     Increase activity slowly    Complete by:  As directed   Walk an hour a day.  Use 20-30 minute walks.  When you can walk 30 minutes without difficulty, it is fine to restart low impact/moderate activities such as biking, jogging, swimming, sexual activity, etc.  Eventually you can increase to unrestricted activity when not feeling pain.  If you feel pain: STOP!Marland Kitchen   Let pain protect you from overdoing it.  Use ice/heat & over-the-counter pain medications to help minimize soreness.  If that is not enough, then use your narcotic pain prescription as needed to remain active.  It is better to take extra pain medications and be more active than to stay bedridden to avoid all pain medications.     Increase activity slowly    Complete by:  As directed   Walk an hour a day.  Use 20-30 minute walks.  When you can walk 30 minutes without difficulty, it is fine to restart low impact/moderate  activities such as biking, jogging, swimming, sexual activity, etc.  Eventually you can increase to unrestricted activity when not feeling pain.  If you feel pain: STOP!Marland Kitchen   Let pain protect you from overdoing it.  Use ice/heat & over-the-counter pain medications to help minimize soreness.  If that is not enough, then use your narcotic pain prescription as needed to remain active.  It is better to take extra pain medications and be more active than to stay bedridden to avoid all pain medications.     Increase activity slowly    Complete by:  As directed      Lifting restrictions    Complete by:  As directed   Avoid heavy lifting initially.  Do not push through pain.  You have no specific weight limit - if it hurts to do, DON'T DO IT.   If you feel no pain, you are not injuring anything.  Pain will protect you from injury.  Coughing and sneezing are far more stressful to your incision than any lifting.  Avoid resuming heavy lifting / intense activity until off all narcotic pain medications.  When ready to exercise more, give yourself 2 weeks to gradually get back to full intense exercise/activity.     Lifting restrictions    Complete by:  As directed   Avoid heavy lifting initially.  Do not push through pain.  You have no specific weight limit - if it hurts to do, DON'T DO IT.   If you feel no pain, you are not injuring anything.  Pain will protect you from injury.  Coughing and sneezing are far more stressful to your incision than any lifting.  Avoid resuming heavy lifting / intense activity until off all narcotic pain medications.  When ready to exercise more, give yourself 2 weeks to gradually get back to full intense exercise/activity.     May shower / Bathe    Complete by:  As directed      May shower / Bathe    Complete by:  As directed      May walk up steps    Complete by:  As directed      May walk up steps    Complete by:  As directed      Sexual Activity Restrictions    Complete by:  As  directed   Sexual activity as tolerated.  Do not push through pain.  Pain will protect you from injury.     Sexual Activity Restrictions    Complete by:  As directed   Sexual activity as tolerated.  Do not push through pain.  Pain will protect you from injury.     Walk with assistance    Complete by:  As directed   Walk over an hour a day.  May use a walker/cane/companion to help with balance and stamina.     Walk with assistance    Complete by:  As directed   Walk over an hour a day.  May use a walker/cane/companion to help with balance and stamina.           Follow-up Information    Follow up with GROSS,STEVEN C., MD. Schedule an appointment as soon as possible for a visit in 3 weeks.   Specialty:  General Surgery   Why:  To follow up after your operation, To follow up after your hospital stay   Contact information:   [address] [place] [address] [phone]       Follow up with [facility] L, MD. Schedule an appointment as soon as possible for a visit in 1 week.   Specialty:  Family Medicine   Why:  Hospital follow up   Contact information:   [address] [place] [address] [phone]       Please follow up.   Contact information:   Dialysis center-M,W,F at your usual schedule     Total Time spent on discharge equals 45 minutes.  SignedJeoffrey Massed 12/01/2015 12:01 PM

## 2015-12-01 NOTE — Progress Notes (Signed)
Completely awake and alert today. Answers all my questions appropriately Follows all my commands Son at bedside-patient is now back to his usual baseline Adamantly refuses SNF-wants to go home-he is agreeable for home health services

## 2015-12-02 DIAGNOSIS — M1991 Primary osteoarthritis, unspecified site: Secondary | ICD-10-CM | POA: Diagnosis not present

## 2015-12-02 DIAGNOSIS — N400 Enlarged prostate without lower urinary tract symptoms: Secondary | ICD-10-CM | POA: Diagnosis not present

## 2015-12-02 DIAGNOSIS — J449 Chronic obstructive pulmonary disease, unspecified: Secondary | ICD-10-CM | POA: Diagnosis not present

## 2015-12-02 DIAGNOSIS — N186 End stage renal disease: Secondary | ICD-10-CM | POA: Diagnosis not present

## 2015-12-02 DIAGNOSIS — I120 Hypertensive chronic kidney disease with stage 5 chronic kidney disease or end stage renal disease: Secondary | ICD-10-CM | POA: Diagnosis not present

## 2015-12-02 DIAGNOSIS — Z87440 Personal history of urinary (tract) infections: Secondary | ICD-10-CM | POA: Diagnosis not present

## 2015-12-02 DIAGNOSIS — Z992 Dependence on renal dialysis: Secondary | ICD-10-CM | POA: Diagnosis not present

## 2015-12-02 DIAGNOSIS — D649 Anemia, unspecified: Secondary | ICD-10-CM | POA: Diagnosis not present

## 2015-12-02 DIAGNOSIS — Z48815 Encounter for surgical aftercare following surgery on the digestive system: Secondary | ICD-10-CM | POA: Diagnosis not present

## 2015-12-02 LAB — CULTURE, BLOOD (ROUTINE X 2)
CULTURE: NO GROWTH
CULTURE: NO GROWTH

## 2015-12-03 DIAGNOSIS — M1991 Primary osteoarthritis, unspecified site: Secondary | ICD-10-CM | POA: Diagnosis not present

## 2015-12-03 DIAGNOSIS — N400 Enlarged prostate without lower urinary tract symptoms: Secondary | ICD-10-CM | POA: Diagnosis not present

## 2015-12-03 DIAGNOSIS — J449 Chronic obstructive pulmonary disease, unspecified: Secondary | ICD-10-CM | POA: Diagnosis not present

## 2015-12-03 DIAGNOSIS — N186 End stage renal disease: Secondary | ICD-10-CM | POA: Diagnosis not present

## 2015-12-03 DIAGNOSIS — N2581 Secondary hyperparathyroidism of renal origin: Secondary | ICD-10-CM | POA: Diagnosis not present

## 2015-12-03 DIAGNOSIS — Z87440 Personal history of urinary (tract) infections: Secondary | ICD-10-CM | POA: Diagnosis not present

## 2015-12-03 DIAGNOSIS — D689 Coagulation defect, unspecified: Secondary | ICD-10-CM | POA: Diagnosis not present

## 2015-12-03 DIAGNOSIS — Z283 Underimmunization status: Secondary | ICD-10-CM | POA: Diagnosis not present

## 2015-12-03 DIAGNOSIS — Z48815 Encounter for surgical aftercare following surgery on the digestive system: Secondary | ICD-10-CM | POA: Diagnosis not present

## 2015-12-03 DIAGNOSIS — Z992 Dependence on renal dialysis: Secondary | ICD-10-CM | POA: Diagnosis not present

## 2015-12-03 DIAGNOSIS — D649 Anemia, unspecified: Secondary | ICD-10-CM | POA: Diagnosis not present

## 2015-12-03 DIAGNOSIS — I120 Hypertensive chronic kidney disease with stage 5 chronic kidney disease or end stage renal disease: Secondary | ICD-10-CM | POA: Diagnosis not present

## 2015-12-03 DIAGNOSIS — D631 Anemia in chronic kidney disease: Secondary | ICD-10-CM | POA: Diagnosis not present

## 2015-12-03 DIAGNOSIS — D509 Iron deficiency anemia, unspecified: Secondary | ICD-10-CM | POA: Diagnosis not present

## 2015-12-04 DIAGNOSIS — M1991 Primary osteoarthritis, unspecified site: Secondary | ICD-10-CM | POA: Diagnosis not present

## 2015-12-04 DIAGNOSIS — D649 Anemia, unspecified: Secondary | ICD-10-CM | POA: Diagnosis not present

## 2015-12-04 DIAGNOSIS — I120 Hypertensive chronic kidney disease with stage 5 chronic kidney disease or end stage renal disease: Secondary | ICD-10-CM | POA: Diagnosis not present

## 2015-12-04 DIAGNOSIS — J449 Chronic obstructive pulmonary disease, unspecified: Secondary | ICD-10-CM | POA: Diagnosis not present

## 2015-12-04 DIAGNOSIS — N186 End stage renal disease: Secondary | ICD-10-CM | POA: Diagnosis not present

## 2015-12-04 DIAGNOSIS — Z48815 Encounter for surgical aftercare following surgery on the digestive system: Secondary | ICD-10-CM | POA: Diagnosis not present

## 2015-12-04 DIAGNOSIS — Z87440 Personal history of urinary (tract) infections: Secondary | ICD-10-CM | POA: Diagnosis not present

## 2015-12-04 DIAGNOSIS — N400 Enlarged prostate without lower urinary tract symptoms: Secondary | ICD-10-CM | POA: Diagnosis not present

## 2015-12-04 DIAGNOSIS — Z992 Dependence on renal dialysis: Secondary | ICD-10-CM | POA: Diagnosis not present

## 2015-12-06 DIAGNOSIS — D649 Anemia, unspecified: Secondary | ICD-10-CM | POA: Diagnosis not present

## 2015-12-06 DIAGNOSIS — N186 End stage renal disease: Secondary | ICD-10-CM | POA: Diagnosis not present

## 2015-12-06 DIAGNOSIS — N400 Enlarged prostate without lower urinary tract symptoms: Secondary | ICD-10-CM | POA: Diagnosis not present

## 2015-12-06 DIAGNOSIS — I120 Hypertensive chronic kidney disease with stage 5 chronic kidney disease or end stage renal disease: Secondary | ICD-10-CM | POA: Diagnosis not present

## 2015-12-06 DIAGNOSIS — D689 Coagulation defect, unspecified: Secondary | ICD-10-CM | POA: Diagnosis not present

## 2015-12-06 DIAGNOSIS — M1991 Primary osteoarthritis, unspecified site: Secondary | ICD-10-CM | POA: Diagnosis not present

## 2015-12-06 DIAGNOSIS — Z48815 Encounter for surgical aftercare following surgery on the digestive system: Secondary | ICD-10-CM | POA: Diagnosis not present

## 2015-12-06 DIAGNOSIS — D509 Iron deficiency anemia, unspecified: Secondary | ICD-10-CM | POA: Diagnosis not present

## 2015-12-06 DIAGNOSIS — Z283 Underimmunization status: Secondary | ICD-10-CM | POA: Diagnosis not present

## 2015-12-06 DIAGNOSIS — Z87440 Personal history of urinary (tract) infections: Secondary | ICD-10-CM | POA: Diagnosis not present

## 2015-12-06 DIAGNOSIS — Z992 Dependence on renal dialysis: Secondary | ICD-10-CM | POA: Diagnosis not present

## 2015-12-06 DIAGNOSIS — D631 Anemia in chronic kidney disease: Secondary | ICD-10-CM | POA: Diagnosis not present

## 2015-12-06 DIAGNOSIS — J449 Chronic obstructive pulmonary disease, unspecified: Secondary | ICD-10-CM | POA: Diagnosis not present

## 2015-12-06 DIAGNOSIS — N2581 Secondary hyperparathyroidism of renal origin: Secondary | ICD-10-CM | POA: Diagnosis not present

## 2015-12-07 DIAGNOSIS — N186 End stage renal disease: Secondary | ICD-10-CM | POA: Diagnosis not present

## 2015-12-07 DIAGNOSIS — E859 Amyloidosis, unspecified: Secondary | ICD-10-CM | POA: Diagnosis not present

## 2015-12-07 DIAGNOSIS — Z992 Dependence on renal dialysis: Secondary | ICD-10-CM | POA: Diagnosis not present

## 2015-12-08 DIAGNOSIS — D689 Coagulation defect, unspecified: Secondary | ICD-10-CM | POA: Diagnosis not present

## 2015-12-08 DIAGNOSIS — D631 Anemia in chronic kidney disease: Secondary | ICD-10-CM | POA: Diagnosis not present

## 2015-12-08 DIAGNOSIS — D509 Iron deficiency anemia, unspecified: Secondary | ICD-10-CM | POA: Diagnosis not present

## 2015-12-08 DIAGNOSIS — N186 End stage renal disease: Secondary | ICD-10-CM | POA: Diagnosis not present

## 2015-12-08 DIAGNOSIS — N2581 Secondary hyperparathyroidism of renal origin: Secondary | ICD-10-CM | POA: Diagnosis not present

## 2015-12-09 DIAGNOSIS — J449 Chronic obstructive pulmonary disease, unspecified: Secondary | ICD-10-CM | POA: Diagnosis not present

## 2015-12-09 DIAGNOSIS — N186 End stage renal disease: Secondary | ICD-10-CM | POA: Diagnosis not present

## 2015-12-09 DIAGNOSIS — N400 Enlarged prostate without lower urinary tract symptoms: Secondary | ICD-10-CM | POA: Diagnosis not present

## 2015-12-09 DIAGNOSIS — Z992 Dependence on renal dialysis: Secondary | ICD-10-CM | POA: Diagnosis not present

## 2015-12-09 DIAGNOSIS — Z87440 Personal history of urinary (tract) infections: Secondary | ICD-10-CM | POA: Diagnosis not present

## 2015-12-09 DIAGNOSIS — D649 Anemia, unspecified: Secondary | ICD-10-CM | POA: Diagnosis not present

## 2015-12-09 DIAGNOSIS — M1991 Primary osteoarthritis, unspecified site: Secondary | ICD-10-CM | POA: Diagnosis not present

## 2015-12-09 DIAGNOSIS — I120 Hypertensive chronic kidney disease with stage 5 chronic kidney disease or end stage renal disease: Secondary | ICD-10-CM | POA: Diagnosis not present

## 2015-12-09 DIAGNOSIS — Z48815 Encounter for surgical aftercare following surgery on the digestive system: Secondary | ICD-10-CM | POA: Diagnosis not present

## 2015-12-10 DIAGNOSIS — N186 End stage renal disease: Secondary | ICD-10-CM | POA: Diagnosis not present

## 2015-12-10 DIAGNOSIS — D689 Coagulation defect, unspecified: Secondary | ICD-10-CM | POA: Diagnosis not present

## 2015-12-10 DIAGNOSIS — D509 Iron deficiency anemia, unspecified: Secondary | ICD-10-CM | POA: Diagnosis not present

## 2015-12-10 DIAGNOSIS — N2581 Secondary hyperparathyroidism of renal origin: Secondary | ICD-10-CM | POA: Diagnosis not present

## 2015-12-10 DIAGNOSIS — D631 Anemia in chronic kidney disease: Secondary | ICD-10-CM | POA: Diagnosis not present

## 2015-12-13 DIAGNOSIS — I120 Hypertensive chronic kidney disease with stage 5 chronic kidney disease or end stage renal disease: Secondary | ICD-10-CM | POA: Diagnosis not present

## 2015-12-13 DIAGNOSIS — Z48815 Encounter for surgical aftercare following surgery on the digestive system: Secondary | ICD-10-CM | POA: Diagnosis not present

## 2015-12-13 DIAGNOSIS — N2581 Secondary hyperparathyroidism of renal origin: Secondary | ICD-10-CM | POA: Diagnosis not present

## 2015-12-13 DIAGNOSIS — D509 Iron deficiency anemia, unspecified: Secondary | ICD-10-CM | POA: Diagnosis not present

## 2015-12-13 DIAGNOSIS — N400 Enlarged prostate without lower urinary tract symptoms: Secondary | ICD-10-CM | POA: Diagnosis not present

## 2015-12-13 DIAGNOSIS — Z87440 Personal history of urinary (tract) infections: Secondary | ICD-10-CM | POA: Diagnosis not present

## 2015-12-13 DIAGNOSIS — D649 Anemia, unspecified: Secondary | ICD-10-CM | POA: Diagnosis not present

## 2015-12-13 DIAGNOSIS — J449 Chronic obstructive pulmonary disease, unspecified: Secondary | ICD-10-CM | POA: Diagnosis not present

## 2015-12-13 DIAGNOSIS — D631 Anemia in chronic kidney disease: Secondary | ICD-10-CM | POA: Diagnosis not present

## 2015-12-13 DIAGNOSIS — M1991 Primary osteoarthritis, unspecified site: Secondary | ICD-10-CM | POA: Diagnosis not present

## 2015-12-13 DIAGNOSIS — N186 End stage renal disease: Secondary | ICD-10-CM | POA: Diagnosis not present

## 2015-12-13 DIAGNOSIS — Z992 Dependence on renal dialysis: Secondary | ICD-10-CM | POA: Diagnosis not present

## 2015-12-13 DIAGNOSIS — D689 Coagulation defect, unspecified: Secondary | ICD-10-CM | POA: Diagnosis not present

## 2015-12-14 DIAGNOSIS — N400 Enlarged prostate without lower urinary tract symptoms: Secondary | ICD-10-CM | POA: Diagnosis not present

## 2015-12-14 DIAGNOSIS — J449 Chronic obstructive pulmonary disease, unspecified: Secondary | ICD-10-CM | POA: Diagnosis not present

## 2015-12-14 DIAGNOSIS — Z48815 Encounter for surgical aftercare following surgery on the digestive system: Secondary | ICD-10-CM | POA: Diagnosis not present

## 2015-12-14 DIAGNOSIS — Z87440 Personal history of urinary (tract) infections: Secondary | ICD-10-CM | POA: Diagnosis not present

## 2015-12-14 DIAGNOSIS — I120 Hypertensive chronic kidney disease with stage 5 chronic kidney disease or end stage renal disease: Secondary | ICD-10-CM | POA: Diagnosis not present

## 2015-12-14 DIAGNOSIS — D649 Anemia, unspecified: Secondary | ICD-10-CM | POA: Diagnosis not present

## 2015-12-14 DIAGNOSIS — N186 End stage renal disease: Secondary | ICD-10-CM | POA: Diagnosis not present

## 2015-12-14 DIAGNOSIS — Z992 Dependence on renal dialysis: Secondary | ICD-10-CM | POA: Diagnosis not present

## 2015-12-14 DIAGNOSIS — M1991 Primary osteoarthritis, unspecified site: Secondary | ICD-10-CM | POA: Diagnosis not present

## 2015-12-15 DIAGNOSIS — D631 Anemia in chronic kidney disease: Secondary | ICD-10-CM | POA: Diagnosis not present

## 2015-12-15 DIAGNOSIS — N186 End stage renal disease: Secondary | ICD-10-CM | POA: Diagnosis not present

## 2015-12-15 DIAGNOSIS — D509 Iron deficiency anemia, unspecified: Secondary | ICD-10-CM | POA: Diagnosis not present

## 2015-12-15 DIAGNOSIS — N2581 Secondary hyperparathyroidism of renal origin: Secondary | ICD-10-CM | POA: Diagnosis not present

## 2015-12-15 DIAGNOSIS — D689 Coagulation defect, unspecified: Secondary | ICD-10-CM | POA: Diagnosis not present

## 2015-12-17 DIAGNOSIS — D689 Coagulation defect, unspecified: Secondary | ICD-10-CM | POA: Diagnosis not present

## 2015-12-17 DIAGNOSIS — N186 End stage renal disease: Secondary | ICD-10-CM | POA: Diagnosis not present

## 2015-12-17 DIAGNOSIS — D631 Anemia in chronic kidney disease: Secondary | ICD-10-CM | POA: Diagnosis not present

## 2015-12-17 DIAGNOSIS — N2581 Secondary hyperparathyroidism of renal origin: Secondary | ICD-10-CM | POA: Diagnosis not present

## 2015-12-17 DIAGNOSIS — D509 Iron deficiency anemia, unspecified: Secondary | ICD-10-CM | POA: Diagnosis not present

## 2015-12-20 DIAGNOSIS — D509 Iron deficiency anemia, unspecified: Secondary | ICD-10-CM | POA: Diagnosis not present

## 2015-12-20 DIAGNOSIS — D689 Coagulation defect, unspecified: Secondary | ICD-10-CM | POA: Diagnosis not present

## 2015-12-20 DIAGNOSIS — D631 Anemia in chronic kidney disease: Secondary | ICD-10-CM | POA: Diagnosis not present

## 2015-12-20 DIAGNOSIS — N2581 Secondary hyperparathyroidism of renal origin: Secondary | ICD-10-CM | POA: Diagnosis not present

## 2015-12-20 DIAGNOSIS — N186 End stage renal disease: Secondary | ICD-10-CM | POA: Diagnosis not present

## 2015-12-21 DIAGNOSIS — D649 Anemia, unspecified: Secondary | ICD-10-CM | POA: Diagnosis not present

## 2015-12-21 DIAGNOSIS — Z992 Dependence on renal dialysis: Secondary | ICD-10-CM | POA: Diagnosis not present

## 2015-12-21 DIAGNOSIS — Z87440 Personal history of urinary (tract) infections: Secondary | ICD-10-CM | POA: Diagnosis not present

## 2015-12-21 DIAGNOSIS — M1991 Primary osteoarthritis, unspecified site: Secondary | ICD-10-CM | POA: Diagnosis not present

## 2015-12-21 DIAGNOSIS — I120 Hypertensive chronic kidney disease with stage 5 chronic kidney disease or end stage renal disease: Secondary | ICD-10-CM | POA: Diagnosis not present

## 2015-12-21 DIAGNOSIS — J449 Chronic obstructive pulmonary disease, unspecified: Secondary | ICD-10-CM | POA: Diagnosis not present

## 2015-12-21 DIAGNOSIS — Z48815 Encounter for surgical aftercare following surgery on the digestive system: Secondary | ICD-10-CM | POA: Diagnosis not present

## 2015-12-21 DIAGNOSIS — N186 End stage renal disease: Secondary | ICD-10-CM | POA: Diagnosis not present

## 2015-12-21 DIAGNOSIS — N400 Enlarged prostate without lower urinary tract symptoms: Secondary | ICD-10-CM | POA: Diagnosis not present

## 2015-12-22 DIAGNOSIS — N186 End stage renal disease: Secondary | ICD-10-CM | POA: Diagnosis not present

## 2015-12-22 DIAGNOSIS — D631 Anemia in chronic kidney disease: Secondary | ICD-10-CM | POA: Diagnosis not present

## 2015-12-22 DIAGNOSIS — D689 Coagulation defect, unspecified: Secondary | ICD-10-CM | POA: Diagnosis not present

## 2015-12-22 DIAGNOSIS — N2581 Secondary hyperparathyroidism of renal origin: Secondary | ICD-10-CM | POA: Diagnosis not present

## 2015-12-22 DIAGNOSIS — D509 Iron deficiency anemia, unspecified: Secondary | ICD-10-CM | POA: Diagnosis not present

## 2015-12-24 DIAGNOSIS — N2581 Secondary hyperparathyroidism of renal origin: Secondary | ICD-10-CM | POA: Diagnosis not present

## 2015-12-24 DIAGNOSIS — N186 End stage renal disease: Secondary | ICD-10-CM | POA: Diagnosis not present

## 2015-12-24 DIAGNOSIS — D631 Anemia in chronic kidney disease: Secondary | ICD-10-CM | POA: Diagnosis not present

## 2015-12-24 DIAGNOSIS — D689 Coagulation defect, unspecified: Secondary | ICD-10-CM | POA: Diagnosis not present

## 2015-12-24 DIAGNOSIS — D509 Iron deficiency anemia, unspecified: Secondary | ICD-10-CM | POA: Diagnosis not present

## 2015-12-27 DIAGNOSIS — N186 End stage renal disease: Secondary | ICD-10-CM | POA: Diagnosis not present

## 2015-12-27 DIAGNOSIS — D689 Coagulation defect, unspecified: Secondary | ICD-10-CM | POA: Diagnosis not present

## 2015-12-27 DIAGNOSIS — N2581 Secondary hyperparathyroidism of renal origin: Secondary | ICD-10-CM | POA: Diagnosis not present

## 2015-12-27 DIAGNOSIS — D631 Anemia in chronic kidney disease: Secondary | ICD-10-CM | POA: Diagnosis not present

## 2015-12-27 DIAGNOSIS — D509 Iron deficiency anemia, unspecified: Secondary | ICD-10-CM | POA: Diagnosis not present

## 2015-12-28 DIAGNOSIS — Z992 Dependence on renal dialysis: Secondary | ICD-10-CM | POA: Diagnosis not present

## 2015-12-28 DIAGNOSIS — N400 Enlarged prostate without lower urinary tract symptoms: Secondary | ICD-10-CM | POA: Diagnosis not present

## 2015-12-28 DIAGNOSIS — J449 Chronic obstructive pulmonary disease, unspecified: Secondary | ICD-10-CM | POA: Diagnosis not present

## 2015-12-28 DIAGNOSIS — M1991 Primary osteoarthritis, unspecified site: Secondary | ICD-10-CM | POA: Diagnosis not present

## 2015-12-28 DIAGNOSIS — Z87440 Personal history of urinary (tract) infections: Secondary | ICD-10-CM | POA: Diagnosis not present

## 2015-12-28 DIAGNOSIS — Z48815 Encounter for surgical aftercare following surgery on the digestive system: Secondary | ICD-10-CM | POA: Diagnosis not present

## 2015-12-28 DIAGNOSIS — D649 Anemia, unspecified: Secondary | ICD-10-CM | POA: Diagnosis not present

## 2015-12-28 DIAGNOSIS — N186 End stage renal disease: Secondary | ICD-10-CM | POA: Diagnosis not present

## 2015-12-28 DIAGNOSIS — I120 Hypertensive chronic kidney disease with stage 5 chronic kidney disease or end stage renal disease: Secondary | ICD-10-CM | POA: Diagnosis not present

## 2015-12-29 DIAGNOSIS — D631 Anemia in chronic kidney disease: Secondary | ICD-10-CM | POA: Diagnosis not present

## 2015-12-29 DIAGNOSIS — N186 End stage renal disease: Secondary | ICD-10-CM | POA: Diagnosis not present

## 2015-12-29 DIAGNOSIS — D689 Coagulation defect, unspecified: Secondary | ICD-10-CM | POA: Diagnosis not present

## 2015-12-29 DIAGNOSIS — N2581 Secondary hyperparathyroidism of renal origin: Secondary | ICD-10-CM | POA: Diagnosis not present

## 2015-12-29 DIAGNOSIS — D509 Iron deficiency anemia, unspecified: Secondary | ICD-10-CM | POA: Diagnosis not present

## 2015-12-31 DIAGNOSIS — N2581 Secondary hyperparathyroidism of renal origin: Secondary | ICD-10-CM | POA: Diagnosis not present

## 2015-12-31 DIAGNOSIS — D631 Anemia in chronic kidney disease: Secondary | ICD-10-CM | POA: Diagnosis not present

## 2015-12-31 DIAGNOSIS — N186 End stage renal disease: Secondary | ICD-10-CM | POA: Diagnosis not present

## 2015-12-31 DIAGNOSIS — D509 Iron deficiency anemia, unspecified: Secondary | ICD-10-CM | POA: Diagnosis not present

## 2015-12-31 DIAGNOSIS — D689 Coagulation defect, unspecified: Secondary | ICD-10-CM | POA: Diagnosis not present

## 2016-01-03 DIAGNOSIS — D509 Iron deficiency anemia, unspecified: Secondary | ICD-10-CM | POA: Diagnosis not present

## 2016-01-03 DIAGNOSIS — D631 Anemia in chronic kidney disease: Secondary | ICD-10-CM | POA: Diagnosis not present

## 2016-01-03 DIAGNOSIS — N186 End stage renal disease: Secondary | ICD-10-CM | POA: Diagnosis not present

## 2016-01-03 DIAGNOSIS — N2581 Secondary hyperparathyroidism of renal origin: Secondary | ICD-10-CM | POA: Diagnosis not present

## 2016-01-03 DIAGNOSIS — D689 Coagulation defect, unspecified: Secondary | ICD-10-CM | POA: Diagnosis not present

## 2016-01-05 DIAGNOSIS — N186 End stage renal disease: Secondary | ICD-10-CM | POA: Diagnosis not present

## 2016-01-05 DIAGNOSIS — D631 Anemia in chronic kidney disease: Secondary | ICD-10-CM | POA: Diagnosis not present

## 2016-01-05 DIAGNOSIS — N2581 Secondary hyperparathyroidism of renal origin: Secondary | ICD-10-CM | POA: Diagnosis not present

## 2016-01-05 DIAGNOSIS — D509 Iron deficiency anemia, unspecified: Secondary | ICD-10-CM | POA: Diagnosis not present

## 2016-01-05 DIAGNOSIS — D689 Coagulation defect, unspecified: Secondary | ICD-10-CM | POA: Diagnosis not present

## 2016-01-07 DIAGNOSIS — D689 Coagulation defect, unspecified: Secondary | ICD-10-CM | POA: Diagnosis not present

## 2016-01-07 DIAGNOSIS — N2581 Secondary hyperparathyroidism of renal origin: Secondary | ICD-10-CM | POA: Diagnosis not present

## 2016-01-07 DIAGNOSIS — D649 Anemia, unspecified: Secondary | ICD-10-CM | POA: Diagnosis not present

## 2016-01-07 DIAGNOSIS — N400 Enlarged prostate without lower urinary tract symptoms: Secondary | ICD-10-CM | POA: Diagnosis not present

## 2016-01-07 DIAGNOSIS — Z992 Dependence on renal dialysis: Secondary | ICD-10-CM | POA: Diagnosis not present

## 2016-01-07 DIAGNOSIS — Z48815 Encounter for surgical aftercare following surgery on the digestive system: Secondary | ICD-10-CM | POA: Diagnosis not present

## 2016-01-07 DIAGNOSIS — M1991 Primary osteoarthritis, unspecified site: Secondary | ICD-10-CM | POA: Diagnosis not present

## 2016-01-07 DIAGNOSIS — D631 Anemia in chronic kidney disease: Secondary | ICD-10-CM | POA: Diagnosis not present

## 2016-01-07 DIAGNOSIS — Z87440 Personal history of urinary (tract) infections: Secondary | ICD-10-CM | POA: Diagnosis not present

## 2016-01-07 DIAGNOSIS — E859 Amyloidosis, unspecified: Secondary | ICD-10-CM | POA: Diagnosis not present

## 2016-01-07 DIAGNOSIS — D509 Iron deficiency anemia, unspecified: Secondary | ICD-10-CM | POA: Diagnosis not present

## 2016-01-07 DIAGNOSIS — J449 Chronic obstructive pulmonary disease, unspecified: Secondary | ICD-10-CM | POA: Diagnosis not present

## 2016-01-07 DIAGNOSIS — N186 End stage renal disease: Secondary | ICD-10-CM | POA: Diagnosis not present

## 2016-01-07 DIAGNOSIS — I120 Hypertensive chronic kidney disease with stage 5 chronic kidney disease or end stage renal disease: Secondary | ICD-10-CM | POA: Diagnosis not present

## 2016-01-10 DIAGNOSIS — N186 End stage renal disease: Secondary | ICD-10-CM | POA: Diagnosis not present

## 2016-01-10 DIAGNOSIS — D689 Coagulation defect, unspecified: Secondary | ICD-10-CM | POA: Diagnosis not present

## 2016-01-10 DIAGNOSIS — D631 Anemia in chronic kidney disease: Secondary | ICD-10-CM | POA: Diagnosis not present

## 2016-01-10 DIAGNOSIS — N2581 Secondary hyperparathyroidism of renal origin: Secondary | ICD-10-CM | POA: Diagnosis not present

## 2016-01-10 DIAGNOSIS — D509 Iron deficiency anemia, unspecified: Secondary | ICD-10-CM | POA: Diagnosis not present

## 2016-01-12 DIAGNOSIS — N2581 Secondary hyperparathyroidism of renal origin: Secondary | ICD-10-CM | POA: Diagnosis not present

## 2016-01-12 DIAGNOSIS — D689 Coagulation defect, unspecified: Secondary | ICD-10-CM | POA: Diagnosis not present

## 2016-01-12 DIAGNOSIS — N186 End stage renal disease: Secondary | ICD-10-CM | POA: Diagnosis not present

## 2016-01-12 DIAGNOSIS — D631 Anemia in chronic kidney disease: Secondary | ICD-10-CM | POA: Diagnosis not present

## 2016-01-12 DIAGNOSIS — D509 Iron deficiency anemia, unspecified: Secondary | ICD-10-CM | POA: Diagnosis not present

## 2016-01-14 DIAGNOSIS — D509 Iron deficiency anemia, unspecified: Secondary | ICD-10-CM | POA: Diagnosis not present

## 2016-01-14 DIAGNOSIS — D689 Coagulation defect, unspecified: Secondary | ICD-10-CM | POA: Diagnosis not present

## 2016-01-14 DIAGNOSIS — N186 End stage renal disease: Secondary | ICD-10-CM | POA: Diagnosis not present

## 2016-01-14 DIAGNOSIS — N2581 Secondary hyperparathyroidism of renal origin: Secondary | ICD-10-CM | POA: Diagnosis not present

## 2016-01-14 DIAGNOSIS — D631 Anemia in chronic kidney disease: Secondary | ICD-10-CM | POA: Diagnosis not present

## 2016-01-17 DIAGNOSIS — N2581 Secondary hyperparathyroidism of renal origin: Secondary | ICD-10-CM | POA: Diagnosis not present

## 2016-01-17 DIAGNOSIS — D689 Coagulation defect, unspecified: Secondary | ICD-10-CM | POA: Diagnosis not present

## 2016-01-17 DIAGNOSIS — D631 Anemia in chronic kidney disease: Secondary | ICD-10-CM | POA: Diagnosis not present

## 2016-01-17 DIAGNOSIS — D509 Iron deficiency anemia, unspecified: Secondary | ICD-10-CM | POA: Diagnosis not present

## 2016-01-17 DIAGNOSIS — N186 End stage renal disease: Secondary | ICD-10-CM | POA: Diagnosis not present

## 2016-01-19 DIAGNOSIS — N186 End stage renal disease: Secondary | ICD-10-CM | POA: Diagnosis not present

## 2016-01-19 DIAGNOSIS — D631 Anemia in chronic kidney disease: Secondary | ICD-10-CM | POA: Diagnosis not present

## 2016-01-19 DIAGNOSIS — D509 Iron deficiency anemia, unspecified: Secondary | ICD-10-CM | POA: Diagnosis not present

## 2016-01-19 DIAGNOSIS — N2581 Secondary hyperparathyroidism of renal origin: Secondary | ICD-10-CM | POA: Diagnosis not present

## 2016-01-19 DIAGNOSIS — D689 Coagulation defect, unspecified: Secondary | ICD-10-CM | POA: Diagnosis not present

## 2016-01-21 DIAGNOSIS — N2581 Secondary hyperparathyroidism of renal origin: Secondary | ICD-10-CM | POA: Diagnosis not present

## 2016-01-21 DIAGNOSIS — D631 Anemia in chronic kidney disease: Secondary | ICD-10-CM | POA: Diagnosis not present

## 2016-01-21 DIAGNOSIS — D689 Coagulation defect, unspecified: Secondary | ICD-10-CM | POA: Diagnosis not present

## 2016-01-21 DIAGNOSIS — N186 End stage renal disease: Secondary | ICD-10-CM | POA: Diagnosis not present

## 2016-01-21 DIAGNOSIS — D509 Iron deficiency anemia, unspecified: Secondary | ICD-10-CM | POA: Diagnosis not present

## 2016-01-24 DIAGNOSIS — D631 Anemia in chronic kidney disease: Secondary | ICD-10-CM | POA: Diagnosis not present

## 2016-01-24 DIAGNOSIS — D509 Iron deficiency anemia, unspecified: Secondary | ICD-10-CM | POA: Diagnosis not present

## 2016-01-24 DIAGNOSIS — D689 Coagulation defect, unspecified: Secondary | ICD-10-CM | POA: Diagnosis not present

## 2016-01-24 DIAGNOSIS — N186 End stage renal disease: Secondary | ICD-10-CM | POA: Diagnosis not present

## 2016-01-24 DIAGNOSIS — N2581 Secondary hyperparathyroidism of renal origin: Secondary | ICD-10-CM | POA: Diagnosis not present

## 2016-01-26 DIAGNOSIS — D689 Coagulation defect, unspecified: Secondary | ICD-10-CM | POA: Diagnosis not present

## 2016-01-26 DIAGNOSIS — D509 Iron deficiency anemia, unspecified: Secondary | ICD-10-CM | POA: Diagnosis not present

## 2016-01-26 DIAGNOSIS — D631 Anemia in chronic kidney disease: Secondary | ICD-10-CM | POA: Diagnosis not present

## 2016-01-26 DIAGNOSIS — N2581 Secondary hyperparathyroidism of renal origin: Secondary | ICD-10-CM | POA: Diagnosis not present

## 2016-01-26 DIAGNOSIS — N186 End stage renal disease: Secondary | ICD-10-CM | POA: Diagnosis not present

## 2016-01-28 DIAGNOSIS — D509 Iron deficiency anemia, unspecified: Secondary | ICD-10-CM | POA: Diagnosis not present

## 2016-01-28 DIAGNOSIS — D631 Anemia in chronic kidney disease: Secondary | ICD-10-CM | POA: Diagnosis not present

## 2016-01-28 DIAGNOSIS — D689 Coagulation defect, unspecified: Secondary | ICD-10-CM | POA: Diagnosis not present

## 2016-01-28 DIAGNOSIS — N186 End stage renal disease: Secondary | ICD-10-CM | POA: Diagnosis not present

## 2016-01-28 DIAGNOSIS — N2581 Secondary hyperparathyroidism of renal origin: Secondary | ICD-10-CM | POA: Diagnosis not present

## 2016-01-31 DIAGNOSIS — N186 End stage renal disease: Secondary | ICD-10-CM | POA: Diagnosis not present

## 2016-01-31 DIAGNOSIS — D509 Iron deficiency anemia, unspecified: Secondary | ICD-10-CM | POA: Diagnosis not present

## 2016-01-31 DIAGNOSIS — D689 Coagulation defect, unspecified: Secondary | ICD-10-CM | POA: Diagnosis not present

## 2016-01-31 DIAGNOSIS — D631 Anemia in chronic kidney disease: Secondary | ICD-10-CM | POA: Diagnosis not present

## 2016-01-31 DIAGNOSIS — N2581 Secondary hyperparathyroidism of renal origin: Secondary | ICD-10-CM | POA: Diagnosis not present

## 2016-02-02 DIAGNOSIS — D509 Iron deficiency anemia, unspecified: Secondary | ICD-10-CM | POA: Diagnosis not present

## 2016-02-02 DIAGNOSIS — N186 End stage renal disease: Secondary | ICD-10-CM | POA: Diagnosis not present

## 2016-02-02 DIAGNOSIS — N2581 Secondary hyperparathyroidism of renal origin: Secondary | ICD-10-CM | POA: Diagnosis not present

## 2016-02-02 DIAGNOSIS — D689 Coagulation defect, unspecified: Secondary | ICD-10-CM | POA: Diagnosis not present

## 2016-02-02 DIAGNOSIS — D631 Anemia in chronic kidney disease: Secondary | ICD-10-CM | POA: Diagnosis not present

## 2016-02-04 DIAGNOSIS — N2581 Secondary hyperparathyroidism of renal origin: Secondary | ICD-10-CM | POA: Diagnosis not present

## 2016-02-04 DIAGNOSIS — D689 Coagulation defect, unspecified: Secondary | ICD-10-CM | POA: Diagnosis not present

## 2016-02-04 DIAGNOSIS — D631 Anemia in chronic kidney disease: Secondary | ICD-10-CM | POA: Diagnosis not present

## 2016-02-04 DIAGNOSIS — D509 Iron deficiency anemia, unspecified: Secondary | ICD-10-CM | POA: Diagnosis not present

## 2016-02-04 DIAGNOSIS — N186 End stage renal disease: Secondary | ICD-10-CM | POA: Diagnosis not present

## 2016-02-06 DIAGNOSIS — N186 End stage renal disease: Secondary | ICD-10-CM | POA: Diagnosis not present

## 2016-02-06 DIAGNOSIS — E859 Amyloidosis, unspecified: Secondary | ICD-10-CM | POA: Diagnosis not present

## 2016-02-06 DIAGNOSIS — Z992 Dependence on renal dialysis: Secondary | ICD-10-CM | POA: Diagnosis not present

## 2016-02-07 DIAGNOSIS — D509 Iron deficiency anemia, unspecified: Secondary | ICD-10-CM | POA: Diagnosis not present

## 2016-02-07 DIAGNOSIS — N186 End stage renal disease: Secondary | ICD-10-CM | POA: Diagnosis not present

## 2016-02-07 DIAGNOSIS — D689 Coagulation defect, unspecified: Secondary | ICD-10-CM | POA: Diagnosis not present

## 2016-02-07 DIAGNOSIS — N2581 Secondary hyperparathyroidism of renal origin: Secondary | ICD-10-CM | POA: Diagnosis not present

## 2016-02-07 DIAGNOSIS — D631 Anemia in chronic kidney disease: Secondary | ICD-10-CM | POA: Diagnosis not present

## 2016-02-09 DIAGNOSIS — N2581 Secondary hyperparathyroidism of renal origin: Secondary | ICD-10-CM | POA: Diagnosis not present

## 2016-02-09 DIAGNOSIS — N186 End stage renal disease: Secondary | ICD-10-CM | POA: Diagnosis not present

## 2016-02-09 DIAGNOSIS — D631 Anemia in chronic kidney disease: Secondary | ICD-10-CM | POA: Diagnosis not present

## 2016-02-09 DIAGNOSIS — D509 Iron deficiency anemia, unspecified: Secondary | ICD-10-CM | POA: Diagnosis not present

## 2016-02-09 DIAGNOSIS — D689 Coagulation defect, unspecified: Secondary | ICD-10-CM | POA: Diagnosis not present

## 2016-02-11 DIAGNOSIS — D631 Anemia in chronic kidney disease: Secondary | ICD-10-CM | POA: Diagnosis not present

## 2016-02-11 DIAGNOSIS — N2581 Secondary hyperparathyroidism of renal origin: Secondary | ICD-10-CM | POA: Diagnosis not present

## 2016-02-11 DIAGNOSIS — D509 Iron deficiency anemia, unspecified: Secondary | ICD-10-CM | POA: Diagnosis not present

## 2016-02-11 DIAGNOSIS — N186 End stage renal disease: Secondary | ICD-10-CM | POA: Diagnosis not present

## 2016-02-11 DIAGNOSIS — D689 Coagulation defect, unspecified: Secondary | ICD-10-CM | POA: Diagnosis not present

## 2016-02-14 DIAGNOSIS — D689 Coagulation defect, unspecified: Secondary | ICD-10-CM | POA: Diagnosis not present

## 2016-02-14 DIAGNOSIS — D509 Iron deficiency anemia, unspecified: Secondary | ICD-10-CM | POA: Diagnosis not present

## 2016-02-14 DIAGNOSIS — N186 End stage renal disease: Secondary | ICD-10-CM | POA: Diagnosis not present

## 2016-02-14 DIAGNOSIS — D631 Anemia in chronic kidney disease: Secondary | ICD-10-CM | POA: Diagnosis not present

## 2016-02-14 DIAGNOSIS — N2581 Secondary hyperparathyroidism of renal origin: Secondary | ICD-10-CM | POA: Diagnosis not present

## 2016-02-16 DIAGNOSIS — D509 Iron deficiency anemia, unspecified: Secondary | ICD-10-CM | POA: Diagnosis not present

## 2016-02-16 DIAGNOSIS — D631 Anemia in chronic kidney disease: Secondary | ICD-10-CM | POA: Diagnosis not present

## 2016-02-16 DIAGNOSIS — N186 End stage renal disease: Secondary | ICD-10-CM | POA: Diagnosis not present

## 2016-02-16 DIAGNOSIS — D689 Coagulation defect, unspecified: Secondary | ICD-10-CM | POA: Diagnosis not present

## 2016-02-16 DIAGNOSIS — N2581 Secondary hyperparathyroidism of renal origin: Secondary | ICD-10-CM | POA: Diagnosis not present

## 2016-02-18 DIAGNOSIS — D689 Coagulation defect, unspecified: Secondary | ICD-10-CM | POA: Diagnosis not present

## 2016-02-18 DIAGNOSIS — D509 Iron deficiency anemia, unspecified: Secondary | ICD-10-CM | POA: Diagnosis not present

## 2016-02-18 DIAGNOSIS — D631 Anemia in chronic kidney disease: Secondary | ICD-10-CM | POA: Diagnosis not present

## 2016-02-18 DIAGNOSIS — N186 End stage renal disease: Secondary | ICD-10-CM | POA: Diagnosis not present

## 2016-02-18 DIAGNOSIS — N2581 Secondary hyperparathyroidism of renal origin: Secondary | ICD-10-CM | POA: Diagnosis not present

## 2016-02-21 DIAGNOSIS — D689 Coagulation defect, unspecified: Secondary | ICD-10-CM | POA: Diagnosis not present

## 2016-02-21 DIAGNOSIS — D631 Anemia in chronic kidney disease: Secondary | ICD-10-CM | POA: Diagnosis not present

## 2016-02-21 DIAGNOSIS — N186 End stage renal disease: Secondary | ICD-10-CM | POA: Diagnosis not present

## 2016-02-21 DIAGNOSIS — N2581 Secondary hyperparathyroidism of renal origin: Secondary | ICD-10-CM | POA: Diagnosis not present

## 2016-02-21 DIAGNOSIS — D509 Iron deficiency anemia, unspecified: Secondary | ICD-10-CM | POA: Diagnosis not present

## 2016-02-23 DIAGNOSIS — N2581 Secondary hyperparathyroidism of renal origin: Secondary | ICD-10-CM | POA: Diagnosis not present

## 2016-02-23 DIAGNOSIS — N186 End stage renal disease: Secondary | ICD-10-CM | POA: Diagnosis not present

## 2016-02-23 DIAGNOSIS — D509 Iron deficiency anemia, unspecified: Secondary | ICD-10-CM | POA: Diagnosis not present

## 2016-02-23 DIAGNOSIS — D631 Anemia in chronic kidney disease: Secondary | ICD-10-CM | POA: Diagnosis not present

## 2016-02-23 DIAGNOSIS — D689 Coagulation defect, unspecified: Secondary | ICD-10-CM | POA: Diagnosis not present

## 2016-02-25 DIAGNOSIS — D509 Iron deficiency anemia, unspecified: Secondary | ICD-10-CM | POA: Diagnosis not present

## 2016-02-25 DIAGNOSIS — N2581 Secondary hyperparathyroidism of renal origin: Secondary | ICD-10-CM | POA: Diagnosis not present

## 2016-02-25 DIAGNOSIS — D689 Coagulation defect, unspecified: Secondary | ICD-10-CM | POA: Diagnosis not present

## 2016-02-25 DIAGNOSIS — D631 Anemia in chronic kidney disease: Secondary | ICD-10-CM | POA: Diagnosis not present

## 2016-02-25 DIAGNOSIS — N186 End stage renal disease: Secondary | ICD-10-CM | POA: Diagnosis not present

## 2016-02-28 DIAGNOSIS — D509 Iron deficiency anemia, unspecified: Secondary | ICD-10-CM | POA: Diagnosis not present

## 2016-02-28 DIAGNOSIS — N186 End stage renal disease: Secondary | ICD-10-CM | POA: Diagnosis not present

## 2016-02-28 DIAGNOSIS — D631 Anemia in chronic kidney disease: Secondary | ICD-10-CM | POA: Diagnosis not present

## 2016-02-28 DIAGNOSIS — N2581 Secondary hyperparathyroidism of renal origin: Secondary | ICD-10-CM | POA: Diagnosis not present

## 2016-02-28 DIAGNOSIS — D689 Coagulation defect, unspecified: Secondary | ICD-10-CM | POA: Diagnosis not present

## 2016-03-01 DIAGNOSIS — D689 Coagulation defect, unspecified: Secondary | ICD-10-CM | POA: Diagnosis not present

## 2016-03-01 DIAGNOSIS — N2581 Secondary hyperparathyroidism of renal origin: Secondary | ICD-10-CM | POA: Diagnosis not present

## 2016-03-01 DIAGNOSIS — N186 End stage renal disease: Secondary | ICD-10-CM | POA: Diagnosis not present

## 2016-03-01 DIAGNOSIS — D631 Anemia in chronic kidney disease: Secondary | ICD-10-CM | POA: Diagnosis not present

## 2016-03-01 DIAGNOSIS — D509 Iron deficiency anemia, unspecified: Secondary | ICD-10-CM | POA: Diagnosis not present

## 2016-03-02 DIAGNOSIS — M545 Low back pain: Secondary | ICD-10-CM | POA: Diagnosis not present

## 2016-03-02 DIAGNOSIS — Z6820 Body mass index (BMI) 20.0-20.9, adult: Secondary | ICD-10-CM | POA: Diagnosis not present

## 2016-03-02 DIAGNOSIS — M25551 Pain in right hip: Secondary | ICD-10-CM | POA: Diagnosis not present

## 2016-03-02 DIAGNOSIS — M25561 Pain in right knee: Secondary | ICD-10-CM | POA: Diagnosis not present

## 2016-03-03 DIAGNOSIS — D631 Anemia in chronic kidney disease: Secondary | ICD-10-CM | POA: Diagnosis not present

## 2016-03-03 DIAGNOSIS — D509 Iron deficiency anemia, unspecified: Secondary | ICD-10-CM | POA: Diagnosis not present

## 2016-03-03 DIAGNOSIS — N2581 Secondary hyperparathyroidism of renal origin: Secondary | ICD-10-CM | POA: Diagnosis not present

## 2016-03-03 DIAGNOSIS — N186 End stage renal disease: Secondary | ICD-10-CM | POA: Diagnosis not present

## 2016-03-03 DIAGNOSIS — D689 Coagulation defect, unspecified: Secondary | ICD-10-CM | POA: Diagnosis not present

## 2016-03-06 DIAGNOSIS — D509 Iron deficiency anemia, unspecified: Secondary | ICD-10-CM | POA: Diagnosis not present

## 2016-03-06 DIAGNOSIS — N2581 Secondary hyperparathyroidism of renal origin: Secondary | ICD-10-CM | POA: Diagnosis not present

## 2016-03-06 DIAGNOSIS — D689 Coagulation defect, unspecified: Secondary | ICD-10-CM | POA: Diagnosis not present

## 2016-03-06 DIAGNOSIS — N186 End stage renal disease: Secondary | ICD-10-CM | POA: Diagnosis not present

## 2016-03-06 DIAGNOSIS — D631 Anemia in chronic kidney disease: Secondary | ICD-10-CM | POA: Diagnosis not present

## 2016-03-08 DIAGNOSIS — D509 Iron deficiency anemia, unspecified: Secondary | ICD-10-CM | POA: Diagnosis not present

## 2016-03-08 DIAGNOSIS — D631 Anemia in chronic kidney disease: Secondary | ICD-10-CM | POA: Diagnosis not present

## 2016-03-08 DIAGNOSIS — D689 Coagulation defect, unspecified: Secondary | ICD-10-CM | POA: Diagnosis not present

## 2016-03-08 DIAGNOSIS — E859 Amyloidosis, unspecified: Secondary | ICD-10-CM | POA: Diagnosis not present

## 2016-03-08 DIAGNOSIS — Z992 Dependence on renal dialysis: Secondary | ICD-10-CM | POA: Diagnosis not present

## 2016-03-08 DIAGNOSIS — N186 End stage renal disease: Secondary | ICD-10-CM | POA: Diagnosis not present

## 2016-03-08 DIAGNOSIS — N2581 Secondary hyperparathyroidism of renal origin: Secondary | ICD-10-CM | POA: Diagnosis not present

## 2016-03-10 DIAGNOSIS — D631 Anemia in chronic kidney disease: Secondary | ICD-10-CM | POA: Diagnosis not present

## 2016-03-10 DIAGNOSIS — D689 Coagulation defect, unspecified: Secondary | ICD-10-CM | POA: Diagnosis not present

## 2016-03-10 DIAGNOSIS — N2581 Secondary hyperparathyroidism of renal origin: Secondary | ICD-10-CM | POA: Diagnosis not present

## 2016-03-10 DIAGNOSIS — Z283 Underimmunization status: Secondary | ICD-10-CM | POA: Diagnosis not present

## 2016-03-10 DIAGNOSIS — N186 End stage renal disease: Secondary | ICD-10-CM | POA: Diagnosis not present

## 2016-03-13 DIAGNOSIS — Z283 Underimmunization status: Secondary | ICD-10-CM | POA: Diagnosis not present

## 2016-03-13 DIAGNOSIS — N2581 Secondary hyperparathyroidism of renal origin: Secondary | ICD-10-CM | POA: Diagnosis not present

## 2016-03-13 DIAGNOSIS — N186 End stage renal disease: Secondary | ICD-10-CM | POA: Diagnosis not present

## 2016-03-13 DIAGNOSIS — D689 Coagulation defect, unspecified: Secondary | ICD-10-CM | POA: Diagnosis not present

## 2016-03-13 DIAGNOSIS — D631 Anemia in chronic kidney disease: Secondary | ICD-10-CM | POA: Diagnosis not present

## 2016-03-15 DIAGNOSIS — N2581 Secondary hyperparathyroidism of renal origin: Secondary | ICD-10-CM | POA: Diagnosis not present

## 2016-03-15 DIAGNOSIS — Z283 Underimmunization status: Secondary | ICD-10-CM | POA: Diagnosis not present

## 2016-03-15 DIAGNOSIS — D689 Coagulation defect, unspecified: Secondary | ICD-10-CM | POA: Diagnosis not present

## 2016-03-15 DIAGNOSIS — D631 Anemia in chronic kidney disease: Secondary | ICD-10-CM | POA: Diagnosis not present

## 2016-03-15 DIAGNOSIS — N186 End stage renal disease: Secondary | ICD-10-CM | POA: Diagnosis not present

## 2016-03-17 DIAGNOSIS — N186 End stage renal disease: Secondary | ICD-10-CM | POA: Diagnosis not present

## 2016-03-17 DIAGNOSIS — Z283 Underimmunization status: Secondary | ICD-10-CM | POA: Diagnosis not present

## 2016-03-17 DIAGNOSIS — N2581 Secondary hyperparathyroidism of renal origin: Secondary | ICD-10-CM | POA: Diagnosis not present

## 2016-03-17 DIAGNOSIS — D631 Anemia in chronic kidney disease: Secondary | ICD-10-CM | POA: Diagnosis not present

## 2016-03-17 DIAGNOSIS — D689 Coagulation defect, unspecified: Secondary | ICD-10-CM | POA: Diagnosis not present

## 2016-03-20 DIAGNOSIS — D689 Coagulation defect, unspecified: Secondary | ICD-10-CM | POA: Diagnosis not present

## 2016-03-20 DIAGNOSIS — N186 End stage renal disease: Secondary | ICD-10-CM | POA: Diagnosis not present

## 2016-03-20 DIAGNOSIS — Z283 Underimmunization status: Secondary | ICD-10-CM | POA: Diagnosis not present

## 2016-03-20 DIAGNOSIS — D631 Anemia in chronic kidney disease: Secondary | ICD-10-CM | POA: Diagnosis not present

## 2016-03-20 DIAGNOSIS — N2581 Secondary hyperparathyroidism of renal origin: Secondary | ICD-10-CM | POA: Diagnosis not present

## 2016-03-22 DIAGNOSIS — D689 Coagulation defect, unspecified: Secondary | ICD-10-CM | POA: Diagnosis not present

## 2016-03-22 DIAGNOSIS — D631 Anemia in chronic kidney disease: Secondary | ICD-10-CM | POA: Diagnosis not present

## 2016-03-22 DIAGNOSIS — N2581 Secondary hyperparathyroidism of renal origin: Secondary | ICD-10-CM | POA: Diagnosis not present

## 2016-03-22 DIAGNOSIS — N186 End stage renal disease: Secondary | ICD-10-CM | POA: Diagnosis not present

## 2016-03-22 DIAGNOSIS — Z283 Underimmunization status: Secondary | ICD-10-CM | POA: Diagnosis not present

## 2016-03-24 DIAGNOSIS — D631 Anemia in chronic kidney disease: Secondary | ICD-10-CM | POA: Diagnosis not present

## 2016-03-24 DIAGNOSIS — Z283 Underimmunization status: Secondary | ICD-10-CM | POA: Diagnosis not present

## 2016-03-24 DIAGNOSIS — D689 Coagulation defect, unspecified: Secondary | ICD-10-CM | POA: Diagnosis not present

## 2016-03-24 DIAGNOSIS — N2581 Secondary hyperparathyroidism of renal origin: Secondary | ICD-10-CM | POA: Diagnosis not present

## 2016-03-24 DIAGNOSIS — N186 End stage renal disease: Secondary | ICD-10-CM | POA: Diagnosis not present

## 2016-03-27 DIAGNOSIS — N186 End stage renal disease: Secondary | ICD-10-CM | POA: Diagnosis not present

## 2016-03-27 DIAGNOSIS — Z283 Underimmunization status: Secondary | ICD-10-CM | POA: Diagnosis not present

## 2016-03-27 DIAGNOSIS — D631 Anemia in chronic kidney disease: Secondary | ICD-10-CM | POA: Diagnosis not present

## 2016-03-27 DIAGNOSIS — N2581 Secondary hyperparathyroidism of renal origin: Secondary | ICD-10-CM | POA: Diagnosis not present

## 2016-03-27 DIAGNOSIS — D689 Coagulation defect, unspecified: Secondary | ICD-10-CM | POA: Diagnosis not present

## 2016-03-29 DIAGNOSIS — D631 Anemia in chronic kidney disease: Secondary | ICD-10-CM | POA: Diagnosis not present

## 2016-03-29 DIAGNOSIS — N186 End stage renal disease: Secondary | ICD-10-CM | POA: Diagnosis not present

## 2016-03-29 DIAGNOSIS — N2581 Secondary hyperparathyroidism of renal origin: Secondary | ICD-10-CM | POA: Diagnosis not present

## 2016-03-29 DIAGNOSIS — D689 Coagulation defect, unspecified: Secondary | ICD-10-CM | POA: Diagnosis not present

## 2016-03-29 DIAGNOSIS — Z283 Underimmunization status: Secondary | ICD-10-CM | POA: Diagnosis not present

## 2016-03-31 DIAGNOSIS — D631 Anemia in chronic kidney disease: Secondary | ICD-10-CM | POA: Diagnosis not present

## 2016-03-31 DIAGNOSIS — D689 Coagulation defect, unspecified: Secondary | ICD-10-CM | POA: Diagnosis not present

## 2016-03-31 DIAGNOSIS — N186 End stage renal disease: Secondary | ICD-10-CM | POA: Diagnosis not present

## 2016-03-31 DIAGNOSIS — N2581 Secondary hyperparathyroidism of renal origin: Secondary | ICD-10-CM | POA: Diagnosis not present

## 2016-03-31 DIAGNOSIS — Z283 Underimmunization status: Secondary | ICD-10-CM | POA: Diagnosis not present

## 2016-04-03 DIAGNOSIS — D689 Coagulation defect, unspecified: Secondary | ICD-10-CM | POA: Diagnosis not present

## 2016-04-03 DIAGNOSIS — N186 End stage renal disease: Secondary | ICD-10-CM | POA: Diagnosis not present

## 2016-04-03 DIAGNOSIS — D631 Anemia in chronic kidney disease: Secondary | ICD-10-CM | POA: Diagnosis not present

## 2016-04-03 DIAGNOSIS — Z283 Underimmunization status: Secondary | ICD-10-CM | POA: Diagnosis not present

## 2016-04-03 DIAGNOSIS — N2581 Secondary hyperparathyroidism of renal origin: Secondary | ICD-10-CM | POA: Diagnosis not present

## 2016-04-05 DIAGNOSIS — D631 Anemia in chronic kidney disease: Secondary | ICD-10-CM | POA: Diagnosis not present

## 2016-04-05 DIAGNOSIS — N2581 Secondary hyperparathyroidism of renal origin: Secondary | ICD-10-CM | POA: Diagnosis not present

## 2016-04-05 DIAGNOSIS — N186 End stage renal disease: Secondary | ICD-10-CM | POA: Diagnosis not present

## 2016-04-05 DIAGNOSIS — D689 Coagulation defect, unspecified: Secondary | ICD-10-CM | POA: Diagnosis not present

## 2016-04-05 DIAGNOSIS — Z283 Underimmunization status: Secondary | ICD-10-CM | POA: Diagnosis not present

## 2016-04-07 DIAGNOSIS — I499 Cardiac arrhythmia, unspecified: Secondary | ICD-10-CM | POA: Diagnosis not present

## 2016-04-07 DIAGNOSIS — N2581 Secondary hyperparathyroidism of renal origin: Secondary | ICD-10-CM | POA: Diagnosis not present

## 2016-04-07 DIAGNOSIS — E859 Amyloidosis, unspecified: Secondary | ICD-10-CM | POA: Diagnosis not present

## 2016-04-07 DIAGNOSIS — Z283 Underimmunization status: Secondary | ICD-10-CM | POA: Diagnosis not present

## 2016-04-07 DIAGNOSIS — Z992 Dependence on renal dialysis: Secondary | ICD-10-CM | POA: Diagnosis not present

## 2016-04-07 DIAGNOSIS — D631 Anemia in chronic kidney disease: Secondary | ICD-10-CM | POA: Diagnosis not present

## 2016-04-07 DIAGNOSIS — D689 Coagulation defect, unspecified: Secondary | ICD-10-CM | POA: Diagnosis not present

## 2016-04-07 DIAGNOSIS — N186 End stage renal disease: Secondary | ICD-10-CM | POA: Diagnosis not present

## 2016-04-10 DIAGNOSIS — N186 End stage renal disease: Secondary | ICD-10-CM | POA: Diagnosis not present

## 2016-04-10 DIAGNOSIS — N2581 Secondary hyperparathyroidism of renal origin: Secondary | ICD-10-CM | POA: Diagnosis not present

## 2016-04-10 DIAGNOSIS — D689 Coagulation defect, unspecified: Secondary | ICD-10-CM | POA: Diagnosis not present

## 2016-04-12 DIAGNOSIS — N186 End stage renal disease: Secondary | ICD-10-CM | POA: Diagnosis not present

## 2016-04-12 DIAGNOSIS — D689 Coagulation defect, unspecified: Secondary | ICD-10-CM | POA: Diagnosis not present

## 2016-04-12 DIAGNOSIS — N2581 Secondary hyperparathyroidism of renal origin: Secondary | ICD-10-CM | POA: Diagnosis not present

## 2016-04-14 DIAGNOSIS — D689 Coagulation defect, unspecified: Secondary | ICD-10-CM | POA: Diagnosis not present

## 2016-04-14 DIAGNOSIS — N2581 Secondary hyperparathyroidism of renal origin: Secondary | ICD-10-CM | POA: Diagnosis not present

## 2016-04-14 DIAGNOSIS — N186 End stage renal disease: Secondary | ICD-10-CM | POA: Diagnosis not present

## 2016-04-17 DIAGNOSIS — N186 End stage renal disease: Secondary | ICD-10-CM | POA: Diagnosis not present

## 2016-04-17 DIAGNOSIS — N2581 Secondary hyperparathyroidism of renal origin: Secondary | ICD-10-CM | POA: Diagnosis not present

## 2016-04-17 DIAGNOSIS — D689 Coagulation defect, unspecified: Secondary | ICD-10-CM | POA: Diagnosis not present

## 2016-04-19 DIAGNOSIS — N2581 Secondary hyperparathyroidism of renal origin: Secondary | ICD-10-CM | POA: Diagnosis not present

## 2016-04-19 DIAGNOSIS — N186 End stage renal disease: Secondary | ICD-10-CM | POA: Diagnosis not present

## 2016-04-19 DIAGNOSIS — D689 Coagulation defect, unspecified: Secondary | ICD-10-CM | POA: Diagnosis not present

## 2016-04-21 DIAGNOSIS — N186 End stage renal disease: Secondary | ICD-10-CM | POA: Diagnosis not present

## 2016-04-21 DIAGNOSIS — N2581 Secondary hyperparathyroidism of renal origin: Secondary | ICD-10-CM | POA: Diagnosis not present

## 2016-04-21 DIAGNOSIS — D689 Coagulation defect, unspecified: Secondary | ICD-10-CM | POA: Diagnosis not present

## 2016-04-24 DIAGNOSIS — N2581 Secondary hyperparathyroidism of renal origin: Secondary | ICD-10-CM | POA: Diagnosis not present

## 2016-04-24 DIAGNOSIS — N186 End stage renal disease: Secondary | ICD-10-CM | POA: Diagnosis not present

## 2016-04-24 DIAGNOSIS — D689 Coagulation defect, unspecified: Secondary | ICD-10-CM | POA: Diagnosis not present

## 2016-04-26 DIAGNOSIS — D689 Coagulation defect, unspecified: Secondary | ICD-10-CM | POA: Diagnosis not present

## 2016-04-26 DIAGNOSIS — N186 End stage renal disease: Secondary | ICD-10-CM | POA: Diagnosis not present

## 2016-04-26 DIAGNOSIS — N2581 Secondary hyperparathyroidism of renal origin: Secondary | ICD-10-CM | POA: Diagnosis not present

## 2016-04-28 DIAGNOSIS — D689 Coagulation defect, unspecified: Secondary | ICD-10-CM | POA: Diagnosis not present

## 2016-04-28 DIAGNOSIS — N2581 Secondary hyperparathyroidism of renal origin: Secondary | ICD-10-CM | POA: Diagnosis not present

## 2016-04-28 DIAGNOSIS — N186 End stage renal disease: Secondary | ICD-10-CM | POA: Diagnosis not present

## 2016-05-01 DIAGNOSIS — N2581 Secondary hyperparathyroidism of renal origin: Secondary | ICD-10-CM | POA: Diagnosis not present

## 2016-05-01 DIAGNOSIS — N186 End stage renal disease: Secondary | ICD-10-CM | POA: Diagnosis not present

## 2016-05-01 DIAGNOSIS — D689 Coagulation defect, unspecified: Secondary | ICD-10-CM | POA: Diagnosis not present

## 2016-05-03 DIAGNOSIS — D689 Coagulation defect, unspecified: Secondary | ICD-10-CM | POA: Diagnosis not present

## 2016-05-03 DIAGNOSIS — N186 End stage renal disease: Secondary | ICD-10-CM | POA: Diagnosis not present

## 2016-05-03 DIAGNOSIS — N2581 Secondary hyperparathyroidism of renal origin: Secondary | ICD-10-CM | POA: Diagnosis not present

## 2016-05-05 DIAGNOSIS — N2581 Secondary hyperparathyroidism of renal origin: Secondary | ICD-10-CM | POA: Diagnosis not present

## 2016-05-05 DIAGNOSIS — N186 End stage renal disease: Secondary | ICD-10-CM | POA: Diagnosis not present

## 2016-05-05 DIAGNOSIS — D689 Coagulation defect, unspecified: Secondary | ICD-10-CM | POA: Diagnosis not present

## 2016-05-08 DIAGNOSIS — D689 Coagulation defect, unspecified: Secondary | ICD-10-CM | POA: Diagnosis not present

## 2016-05-08 DIAGNOSIS — N186 End stage renal disease: Secondary | ICD-10-CM | POA: Diagnosis not present

## 2016-05-08 DIAGNOSIS — E859 Amyloidosis, unspecified: Secondary | ICD-10-CM | POA: Diagnosis not present

## 2016-05-08 DIAGNOSIS — Z992 Dependence on renal dialysis: Secondary | ICD-10-CM | POA: Diagnosis not present

## 2016-05-08 DIAGNOSIS — N2581 Secondary hyperparathyroidism of renal origin: Secondary | ICD-10-CM | POA: Diagnosis not present

## 2016-05-10 DIAGNOSIS — N186 End stage renal disease: Secondary | ICD-10-CM | POA: Diagnosis not present

## 2016-05-10 DIAGNOSIS — D689 Coagulation defect, unspecified: Secondary | ICD-10-CM | POA: Diagnosis not present

## 2016-05-10 DIAGNOSIS — N2581 Secondary hyperparathyroidism of renal origin: Secondary | ICD-10-CM | POA: Diagnosis not present

## 2016-05-12 DIAGNOSIS — N2581 Secondary hyperparathyroidism of renal origin: Secondary | ICD-10-CM | POA: Diagnosis not present

## 2016-05-12 DIAGNOSIS — N186 End stage renal disease: Secondary | ICD-10-CM | POA: Diagnosis not present

## 2016-05-12 DIAGNOSIS — D689 Coagulation defect, unspecified: Secondary | ICD-10-CM | POA: Diagnosis not present

## 2016-05-15 DIAGNOSIS — N2581 Secondary hyperparathyroidism of renal origin: Secondary | ICD-10-CM | POA: Diagnosis not present

## 2016-05-15 DIAGNOSIS — D689 Coagulation defect, unspecified: Secondary | ICD-10-CM | POA: Diagnosis not present

## 2016-05-15 DIAGNOSIS — N186 End stage renal disease: Secondary | ICD-10-CM | POA: Diagnosis not present

## 2016-05-17 DIAGNOSIS — N186 End stage renal disease: Secondary | ICD-10-CM | POA: Diagnosis not present

## 2016-05-17 DIAGNOSIS — D689 Coagulation defect, unspecified: Secondary | ICD-10-CM | POA: Diagnosis not present

## 2016-05-17 DIAGNOSIS — N2581 Secondary hyperparathyroidism of renal origin: Secondary | ICD-10-CM | POA: Diagnosis not present

## 2016-05-19 DIAGNOSIS — N186 End stage renal disease: Secondary | ICD-10-CM | POA: Diagnosis not present

## 2016-05-19 DIAGNOSIS — N2581 Secondary hyperparathyroidism of renal origin: Secondary | ICD-10-CM | POA: Diagnosis not present

## 2016-05-19 DIAGNOSIS — D689 Coagulation defect, unspecified: Secondary | ICD-10-CM | POA: Diagnosis not present

## 2016-05-22 DIAGNOSIS — N2581 Secondary hyperparathyroidism of renal origin: Secondary | ICD-10-CM | POA: Diagnosis not present

## 2016-05-22 DIAGNOSIS — N186 End stage renal disease: Secondary | ICD-10-CM | POA: Diagnosis not present

## 2016-05-22 DIAGNOSIS — D689 Coagulation defect, unspecified: Secondary | ICD-10-CM | POA: Diagnosis not present

## 2016-05-24 DIAGNOSIS — N186 End stage renal disease: Secondary | ICD-10-CM | POA: Diagnosis not present

## 2016-05-24 DIAGNOSIS — D689 Coagulation defect, unspecified: Secondary | ICD-10-CM | POA: Diagnosis not present

## 2016-05-24 DIAGNOSIS — N2581 Secondary hyperparathyroidism of renal origin: Secondary | ICD-10-CM | POA: Diagnosis not present

## 2016-05-26 DIAGNOSIS — N2581 Secondary hyperparathyroidism of renal origin: Secondary | ICD-10-CM | POA: Diagnosis not present

## 2016-05-26 DIAGNOSIS — D689 Coagulation defect, unspecified: Secondary | ICD-10-CM | POA: Diagnosis not present

## 2016-05-26 DIAGNOSIS — N186 End stage renal disease: Secondary | ICD-10-CM | POA: Diagnosis not present

## 2016-05-29 DIAGNOSIS — D689 Coagulation defect, unspecified: Secondary | ICD-10-CM | POA: Diagnosis not present

## 2016-05-29 DIAGNOSIS — N186 End stage renal disease: Secondary | ICD-10-CM | POA: Diagnosis not present

## 2016-05-29 DIAGNOSIS — N2581 Secondary hyperparathyroidism of renal origin: Secondary | ICD-10-CM | POA: Diagnosis not present

## 2016-05-31 DIAGNOSIS — N186 End stage renal disease: Secondary | ICD-10-CM | POA: Diagnosis not present

## 2016-05-31 DIAGNOSIS — D689 Coagulation defect, unspecified: Secondary | ICD-10-CM | POA: Diagnosis not present

## 2016-05-31 DIAGNOSIS — N2581 Secondary hyperparathyroidism of renal origin: Secondary | ICD-10-CM | POA: Diagnosis not present

## 2016-06-02 DIAGNOSIS — N186 End stage renal disease: Secondary | ICD-10-CM | POA: Diagnosis not present

## 2016-06-02 DIAGNOSIS — N2581 Secondary hyperparathyroidism of renal origin: Secondary | ICD-10-CM | POA: Diagnosis not present

## 2016-06-02 DIAGNOSIS — D689 Coagulation defect, unspecified: Secondary | ICD-10-CM | POA: Diagnosis not present

## 2016-06-05 DIAGNOSIS — N2581 Secondary hyperparathyroidism of renal origin: Secondary | ICD-10-CM | POA: Diagnosis not present

## 2016-06-05 DIAGNOSIS — D689 Coagulation defect, unspecified: Secondary | ICD-10-CM | POA: Diagnosis not present

## 2016-06-05 DIAGNOSIS — N186 End stage renal disease: Secondary | ICD-10-CM | POA: Diagnosis not present

## 2016-06-07 DIAGNOSIS — N2581 Secondary hyperparathyroidism of renal origin: Secondary | ICD-10-CM | POA: Diagnosis not present

## 2016-06-07 DIAGNOSIS — N186 End stage renal disease: Secondary | ICD-10-CM | POA: Diagnosis not present

## 2016-06-07 DIAGNOSIS — D689 Coagulation defect, unspecified: Secondary | ICD-10-CM | POA: Diagnosis not present

## 2016-06-08 DIAGNOSIS — N186 End stage renal disease: Secondary | ICD-10-CM | POA: Diagnosis not present

## 2016-06-08 DIAGNOSIS — Z992 Dependence on renal dialysis: Secondary | ICD-10-CM | POA: Diagnosis not present

## 2016-06-08 DIAGNOSIS — E859 Amyloidosis, unspecified: Secondary | ICD-10-CM | POA: Diagnosis not present

## 2016-06-09 DIAGNOSIS — N186 End stage renal disease: Secondary | ICD-10-CM | POA: Diagnosis not present

## 2016-06-09 DIAGNOSIS — D689 Coagulation defect, unspecified: Secondary | ICD-10-CM | POA: Diagnosis not present

## 2016-06-09 DIAGNOSIS — N2581 Secondary hyperparathyroidism of renal origin: Secondary | ICD-10-CM | POA: Diagnosis not present

## 2016-06-09 DIAGNOSIS — D631 Anemia in chronic kidney disease: Secondary | ICD-10-CM | POA: Diagnosis not present

## 2016-06-12 DIAGNOSIS — N186 End stage renal disease: Secondary | ICD-10-CM | POA: Diagnosis not present

## 2016-06-12 DIAGNOSIS — D689 Coagulation defect, unspecified: Secondary | ICD-10-CM | POA: Diagnosis not present

## 2016-06-12 DIAGNOSIS — D631 Anemia in chronic kidney disease: Secondary | ICD-10-CM | POA: Diagnosis not present

## 2016-06-12 DIAGNOSIS — N2581 Secondary hyperparathyroidism of renal origin: Secondary | ICD-10-CM | POA: Diagnosis not present

## 2016-06-14 DIAGNOSIS — D689 Coagulation defect, unspecified: Secondary | ICD-10-CM | POA: Diagnosis not present

## 2016-06-14 DIAGNOSIS — N186 End stage renal disease: Secondary | ICD-10-CM | POA: Diagnosis not present

## 2016-06-14 DIAGNOSIS — D631 Anemia in chronic kidney disease: Secondary | ICD-10-CM | POA: Diagnosis not present

## 2016-06-14 DIAGNOSIS — N2581 Secondary hyperparathyroidism of renal origin: Secondary | ICD-10-CM | POA: Diagnosis not present

## 2016-06-16 DIAGNOSIS — N2581 Secondary hyperparathyroidism of renal origin: Secondary | ICD-10-CM | POA: Diagnosis not present

## 2016-06-16 DIAGNOSIS — N186 End stage renal disease: Secondary | ICD-10-CM | POA: Diagnosis not present

## 2016-06-16 DIAGNOSIS — D689 Coagulation defect, unspecified: Secondary | ICD-10-CM | POA: Diagnosis not present

## 2016-06-16 DIAGNOSIS — D631 Anemia in chronic kidney disease: Secondary | ICD-10-CM | POA: Diagnosis not present

## 2016-06-19 DIAGNOSIS — D689 Coagulation defect, unspecified: Secondary | ICD-10-CM | POA: Diagnosis not present

## 2016-06-19 DIAGNOSIS — N2581 Secondary hyperparathyroidism of renal origin: Secondary | ICD-10-CM | POA: Diagnosis not present

## 2016-06-19 DIAGNOSIS — N186 End stage renal disease: Secondary | ICD-10-CM | POA: Diagnosis not present

## 2016-06-19 DIAGNOSIS — D631 Anemia in chronic kidney disease: Secondary | ICD-10-CM | POA: Diagnosis not present

## 2016-06-21 DIAGNOSIS — N186 End stage renal disease: Secondary | ICD-10-CM | POA: Diagnosis not present

## 2016-06-21 DIAGNOSIS — N2581 Secondary hyperparathyroidism of renal origin: Secondary | ICD-10-CM | POA: Diagnosis not present

## 2016-06-21 DIAGNOSIS — D689 Coagulation defect, unspecified: Secondary | ICD-10-CM | POA: Diagnosis not present

## 2016-06-21 DIAGNOSIS — D631 Anemia in chronic kidney disease: Secondary | ICD-10-CM | POA: Diagnosis not present

## 2016-06-23 DIAGNOSIS — N186 End stage renal disease: Secondary | ICD-10-CM | POA: Diagnosis not present

## 2016-06-23 DIAGNOSIS — D689 Coagulation defect, unspecified: Secondary | ICD-10-CM | POA: Diagnosis not present

## 2016-06-23 DIAGNOSIS — N2581 Secondary hyperparathyroidism of renal origin: Secondary | ICD-10-CM | POA: Diagnosis not present

## 2016-06-23 DIAGNOSIS — D631 Anemia in chronic kidney disease: Secondary | ICD-10-CM | POA: Diagnosis not present

## 2016-06-26 DIAGNOSIS — D689 Coagulation defect, unspecified: Secondary | ICD-10-CM | POA: Diagnosis not present

## 2016-06-26 DIAGNOSIS — N186 End stage renal disease: Secondary | ICD-10-CM | POA: Diagnosis not present

## 2016-06-26 DIAGNOSIS — D631 Anemia in chronic kidney disease: Secondary | ICD-10-CM | POA: Diagnosis not present

## 2016-06-26 DIAGNOSIS — N2581 Secondary hyperparathyroidism of renal origin: Secondary | ICD-10-CM | POA: Diagnosis not present

## 2016-06-28 ENCOUNTER — Emergency Department (HOSPITAL_COMMUNITY): Payer: Medicare Other

## 2016-06-28 ENCOUNTER — Encounter (HOSPITAL_COMMUNITY): Payer: Self-pay | Admitting: *Deleted

## 2016-06-28 ENCOUNTER — Observation Stay (HOSPITAL_COMMUNITY)
Admission: EM | Admit: 2016-06-28 | Discharge: 2016-06-29 | Disposition: A | Discharge status: Home / Self Care | Payer: Medicare Other | Attending: Internal Medicine | Admitting: Internal Medicine

## 2016-06-28 DIAGNOSIS — Z992 Dependence on renal dialysis: Secondary | ICD-10-CM | POA: Diagnosis not present

## 2016-06-28 DIAGNOSIS — R55 Syncope and collapse: Secondary | ICD-10-CM | POA: Diagnosis not present

## 2016-06-28 DIAGNOSIS — I4891 Unspecified atrial fibrillation: Secondary | ICD-10-CM | POA: Diagnosis not present

## 2016-06-28 DIAGNOSIS — I4510 Unspecified right bundle-branch block: Secondary | ICD-10-CM | POA: Insufficient documentation

## 2016-06-28 DIAGNOSIS — I272 Other secondary pulmonary hypertension: Secondary | ICD-10-CM | POA: Insufficient documentation

## 2016-06-28 DIAGNOSIS — D689 Coagulation defect, unspecified: Secondary | ICD-10-CM | POA: Diagnosis not present

## 2016-06-28 DIAGNOSIS — F4329 Adjustment disorder with other symptoms: Secondary | ICD-10-CM

## 2016-06-28 DIAGNOSIS — Z7982 Long term (current) use of aspirin: Secondary | ICD-10-CM | POA: Insufficient documentation

## 2016-06-28 DIAGNOSIS — I480 Paroxysmal atrial fibrillation: Principal | ICD-10-CM | POA: Insufficient documentation

## 2016-06-28 DIAGNOSIS — N401 Enlarged prostate with lower urinary tract symptoms: Secondary | ICD-10-CM | POA: Diagnosis not present

## 2016-06-28 DIAGNOSIS — I120 Hypertensive chronic kidney disease with stage 5 chronic kidney disease or end stage renal disease: Secondary | ICD-10-CM | POA: Diagnosis not present

## 2016-06-28 DIAGNOSIS — I10 Essential (primary) hypertension: Secondary | ICD-10-CM | POA: Diagnosis present

## 2016-06-28 DIAGNOSIS — N2581 Secondary hyperparathyroidism of renal origin: Secondary | ICD-10-CM | POA: Diagnosis not present

## 2016-06-28 DIAGNOSIS — I491 Atrial premature depolarization: Secondary | ICD-10-CM | POA: Diagnosis not present

## 2016-06-28 DIAGNOSIS — E850 Non-neuropathic heredofamilial amyloidosis: Secondary | ICD-10-CM | POA: Diagnosis not present

## 2016-06-28 DIAGNOSIS — F17210 Nicotine dependence, cigarettes, uncomplicated: Secondary | ICD-10-CM | POA: Diagnosis not present

## 2016-06-28 DIAGNOSIS — Z720 Tobacco use: Secondary | ICD-10-CM | POA: Diagnosis present

## 2016-06-28 DIAGNOSIS — J449 Chronic obstructive pulmonary disease, unspecified: Secondary | ICD-10-CM | POA: Diagnosis not present

## 2016-06-28 DIAGNOSIS — R531 Weakness: Secondary | ICD-10-CM | POA: Diagnosis not present

## 2016-06-28 DIAGNOSIS — M1990 Unspecified osteoarthritis, unspecified site: Secondary | ICD-10-CM | POA: Insufficient documentation

## 2016-06-28 DIAGNOSIS — N186 End stage renal disease: Secondary | ICD-10-CM | POA: Diagnosis not present

## 2016-06-28 DIAGNOSIS — D631 Anemia in chronic kidney disease: Secondary | ICD-10-CM | POA: Diagnosis not present

## 2016-06-28 DIAGNOSIS — Q2546 Tortuous aortic arch: Secondary | ICD-10-CM | POA: Insufficient documentation

## 2016-06-28 DIAGNOSIS — R404 Transient alteration of awareness: Secondary | ICD-10-CM | POA: Diagnosis not present

## 2016-06-28 LAB — BASIC METABOLIC PANEL
ANION GAP: 12 (ref 5–15)
BUN: 11 mg/dL (ref 6–20)
CO2: 31 mmol/L (ref 22–32)
Calcium: 9.1 mg/dL (ref 8.9–10.3)
Chloride: 97 mmol/L — ABNORMAL LOW (ref 101–111)
Creatinine, Ser: 5.29 mg/dL — ABNORMAL HIGH (ref 0.61–1.24)
GFR, EST AFRICAN AMERICAN: 11 mL/min — AB (ref >60)
GFR, EST NON AFRICAN AMERICAN: 10 mL/min — AB (ref >60)
Glucose, Bld: 94 mg/dL (ref 65–99)
POTASSIUM: 3.1 mmol/L — AB (ref 3.5–5.1)
Sodium: 140 mmol/L (ref 135–145)

## 2016-06-28 LAB — CBC WITH DIFFERENTIAL/PLATELET
BASOS PCT: 1 %
Basophils Absolute: 0 10*3/uL (ref 0.0–0.1)
Eosinophils Absolute: 0.2 10*3/uL (ref 0.0–0.7)
Eosinophils Relative: 4 %
HEMATOCRIT: 34.2 % — AB (ref 39.0–52.0)
HEMOGLOBIN: 11 g/dL — AB (ref 13.0–17.0)
LYMPHS ABS: 1 10*3/uL (ref 0.7–4.0)
LYMPHS PCT: 16 %
MCH: 33.2 pg (ref 26.0–34.0)
MCHC: 32.2 g/dL (ref 30.0–36.0)
MCV: 103.3 fL — AB (ref 78.0–100.0)
MONO ABS: 0.8 10*3/uL (ref 0.1–1.0)
MONOS PCT: 13 %
NEUTROS ABS: 4 10*3/uL (ref 1.7–7.7)
Neutrophils Relative %: 66 %
Platelets: 168 10*3/uL (ref 150–400)
RBC: 3.31 MIL/uL — ABNORMAL LOW (ref 4.22–5.81)
RDW: 13.3 % (ref 11.5–15.5)
WBC: 6 10*3/uL (ref 4.0–10.5)

## 2016-06-28 LAB — I-STAT CHEM 8, ED
BUN: 12 mg/dL (ref 6–20)
CALCIUM ION: 1.02 mmol/L — AB (ref 1.15–1.40)
CHLORIDE: 94 mmol/L — AB (ref 101–111)
Creatinine, Ser: 5.4 mg/dL — ABNORMAL HIGH (ref 0.61–1.24)
Glucose, Bld: 89 mg/dL (ref 65–99)
HEMATOCRIT: 34 % — AB (ref 39.0–52.0)
Hemoglobin: 11.6 g/dL — ABNORMAL LOW (ref 13.0–17.0)
POTASSIUM: 3.1 mmol/L — AB (ref 3.5–5.1)
SODIUM: 140 mmol/L (ref 135–145)
TCO2: 31 mmol/L (ref 0–100)

## 2016-06-28 MED ORDER — HEPARIN BOLUS VIA INFUSION
3300.0000 [IU] | Freq: Once | INTRAVENOUS | Status: AC
  Administered 2016-06-28: 3300 [IU] via INTRAVENOUS
  Filled 2016-06-28: qty 3300

## 2016-06-28 MED ORDER — DILTIAZEM HCL-DEXTROSE 100-5 MG/100ML-% IV SOLN (PREMIX)
5.0000 mg/h | INTRAVENOUS | Status: DC
  Administered 2016-06-28: 5 mg/h via INTRAVENOUS
  Filled 2016-06-28 (×3): qty 100

## 2016-06-28 MED ORDER — METOPROLOL TARTRATE 5 MG/5ML IV SOLN
10.0000 mg | Freq: Once | INTRAVENOUS | Status: AC
  Administered 2016-06-28: 10 mg via INTRAVENOUS
  Filled 2016-06-28 (×2): qty 10

## 2016-06-28 MED ORDER — DILTIAZEM LOAD VIA INFUSION
20.0000 mg | Freq: Once | INTRAVENOUS | Status: AC
  Administered 2016-06-28: 20 mg via INTRAVENOUS
  Filled 2016-06-28: qty 20

## 2016-06-28 MED ORDER — SODIUM CHLORIDE 0.9 % IV BOLUS (SEPSIS)
1000.0000 mL | Freq: Once | INTRAVENOUS | Status: AC
  Administered 2016-06-28: 1000 mL via INTRAVENOUS

## 2016-06-28 MED ORDER — HEPARIN (PORCINE) IN NACL 100-0.45 UNIT/ML-% IJ SOLN
950.0000 [IU]/h | INTRAMUSCULAR | Status: DC
  Administered 2016-06-28: 950 [IU]/h via INTRAVENOUS
  Filled 2016-06-28: qty 250

## 2016-06-28 MED ORDER — POTASSIUM CHLORIDE CRYS ER 20 MEQ PO TBCR
40.0000 meq | EXTENDED_RELEASE_TABLET | Freq: Once | ORAL | Status: AC
  Administered 2016-06-29: 40 meq via ORAL
  Filled 2016-06-28: qty 2

## 2016-06-28 NOTE — ED Triage Notes (Signed)
[facility]. Patient states he was at dialysis and he had a syncopal episode towards the end of tx. Does not complain of visual changes now. No complaints of pain. No recent infections. Patient is a/ox4. Hx of esrd, htn, enlarged prostate. EMS v/s 98/60, afib rvr HR 130-150s, 90% on 2L. fsbs was 113.

## 2016-06-28 NOTE — Progress Notes (Signed)
ANTICOAGULATION CONSULT NOTE - Initial Consult  Pharmacy Consult for heparin Indication: atrial fibrillation  No Known Allergies  Patient Measurements: Height: 5\' 11"  (180.3 cm) Weight: 146 lb (66.2 kg) IBW/kg (Calculated) : 75.3 Heparin Dosing Weight: 66.2 kg   Vital Signs: BP: 102/75 (09/20 1845) Pulse Rate: 80 (09/20 1815)  Labs:  Recent Labs  06/28/16 1807 06/28/16 1820  HGB 11.0* 11.6*  HCT 34.2* 34.0*  PLT 168  --   CREATININE 5.29* 5.40*    Estimated Creatinine Clearance: 11.1 mL/min (by C-G formula based on SCr of 5.4 mg/dL (H)).   Medical History: Past Medical History:  Diagnosis Date  . 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 Arthritis    bursitis in hip  . Bilateral inguinal hernia (BIH)    right > left side  . 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 Reynolds Bowloldonato  . Elevated troponin 04/12/2015  . Full dentures   . Hypertension   . Tobacco abuse   . Urinary tract infection due to Enterococcus 08/03/2015  . Varicocele     Assessment: 75 yo male admitted with syncope. Found with new onset Afib with RVR, rates to 130s-150s. No oral anticoagulation PTA. Patient on dialysis outpatient - poor renal function noted. Hgb low, but stable for baseline. Platelets also within normal limits. No signs or symptoms of bleeding noted.   Goal of Therapy:  Heparin level 0.3-0.7 units/ml Monitor platelets by anticoagulation protocol: Yes   Plan:  Give 3300 units bolus x 1 Start heparin infusion at 950 units/hr Check anti-Xa level in 8 hours and daily while on heparin Continue to monitor H&H and platelets  York CeriseKatherine Cook, PharmD Pharmacy Resident  Pager [phone] 06/28/16 7:13 PM

## 2016-06-28 NOTE — ED Notes (Signed)
Called Pharmacy at this time to get Metoprolol. Stated that they would send one from Pharmacy. Unable to get medication out of [facility].

## 2016-06-28 NOTE — ED Provider Notes (Signed)
[facility]-EMERGENCY DEPT Provider Note   CSN: [id number] Arrival date & time: 06/28/16  1745     History   Chief Complaint Chief Complaint  Patient presents with  . Near Syncope    HPI Shane [name] is a 75 y.o. male.  75 yo M with a chief complaint of near-syncope. This happened mainly after dialysis. He has blood pressures that time were low for the patient. His symptoms have completely resolved at this point. He denies any vomiting or diarrhea denies any dark stools. Denies abdominal pain denies chest pain or shortness breath.   The history is provided by the patient.  Near Syncope  This is a new problem. The current episode started less than 1 hour ago. The problem occurs constantly. The problem has not changed since onset.Pertinent negatives include no chest pain, no abdominal pain, no headaches and no shortness of breath. Nothing aggravates the symptoms. Nothing relieves the symptoms. He has tried nothing for the symptoms. The treatment provided no relief.    Past Medical History:  Diagnosis Date  . 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 Arthritis    bursitis in hip  . Bilateral inguinal hernia (BIH)    right > left side  . 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 Reynolds Bowl  . Elevated troponin 04/12/2015  . Full dentures   . Hypertension   . Tobacco abuse   . Urinary tract infection due to Enterococcus 08/03/2015  . Varicocele     Patient Active Problem List   Diagnosis Date Noted  . Toxic metabolic encephalopathy 12/01/2015  . Bilateral inguinal hernia (BIH) s/p lap repair w mesh 11/25/2015 11/25/2015  . Hydrocele s/p partial rexction/unroofing 11/25/2015 11/25/2015  . 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 Pulmonary hypertension (HCC) 08/04/2015  . Essential hypertension   . Acute encephalopathy 04/12/2015  . CKD (chronic kidney  disease) stage V requiring chronic dialysis (HCC) 04/12/2015  . Anorexia 04/12/2015  . Amyloidosis (HCC) 04/12/2015  . Uremia 04/12/2015  . Tobacco abuse 02/09/2013  . Hypertension 02/09/2013    Past Surgical History:  Procedure Laterality Date  . AV FISTULA PLACEMENT, BRACHIOCEPHALIC  10/19/2010   LEFT  . AV FISTULA PLACEMENT, RADIOCEPHALIC  01/30/2011   Right w/ ligation of competing venous branch by Dr. Leonides Sake  . AV FISTULA PLACEMENT, RADIOCEPHALIC  09/13/2010   LEFT  . BASCILIC VEIN TRANSPOSITION Left 04/08/2015   Procedure: BASILIC VEIN TRANSPOSITION;  Surgeon: Pryor Ochoa, MD;  Location: [facility] OR;  Service: Vascular;  Laterality: Left;  . COLONOSCOPY    . INGUINAL HERNIA REPAIR Bilateral 11/25/2015   Procedure: LAPAROSCOPIC BILATERAL INGUINAL HERNIA REPAIR--EXCISION OF RIGHT HYDROCELE;  Surgeon: Karie Soda, MD;  Location: [facility] OR;  Service: General;  Laterality: Bilateral;  . INSERTION OF MESH Bilateral 11/25/2015   Procedure: INSERTION OF MESH;  Surgeon: Karie Soda, MD;  Location: [facility] OR;  Service: General;  Laterality: Bilateral;  . MULTIPLE TOOTH EXTRACTIONS         Home Medications    Prior to Admission medications   Medication Sig Start Date End Date Taking? Authorizing Provider  albuterol-ipratropium (COMBIVENT) 18-103 MCG/ACT inhaler Inhale 2 puffs into the lungs every 6 (six) hours as needed for wheezing. 02/11/13   Genelle Gather, MD  amLODipine (NORVASC) 10 MG tablet Take 1 tablet (10 mg total) by mouth daily. 02/12/13   Samara Deist  Rolanda Jay, MD  B Complex-C-Folic Acid (RENAL MULTIVITAMIN FORMULA PO) Take 500 mg by mouth daily.    Historical Provider, MD  calcium acetate (PHOSLO) 667 MG capsule Take 2,001 mg by mouth 3 (three) times daily with meals.     Historical Provider, MD  finasteride (PROSCAR) 5 MG tablet Take 5 mg by mouth daily.    Historical Provider, MD  metoprolol succinate (TOPROL-XL) 50 MG 24 hr tablet Take 50 mg by mouth daily. Take with or immediately  following a meal.    Historical Provider, MD  Nutritional Supplements (FEEDING SUPPLEMENT, NEPRO CARB STEADY,) LIQD Take 237 mLs by mouth 2 (two) times daily between meals. 12/01/15   Shanker Levora Dredge, MD  Tamsulosin HCl (FLOMAX) 0.4 MG CAPS Take 0.4 mg by mouth 2 (two) times daily.     Historical Provider, MD  traMADol (ULTRAM) 50 MG tablet Take 1-2 tablets (50-100 mg total) by mouth every 6 (six) hours as needed for moderate pain or severe pain. 11/25/15   Karie Soda, MD    Family History Family History  Problem Relation Age of Onset  . Asthma Mother   . Hypertension Mother   . Alcohol abuse Father   . Hypertension Father   . Hypertension Sister   . Diabetes Sister   . Heart disease Sister   . Hypertension Brother   . Peripheral vascular disease Brother   . Hypertension Daughter   . Heart disease Daughter     Social History Social History  Substance Use Topics  . Smoking status: Current Every Day Smoker    Packs/day: 0.50    Years: 50.00    Types: Cigarettes  . Smokeless tobacco: Never Used  . Alcohol use No     Allergies   Review of patient's allergies indicates no known allergies.   Review of Systems Review of Systems  Constitutional: Negative for chills and fever.  HENT: Negative for congestion and facial swelling.   Eyes: Negative for discharge and visual disturbance.  Respiratory: Negative for shortness of breath.   Cardiovascular: Positive for near-syncope. Negative for chest pain and palpitations.  Gastrointestinal: Negative for abdominal pain, diarrhea and vomiting.  Musculoskeletal: Negative for arthralgias and myalgias.  Skin: Negative for color change and rash.  Neurological: Positive for syncope (near). Negative for tremors and headaches.  Psychiatric/Behavioral: Negative for confusion and dysphoric mood.     Physical Exam Updated Vital Signs BP 105/68   Pulse 86   Resp (!) 29   Ht 5\' 11"  (1.803 m)   Wt 146 lb (66.2 kg)   SpO2 91%   BMI  20.36 kg/m   Physical Exam  Constitutional: He is oriented to person, place, and time. He appears cachectic.  HENT:  Head: Normocephalic and atraumatic.  Eyes: EOM are normal. Pupils are equal, round, and reactive to light.  Neck: Normal range of motion. Neck supple. No JVD present.  Cardiovascular: An irregularly irregular rhythm present. Tachycardia present.  Exam reveals no gallop and no friction rub.   No murmur heard. Pulmonary/Chest: No respiratory distress. He has no wheezes.  Abdominal: He exhibits no distension. There is no rebound and no guarding.  Musculoskeletal: Normal range of motion.  Neurological: He is alert and oriented to person, place, and time.  Skin: No rash noted. No pallor.  Psychiatric: He has a normal mood and affect. His behavior is normal.  Nursing note and vitals reviewed.    ED Treatments / Results  Labs (all labs ordered are listed, but only  abnormal results are displayed) Labs Reviewed  CBC WITH DIFFERENTIAL/PLATELET - Abnormal; Notable for the following:       Result Value   RBC 3.31 (*)    Hemoglobin 11.0 (*)    HCT 34.2 (*)    MCV 103.3 (*)    All other components within normal limits  BASIC METABOLIC PANEL - Abnormal; Notable for the following:    Potassium 3.1 (*)    Chloride 97 (*)    Creatinine, Ser 5.29 (*)    GFR calc non Af Amer 10 (*)    GFR calc Af Amer 11 (*)    All other components within normal limits  I-STAT CHEM 8, ED - Abnormal; Notable for the following:    Potassium 3.1 (*)    Chloride 94 (*)    Creatinine, Ser 5.40 (*)    Calcium, Ion 1.02 (*)    Hemoglobin 11.6 (*)    HCT 34.0 (*)    All other components within normal limits  HEPARIN LEVEL (UNFRACTIONATED)  CBC    EKG  EKG Interpretation  Date/Time:  Wednesday June 28 2016 17:57:28 EDT Ventricular Rate:  153 PR Interval:    QRS Duration: 130 QT Interval:  327 QTC Calculation: 522 R Axis:   64 Text Interpretation:  Wide-QRS tachycardia Right bundle  branch block afib w rvr Confirmed by Adela Lank MD, DANIEL [phone]) on 06/28/2016 6:24:15 PM       Radiology Dg Chest Port 1 View  Result Date: 06/28/2016 CLINICAL DATA:  Syncopal episode. EXAM: PORTABLE CHEST 1 VIEW COMPARISON:  11/27/2015 FINDINGS: The heart is mildly enlarged but stable. There is moderate tortuosity of the thoracic aorta along with calcifications. The pulmonary hila appear normal. Mild chronic bronchitic changes but no infiltrates, edema or effusions. Streaky basilar scarring changes. IMPRESSION: Mild cardiac enlargement and moderate tortuosity of the thoracic aorta. No acute pulmonary findings. Electronically Signed   By: Rudie Meyer M.D.   On: 06/28/2016 19:02    Procedures Procedures (including critical care time)  Medications Ordered in ED Medications  diltiazem (CARDIZEM) 1 mg/mL load via infusion 20 mg (20 mg Intravenous Bolus from Bag 06/28/16 1915)    And  diltiazem (CARDIZEM) 100 mg in dextrose 5% (1 mg/mL) infusion (0 mg/hr Intravenous Paused 06/28/16 2004)  heparin ADULT infusion 100 units/mL (25000 units/254mL sodium chloride 0.45%) (not administered)  heparin bolus via infusion 3,300 Units (not administered)  potassium chloride SA (K-DUR,KLOR-CON) CR tablet 40 mEq (not administered)  sodium chloride 0.9 % bolus 1,000 mL (1,000 mLs Intravenous New Bag/Given 06/28/16 1839)  metoprolol (LOPRESSOR) injection 10 mg (10 mg Intravenous Given 06/28/16 1855)     Initial Impression / Assessment and Plan / ED Course  I have reviewed the triage vital signs and the nursing notes.  Pertinent labs & imaging results that were available during my care of the patient were reviewed by me and considered in my medical decision making (see chart for details).  Clinical Course    75 yo M With a chief complaints of a near-syncopal event. This was immediately after dialysis. Patient was found to be in A. fib with RVR. Has no history of A. fib in the past. Unsure of the exact  onset is patient has no symptoms currently. We'll give the patient IV fluids beta blockers and reassess.  Patient continued to be in a heart rate of 140-160. The started on diltiazem drip with bolus. Had improvement of heart rate into the 100s. Started on a heparin drip.  Has this is new onset A. fib and do not know the onset will admit the patient.  CRITICAL CARE Performed by: Rae Roamaniel Patrick Terran Klinke   Total critical care time: 80 minutes  Critical care time was exclusive of separately billable procedures and treating other patients.  Critical care was necessary to treat or prevent imminent or life-threatening deterioration.  Critical care was time spent personally by me on the following activities: development of treatment plan with patient and/or surrogate as well as nursing, discussions with consultants, evaluation of patient's response to treatment, examination of patient, obtaining history from patient or surrogate, ordering and performing treatments and interventions, ordering and review of laboratory studies, ordering and review of radiographic studies, pulse oximetry and re-evaluation of patient's condition.   The patients results and plan were reviewed and discussed.   Any x-rays performed were independently reviewed by myself.   Differential diagnosis were considered with the presenting HPI.  Medications  diltiazem (CARDIZEM) 1 mg/mL load via infusion 20 mg (20 mg Intravenous Bolus from Bag 06/28/16 1915)    And  diltiazem (CARDIZEM) 100 mg in dextrose 5% 100mL (1 mg/mL) infusion (0 mg/hr Intravenous Paused 06/28/16 2004)  heparin ADULT infusion 100 units/mL (25000 units/24150mL sodium chloride 0.45%) (not administered)  heparin bolus via infusion 3,300 Units (not administered)  potassium chloride SA (K-DUR,KLOR-CON) CR tablet 40 mEq (not administered)  sodium chloride 0.9 % bolus 1,000 mL (1,000 mLs Intravenous New Bag/Given 06/28/16 1839)  metoprolol (LOPRESSOR) injection 10 mg (10  mg Intravenous Given 06/28/16 1855)    Vitals:   06/28/16 2000 06/28/16 2015 06/28/16 2030 06/28/16 2045  BP: (!) 89/64 95/68 111/75 105/68  Pulse: 114 82 102 86  Resp: 22 20 (!) 27 (!) 29  SpO2: (!) 87% (!) 83% (!) 89% 91%  Weight:      Height:        Final diagnoses:  Atrial fibrillation with rapid ventricular response (HCC)  Syncope and collapse    Admission/ observation were discussed with the admitting physician, patient and/or family and they are comfortable with the plan.    Final Clinical Impressions(s) / ED Diagnoses   Final diagnoses:  Atrial fibrillation with rapid ventricular response (HCC)  Syncope and collapse    New Prescriptions New Prescriptions   No medications on file     Melene PlanDan Amunique Neyra, DO 06/29/16 0000

## 2016-06-28 NOTE — ED Notes (Signed)
Patient denies pain and is resting comfortably.  

## 2016-06-28 NOTE — ED Notes (Signed)
Medication will not be restarted. Pt in NSR. MD gave verbal order.

## 2016-06-29 ENCOUNTER — Observation Stay (HOSPITAL_COMMUNITY): Payer: Medicare Other

## 2016-06-29 DIAGNOSIS — F17210 Nicotine dependence, cigarettes, uncomplicated: Secondary | ICD-10-CM

## 2016-06-29 DIAGNOSIS — Z825 Family history of asthma and other chronic lower respiratory diseases: Secondary | ICD-10-CM | POA: Diagnosis not present

## 2016-06-29 DIAGNOSIS — R7989 Other specified abnormal findings of blood chemistry: Secondary | ICD-10-CM

## 2016-06-29 DIAGNOSIS — Z833 Family history of diabetes mellitus: Secondary | ICD-10-CM | POA: Diagnosis not present

## 2016-06-29 DIAGNOSIS — F4329 Adjustment disorder with other symptoms: Secondary | ICD-10-CM

## 2016-06-29 DIAGNOSIS — I4891 Unspecified atrial fibrillation: Secondary | ICD-10-CM | POA: Diagnosis present

## 2016-06-29 DIAGNOSIS — Z811 Family history of alcohol abuse and dependence: Secondary | ICD-10-CM

## 2016-06-29 DIAGNOSIS — Z8249 Family history of ischemic heart disease and other diseases of the circulatory system: Secondary | ICD-10-CM | POA: Diagnosis not present

## 2016-06-29 DIAGNOSIS — I480 Paroxysmal atrial fibrillation: Secondary | ICD-10-CM | POA: Diagnosis not present

## 2016-06-29 LAB — CBC
HEMATOCRIT: 33.5 % — AB (ref 39.0–52.0)
Hemoglobin: 10.7 g/dL — ABNORMAL LOW (ref 13.0–17.0)
MCH: 33.3 pg (ref 26.0–34.0)
MCHC: 31.9 g/dL (ref 30.0–36.0)
MCV: 104.4 fL — ABNORMAL HIGH (ref 78.0–100.0)
PLATELETS: 166 10*3/uL (ref 150–400)
RBC: 3.21 MIL/uL — ABNORMAL LOW (ref 4.22–5.81)
RDW: 13.5 % (ref 11.5–15.5)
WBC: 5.6 10*3/uL (ref 4.0–10.5)

## 2016-06-29 LAB — COMPREHENSIVE METABOLIC PANEL
ALBUMIN: 3.2 g/dL — AB (ref 3.5–5.0)
ALK PHOS: 50 U/L (ref 38–126)
ALT: 6 U/L — AB (ref 17–63)
AST: 13 U/L — ABNORMAL LOW (ref 15–41)
Anion gap: 10 (ref 5–15)
BUN: 16 mg/dL (ref 6–20)
CALCIUM: 8.9 mg/dL (ref 8.9–10.3)
CHLORIDE: 99 mmol/L — AB (ref 101–111)
CO2: 31 mmol/L (ref 22–32)
CREATININE: 6.38 mg/dL — AB (ref 0.61–1.24)
GFR calc Af Amer: 9 mL/min — ABNORMAL LOW (ref >60)
GFR calc non Af Amer: 8 mL/min — ABNORMAL LOW (ref >60)
GLUCOSE: 113 mg/dL — AB (ref 65–99)
Potassium: 3.9 mmol/L (ref 3.5–5.1)
SODIUM: 140 mmol/L (ref 135–145)
Total Bilirubin: 0.7 mg/dL (ref 0.3–1.2)
Total Protein: 6.3 g/dL — ABNORMAL LOW (ref 6.5–8.1)

## 2016-06-29 LAB — HEPARIN LEVEL (UNFRACTIONATED): HEPARIN UNFRACTIONATED: 0.53 [IU]/mL (ref 0.30–0.70)

## 2016-06-29 LAB — ECHOCARDIOGRAM COMPLETE
HEIGHTINCHES: 71 in
Weight: 2244.8 oz

## 2016-06-29 LAB — TROPONIN I: TROPONIN I: 0.31 ng/mL — AB (ref <0.03)

## 2016-06-29 LAB — MAGNESIUM: MAGNESIUM: 2.2 mg/dL (ref 1.7–2.4)

## 2016-06-29 MED ORDER — METOPROLOL SUCCINATE ER 50 MG PO TB24
50.0000 mg | ORAL_TABLET | Freq: Every day | ORAL | Status: DC
  Administered 2016-06-29: 50 mg via ORAL
  Filled 2016-06-29: qty 1

## 2016-06-29 MED ORDER — SODIUM CHLORIDE 0.9% FLUSH
3.0000 mL | Freq: Two times a day (BID) | INTRAVENOUS | Status: DC
  Administered 2016-06-29: 3 mL via INTRAVENOUS

## 2016-06-29 MED ORDER — TRAMADOL HCL 50 MG PO TABS: 50.0000 mg | ORAL_TABLET | Freq: Four times a day (QID) | ORAL | Status: DC | PRN

## 2016-06-29 MED ORDER — DILTIAZEM HCL ER COATED BEADS 120 MG PO CP24: 120.0000 mg | ORAL_CAPSULE | Freq: Every day | ORAL | 0 refills | Status: DC

## 2016-06-29 MED ORDER — CALCIUM ACETATE (PHOS BINDER) 667 MG PO CAPS
2001.0000 mg | ORAL_CAPSULE | Freq: Three times a day (TID) | ORAL | Status: DC
  Administered 2016-06-29 (×2): 2001 mg via ORAL
  Filled 2016-06-29 (×3): qty 3

## 2016-06-29 MED ORDER — NEPRO/CARBSTEADY PO LIQD
237.0000 mL | Freq: Two times a day (BID) | ORAL | Status: DC
  Filled 2016-06-29 (×4): qty 237

## 2016-06-29 MED ORDER — FINASTERIDE 5 MG PO TABS
5.0000 mg | ORAL_TABLET | Freq: Every day | ORAL | Status: DC
  Administered 2016-06-29: 5 mg via ORAL
  Filled 2016-06-29: qty 1

## 2016-06-29 MED ORDER — IPRATROPIUM-ALBUTEROL 0.5-2.5 (3) MG/3ML IN SOLN: 3.0000 mL | Freq: Four times a day (QID) | RESPIRATORY_TRACT | Status: DC | PRN

## 2016-06-29 MED ORDER — TAMSULOSIN HCL 0.4 MG PO CAPS
0.4000 mg | ORAL_CAPSULE | Freq: Two times a day (BID) | ORAL | Status: DC
  Administered 2016-06-29: 0.4 mg via ORAL
  Filled 2016-06-29: qty 1

## 2016-06-29 MED ORDER — ASPIRIN EC 325 MG PO TBEC: 325.0000 mg | DELAYED_RELEASE_TABLET | Freq: Every day | ORAL | 0 refills | Status: DC

## 2016-06-29 NOTE — Progress Notes (Signed)
  Echocardiogram 2D Echocardiogram has been performed.  Shane [name], Shane [name] 06/29/2016, 11:35 AM

## 2016-06-29 NOTE — Consult Note (Signed)
[facility] Consult   Reason for Consult:  Capacity evalution Referring Physician:  Dr. Erlinda Hong Patient Identification: Shane [name] MRN:  [id number] Principal Diagnosis: Atrial fibrillation with RVR [facility]) Diagnosis:   Patient Active Problem List   Diagnosis Date Noted  . Atrial fibrillation with RVR ([place]) [I48.91] 06/29/2016  . Atrial fibrillation with rapid ventricular response ([place]) [I48.91] 06/28/2016  . Toxic metabolic encephalopathy [B86] 12/01/2015  . Bilateral inguinal hernia (BIH) s/p lap repair w mesh 11/25/2015 [K40.20] 11/25/2015  . Hydrocele s/p partial rexction/unroofing 11/25/2015 [N43.3] 11/25/2015  . Enlarged prostate with lower urinary tract symptoms (LUTS) [N40.1] 09/28/2015  . Pulmonary hypertension ([place]) [I27.2] 08/04/2015  . Essential hypertension [I10]   . Acute encephalopathy [G93.40] 04/12/2015  . CKD (chronic kidney disease) stage V requiring chronic dialysis ([place]) [N18.6, Z99.2] 04/12/2015  . Anorexia [R63.0] 04/12/2015  . Amyloidosis ([place]) [E85.9] 04/12/2015  . Uremia [N19] 04/12/2015  . Tobacco abuse [Z72.0] 02/09/2013  . Hypertension [I10] 02/09/2013    Total Time spent with patient: 45 minutes  Subjective:   Shane [name] is a 75 y.o. male patient admitted with syncopal episode.  HPI:  Shane [name] is a 75 y.o. male admitted to [facility] after having a near syncopal episode almost at the end of the hemodialysis and also another episode at home as per the patient. Patient seen, chart reviewed and case discussed with the Dr. Erlinda Hong for the face-to-face psychiatric consultation and evaluation of capacity to make his own medical conditions Patient appeared awake, alert, oriented to time place person and situation. Patient reported he has been feeling good since been admitted to the hospital without further syncopal episodes. Patient also reported a physician last evening cleared him medically and his wife requested to stay overnight for  observation. Patient reported he does not want stay in hospital any longer because of new physician came and recommending more treatment including blood tests etc. Patient has intact cognitions including orientation, concentration, memory, language functions. Patient has no known memory deficits and remember the names of all his children and grandchildren. Patient is willing to discuss anything other than talking about his health problems and keeping him in the hospital. Patient stated he knows he needs to follow-up with the dialysis 3 times a week or chronic renal failure and also follow with primary care physician and is willing to follow up with outpatient medication management. Patient has no history of mental illness or outpatient or inpatient treatments.   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 He denies chest pain, palpitations, dizziness, diaphoresis, pitting edema lower extremities, nausea, emesis, abdominal pain, diarrhea, constipation, melena or hematochezia. The patient was incidentally found to be on atrial fibrillation with RVR ranging from 130s to 150s. ED Course: Workup shows potassium of 3.1 mmol/liter, hemoglobin of 11.0 g/dL, creatinine 5.29 mg/dL. The patient was given metoprolol 10 mg IVP 1, started on a Cardizem infusion and heparin per pharmacy. He has converted to sinus rhythm.  Past Psychiatric History: patient denied history of psychiatric illness as a inpatient or outpatient at anytime.  Risk to Self: Is patient at risk for suicide?: No Risk to Others:   Prior Inpatient Therapy:   Prior Outpatient Therapy:    Past Medical History:  Past Medical History:  Diagnosis Date  . 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 Arthritis    bursitis in hip  . Bilateral inguinal hernia (BIH)    right > left side  . 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 Meredeth Ide  . Elevated troponin 04/12/2015  . Full dentures   . Hypertension   . Tobacco abuse   . Urinary tract infection due to Enterococcus 08/03/2015  . Varicocele     Past Surgical History:  Procedure Laterality Date  . AV FISTULA PLACEMENT, BRACHIOCEPHALIC  09/32/3557   LEFT  . AV FISTULA PLACEMENT, RADIOCEPHALIC  32/20/2542   Right w/ ligation of competing venous branch by Dr. Adele Barthel  . AV FISTULA PLACEMENT, RADIOCEPHALIC  70/62/3762   LEFT  . BASCILIC VEIN TRANSPOSITION Left 04/08/2015   Procedure: BASILIC VEIN TRANSPOSITION;  Surgeon: Mal Misty, MD;  Location: [place];  Service: Vascular;  Laterality: Left;  . COLONOSCOPY    . INGUINAL HERNIA REPAIR Bilateral 11/25/2015   Procedure: LAPAROSCOPIC BILATERAL INGUINAL HERNIA REPAIR--EXCISION OF RIGHT HYDROCELE;  Surgeon: Michael Boston, MD;  Location: [place];  Service: General;  Laterality: Bilateral;  . INSERTION OF MESH Bilateral 11/25/2015   Procedure: INSERTION OF MESH;  Surgeon: Michael Boston, MD;  Location: [place];  Service: General;  Laterality: Bilateral;  . MULTIPLE TOOTH EXTRACTIONS     Family History:  Family History  Problem Relation Age of Onset  . Asthma Mother   . Hypertension Mother   . Alcohol abuse Father   . Hypertension Father   . Hypertension Sister   . Diabetes Sister   . Heart disease Sister   . Hypertension Brother   . Peripheral vascular disease Brother   . Hypertension Daughter   . Heart disease Daughter    Family Psychiatric  History: patient has no known family history of mental illness Social History:  History  Alcohol Use No     History  Drug Use No    Social History   Social History  . Marital status: Married    Spouse name: N/A  . Number of children:  N/A  . Years of education: N/A   Social History Main Topics  . Smoking status: Current Every Day Smoker    Packs/day: 0.50    Years: 50.00    Types: Cigarettes  . Smokeless tobacco: Never Used  . Alcohol use No  . Drug use: No  . Sexual activity: Not Asked   Other Topics Concern  . None   Social History Narrative  . None   Additional Social History: Patient lives with his wife    Allergies:  No Known Allergies  Labs:  Results for orders placed or performed during the hospital encounter of 06/28/16 (from the past 48 hour(s))  CBC with Differential     Status: Abnormal   Collection Time: 06/28/16  6:07 PM  Result Value Ref Range   WBC 6.0 4.0 - 10.5 K/uL   RBC 3.31 (L) 4.22 - 5.81 MIL/uL   Hemoglobin 11.0 (L) 13.0 - 17.0 g/dL   HCT 34.2 (L) 39.0 - 52.0 %   MCV 103.3 (H) 78.0 - 100.0 fL   MCH 33.2 26.0 - 34.0 pg   MCHC 32.2 30.0 - 36.0 g/dL   RDW 13.3 11.5 - 15.5 %   Platelets 168 150 - 400 K/uL   Neutrophils Relative % 66 %   Neutro  Abs 4.0 1.7 - 7.7 K/uL   Lymphocytes Relative 16 %   Lymphs Abs 1.0 0.7 - 4.0 K/uL   Monocytes Relative 13 %   Monocytes Absolute 0.8 0.1 - 1.0 K/uL   Eosinophils Relative 4 %   Eosinophils Absolute 0.2 0.0 - 0.7 K/uL   Basophils Relative 1 %   Basophils Absolute 0.0 0.0 - 0.1 K/uL  Basic metabolic panel     Status: Abnormal   Collection Time: 06/28/16  6:07 PM  Result Value Ref Range   Sodium 140 135 - 145 mmol/L   Potassium 3.1 (L) 3.5 - 5.1 mmol/L   Chloride 97 (L) 101 - 111 mmol/L   CO2 31 22 - 32 mmol/L   Glucose, Bld 94 65 - 99 mg/dL   BUN 11 6 - 20 mg/dL   Creatinine, Ser 5.29 (H) 0.61 - 1.24 mg/dL   Calcium 9.1 8.9 - 10.3 mg/dL   GFR calc non Af Amer 10 (L) >60 mL/min   GFR calc Af Amer 11 (L) >60 mL/min    Comment: (NOTE) The eGFR has been calculated using the CKD EPI equation. This calculation has not been validated in all clinical situations. eGFR's persistently <60 mL/min signify possible Chronic Kidney Disease.     Anion gap 12 5 - 15  I-stat chem 8, ed     Status: Abnormal   Collection Time: 06/28/16  6:20 PM  Result Value Ref Range   Sodium 140 135 - 145 mmol/L   Potassium 3.1 (L) 3.5 - 5.1 mmol/L   Chloride 94 (L) 101 - 111 mmol/L   BUN 12 6 - 20 mg/dL   Creatinine, Ser 5.40 (H) 0.61 - 1.24 mg/dL   Glucose, Bld 89 65 - 99 mg/dL   Calcium, Ion 1.02 (L) 1.15 - 1.40 mmol/L   TCO2 31 0 - 100 mmol/L   Hemoglobin 11.6 (L) 13.0 - 17.0 g/dL   HCT 34.0 (L) 39.0 - 52.0 %  Heparin level (unfractionated)     Status: None   Collection Time: 06/29/16  3:26 AM  Result Value Ref Range   Heparin Unfractionated 0.53 0.30 - 0.70 IU/mL    Comment:        IF HEPARIN RESULTS ARE BELOW EXPECTED VALUES, AND PATIENT DOSAGE HAS BEEN CONFIRMED, SUGGEST FOLLOW UP TESTING OF ANTITHROMBIN III LEVELS.   CBC     Status: Abnormal   Collection Time: 06/29/16  3:26 AM  Result Value Ref Range   WBC 5.6 4.0 - 10.5 K/uL   RBC 3.21 (L) 4.22 - 5.81 MIL/uL   Hemoglobin 10.7 (L) 13.0 - 17.0 g/dL   HCT 33.5 (L) 39.0 - 52.0 %   MCV 104.4 (H) 78.0 - 100.0 fL   MCH 33.3 26.0 - 34.0 pg   MCHC 31.9 30.0 - 36.0 g/dL   RDW 13.5 11.5 - 15.5 %   Platelets 166 150 - 400 K/uL  Troponin I (q 6hr x 3)     Status: Abnormal   Collection Time: 06/29/16  3:26 AM  Result Value Ref Range   Troponin I 0.31 (HH) <0.03 ng/mL    Comment: CRITICAL RESULT CALLED TO, READ BACK BY AND VERIFIED WITH: Otelia Limes [id number] [place]   Magnesium     Status: None   Collection Time: 06/29/16  3:26 AM  Result Value Ref Range   Magnesium 2.2 1.7 - 2.4 mg/dL  Comprehensive metabolic panel     Status: Abnormal   Collection Time: 06/29/16  3:26 AM  Result Value Ref Range   Sodium 140 135 - 145 mmol/L   Potassium 3.9 3.5 - 5.1 mmol/L    Comment: DELTA CHECK NOTED   Chloride 99 (L) 101 - 111 mmol/L   CO2 31 22 - 32 mmol/L   Glucose, Bld 113 (H) 65 - 99 mg/dL   BUN 16 6 - 20 mg/dL   Creatinine, Ser 6.38 (H) 0.61 - 1.24 mg/dL   Calcium 8.9 8.9 -  10.3 mg/dL   Total Protein 6.3 (L) 6.5 - 8.1 g/dL   Albumin 3.2 (L) 3.5 - 5.0 g/dL   AST 13 (L) 15 - 41 U/L   ALT 6 (L) 17 - 63 U/L   Alkaline Phosphatase 50 38 - 126 U/L   Total Bilirubin 0.7 0.3 - 1.2 mg/dL   GFR calc non Af Amer 8 (L) >60 mL/min   GFR calc Af Amer 9 (L) >60 mL/min    Comment: (NOTE) The eGFR has been calculated using the CKD EPI equation. This calculation has not been validated in all clinical situations. eGFR's persistently <60 mL/min signify possible Chronic Kidney Disease.    Anion gap 10 5 - 15    Current Facility-Administered Medications  Medication Dose Route Frequency Provider Last Rate Last Dose  . calcium acetate (PHOSLO) capsule 2,001 mg  2,001 mg Oral TID WC Reubin Milan, MD   2,001 mg at 06/29/16 1139  . diltiazem (CARDIZEM) 100 mg in dextrose 5% 128m (1 mg/mL) infusion  5-15 mg/hr Intravenous Continuous DDeno Etienne DO 5 mL/hr at 06/29/16 0116 5 mg/hr at 06/29/16 0116  . feeding supplement (NEPRO CARB STEADY) liquid 237 mL  237 mL Oral BID BM DReubin Milan MD      . finasteride (PROSCAR) tablet 5 mg  5 mg Oral Daily DReubin Milan MD   5 mg at 06/29/16 1140  . heparin ADULT infusion 100 units/mL (25000 units/2550msodium chloride 0.45%)  950 Units/hr Intravenous Continuous KaHonor LohRPH 9.5 mL/hr at 06/28/16 2318 950 Units/hr at 06/28/16 2318  . ipratropium-albuterol (DUONEB) 0.5-2.5 (3) MG/3ML nebulizer solution 3 mL  3 mL Inhalation Q6H PRN DaReubin MilanMD      . metoprolol succinate (TOPROL-XL) 24 hr tablet 50 mg  50 mg Oral Daily DaReubin MilanMD   50 mg at 06/29/16 1139  . sodium chloride flush (NS) 0.9 % injection 3 mL  3 mL Intravenous Q12H DaReubin MilanMD   3 mL at 06/29/16 1141  . tamsulosin (FLOMAX) capsule 0.4 mg  0.4 mg Oral BID DaReubin MilanMD   0.4 mg at 06/29/16 1139  . traMADol (ULTRAM) tablet 50 mg  50 mg Oral Q6H PRN DaReubin MilanMD        Musculoskeletal: Strength & Muscle  Tone: within normal limits Gait & Station: normal Patient leans: N/A  Psychiatric Specialty Exam: Physical Exam as per history and physical  ROS denied nausea, vomiting, abdomen pain, chest pain and shortness of breath No Fever-chills, No Headache, No changes with Vision or hearing, reports vertigo No problems swallowing food or Liquids, No Chest pain, Cough or Shortness of Breath, No Abdominal pain, No Nausea or Vommitting, Bowel movements are regular, No Blood in stool or Urine, No dysuria, No new skin rashes or bruises, No new joints pains-aches,  No new weakness, tingling, numbness in any extremity, No recent weight gain or loss, No polyuria, polydypsia or polyphagia,   A full 10 point Review of Systems was  done, except as stated above, all other Review of Systems were negative.  Blood pressure (!) 154/78, pulse 62, temperature 98.2 F (36.8 C), temperature source Oral, resp. rate 20, height 5' 11"  (1.803 m), weight 63.6 kg (140 lb 4.8 oz), SpO2 100 %.Body mass index is 19.57 kg/m.  General Appearance: Casual  Eye Contact:  Good  Speech:  Clear and Coherent  Volume:  Normal  Mood:  Anxious  Affect:  Appropriate and Congruent  Thought Process:  Coherent and Goal Directed  Orientation:  Full (Time, Place, and Person)  Thought Content:  WDL  Suicidal Thoughts:  No  Homicidal Thoughts:  No  Memory:  Immediate;   Good Recent;   Good Remote;   Fair  Judgement:  Good  Insight:  Good  Psychomotor Activity:  Normal  Concentration:  Concentration: Good and Attention Span: Good  Recall:  Good  Fund of Knowledge:  Good  Language:  Good  Akathisia:  Negative  Handed:  Right  AIMS (if indicated):     Assets:  Communication Skills Desire for Improvement Financial Resources/Insurance Housing Intimacy Leisure Time Resilience Social Support Transportation  ADL's:  Intact  Cognition:  WNL  Sleep:        Treatment Plan Summary: Augie Vane is a 75 years old male  with chronic renal failure, on hemodialysis 3 times a week presented to the hospital for near syncopal episode. Patient has been followed by the cardiology. Psychiatric consultation requested for capacity as he is not falling directions and instructions. Patient has no history of mental illness or never been treated.  Based on my evaluation patient has intact cognition and ability to understand his current and chronic medical problems and required treatment, patient meets criteria for capacity to make his own medical decisions and living arrangements  Appreciate psychiatric consultation and we sign off as of today Please contact [phone] or [phone] if needs further assistance   Disposition: No evidence of imminent risk to self or others at present.   Patient does not meet criteria for psychiatric inpatient admission. Supportive therapy provided about ongoing stressors.  Ambrose Finland, MD 06/29/2016 1:54 PM

## 2016-06-29 NOTE — Progress Notes (Signed)
ANTICOAGULATION CONSULT NOTE - Follow-up Consult  Pharmacy Consult for heparin Indication: atrial fibrillation  No Known Allergies  Patient Measurements: Height: 5\' 11"  (180.3 cm) Weight: 140 lb 4.8 oz (63.6 kg) IBW/kg (Calculated) : 75.3 Heparin Dosing Weight: 66.2 kg   Vital Signs: Temp: 98.2 F (36.8 C) (09/21 0406) Temp Source: Oral (09/21 0406) BP: 116/79 (09/21 0406) Pulse Rate: 64 (09/21 0406)  Labs:  Recent Labs  06/28/16 1807 06/28/16 1820 06/29/16 0326  HGB 11.0* 11.6* 10.7*  HCT 34.2* 34.0* 33.5*  PLT 168  --  166  HEPARINUNFRC  --   --  0.53  CREATININE 5.29* 5.40*  --     Estimated Creatinine Clearance: 10.6 mL/min (by C-G formula based on SCr of 5.4 mg/dL (H)).   Assessment: 75 yo male admitted with syncope. Found with new onset Afib with RVR, rates to 130s-150s. Pt started on heparin. Heparin level therapeutic on 950 units/hr. Hgb low, but stable for baseline. Platelets also within normal limits.  Goal of Therapy:  Heparin level 0.3-0.7 units/ml Monitor platelets by anticoagulation protocol: Yes   Plan:  Continue heparin at 950 units/hr Will f/u 6hr confirmatory heparin level  Christoper Fabianaron Anndrea Mihelich, PharmD, BCPS Clinical pharmacist, pager [phone] 06/29/16 4:35 AM

## 2016-06-29 NOTE — Progress Notes (Signed)
CRITICAL VALUE ALERT  Critical value received:  Troponin 0.31  Date of notification:  06/29/2016  Time of notification:  0445  Critical value read back:Yes.    Nurse who received alert:  Idelle CrouchMaria M. Konrad DoloresLester, RN  MD notified (1st page):  Donnamarie PoagK. Kirby  Time of first page:  0451  MD notified (2nd page):  Time of second page:  Responding MD:  none  Time MD responded: n/a

## 2016-06-29 NOTE — H&P (Signed)
History and Physical    Shane [name] ZOX:[id number] DOB: [birth year] DOA: 06/28/2016  PCP: [name], MD   Patient coming from: Dialysis Center.  Chief Complaint: Near syncopal.  HPI: Shane [name] is a 75 y.o. 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 He denies chest pain, palpitations, dizziness, diaphoresis, pitting edema lower extremities, nausea, emesis, abdominal pain, diarrhea, constipation, melena or hematochezia. The patient was incidentally found to be on atrial fibrillation with RVR ranging from 130s to 150s.  ED Course: Workup shows potassium of 3.1 mmol/liter, hemoglobin of 11.0 g/dL, creatinine 8.11 mg/dL. The patient was given metoprolol 10 mg IVP 1, started on a Cardizem infusion and heparin per pharmacy. He has converted to sinus rhythm.  Review of Systems: As per HPI otherwise 10 point review of systems negative.    Past Medical History:  Diagnosis Date  . 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 Arthritis    bursitis in hip  . Bilateral inguinal hernia (BIH)    right > left side  . 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 Reynolds Bowl  . Elevated troponin 04/12/2015  . Full dentures   . Hypertension   . Tobacco abuse   . Urinary tract infection due to Enterococcus 08/03/2015  . Varicocele     Past Surgical History:  Procedure Laterality Date  . AV FISTULA PLACEMENT, BRACHIOCEPHALIC  10/19/2010   LEFT  . AV FISTULA PLACEMENT, RADIOCEPHALIC  01/30/2011   Right w/ ligation of competing venous branch by Dr. Leonides Sake  . AV FISTULA PLACEMENT, RADIOCEPHALIC  09/13/2010   LEFT  . BASCILIC VEIN TRANSPOSITION Left 04/08/2015    Procedure: BASILIC VEIN TRANSPOSITION;  Surgeon: Pryor Ochoa, MD;  Location: [facility] OR;  Service: Vascular;  Laterality: Left;  . COLONOSCOPY    . INGUINAL HERNIA REPAIR Bilateral 11/25/2015   Procedure: LAPAROSCOPIC BILATERAL INGUINAL HERNIA REPAIR--EXCISION OF RIGHT HYDROCELE;  Surgeon: Karie Soda, MD;  Location: [facility] OR;  Service: General;  Laterality: Bilateral;  . INSERTION OF MESH Bilateral 11/25/2015   Procedure: INSERTION OF MESH;  Surgeon: Karie Soda, MD;  Location: [facility] OR;  Service: General;  Laterality: Bilateral;  . MULTIPLE TOOTH EXTRACTIONS       reports that he has been smoking Cigarettes.  He has a 25.00 pack-year smoking history. He has never used smokeless tobacco. He reports that he does not drink alcohol or use drugs.  No Known Allergies  Family History  Problem Relation Age of Onset  . Asthma Mother   . Hypertension Mother   . Alcohol abuse Father   . Hypertension Father   . Hypertension Sister   . Diabetes Sister   . Heart disease Sister   . Hypertension Brother   . Peripheral vascular disease Brother   . Hypertension Daughter   . Heart disease Daughter      Prior to Admission medications   Medication Sig Start Date End Date Taking? Authorizing Provider  albuterol-ipratropium (COMBIVENT) 18-103 MCG/ACT inhaler Inhale 2 puffs into the lungs every 6 (six) hours as needed for wheezing. 02/11/13  Yes Genelle Gather, MD  amLODipine (NORVASC) 10 MG tablet Take 1 tablet (10 mg total) by mouth  daily. 02/12/13  Yes Genelle GatherKathryn F Glenn, MD  B Complex-C-Folic Acid (RENAL MULTIVITAMIN FORMULA PO) Take 500 mg by mouth daily.   Yes Historical Provider, MD  calcium acetate (PHOSLO) 667 MG capsule Take 2,001 mg by mouth 3 (three) times daily with meals.    Yes Historical Provider, MD  finasteride (PROSCAR) 5 MG tablet Take 5 mg by mouth daily.   Yes Historical Provider, MD  metoprolol succinate (TOPROL-XL) 50 MG 24 hr tablet Take 50 mg by mouth daily. Take with or immediately  following a meal.   Yes Historical Provider, MD  Nutritional Supplements (FEEDING SUPPLEMENT, NEPRO CARB STEADY,) LIQD Take 237 mLs by mouth 2 (two) times daily between meals. 12/01/15  Yes Shanker Levora DredgeM Ghimire, MD  Tamsulosin HCl (FLOMAX) 0.4 MG CAPS Take 0.4 mg by mouth 2 (two) times daily.    Yes Historical Provider, MD  traMADol (ULTRAM) 50 MG tablet Take 1-2 tablets (50-100 mg total) by mouth every 6 (six) hours as needed for moderate pain or severe pain. 11/25/15  Yes Karie SodaSteven Gross, MD    Physical Exam:  Constitutional: NAD, calm, comfortable Vitals:   06/28/16 2230 06/28/16 2245 06/28/16 2300 06/28/16 2315  BP: 101/67   114/65  Pulse: 76 71 62   Resp: 14 17 16    SpO2: 99% 100% 97%   Weight:      Height:       Eyes: PERRL, lids and conjunctivae normal ENMT: Mucous membranes are moist. Posterior pharynx clear of any exudate or lesions. Dentures.  Neck: normal, supple, no masses, no thyromegaly Respiratory: clear to auscultation bilaterally, no wheezing, no crackles. Normal respiratory effort. No accessory muscle use.  Cardiovascular: Regular rate and rhythm, no murmurs / rubs / gallops. No extremity edema. 2+ pedal pulses. No carotid bruits.  Abdomen: no tenderness, no masses palpated. No hepatosplenomegaly. Bowel sounds positive.  Musculoskeletal: no clubbing / cyanosis. No joint deformity upper and lower extremities. Good ROM, no contractures. Normal muscle tone.  Skin: Dryness and hyperkeratosis of skin, particularly on lower extremities. Neurologic: CN 2-12 grossly intact. Sensation intact, DTR normal. Strength 5/5 in all 4.  Psychiatric: Normal judgment and insight. Alert and oriented x 4. Normal mood.     Labs on Admission: I have personally reviewed following labs and imaging studies  CBC:  Recent Labs Lab 06/28/16 1807 06/28/16 1820  WBC 6.0  --   NEUTROABS 4.0  --   HGB 11.0* 11.6*  HCT 34.2* 34.0*  MCV 103.3*  --   PLT 168  --    Basic Metabolic Panel:  Recent  Labs Lab 06/28/16 1807 06/28/16 1820  NA 140 140  K 3.1* 3.1*  CL 97* 94*  CO2 31  --   GLUCOSE 94 89  BUN 11 12  CREATININE 5.29* 5.40*  CALCIUM 9.1  --    GFR: Estimated Creatinine Clearance: 11.1 mL/min (by C-G formula based on SCr of 5.4 mg/dL (H)). Liver Function Tests: No results for input(s): AST, ALT, ALKPHOS, BILITOT, PROT, ALBUMIN in the last 168 hours. No results for input(s): LIPASE, AMYLASE in the last 168 hours. No results for input(s): AMMONIA in the last 168 hours. Coagulation Profile: No results for input(s): INR, PROTIME in the last 168 hours. Cardiac Enzymes: No results for input(s): CKTOTAL, CKMB, CKMBINDEX, TROPONINI in the last 168 hours. BNP (last 3 results) No results for input(s): PROBNP in the last 8760 hours. HbA1C: No results for input(s): HGBA1C in the last 72 hours. CBG: No results for input(s): GLUCAP  in the last 168 hours. Lipid Profile: No results for input(s): CHOL, HDL, LDLCALC, TRIG, CHOLHDL, LDLDIRECT in the last 72 hours. Thyroid Function Tests: No results for input(s): TSH, T4TOTAL, FREET4, T3FREE, THYROIDAB in the last 72 hours. Anemia Panel: No results for input(s): VITAMINB12, FOLATE, FERRITIN, TIBC, IRON, RETICCTPCT in the last 72 hours. Urine analysis:    Component Value Date/Time   COLORURINE YELLOW 08/01/2015 1430   APPEARANCEUR TURBID (A) 08/01/2015 1430   LABSPEC 1.020 08/01/2015 1430   PHURINE 5.0 08/01/2015 1430   GLUCOSEU 100 (A) 08/01/2015 1430   HGBUR SMALL (A) 08/01/2015 1430   BILIRUBINUR NEGATIVE 08/01/2015 1430   KETONESUR NEGATIVE 08/01/2015 1430   PROTEINUR >300 (A) 08/01/2015 1430   UROBILINOGEN 0.2 08/01/2015 1430   NITRITE NEGATIVE 08/01/2015 1430   LEUKOCYTESUR MODERATE (A) 08/01/2015 1430    Radiological Exams on Admission: Dg Chest Port 1 View  Result Date: 06/28/2016 CLINICAL DATA:  Syncopal episode. EXAM: PORTABLE CHEST 1 VIEW COMPARISON:  11/27/2015 FINDINGS: The heart is mildly enlarged but  stable. There is moderate tortuosity of the thoracic aorta along with calcifications. The pulmonary hila appear normal. Mild chronic bronchitic changes but no infiltrates, edema or effusions. Streaky basilar scarring changes. IMPRESSION: Mild cardiac enlargement and moderate tortuosity of the thoracic aorta. No acute pulmonary findings. Electronically Signed   By: Rudie Meyer M.D.   On: 06/28/2016 19:02  Echocardiogram 04/14/2015 ------------------------------------------------------------------- LV EF: 60% -   65%  ------------------------------------------------------------------- History:   PMH:  Elevated troponin.  Risk factors:  Amyloidosis. Chronic kidney disease. Hypertension.  ------------------------------------------------------------------- Study Conclusions  - Left ventricle: The cavity size was normal. Systolic function was   normal. The estimated ejection fraction was in the range of 60%   to 65%. Wall motion was normal; there were no regional wall   motion abnormalities. Doppler parameters are consistent with   abnormal left ventricular relaxation (grade 1 diastolic   dysfunction). Doppler parameters are consistent with high   ventricular filling pressure. - Left atrium: The atrium was mildly dilated. - Right ventricle: The cavity size was mildly dilated. Wall   thickness was normal. Systolic function was mildly reduced. - Right atrium: The atrium was mildly dilated. - Pulmonary arteries: Systolic pressure was moderately increased.   PA peak pressure: 60 mm Hg (S).  Impressions:  - When compared to prior echocardiogram, EF is improved and   pulmonary pressures have increased.  EKG: Independently reviewed.  Vent. rate 153 BPM PR interval * ms QRS duration 130 ms QT/QTc 327/522 ms P-R-T axes * 64 55 Wide-QRS tachycardia Right bundle branch block Atrial fibrillation with RVR.  Vent. rate 65 BPM PR interval * ms QRS duration 133 ms QT/QTc 467/486  ms P-R-T axes 62 139 42 Sinus rhythm Atrial premature complexes RBBB and LPFB Assessment/Plan Principal Problem:   Atrial fibrillation with RVR (HCC) Admit to stepdown/observation. Continue Cardizem infusion. Continue heparin infusion per pharmacy. Hold amlodipine. Continue metoprolol succinate 50 mg by mouth daily. Check echocardiogram in a.m.  Active Problems:   Tobacco abuse Graciela Husbands nicotine replacement therapy.    Hypertension Continue metoprolol. Hold amlodipine. Monitor blood pressure periodically.    CKD (chronic kidney disease) stage V requiring chronic dialysis [facility]) Message left at the inpatient HD consult voicemail.    Pulmonary hypertension (HCC) Check echocardiogram.    Enlarged prostate with lower urinary tract symptoms (LUTS) Continue finasteride 5 mg by mouth daily. Continue Flomax by mouth twice a day.    DVT prophylaxis: Heparin infusion. Code Status: Full  code. Family Communication:  Disposition Plan: Admit for rate control and further evaluation. Consults called:  Admission status: Observation/telemetry   Bobette Mo MD [facility] Pager [phone].  If 7PM-7AM, please contact night-coverage [email or web address] Password [facility]  06/29/2016, 12:27 AM

## 2016-06-29 NOTE — Progress Notes (Signed)
ANTICOAGULATION CONSULT NOTE - Follow-up Consult  Pharmacy Consult:  Heparin Indication: atrial fibrillation  No Known Allergies  Patient Measurements: Height: 5\' 11"  (180.3 cm) Weight: 140 lb 4.8 oz (63.6 kg) IBW/kg (Calculated) : 75.3 Heparin Dosing Weight: 66.2 kg   Vital Signs: Temp: 98.2 F (36.8 C) (09/21 0406) Temp Source: Oral (09/21 0406) BP: 154/78 (09/21 1139) Pulse Rate: 62 (09/21 1139)  Labs:  Recent Labs  06/28/16 1807 06/28/16 1820 06/29/16 0326  HGB 11.0* 11.6* 10.7*  HCT 34.2* 34.0* 33.5*  PLT 168  --  166  HEPARINUNFRC  --   --  0.53  CREATININE 5.29* 5.40* 6.38*  TROPONINI  --   --  0.31*    Estimated Creatinine Clearance: 9 mL/min (by C-G formula based on SCr of 6.38 mg/dL (H)).   Assessment: 75 yo male admitted with syncope. Found with new-onset Afib with RVR and he was started on IV heparin.  Patient is refusing labs, making it difficult to assess therapy.  Goal of Therapy:  Heparin level 0.3-0.7 units/ml Monitor platelets by anticoagulation protocol: Yes    Plan:  - Continue heparin gtt at 950 units/hr - Daily heparin level and CBC - F/U long-term AC plan   Eily Louvier D. Laney Potashang, PharmD, BCPS Pager:  [phone] 06/29/2016, 1:07 PM

## 2016-06-29 NOTE — Discharge Summary (Addendum)
Discharge Summary  Shane [name]:[id number] DOB: 03/31/1941  PCP: [name], MD  Admit date: 06/28/2016 Discharge date: 06/29/2016  Time spent: >65mins, more than 50% time spent on coordination of care.   Recommendations for Outpatient Follow-up:  1. F/u with PMD within a week  for hospital discharge follow up, repeat cbc/bmp at follow up 2. F/u with cardiology for afib management 3. Continue dialysis 4. New medication cardizem CR 120mg  daily, asa 325 daily, d/c norvasc. Continue metoprolol. Patient refuse to take coumadin   Discharge Diagnoses:  Active Hospital Problems   Diagnosis Date Noted  . Atrial fibrillation with RVR (HCC) 06/29/2016  . 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 Pulmonary hypertension (HCC) 08/04/2015  . CKD (chronic kidney disease) stage V requiring chronic dialysis (HCC) 04/12/2015  . Hypertension 02/09/2013  . Tobacco abuse 02/09/2013    Resolved Hospital Problems   Diagnosis Date Noted Date Resolved  No resolved problems to display.    Discharge Condition: stable  Diet recommendation: heart healthy/renal diet  Filed Weights   06/28/16 1751 06/29/16 0138  Weight: 66.2 kg (146 lb) 63.6 kg (140 lb 4.8 oz)    History of present illness:  Chief Complaint: Near syncopal.  HPI: Shane [name] is a 75 y.o. 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 He denies chest pain, palpitations, dizziness, diaphoresis, pitting edema lower extremities, nausea, emesis, abdominal pain, diarrhea, constipation, melena or hematochezia. The patient was incidentally found to be on atrial fibrillation with RVR ranging from 130s to 150s.  ED Course: Workup shows potassium of 3.1  mmol/liter, hemoglobin of 11.0 g/dL, creatinine 8.11 mg/dL. The patient was given metoprolol 10 mg IVP 1, started on a Cardizem infusion and heparin per pharmacy. He has converted to sinus rhythm.  Hospital Course:  Principal Problem:   Atrial fibrillation with RVR (HCC) Active Problems:   Tobacco abuse   Hypertension   CKD (chronic kidney disease) stage V requiring chronic dialysis (HCC)   Pulmonary hypertension (HCC)   Enlarged prostate with lower urinary tract symptoms (LUTS)   Atrial fibrillation with RVR (HCC) Patient is started on Cardizem infusion and heparin infusion since admission, his heart rate converted to sinus rhythm, echo pending, cardiology consulted. He is Continued on metoprolol succinate 50 mg by mouth daily,  Patient is adamant about being discharged home , he does not want to listen to explanation of the risk and benefit of different care plans, he refused blood draw while on heparin drip. He does has capacity to make decision per psychiatry eval. I have discussed with patient 's son Safley at [phone] over the phone and explained to him the risk and benefit of coumadin vs high dose of asa, I am concerning about patient being compliance with coumadin and frequent INR monitoring,  plan to discharge patient home on asa 325 daily for now,  son Davidian understand if patient is able to compliant with coumadin , it has higher efficacy in terms of stroke prevention.  Family agrees to continue to help patient to engage in discussion about medication regimen for afib management and stroke risk prevention.  Patient is to follow up with pmd and cardiology and nephrology closely.      Hypertension He is discharged on cardizem,  Continue metoprolol. D/c amlodipine.    CKD (chronic kidney disease) stage V requiring chronic dialysis (HCC) HD MWF    Pulmonary hypertension (HCC)  echocardiogram result pending    Enlarged prostate with lower urinary tract symptoms  (LUTS) Continue finasteride 5 mg by mouth daily. Continue Flomax by mouth twice a day.  Tobacco abuse Graciela Husbands nicotine replacement therapy.  DVT prophylaxis while in the : Heparin infusion. Code Status: Full code. Family Communication: i have talked to patient's son Jensyn [phone]   Discharge Exam: BP (!) 154/78   Pulse 62   Temp 98.2 F (36.8 C) (Oral)   Resp 20   Ht 5\' 11"  (1.803 m)   Wt 63.6 kg (140 lb 4.8 oz)   SpO2 100%   BMI 19.57 kg/m   General: NAD, AAOx3 Cardiovascular: RRR Respiratory: CTABL  Discharge Instructions You were cared for by a hospitalist during your hospital stay. If you have any questions about your discharge medications or the care you received while you were in the hospital after you are discharged, you can call the unit and asked to speak with the hospitalist on call if the hospitalist that took care of you is not available. Once you are discharged, your primary care physician will handle any further medical issues. Please note that NO REFILLS for any discharge medications will be authorized once you are discharged, as it is imperative that you return to your primary care physician (or establish a relationship with a primary care physician if you do not have one) for your aftercare needs so that they can reassess your need for medications and monitor your lab values.  Discharge Instructions    Diet - low sodium heart healthy    Complete by:  As directed    Renal diet   Increase activity slowly    Complete by:  As directed        Medication List    STOP taking these medications   amLODipine 10 MG tablet Commonly known as:  NORVASC     TAKE these medications   albuterol-ipratropium 18-103 MCG/ACT inhaler Commonly known as:  COMBIVENT Inhale 2 puffs into the lungs every 6 (six) hours as needed for wheezing.   aspirin EC 325 MG tablet Take 1 tablet (325 mg total) by mouth daily. Your may choose to take baby aspirin (81mg ) three tabs  daily, instead the 325mg  tabs daily.   calcium acetate 667 MG capsule Commonly known as:  PHOSLO Take 2,001 mg by mouth 3 (three) times daily with meals.   diltiazem 120 MG 24 hr capsule Commonly known as:  CARDIZEM CD Take 1 capsule (120 mg total) by mouth daily.   feeding supplement (NEPRO CARB STEADY) Liqd Take 237 mLs by mouth 2 (two) times daily between meals.   finasteride 5 MG tablet Commonly known as:  PROSCAR Take 5 mg by mouth daily.   FLOMAX 0.4 MG Caps capsule Generic drug:  tamsulosin Take 0.4 mg by mouth 2 (two) times daily.   metoprolol succinate 50 MG 24 hr tablet Commonly known as:  TOPROL-XL Take 50 mg by mouth daily. Take with or immediately following a meal.   RENAL MULTIVITAMIN FORMULA PO Take 500 mg by mouth daily.   traMADol 50 MG tablet Commonly known as:  ULTRAM Take 1-2 tablets (50-100 mg total) by mouth every 6 (six) hours as needed for moderate pain or severe pain.      No Known Allergies Follow-up Information    HAMRICK,MAURA L, MD Follow up  in 1 week(s).   Specialty:  Family Medicine Why:  hospital discharge follow up, please check your heart rate and blood pressure daily and continue work with your doctor for medication adjustment. Contact information: [address] [place] [address] [phone]        Charlton HawsPeter Nishan, MD Follow up in 1 month(s).   Specialty:  Cardiology Why:  atrial fibrillation ,  please continue to work with your doctor and discuss stroke prevention regimens in the setting of atrial fibrillation.   Contact information: [address] [place] [address] [phone]            The results of significant diagnostics from this hospitalization (including imaging, microbiology, ancillary and laboratory) are listed below for reference.    Significant Diagnostic Studies: Dg Chest Port 1 View  Result Date: 06/28/2016 CLINICAL DATA:  Syncopal episode. EXAM: PORTABLE CHEST 1 VIEW  COMPARISON:  11/27/2015 FINDINGS: The heart is mildly enlarged but stable. There is moderate tortuosity of the thoracic aorta along with calcifications. The pulmonary hila appear normal. Mild chronic bronchitic changes but no infiltrates, edema or effusions. Streaky basilar scarring changes. IMPRESSION: Mild cardiac enlargement and moderate tortuosity of the thoracic aorta. No acute pulmonary findings. Electronically Signed   By: Rudie MeyerP.  Gallerani M.D.   On: 06/28/2016 19:02    Microbiology: No results found for this or any previous visit (from the past 240 hour(s)).   Labs: Basic Metabolic Panel:  Recent Labs Lab 06/28/16 1807 06/28/16 1820 06/29/16 0326  NA 140 140 140  K 3.1* 3.1* 3.9  CL 97* 94* 99*  CO2 31  --  31  GLUCOSE 94 89 113*  BUN 11 12 16   CREATININE 5.29* 5.40* 6.38*  CALCIUM 9.1  --  8.9  MG  --   --  2.2   Liver Function Tests:  Recent Labs Lab 06/29/16 0326  AST 13*  ALT 6*  ALKPHOS 50  BILITOT 0.7  PROT 6.3*  ALBUMIN 3.2*   No results for input(s): LIPASE, AMYLASE in the last 168 hours. No results for input(s): AMMONIA in the last 168 hours. CBC:  Recent Labs Lab 06/28/16 1807 06/28/16 1820 06/29/16 0326  WBC 6.0  --  5.6  NEUTROABS 4.0  --   --   HGB 11.0* 11.6* 10.7*  HCT 34.2* 34.0* 33.5*  MCV 103.3*  --  104.4*  PLT 168  --  166   Cardiac Enzymes:  Recent Labs Lab 06/29/16 0326  TROPONINI 0.31*   BNP: BNP (last 3 results)  Recent Labs  08/01/15 1321  BNP 2,177.6*    ProBNP (last 3 results) No results for input(s): PROBNP in the last 8760 hours.  CBG: No results for input(s): GLUCAP in the last 168 hours.     SignedAlbertine Grates:  Elonzo Sopp MD, PhD  [facility] 06/29/2016, 3:12 PM

## 2016-06-29 NOTE — Consult Note (Signed)
CARDIOLOGY CONSULT NOTE       Patient ID: Shane [name] MRN: [id number] DOB/AGE: 1940/11/20 75 y.o.  Admit date: 06/28/2016 Referring Physician:  Roda Shutters Primary Physician: [name], MD Primary Cardiologist:  New Reason for Consultation:  PAF  Principal Problem:   Atrial fibrillation with RVR (HCC) Active Problems:   Tobacco abuse   Hypertension   CKD (chronic kidney disease) stage V requiring chronic dialysis (HCC)   Pulmonary hypertension (HCC)   Enlarged prostate with lower urinary tract symptoms (LUTS)   HPI:  75 y.o. 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 I last saw him in 2014 at that  Time echo showed EF 65% with LVH Unable to have MRI to exclude amyloid heart disease due to CRF. No  History of CAD He was 4 hrs into his dialysis and apparently "passed out" He had no symptoms and currently Is not clear about why he is hear. Denies palpitations chest pain or dyspnea. No notes/ records available from Dialysis but ECG at [facility] with rapide afib rate 153  Converted with cardizem.  Not a NOAC candidate due to  CRF and talking to patient I doubt he will be compliant with coumadin. Explained issues of PAF and stroke prevention With patient.   This patients CHA2DS2-VASc Score and unadjusted Ischemic Stroke Rate (% per year) is equal to 3.2 % stroke rate/year from a score of 3  Above score calculated as 1 point each if present [CHF, HTN, DM, Vascular=MI/PAD/Aortic Plaque, Age if 65-74, or Male] Above score calculated as 2 points each if present [Age > 75, or Stroke/TIA/TE]               ROS All other systems reviewed and negative except as noted above  Past Medical History:  Diagnosis Date  . 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 Arthritis    bursitis in hip  . Bilateral inguinal hernia (BIH)    right > left side  . Chronic kidney disease    secondary to amyloidosis AL lambda  phenotype, HD MWF Dr. Reynolds Bowl  . Elevated troponin 04/12/2015  . Full dentures   . Hypertension   . Tobacco abuse   . Urinary tract infection due to Enterococcus 08/03/2015  . Varicocele     Family History  Problem Relation Age of Onset  . Asthma Mother   . Hypertension Mother   . Alcohol abuse Father   . Hypertension Father   . Hypertension Sister   . Diabetes Sister   . Heart disease Sister   . Hypertension Brother   . Peripheral vascular disease Brother   . Hypertension Daughter   . Heart disease Daughter     Social History   Social History  . Marital status: Married    Spouse name: N/A  . Number of children: N/A  . Years of education: N/A   Occupational History  . Not on file.   Social History Main Topics  . Smoking status: Current Every Day Smoker    Packs/day: 0.50    Years: 50.00    Types: Cigarettes  . Smokeless tobacco: Never Used  . Alcohol use No  . Drug use: No  . Sexual activity: Not on file   Other Topics Concern  . Not on file   Social History Narrative  . No narrative on file    Past Surgical History:  Procedure Laterality Date  . AV  FISTULA PLACEMENT, BRACHIOCEPHALIC  10/19/2010   LEFT  . AV FISTULA PLACEMENT, RADIOCEPHALIC  01/30/2011   Right w/ ligation of competing venous branch by Dr. Leonides Sake  . AV FISTULA PLACEMENT, RADIOCEPHALIC  09/13/2010   LEFT  . BASCILIC VEIN TRANSPOSITION Left 04/08/2015   Procedure: BASILIC VEIN TRANSPOSITION;  Surgeon: Pryor Ochoa, MD;  Location: [facility] OR;  Service: Vascular;  Laterality: Left;  . COLONOSCOPY    . INGUINAL HERNIA REPAIR Bilateral 11/25/2015   Procedure: LAPAROSCOPIC BILATERAL INGUINAL HERNIA REPAIR--EXCISION OF RIGHT HYDROCELE;  Surgeon: Karie Soda, MD;  Location: [facility] OR;  Service: General;  Laterality: Bilateral;  . INSERTION OF MESH Bilateral 11/25/2015   Procedure: INSERTION OF MESH;  Surgeon: Karie Soda, MD;  Location: [facility] OR;  Service: General;  Laterality: Bilateral;  . MULTIPLE  TOOTH EXTRACTIONS       . calcium acetate  2,001 mg Oral TID WC  . feeding supplement (NEPRO CARB STEADY)  237 mL Oral BID BM  . finasteride  5 mg Oral Daily  . metoprolol succinate  50 mg Oral Daily  . sodium chloride flush  3 mL Intravenous Q12H  . tamsulosin  0.4 mg Oral BID   . diltiazem (CARDIZEM) infusion 5 mg/hr (06/29/16 0116)  . heparin 950 Units/hr (06/28/16 2318)    Physical Exam: BP (!) 154/78   Pulse 62   Temp 98.2 F (36.8 C) (Oral)   Resp 20   Ht 5\' 11"  (1.803 m)   Wt 63.6 kg (140 lb 4.8 oz)   SpO2 100%   BMI 19.57 kg/m   Affect appropriate Chronically ill black male  HEENT: normal Neck supple with no adenopathy JVP normal no bruits no thyromegaly Lungs clear with no wheezing and good diaphragmatic motion Heart:  S1/S2 no murmur, no rub, gallop or click PMI normal Abdomen: benighn, BS positve, no tenderness, no AAA no bruit.  No HSM or HJR Fistula LUE with good thrill  No edema Neuro non-focal Skin warm and dry No muscular weakness  Labs:   Lab Results  Component Value Date   WBC 5.6 06/29/2016   HGB 10.7 (L) 06/29/2016   HCT 33.5 (L) 06/29/2016   MCV 104.4 (H) 06/29/2016   PLT 166 06/29/2016    Recent Labs Lab 06/29/16 0326  NA 140  K 3.9  CL 99*  CO2 31  BUN 16  CREATININE 6.38*  CALCIUM 8.9  PROT 6.3*  BILITOT 0.7  ALKPHOS 50  ALT 6*  AST 13*  GLUCOSE 113*   Lab Results  Component Value Date   TROPONINI 0.31 (HH) 06/29/2016   No results found for: CHOL No results found for: HDL No results found for: LDLCALC No results found for: TRIG No results found for: CHOLHDL No results found for: LDLDIRECT    Radiology: Dg Chest Port 1 View  Result Date: 06/28/2016 CLINICAL DATA:  Syncopal episode. EXAM: PORTABLE CHEST 1 VIEW COMPARISON:  11/27/2015 FINDINGS: The heart is mildly enlarged but stable. There is moderate tortuosity of the thoracic aorta along with calcifications. The pulmonary hila appear normal. Mild chronic  bronchitic changes but no infiltrates, edema or effusions. Streaky basilar scarring changes. IMPRESSION: Mild cardiac enlargement and moderate tortuosity of the thoracic aorta. No acute pulmonary findings. Electronically Signed   By: Rudie Meyer M.D.   On: 06/28/2016 19:02    EKG: afib RBBB rate 153    ASSESSMENT AND PLAN:  PAF:  Agree with stopping norvasc and changing to cardizem. Continue Toprol.  Would start  coumadin if patient Willing to take and be compliant INR can be followed at dialysis.   Elevated Troponin : no acute ST changes not significant in setting of CRF and no chest pain  F/U echo pending  CRF:  Fistula with good thrill f/u [place]   Signed: Charlton HawsPeter Allyanna Appleman 06/29/2016, 12:01 PM

## 2016-06-29 NOTE — Progress Notes (Signed)
Removed patients IV, d/c'd telemetry, CCMD was called, patient refused to go over discharge instructions, instructions were reviewed with patients daughter.  Minerva Endsiffany N Ixchel Duck

## 2016-06-30 DIAGNOSIS — N2581 Secondary hyperparathyroidism of renal origin: Secondary | ICD-10-CM | POA: Diagnosis not present

## 2016-06-30 DIAGNOSIS — D689 Coagulation defect, unspecified: Secondary | ICD-10-CM | POA: Diagnosis not present

## 2016-06-30 DIAGNOSIS — D631 Anemia in chronic kidney disease: Secondary | ICD-10-CM | POA: Diagnosis not present

## 2016-06-30 DIAGNOSIS — N186 End stage renal disease: Secondary | ICD-10-CM | POA: Diagnosis not present

## 2016-07-03 DIAGNOSIS — D631 Anemia in chronic kidney disease: Secondary | ICD-10-CM | POA: Diagnosis not present

## 2016-07-03 DIAGNOSIS — N186 End stage renal disease: Secondary | ICD-10-CM | POA: Diagnosis not present

## 2016-07-03 DIAGNOSIS — D689 Coagulation defect, unspecified: Secondary | ICD-10-CM | POA: Diagnosis not present

## 2016-07-03 DIAGNOSIS — N2581 Secondary hyperparathyroidism of renal origin: Secondary | ICD-10-CM | POA: Diagnosis not present

## 2016-07-05 DIAGNOSIS — D631 Anemia in chronic kidney disease: Secondary | ICD-10-CM | POA: Diagnosis not present

## 2016-07-05 DIAGNOSIS — N2581 Secondary hyperparathyroidism of renal origin: Secondary | ICD-10-CM | POA: Diagnosis not present

## 2016-07-05 DIAGNOSIS — N186 End stage renal disease: Secondary | ICD-10-CM | POA: Diagnosis not present

## 2016-07-05 DIAGNOSIS — D689 Coagulation defect, unspecified: Secondary | ICD-10-CM | POA: Diagnosis not present

## 2016-07-07 DIAGNOSIS — N186 End stage renal disease: Secondary | ICD-10-CM | POA: Diagnosis not present

## 2016-07-07 DIAGNOSIS — D631 Anemia in chronic kidney disease: Secondary | ICD-10-CM | POA: Diagnosis not present

## 2016-07-07 DIAGNOSIS — N2581 Secondary hyperparathyroidism of renal origin: Secondary | ICD-10-CM | POA: Diagnosis not present

## 2016-07-07 DIAGNOSIS — D689 Coagulation defect, unspecified: Secondary | ICD-10-CM | POA: Diagnosis not present

## 2016-07-08 DIAGNOSIS — E859 Amyloidosis, unspecified: Secondary | ICD-10-CM | POA: Diagnosis not present

## 2016-07-08 DIAGNOSIS — N186 End stage renal disease: Secondary | ICD-10-CM | POA: Diagnosis not present

## 2016-07-08 DIAGNOSIS — Z992 Dependence on renal dialysis: Secondary | ICD-10-CM | POA: Diagnosis not present

## 2016-07-10 DIAGNOSIS — D631 Anemia in chronic kidney disease: Secondary | ICD-10-CM | POA: Diagnosis not present

## 2016-07-10 DIAGNOSIS — D689 Coagulation defect, unspecified: Secondary | ICD-10-CM | POA: Diagnosis not present

## 2016-07-10 DIAGNOSIS — D509 Iron deficiency anemia, unspecified: Secondary | ICD-10-CM | POA: Diagnosis not present

## 2016-07-10 DIAGNOSIS — N2581 Secondary hyperparathyroidism of renal origin: Secondary | ICD-10-CM | POA: Diagnosis not present

## 2016-07-10 DIAGNOSIS — N186 End stage renal disease: Secondary | ICD-10-CM | POA: Diagnosis not present

## 2016-07-12 DIAGNOSIS — D689 Coagulation defect, unspecified: Secondary | ICD-10-CM | POA: Diagnosis not present

## 2016-07-12 DIAGNOSIS — D631 Anemia in chronic kidney disease: Secondary | ICD-10-CM | POA: Diagnosis not present

## 2016-07-12 DIAGNOSIS — D509 Iron deficiency anemia, unspecified: Secondary | ICD-10-CM | POA: Diagnosis not present

## 2016-07-12 DIAGNOSIS — N2581 Secondary hyperparathyroidism of renal origin: Secondary | ICD-10-CM | POA: Diagnosis not present

## 2016-07-12 DIAGNOSIS — N186 End stage renal disease: Secondary | ICD-10-CM | POA: Diagnosis not present

## 2016-07-14 DIAGNOSIS — N186 End stage renal disease: Secondary | ICD-10-CM | POA: Diagnosis not present

## 2016-07-14 DIAGNOSIS — D689 Coagulation defect, unspecified: Secondary | ICD-10-CM | POA: Diagnosis not present

## 2016-07-14 DIAGNOSIS — D631 Anemia in chronic kidney disease: Secondary | ICD-10-CM | POA: Diagnosis not present

## 2016-07-14 DIAGNOSIS — N2581 Secondary hyperparathyroidism of renal origin: Secondary | ICD-10-CM | POA: Diagnosis not present

## 2016-07-14 DIAGNOSIS — D509 Iron deficiency anemia, unspecified: Secondary | ICD-10-CM | POA: Diagnosis not present

## 2016-07-17 DIAGNOSIS — N186 End stage renal disease: Secondary | ICD-10-CM | POA: Diagnosis not present

## 2016-07-17 DIAGNOSIS — N2581 Secondary hyperparathyroidism of renal origin: Secondary | ICD-10-CM | POA: Diagnosis not present

## 2016-07-17 DIAGNOSIS — D631 Anemia in chronic kidney disease: Secondary | ICD-10-CM | POA: Diagnosis not present

## 2016-07-17 DIAGNOSIS — D689 Coagulation defect, unspecified: Secondary | ICD-10-CM | POA: Diagnosis not present

## 2016-07-17 DIAGNOSIS — D509 Iron deficiency anemia, unspecified: Secondary | ICD-10-CM | POA: Diagnosis not present

## 2016-07-19 DIAGNOSIS — N186 End stage renal disease: Secondary | ICD-10-CM | POA: Diagnosis not present

## 2016-07-19 DIAGNOSIS — D689 Coagulation defect, unspecified: Secondary | ICD-10-CM | POA: Diagnosis not present

## 2016-07-19 DIAGNOSIS — D631 Anemia in chronic kidney disease: Secondary | ICD-10-CM | POA: Diagnosis not present

## 2016-07-19 DIAGNOSIS — D509 Iron deficiency anemia, unspecified: Secondary | ICD-10-CM | POA: Diagnosis not present

## 2016-07-19 DIAGNOSIS — N2581 Secondary hyperparathyroidism of renal origin: Secondary | ICD-10-CM | POA: Diagnosis not present

## 2016-07-21 DIAGNOSIS — N186 End stage renal disease: Secondary | ICD-10-CM | POA: Diagnosis not present

## 2016-07-21 DIAGNOSIS — N2581 Secondary hyperparathyroidism of renal origin: Secondary | ICD-10-CM | POA: Diagnosis not present

## 2016-07-21 DIAGNOSIS — D689 Coagulation defect, unspecified: Secondary | ICD-10-CM | POA: Diagnosis not present

## 2016-07-21 DIAGNOSIS — D509 Iron deficiency anemia, unspecified: Secondary | ICD-10-CM | POA: Diagnosis not present

## 2016-07-21 DIAGNOSIS — D631 Anemia in chronic kidney disease: Secondary | ICD-10-CM | POA: Diagnosis not present

## 2016-07-24 DIAGNOSIS — D509 Iron deficiency anemia, unspecified: Secondary | ICD-10-CM | POA: Diagnosis not present

## 2016-07-24 DIAGNOSIS — D689 Coagulation defect, unspecified: Secondary | ICD-10-CM | POA: Diagnosis not present

## 2016-07-24 DIAGNOSIS — N186 End stage renal disease: Secondary | ICD-10-CM | POA: Diagnosis not present

## 2016-07-24 DIAGNOSIS — N2581 Secondary hyperparathyroidism of renal origin: Secondary | ICD-10-CM | POA: Diagnosis not present

## 2016-07-24 DIAGNOSIS — D631 Anemia in chronic kidney disease: Secondary | ICD-10-CM | POA: Diagnosis not present

## 2016-07-26 DIAGNOSIS — N186 End stage renal disease: Secondary | ICD-10-CM | POA: Diagnosis not present

## 2016-07-26 DIAGNOSIS — N2581 Secondary hyperparathyroidism of renal origin: Secondary | ICD-10-CM | POA: Diagnosis not present

## 2016-07-26 DIAGNOSIS — D509 Iron deficiency anemia, unspecified: Secondary | ICD-10-CM | POA: Diagnosis not present

## 2016-07-26 DIAGNOSIS — D631 Anemia in chronic kidney disease: Secondary | ICD-10-CM | POA: Diagnosis not present

## 2016-07-26 DIAGNOSIS — D689 Coagulation defect, unspecified: Secondary | ICD-10-CM | POA: Diagnosis not present

## 2016-07-28 DIAGNOSIS — D509 Iron deficiency anemia, unspecified: Secondary | ICD-10-CM | POA: Diagnosis not present

## 2016-07-28 DIAGNOSIS — N2581 Secondary hyperparathyroidism of renal origin: Secondary | ICD-10-CM | POA: Diagnosis not present

## 2016-07-28 DIAGNOSIS — N186 End stage renal disease: Secondary | ICD-10-CM | POA: Diagnosis not present

## 2016-07-28 DIAGNOSIS — D631 Anemia in chronic kidney disease: Secondary | ICD-10-CM | POA: Diagnosis not present

## 2016-07-28 DIAGNOSIS — D689 Coagulation defect, unspecified: Secondary | ICD-10-CM | POA: Diagnosis not present

## 2016-07-31 DIAGNOSIS — D689 Coagulation defect, unspecified: Secondary | ICD-10-CM | POA: Diagnosis not present

## 2016-07-31 DIAGNOSIS — D631 Anemia in chronic kidney disease: Secondary | ICD-10-CM | POA: Diagnosis not present

## 2016-07-31 DIAGNOSIS — D509 Iron deficiency anemia, unspecified: Secondary | ICD-10-CM | POA: Diagnosis not present

## 2016-07-31 DIAGNOSIS — N2581 Secondary hyperparathyroidism of renal origin: Secondary | ICD-10-CM | POA: Diagnosis not present

## 2016-07-31 DIAGNOSIS — N186 End stage renal disease: Secondary | ICD-10-CM | POA: Diagnosis not present

## 2016-08-02 DIAGNOSIS — N2581 Secondary hyperparathyroidism of renal origin: Secondary | ICD-10-CM | POA: Diagnosis not present

## 2016-08-02 DIAGNOSIS — D689 Coagulation defect, unspecified: Secondary | ICD-10-CM | POA: Diagnosis not present

## 2016-08-02 DIAGNOSIS — D631 Anemia in chronic kidney disease: Secondary | ICD-10-CM | POA: Diagnosis not present

## 2016-08-02 DIAGNOSIS — D509 Iron deficiency anemia, unspecified: Secondary | ICD-10-CM | POA: Diagnosis not present

## 2016-08-02 DIAGNOSIS — N186 End stage renal disease: Secondary | ICD-10-CM | POA: Diagnosis not present

## 2016-08-04 DIAGNOSIS — N2581 Secondary hyperparathyroidism of renal origin: Secondary | ICD-10-CM | POA: Diagnosis not present

## 2016-08-04 DIAGNOSIS — D631 Anemia in chronic kidney disease: Secondary | ICD-10-CM | POA: Diagnosis not present

## 2016-08-04 DIAGNOSIS — D689 Coagulation defect, unspecified: Secondary | ICD-10-CM | POA: Diagnosis not present

## 2016-08-04 DIAGNOSIS — N186 End stage renal disease: Secondary | ICD-10-CM | POA: Diagnosis not present

## 2016-08-04 DIAGNOSIS — D509 Iron deficiency anemia, unspecified: Secondary | ICD-10-CM | POA: Diagnosis not present

## 2016-08-07 DIAGNOSIS — D631 Anemia in chronic kidney disease: Secondary | ICD-10-CM | POA: Diagnosis not present

## 2016-08-07 DIAGNOSIS — N2581 Secondary hyperparathyroidism of renal origin: Secondary | ICD-10-CM | POA: Diagnosis not present

## 2016-08-07 DIAGNOSIS — D689 Coagulation defect, unspecified: Secondary | ICD-10-CM | POA: Diagnosis not present

## 2016-08-07 DIAGNOSIS — D509 Iron deficiency anemia, unspecified: Secondary | ICD-10-CM | POA: Diagnosis not present

## 2016-08-07 DIAGNOSIS — N186 End stage renal disease: Secondary | ICD-10-CM | POA: Diagnosis not present

## 2016-08-08 DIAGNOSIS — E859 Amyloidosis, unspecified: Secondary | ICD-10-CM | POA: Diagnosis not present

## 2016-08-08 DIAGNOSIS — N186 End stage renal disease: Secondary | ICD-10-CM | POA: Diagnosis not present

## 2016-08-08 DIAGNOSIS — Z992 Dependence on renal dialysis: Secondary | ICD-10-CM | POA: Diagnosis not present

## 2016-10-26 IMAGING — US US RENAL
1 series · 14 of 25 positions shown · non-contrast
Comparison: None.

CLINICAL DATA: Amyloidosis.

EXAM:
RENAL / URINARY TRACT ULTRASOUND COMPLETE

[Series 1: us renal · 0.22mm/px · 14 of 44 slices shown]
[im 1/44]
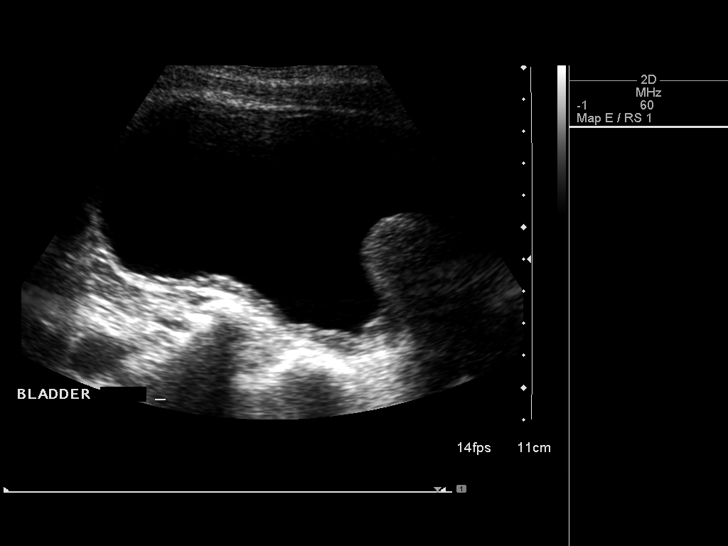
[im 4/44]
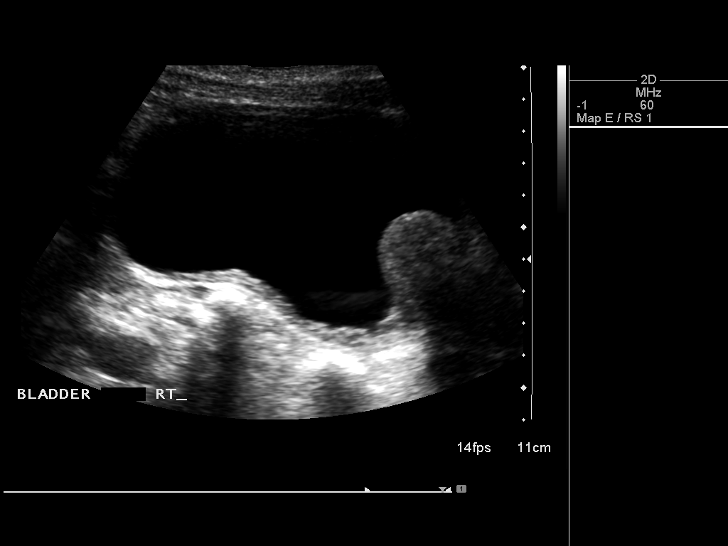
[im 8/44]
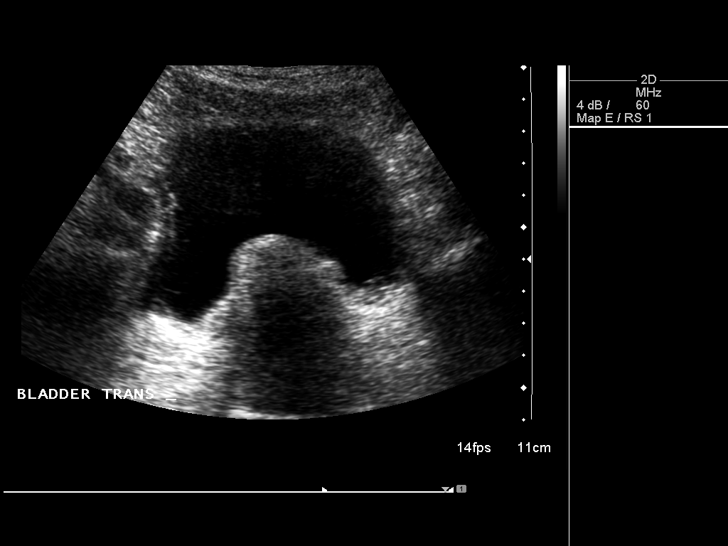
[im 11/44]
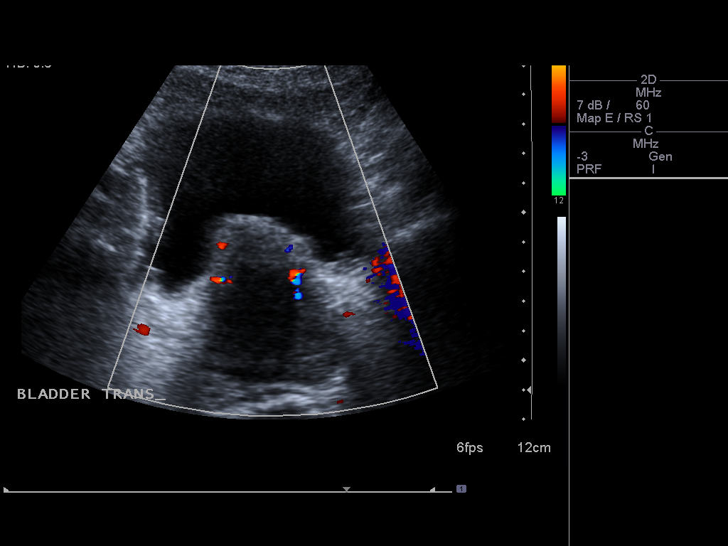
[im 15/44]
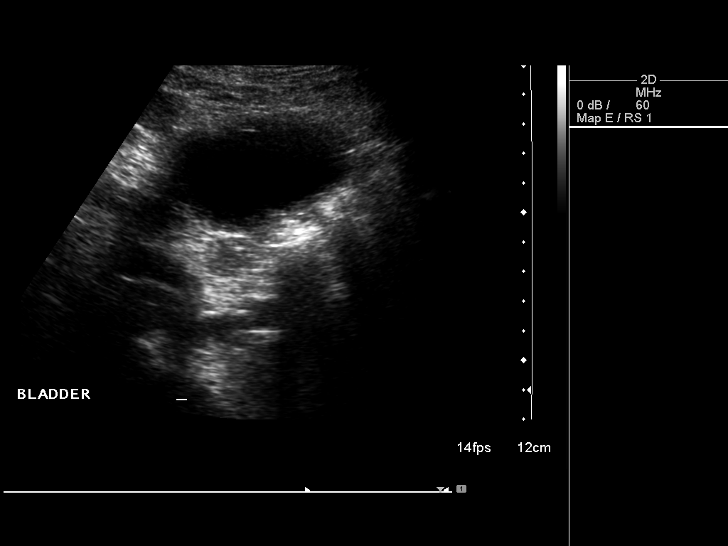
[im 17/44]
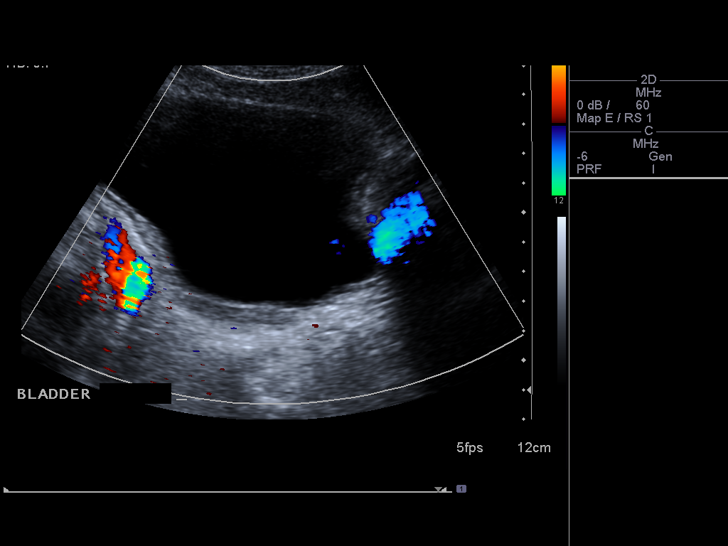
[im 20/44]
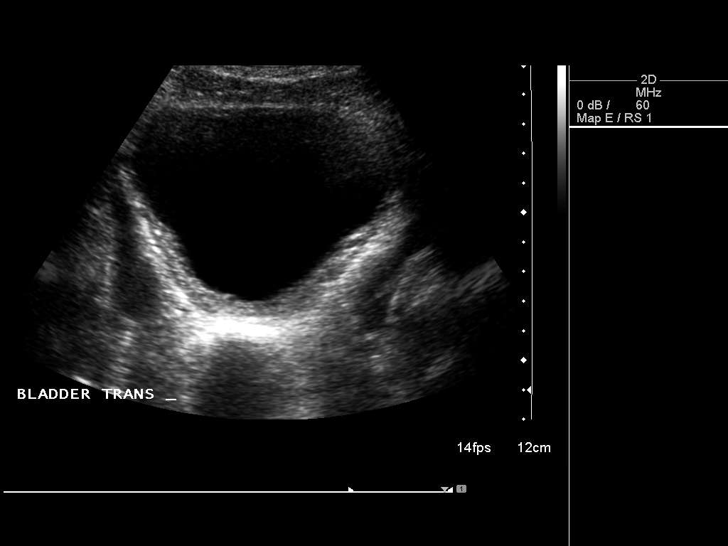
[im 24/44]
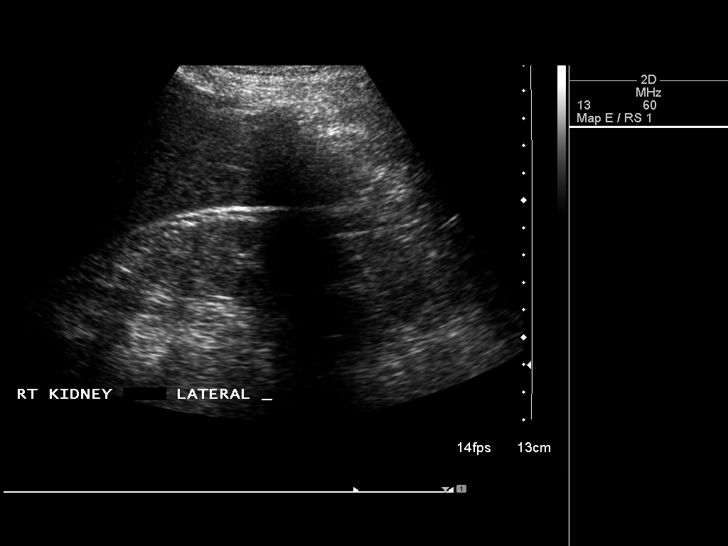
[im 27/44]
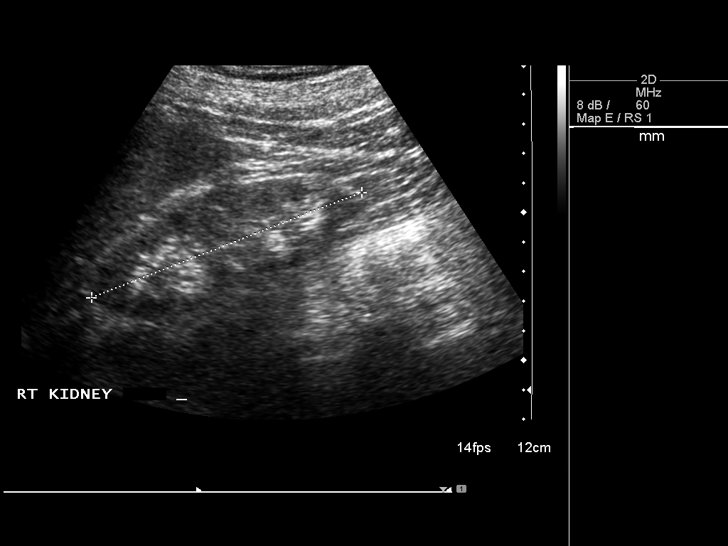
[im 29/44]
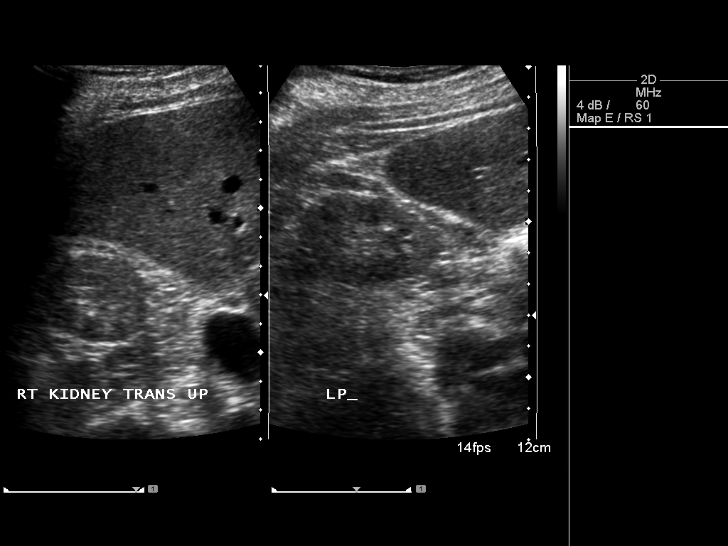
[im 33/44]
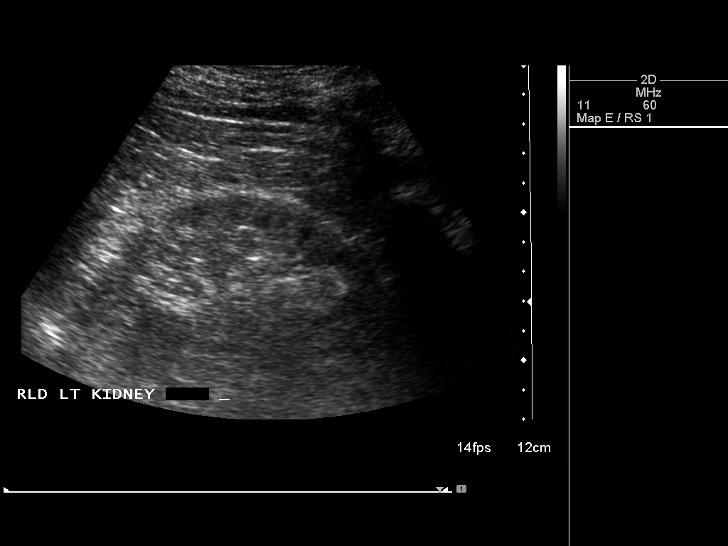
[im 36/44]
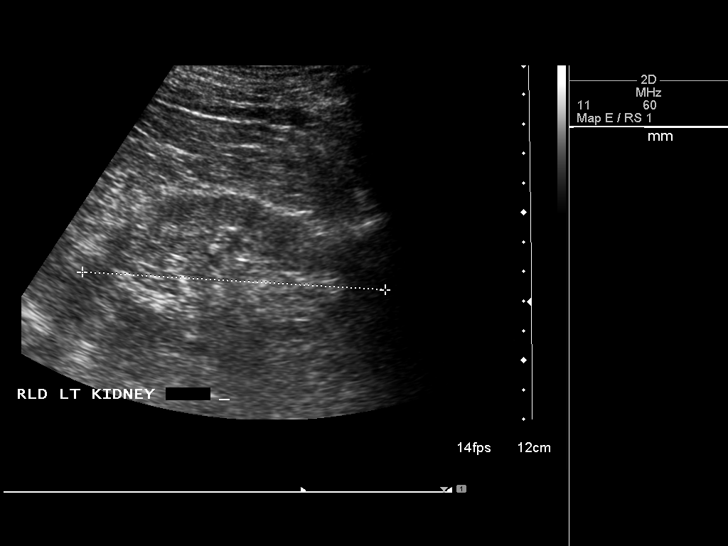
[im 40/44]
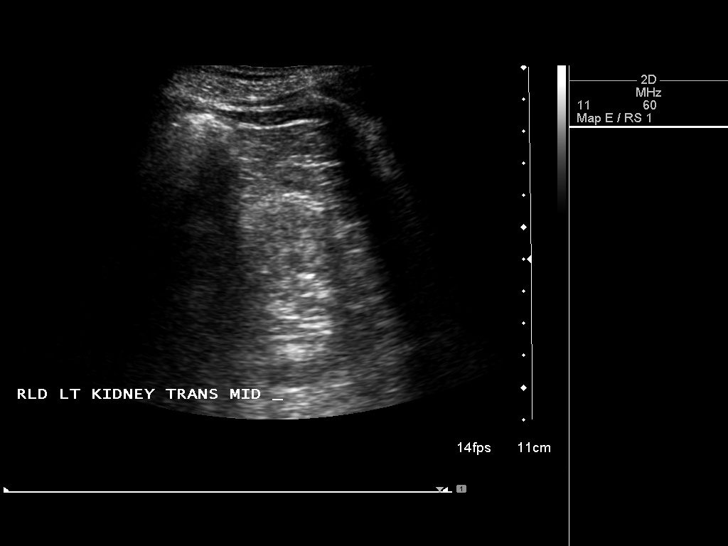
[im 44/44]
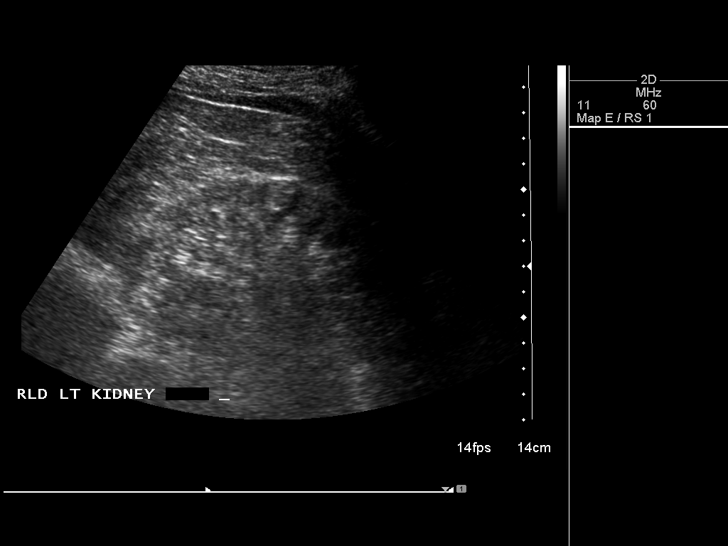

[14 of 25 positions shown; findings below may reference images not displayed]

FINDINGS: Right Kidney:

Length: 9.8 cm. Increased echogenicity of renal parenchyma is noted.
No mass or hydronephrosis visualized.

Left Kidney:

Length: 10.3 cm. Increased echogenicity of renal parenchyma is
noted. No mass or hydronephrosis visualized.

Bladder:

Slightly thickened bladder wall is seen inferiorly. Mild prostatic
enlargement is noted.
IMPRESSION: No hydronephrosis or renal obstruction is noted. Increased
echogenicity of renal parenchyma is noted bilaterally suggesting
medical renal disease.

Mild prostatic enlargement is noted.

Slightly thickened bladder wall is noted inferiorly suggesting the
possibility of cystitis. Clinical correlation is recommended.

## 2017-04-23 ENCOUNTER — Encounter (HOSPITAL_COMMUNITY): Payer: Self-pay | Admitting: Emergency Medicine

## 2017-04-23 ENCOUNTER — Emergency Department (HOSPITAL_COMMUNITY): Payer: Medicare Other

## 2017-04-23 ENCOUNTER — Emergency Department (HOSPITAL_COMMUNITY)
Admission: EM | Admit: 2017-04-23 | Discharge: 2017-04-23 | Discharge status: Against Medical Advice | Payer: Medicare Other | Attending: Emergency Medicine | Admitting: Emergency Medicine

## 2017-04-23 DIAGNOSIS — N185 Chronic kidney disease, stage 5: Secondary | ICD-10-CM | POA: Insufficient documentation

## 2017-04-23 DIAGNOSIS — J189 Pneumonia, unspecified organism: Secondary | ICD-10-CM | POA: Insufficient documentation

## 2017-04-23 DIAGNOSIS — Z7982 Long term (current) use of aspirin: Secondary | ICD-10-CM | POA: Insufficient documentation

## 2017-04-23 DIAGNOSIS — E8770 Fluid overload, unspecified: Secondary | ICD-10-CM | POA: Insufficient documentation

## 2017-04-23 DIAGNOSIS — R0602 Shortness of breath: Secondary | ICD-10-CM | POA: Diagnosis present

## 2017-04-23 DIAGNOSIS — Z79899 Other long term (current) drug therapy: Secondary | ICD-10-CM | POA: Diagnosis not present

## 2017-04-23 DIAGNOSIS — J181 Lobar pneumonia, unspecified organism: Secondary | ICD-10-CM

## 2017-04-23 DIAGNOSIS — F17210 Nicotine dependence, cigarettes, uncomplicated: Secondary | ICD-10-CM | POA: Diagnosis not present

## 2017-04-23 DIAGNOSIS — E8779 Other fluid overload: Secondary | ICD-10-CM

## 2017-04-23 DIAGNOSIS — I120 Hypertensive chronic kidney disease with stage 5 chronic kidney disease or end stage renal disease: Secondary | ICD-10-CM | POA: Diagnosis not present

## 2017-04-23 DIAGNOSIS — I480 Paroxysmal atrial fibrillation: Secondary | ICD-10-CM | POA: Insufficient documentation

## 2017-04-23 LAB — CBC WITH DIFFERENTIAL/PLATELET
BASOS ABS: 0 10*3/uL (ref 0.0–0.1)
BASOS PCT: 1 %
EOS PCT: 7 %
Eosinophils Absolute: 0.4 10*3/uL (ref 0.0–0.7)
HCT: 29.1 % — ABNORMAL LOW (ref 39.0–52.0)
Hemoglobin: 9.4 g/dL — ABNORMAL LOW (ref 13.0–17.0)
LYMPHS PCT: 15 %
Lymphs Abs: 0.9 10*3/uL (ref 0.7–4.0)
MCH: 32.1 pg (ref 26.0–34.0)
MCHC: 32.3 g/dL (ref 30.0–36.0)
MCV: 99.3 fL (ref 78.0–100.0)
Monocytes Absolute: 0.8 10*3/uL (ref 0.1–1.0)
Monocytes Relative: 15 %
NEUTROS ABS: 3.6 10*3/uL (ref 1.7–7.7)
Neutrophils Relative %: 62 %
Platelets: 151 10*3/uL (ref 150–400)
RBC: 2.93 MIL/uL — AB (ref 4.22–5.81)
RDW: 13.3 % (ref 11.5–15.5)
WBC: 5.7 10*3/uL (ref 4.0–10.5)

## 2017-04-23 LAB — COMPREHENSIVE METABOLIC PANEL
ALBUMIN: 3.4 g/dL — AB (ref 3.5–5.0)
ALT: 6 U/L — AB (ref 17–63)
AST: 9 U/L — AB (ref 15–41)
Alkaline Phosphatase: 52 U/L (ref 38–126)
Anion gap: 10 (ref 5–15)
BUN: 21 mg/dL — AB (ref 6–20)
CO2: 31 mmol/L (ref 22–32)
CREATININE: 8.39 mg/dL — AB (ref 0.61–1.24)
Calcium: 8.9 mg/dL (ref 8.9–10.3)
Chloride: 100 mmol/L — ABNORMAL LOW (ref 101–111)
GFR calc Af Amer: 6 mL/min — ABNORMAL LOW (ref >60)
GFR, EST NON AFRICAN AMERICAN: 5 mL/min — AB (ref >60)
GLUCOSE: 85 mg/dL (ref 65–99)
Potassium: 3.5 mmol/L (ref 3.5–5.1)
Sodium: 141 mmol/L (ref 135–145)
Total Bilirubin: 1 mg/dL (ref 0.3–1.2)
Total Protein: 6.1 g/dL — ABNORMAL LOW (ref 6.5–8.1)

## 2017-04-23 LAB — I-STAT TROPONIN, ED: TROPONIN I, POC: 0.05 ng/mL (ref 0.00–0.08)

## 2017-04-23 LAB — BRAIN NATRIURETIC PEPTIDE: B Natriuretic Peptide: 2928.7 pg/mL — ABNORMAL HIGH (ref 0.0–100.0)

## 2017-04-23 LAB — TSH: TSH: 0.636 u[IU]/mL (ref 0.350–4.500)

## 2017-04-23 LAB — MAGNESIUM: Magnesium: 2.3 mg/dL (ref 1.7–2.4)

## 2017-04-23 MED ORDER — METOPROLOL SUCCINATE ER 25 MG PO TB24: 25.0000 mg | ORAL_TABLET | Freq: Every day | ORAL | 0 refills | Status: DC

## 2017-04-23 MED ORDER — AMOXICILLIN-POT CLAVULANATE 500-125 MG PO TABS: 1.0000 | ORAL_TABLET | Freq: Every day | ORAL | 0 refills | Status: AC

## 2017-04-23 MED ORDER — DOXYCYCLINE HYCLATE 100 MG PO TABS
100.0000 mg | ORAL_TABLET | Freq: Once | ORAL | Status: AC
  Administered 2017-04-23: 100 mg via ORAL
  Filled 2017-04-23: qty 1

## 2017-04-23 MED ORDER — DOXYCYCLINE HYCLATE 100 MG PO CAPS: 100.0000 mg | ORAL_CAPSULE | Freq: Two times a day (BID) | ORAL | 0 refills | Status: DC

## 2017-04-23 MED ORDER — AMOXICILLIN-POT CLAVULANATE 875-125 MG PO TABS
1.0000 | ORAL_TABLET | Freq: Once | ORAL | Status: AC
  Administered 2017-04-23: 1 via ORAL
  Filled 2017-04-23: qty 1

## 2017-04-23 MED ORDER — METOPROLOL SUCCINATE ER 50 MG PO TB24
50.0000 mg | ORAL_TABLET | Freq: Once | ORAL | Status: DC
  Filled 2017-04-23: qty 1

## 2017-04-23 MED ORDER — METOPROLOL SUCCINATE ER 50 MG PO TB24
50.0000 mg | ORAL_TABLET | Freq: Once | ORAL | Status: AC
  Administered 2017-04-23: 50 mg via ORAL
  Filled 2017-04-23: qty 1

## 2017-04-23 NOTE — Discharge Instructions (Signed)
IT IS VERY IMPORTANT THAT YOU TAKE YOUR MEDICATIONS, ESPECIALLY THE METOPROLOL (TOPROL). THIS IS TO HELP CONTROL YOUR HEART RATE AND PREVENT YOUR ATRIAL FIBRILLATION FROM CAUSING HEART FAILURE, HEART ATTACKS, AND DEATH

## 2017-04-23 NOTE — ED Provider Notes (Signed)
[facility]-EMERGENCY DEPT Provider Note   CSN: [id number] Arrival date & time: 04/23/17  3086     History   Chief Complaint Chief Complaint  Patient presents with  . Shortness of Breath    HPI Shane [name] is a 76 y.o. male.  HPI  76 yo M with h/o AML, ESRD, h/o AFib RVR not on any coagulation who p/w SOB Pt states that over the weekend, he filt mildly dizzy and had one episode of CP last night CP lasted 30 minutes, not associated with n/v, was a havy pressure over left side of his chest - resolved w/ rest Went to HD today, during dialysis he mentioned this and during session he became SOB HD also noticed he had irregular HR, which was reportedly new/worse for him He had no associated CP, dizziness during the episode today He states these episodes have been coming/going more frequently recently No recent fever, chills, or illnesses No abdominal pain, n/v/d, or recent med changes  Past Medical History:  Diagnosis Date  . 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 Arthritis    bursitis in hip  . Bilateral inguinal hernia (BIH)    right > left side  . 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 Reynolds Bowl  . Elevated troponin 04/12/2015  . Full dentures   . Hypertension   . Tobacco abuse   . Urinary tract infection due to Enterococcus 08/03/2015  . Varicocele     Patient Active Problem List   Diagnosis Date Noted  . Atrial fibrillation with RVR (HCC) 06/29/2016  . Adjustment disorder with other symptom   . Atrial fibrillation with rapid ventricular response (HCC) 06/28/2016  . Toxic metabolic encephalopathy 12/01/2015  . Bilateral inguinal hernia (BIH) s/p lap repair w mesh 11/25/2015 11/25/2015  . Hydrocele s/p partial rexction/unroofing 11/25/2015 11/25/2015  . 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 Pulmonary hypertension (HCC) 08/04/2015  .  Essential hypertension   . Acute encephalopathy 04/12/2015  . CKD (chronic kidney disease) stage V requiring chronic dialysis (HCC) 04/12/2015  . Anorexia 04/12/2015  . Amyloidosis (HCC) 04/12/2015  . Uremia 04/12/2015  . Tobacco abuse 02/09/2013  . Hypertension 02/09/2013    Past Surgical History:  Procedure Laterality Date  . AV FISTULA PLACEMENT, BRACHIOCEPHALIC  10/19/2010   LEFT  . AV FISTULA PLACEMENT, RADIOCEPHALIC  01/30/2011   Right w/ ligation of competing venous branch by Dr. Leonides Sake  . AV FISTULA PLACEMENT, RADIOCEPHALIC  09/13/2010   LEFT  . BASCILIC VEIN TRANSPOSITION Left 04/08/2015   Procedure: BASILIC VEIN TRANSPOSITION;  Surgeon: Pryor Ochoa, MD;  Location: [facility] OR;  Service: Vascular;  Laterality: Left;  . COLONOSCOPY    . INGUINAL HERNIA REPAIR Bilateral 11/25/2015   Procedure: LAPAROSCOPIC BILATERAL INGUINAL HERNIA REPAIR--EXCISION OF RIGHT HYDROCELE;  Surgeon: Karie Soda, MD;  Location: [facility] OR;  Service: General;  Laterality: Bilateral;  . INSERTION OF MESH Bilateral 11/25/2015   Procedure: INSERTION OF MESH;  Surgeon: Karie Soda, MD;  Location: [facility] OR;  Service: General;  Laterality: Bilateral;  . MULTIPLE TOOTH EXTRACTIONS         Home Medications    Prior to Admission medications   Medication Sig Start Date End Date Taking? Authorizing Provider  ammonium lactate (AMLACTIN) 12 % cream Apply 1 application topically daily. 03/20/17  Yes [provider]  betamethasone valerate (VALISONE) 0.1 % cream  Apply 1 application topically daily. 03/22/17  Yes [provider]  calcium acetate (PHOSLO) 667 MG capsule Take 2,001 mg by mouth 3 (three) times daily with meals.    Yes [provider]  diltiazem (CARDIZEM CD) 120 MG 24 hr capsule Take 1 capsule (120 mg total) by mouth daily. 06/29/16  Yes Albertine GratesXu, Fang, MD  albuterol-ipratropium [facility](COMBIVENT) [phone] MCG/ACT inhaler Inhale 2 puffs into the lungs every 6 (six) hours as needed for  wheezing. Patient not taking: Reported on 04/23/2017 02/11/13   Genelle GatherGlenn, Kathryn F, MD  amoxicillin-clavulanate (AUGMENTIN) 500-125 MG tablet Take 1 tablet (500 mg total) by mouth daily. Administer dose both before and after dialysis. 04/23/17 05/03/17  Shaune PollackIsaacs, Yarima Penman, MD  aspirin EC 325 MG tablet Take 1 tablet (325 mg total) by mouth daily. Your may choose to take baby aspirin (81mg ) three tabs daily, instead the 325mg  tabs daily. Patient not taking: Reported on 04/23/2017 06/29/16   Albertine GratesXu, Fang, MD  doxycycline (VIBRAMYCIN) 100 MG capsule Take 1 capsule (100 mg total) by mouth 2 (two) times daily. 04/23/17   Shaune PollackIsaacs, Lexie Morini, MD  metoprolol succinate (TOPROL-XL) 25 MG 24 hr tablet Take 1 tablet (25 mg total) by mouth daily. 04/23/17   Shaune PollackIsaacs, Shakenna Herrero, MD  Nutritional Supplements (FEEDING SUPPLEMENT, NEPRO CARB STEADY,) LIQD Take 237 mLs by mouth 2 (two) times daily between meals. Patient not taking: Reported on 04/23/2017 12/01/15   Maretta BeesGhimire, Shanker M, MD  traMADol (ULTRAM) 50 MG tablet Take 1-2 tablets (50-100 mg total) by mouth every 6 (six) hours as needed for moderate pain or severe pain. Patient not taking: Reported on 04/23/2017 11/25/15   Karie SodaGross, Steven, MD    Family History Family History  Problem Relation Age of Onset  . Asthma Mother   . Hypertension Mother   . Alcohol abuse Father   . Hypertension Father   . Hypertension Sister   . Diabetes Sister   . Heart disease Sister   . Hypertension Brother   . Peripheral vascular disease Brother   . Hypertension Daughter   . Heart disease Daughter     Social History Social History  Substance Use Topics  . Smoking status: Current Every Day Smoker    Packs/day: 1.00    Years: 50.00    Types: Cigarettes  . Smokeless tobacco: Never Used  . Alcohol use No     Allergies   Patient has no known allergies.   Review of Systems Review of Systems  Constitutional: Positive for fatigue.  Respiratory: Positive for chest tightness and shortness of  breath.   Cardiovascular: Positive for palpitations.  All other systems reviewed and are negative.    Physical Exam Updated Vital Signs BP 140/80   Pulse 70   Temp 98.2 F (36.8 C)   Resp 15   Ht 5\' 11"  (1.803 m)   Wt 65.8 kg (145 lb)   SpO2 100%   BMI 20.22 kg/m   Physical Exam  Constitutional: He is oriented to person, place, and time. He appears well-developed and well-nourished. No distress.  HENT:  Head: Normocephalic and atraumatic.  Eyes: Conjunctivae are normal.  Neck: Neck supple.  Cardiovascular: Normal heart sounds.  An irregularly irregular rhythm present. Tachycardia present.  Exam reveals no friction rub.   No murmur heard. Pulmonary/Chest: Effort normal and breath sounds normal. No respiratory distress. He has no wheezes. He has no rales.  Abdominal: He exhibits no distension.  Musculoskeletal: He exhibits no edema.  Neurological: He is alert and oriented to person,  place, and time. He exhibits normal muscle tone.  Skin: Skin is warm. Capillary refill takes less than 2 seconds.  Psychiatric: He has a normal mood and affect.  Nursing note and vitals reviewed.    ED Treatments / Results  Labs (all labs ordered are listed, but only abnormal results are displayed) Labs Reviewed  CBC WITH DIFFERENTIAL/PLATELET - Abnormal; Notable for the following:       Result Value   RBC 2.93 (*)    Hemoglobin 9.4 (*)    HCT 29.1 (*)    All other components within normal limits  COMPREHENSIVE METABOLIC PANEL - Abnormal; Notable for the following:    Chloride 100 (*)    BUN 21 (*)    Creatinine, Ser 8.39 (*)    Total Protein 6.1 (*)    Albumin 3.4 (*)    AST 9 (*)    ALT 6 (*)    GFR calc non Af Amer 5 (*)    GFR calc Af Amer 6 (*)    All other components within normal limits  BRAIN NATRIURETIC PEPTIDE - Abnormal; Notable for the following:    B Natriuretic Peptide 2,928.7 (*)    All other components within normal limits  MAGNESIUM  TSH  I-STAT TROPOININ, ED     EKG  EKG Interpretation  Date/Time:  Monday April 23 2017 08:39:31 EDT Ventricular Rate:  93 PR Interval:    QRS Duration: 137 QT Interval:  454 QTC Calculation: 565 R Axis:   52 Text Interpretation:  Atrial fibrillation Right bundle branch block Since last EKG, atrial fibrillation has replaced sinus rhythm Confirmed by Shaune Pollack [phone]) on 04/23/2017 9:12:55 AM Also confirmed by Shaune Pollack [phone]), editor Misty Stanley [phone])  on 04/23/2017 9:35:20 AM       Radiology Dg Chest 2 View  Result Date: 04/23/2017 CLINICAL DATA:  Shortness of breath.  Chronic renal failure EXAM: CHEST  2 VIEW COMPARISON:  June 28, 2016 FINDINGS: There is patchy infiltrate in the posterior left base. Lungs elsewhere are clear. Heart is mildly enlarged with pulmonary vascularity within normal limits. There is aortic atherosclerosis. No adenopathy. No bone lesions. IMPRESSION: Patchy infiltrate posterior left lung base, felt to represent pneumonia. Lungs elsewhere clear. Mild cardiac enlargement. Aortic atherosclerosis. Aortic Atherosclerosis (ICD10-I70.0). Followup PA and lateral chest radiographs recommended in 3-4 weeks following trial of antibiotic therapy to ensure resolution and exclude underlying malignancy. Electronically Signed   By: Bretta Bang III M.D.   On: 04/23/2017 09:45    Procedures Procedures (including critical care time)  Medications Ordered in ED Medications  metoprolol succinate (TOPROL-XL) 24 hr tablet 50 mg (50 mg Oral Given 04/23/17 1019)  amoxicillin-clavulanate (AUGMENTIN) 875-125 MG per tablet 1 tablet (1 tablet Oral Given 04/23/17 1109)  doxycycline (VIBRA-TABS) tablet 100 mg (100 mg Oral Given 04/23/17 1109)     Initial Impression / Assessment and Plan / ED Course  I have reviewed the triage vital signs and the nursing notes.  Pertinent labs & imaging results that were available during my care of the patient were reviewed by me and considered in  my medical decision making (see chart for details).     76 year old male with extensive past medical history as above here with intermittent shortness of breath associated with symptomatic palpitations. I suspect the patient is going into intermittent paroxysmal atrial fibrillation. He has a history of the same and admits that he has not taken any of his medications, including his beta blocker or his calcium  channel blocker, for several months. Currently, he is in intermittent atrial fibrillation but his rate is controlled primarily. Labwork shows mild acute on chronic anemia but he has no signs of active bleeding. Troponin is negative, which is reassuring, especially in the setting of his end-stage renal disease. CMP is acceptable. Of note, however, BNP is elevated and chest x-ray is concerning for left lower lobe pneumonia. He has no hypoxia, tachypnea, or respiratory distress. I discussed these findings with the patient and recommended admission. Based on a long discussion, the patient is adamant that he will not be admitted. He understands that he has a potential pneumonia as well as fluid overload, and is at risk for worsening respiratory decompensation and possibly cardiac arrest and death. Patient appears alert, oriented, and appropriately competent to make this decision. Will place the patient on antibiotics, advised adherence with his medications, and discharge AMA. I encouraged him to complete dialysis today, or tomorrow, and follow-up with his doctor or return to the ER immediately if any symptoms worsen.  This note was prepared with assistance of Conservation officer, historic buildings. Occasional wrong-word or sound-a-like substitutions may have occurred due to the inherent limitations of voice recognition software.   Final Clinical Impressions(s) / ED Diagnoses   Final diagnoses:  Paroxysmal atrial fibrillation (HCC)  Other hypervolemia  Pneumonia of left lower lobe due to infectious organism  [facility])    New Prescriptions Discharge Medication List as of 04/23/2017 10:46 AM    START taking these medications   Details  amoxicillin-clavulanate (AUGMENTIN) 500-125 MG tablet Take 1 tablet (500 mg total) by mouth daily. Administer dose both before and after dialysis., Starting Mon 04/23/2017, Until Thu 05/03/2017, Print    doxycycline (VIBRAMYCIN) 100 MG capsule Take 1 capsule (100 mg total) by mouth 2 (two) times daily., Starting Mon 04/23/2017, Print         Shaune Pollack, MD 04/23/17 [phone]

## 2017-04-23 NOTE — ED Triage Notes (Signed)
Patient coming from dialysis today complaining of intermittent shortness of breath since Friday. Patient experienced chest pain last night that subsided on its own. Patient denies nausea, diaphoresis. Dialysis center called ems due to patient snoring while he fell asleep. Patient reports intermittent episodes of shortness of breath lasting a couple seconds. Controlled afib on the monitor rate 95. Patient alert and oriented x 4 on arrival.

## 2017-04-23 NOTE — ED Notes (Signed)
Patient transported to X-ray 

## 2017-04-24 ENCOUNTER — Emergency Department (HOSPITAL_COMMUNITY): Payer: Medicare Other

## 2017-04-24 ENCOUNTER — Emergency Department (HOSPITAL_COMMUNITY)
Admission: EM | Admit: 2017-04-24 | Discharge: 2017-04-25 | Discharge status: Against Medical Advice | Payer: Medicare Other | Attending: Emergency Medicine | Admitting: Emergency Medicine

## 2017-04-24 ENCOUNTER — Encounter (HOSPITAL_COMMUNITY): Payer: Self-pay | Admitting: Emergency Medicine

## 2017-04-24 DIAGNOSIS — R0602 Shortness of breath: Secondary | ICD-10-CM | POA: Diagnosis not present

## 2017-04-24 DIAGNOSIS — Z992 Dependence on renal dialysis: Secondary | ICD-10-CM | POA: Insufficient documentation

## 2017-04-24 DIAGNOSIS — Z79899 Other long term (current) drug therapy: Secondary | ICD-10-CM | POA: Diagnosis not present

## 2017-04-24 DIAGNOSIS — I499 Cardiac arrhythmia, unspecified: Secondary | ICD-10-CM | POA: Diagnosis not present

## 2017-04-24 DIAGNOSIS — Z5320 Procedure and treatment not carried out because of patient's decision for unspecified reasons: Secondary | ICD-10-CM | POA: Diagnosis not present

## 2017-04-24 DIAGNOSIS — F17210 Nicotine dependence, cigarettes, uncomplicated: Secondary | ICD-10-CM | POA: Diagnosis not present

## 2017-04-24 DIAGNOSIS — R0789 Other chest pain: Secondary | ICD-10-CM | POA: Diagnosis present

## 2017-04-24 DIAGNOSIS — R42 Dizziness and giddiness: Secondary | ICD-10-CM | POA: Insufficient documentation

## 2017-04-24 DIAGNOSIS — Z7982 Long term (current) use of aspirin: Secondary | ICD-10-CM | POA: Insufficient documentation

## 2017-04-24 DIAGNOSIS — N186 End stage renal disease: Secondary | ICD-10-CM | POA: Insufficient documentation

## 2017-04-24 DIAGNOSIS — I498 Other specified cardiac arrhythmias: Secondary | ICD-10-CM

## 2017-04-24 DIAGNOSIS — I120 Hypertensive chronic kidney disease with stage 5 chronic kidney disease or end stage renal disease: Secondary | ICD-10-CM | POA: Insufficient documentation

## 2017-04-24 LAB — COMPREHENSIVE METABOLIC PANEL
ALT: 8 U/L — AB (ref 17–63)
AST: 11 U/L — AB (ref 15–41)
Albumin: 3.8 g/dL (ref 3.5–5.0)
Alkaline Phosphatase: 58 U/L (ref 38–126)
Anion gap: 14 (ref 5–15)
BILIRUBIN TOTAL: 1 mg/dL (ref 0.3–1.2)
BUN: 39 mg/dL — AB (ref 6–20)
CHLORIDE: 99 mmol/L — AB (ref 101–111)
CO2: 28 mmol/L (ref 22–32)
CREATININE: 12.22 mg/dL — AB (ref 0.61–1.24)
Calcium: 9.8 mg/dL (ref 8.9–10.3)
GFR calc Af Amer: 4 mL/min — ABNORMAL LOW (ref >60)
GFR, EST NON AFRICAN AMERICAN: 3 mL/min — AB (ref >60)
Glucose, Bld: 89 mg/dL (ref 65–99)
Potassium: 4.3 mmol/L (ref 3.5–5.1)
Sodium: 141 mmol/L (ref 135–145)
TOTAL PROTEIN: 6.5 g/dL (ref 6.5–8.1)

## 2017-04-24 LAB — URINALYSIS, ROUTINE W REFLEX MICROSCOPIC
BACTERIA UA: NONE SEEN
Bilirubin Urine: NEGATIVE
Glucose, UA: 50 mg/dL — AB
Hgb urine dipstick: NEGATIVE
Ketones, ur: NEGATIVE mg/dL
Leukocytes, UA: NEGATIVE
Nitrite: NEGATIVE
PROTEIN: 100 mg/dL — AB
SPECIFIC GRAVITY, URINE: 1.012 (ref 1.005–1.030)
pH: 9 — ABNORMAL HIGH (ref 5.0–8.0)

## 2017-04-24 LAB — CBC WITH DIFFERENTIAL/PLATELET
BASOS ABS: 0 10*3/uL (ref 0.0–0.1)
Basophils Relative: 1 %
EOS PCT: 7 %
Eosinophils Absolute: 0.4 10*3/uL (ref 0.0–0.7)
HCT: 30 % — ABNORMAL LOW (ref 39.0–52.0)
Hemoglobin: 9.7 g/dL — ABNORMAL LOW (ref 13.0–17.0)
LYMPHS PCT: 26 %
Lymphs Abs: 1.3 10*3/uL (ref 0.7–4.0)
MCH: 32.3 pg (ref 26.0–34.0)
MCHC: 32.3 g/dL (ref 30.0–36.0)
MCV: 100 fL (ref 78.0–100.0)
MONO ABS: 0.5 10*3/uL (ref 0.1–1.0)
MONOS PCT: 9 %
Neutro Abs: 2.9 10*3/uL (ref 1.7–7.7)
Neutrophils Relative %: 57 %
PLATELETS: 156 10*3/uL (ref 150–400)
RBC: 3 MIL/uL — ABNORMAL LOW (ref 4.22–5.81)
RDW: 13.7 % (ref 11.5–15.5)
WBC: 5 10*3/uL (ref 4.0–10.5)

## 2017-04-24 LAB — PROTIME-INR
INR: 1.03
Prothrombin Time: 13.5 seconds (ref 11.4–15.2)

## 2017-04-24 LAB — I-STAT CG4 LACTIC ACID, ED: LACTIC ACID, VENOUS: 1.57 mmol/L (ref 0.5–1.9)

## 2017-04-24 NOTE — ED Triage Notes (Signed)
Pt c/o dizziness that began Saturday, pt states he was seen for the same yesterday. Pt denies chest pain, states he is shortness of breath. Denies LOC, dizziness worse with standing.

## 2017-04-24 NOTE — ED Notes (Signed)
Pt states he was diagnosed with pneumonia yesterday and started on antibiotics.

## 2017-04-24 NOTE — ED Provider Notes (Signed)
By signing my name below, I, [name], attest that this documentation has been prepared under the direction and in the presence of Genowefa Morga, Layla Maw, DO. Electronically Signed: [name], ED Scribe. 04/25/17. 12:02 AM.    TIME SEEN: 11:51 PM  CHIEF COMPLAINT: SOB, Dizziness, heart problems  HPI: Shane [name] is a 76 y.o. male with history of end-stage renal disease on hemodialysis Monday, Wednesday and Friday, paroxysmal atrial fibrillation who presents to the Emergency Department complaining of complaints of feeling like his heart was irregular. He also reports that his heart "speeds-up," but is unsure as to how fast it goes.  He states that he is feeling short of breath intermittently but not currently. Reports that the symptoms only last for less than 1 minute at a time. Has also had intermittent chest discomfort. None currently. The pt was also seen for the same yesterday at [facility], was told that he may have a left lower lobe pneumonia and was put on antibiotics. EKG reportedly showed atrial fibrillation at that time. Currently he is in sinus arrhythmia.  He denies fever, cough, vomiting, diarrhea.  Last dialysis was July 16.   ROS: See HPI Constitutional: no fever  Eyes: no drainage  ENT: no runny nose,    Cardiovascular:  No chest pain, positive for chest discomfort  Resp: Positive for SOB GI: no vomiting, no diarrhea GU: no dysuria Integumentary: no rash  Allergy: no hives  Musculoskeletal: no leg swelling  Neurological: no slurred speech, positive for dizziness. ROS otherwise negative  PAST MEDICAL HISTORY/PAST SURGICAL HISTORY:  Past Medical History:  Diagnosis Date  . 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 Arthritis    bursitis in hip  . Bilateral inguinal hernia (BIH)    right > left side  . 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 Reynolds Bowl   . Elevated troponin 04/12/2015  . Full dentures   . Hypertension   . Tobacco abuse   . Urinary tract infection due to Enterococcus 08/03/2015  . Varicocele     MEDICATIONS:  Prior to Admission medications   Medication Sig Start Date End Date Taking? Authorizing Provider  albuterol-ipratropium (COMBIVENT) 18-103 MCG/ACT inhaler Inhale 2 puffs into the lungs every 6 (six) hours as needed for wheezing. Patient not taking: Reported on 04/23/2017 02/11/13   Genelle Gather, MD  ammonium lactate (AMLACTIN) 12 % cream Apply 1 application topically daily. 03/20/17   [provider]  amoxicillin-clavulanate (AUGMENTIN) 500-125 MG tablet Take 1 tablet (500 mg total) by mouth daily. Administer dose both before and after dialysis. 04/23/17 05/03/17  Shaune Pollack, MD  aspirin EC 325 MG tablet Take 1 tablet (325 mg total) by mouth daily. Your may choose to take baby aspirin (81mg ) three tabs daily, instead the 325mg  tabs daily. Patient not taking: Reported on 04/23/2017 06/29/16   Albertine Grates, MD  betamethasone valerate (VALISONE) 0.1 % cream Apply 1 application topically daily. 03/22/17   [provider]  calcium acetate (PHOSLO) 667 MG capsule Take 2,001 mg by mouth 3 (three) times daily with meals.     [provider]  diltiazem (CARDIZEM CD) 120 MG 24 hr capsule Take 1 capsule (120 mg total) by mouth daily. 06/29/16   Albertine Grates, MD  doxycycline (VIBRAMYCIN) 100 MG capsule Take 1 capsule (100 mg total) by mouth 2 (two) times daily. 04/23/17   Erma Heritage,  Sheria Langameron, MD  metoprolol succinate (TOPROL-XL) 25 MG 24 hr tablet Take 1 tablet (25 mg total) by mouth daily. 04/23/17   Shaune PollackIsaacs, Cameron, MD  Nutritional Supplements (FEEDING SUPPLEMENT, NEPRO CARB STEADY,) LIQD Take 237 mLs by mouth 2 (two) times daily between meals. Patient not taking: Reported on 04/23/2017 12/01/15   Maretta BeesGhimire, Shanker M, MD  traMADol (ULTRAM) 50 MG tablet Take 1-2 tablets (50-100 mg total) by mouth every 6 (six) hours as  needed for moderate pain or severe pain. Patient not taking: Reported on 04/23/2017 11/25/15   Karie SodaGross, Steven, MD    ALLERGIES:  No Known Allergies  SOCIAL HISTORY:  Social History  Substance Use Topics  . Smoking status: Current Every Day Smoker    Packs/day: 1.00    Years: 50.00    Types: Cigarettes  . Smokeless tobacco: Never Used  . Alcohol use No    FAMILY HISTORY: Family History  Problem Relation Age of Onset  . Asthma Mother   . Hypertension Mother   . Alcohol abuse Father   . Hypertension Father   . Hypertension Sister   . Diabetes Sister   . Heart disease Sister   . Hypertension Brother   . Peripheral vascular disease Brother   . Hypertension Daughter   . Heart disease Daughter     EXAM: BP (!) 143/107   Pulse (!) 115   Temp 98.2 F (36.8 C) (Oral)   Resp 18   Ht 5\' 10"  (1.778 m)   SpO2 93%  CONSTITUTIONAL: Alert and oriented and responds appropriately to questions. Elderly. Chronically ill-appearing. HEAD: Normocephalic EYES: Conjunctivae clear, pupils appear equal, EOMI ENT: normal nose; moist mucous membranes NECK: Supple, no meningismus, no nuchal rigidity, no LAD  CARD: Sinus arrhythmia, rate controlled; S1 and S2 appreciated; no murmurs, no clicks, no rubs, no gallops RESP: Normal chest excursion without splinting or tachypnea; breath sounds clear and equal bilaterally; no wheezes, no rhonchi, no rales, no hypoxia or respiratory distress, speaking full sentences ABD/GI: Normal bowel sounds; non-distended; soft, non-tender, no rebound, no guarding, no peritoneal signs, no hepatosplenomegaly BACK:  The back appears normal and is non-tender to palpation, there is no CVA tenderness EXT: Normal ROM in all joints; non-tender to palpation; no edema; normal capillary refill; no cyanosis, no calf tenderness or swelling    SKIN: Normal color for age and race; warm; no rash NEURO: Moves all extremities equally PSYCH: The patient's mood and manner are  appropriate. Grooming and personal hygiene are appropriate.  MEDICAL DECISION MAKING: Patient here with complaints of intermittent palpitations and feeling like his heart is irregular with intermittent chest discomfort and shortness of breath. Was diagnosed with possible pneumonia yesterday and started on outpatient antibiotics as he left AGAINST MEDICAL ADVICE after admission was recommended. At that time he was noted to be in atrial fibrillation with RVR. Here he is in a sinus arrhythmia with a rate in the 80s. He denies any complaints at this time. He does not appear volume overloaded. He has dialysis scheduled on the morning. Labs show no acute abnormality. Unfortunately troponin was not obtained and I have recommended patient stay for cardiac enzymes but he refuses.  He would like to leave AGAINST MEDICAL ADVICE. He states he is feeling well and does not feel the need for further blood sticks at this time. His family is at bedside. He understands risks that we could be missing a heart attack which could lead to severe disability, death. Patient appears to have capacity. He does  not appear intoxicated. He states given he feels asymptomatic he does not think that this test is necessary despite me explaining to him and his family at bedside while assesses recommended. Patient states he will come back to the hospital if he is feeling poorly.  12:16 AM Patient has decided to leave against medical advice without further workup.  We discussed the nature and purpose, risks and benefits, as well as, the alternatives of treatment. Time was given to allow the opportunity to ask questions and consider their options, and after the discussion, the patient decided to refuse further testing and/or offerred treatment. The patient was informed that refusal could lead to, but was not limited to, death, permanent disability, severe pain, worsening symptoms, disease progression. If present, I asked friends/family to dissuade  them without success. Prior to refusing, I determined that the patient had the capacity to make their decision, does not appear clinically intoxicated and understood the consequences of that decision. Outpatient follow up has been encouraged and provided as needed.  Recommended patient return to the hospital if symptoms worsen, change or they would like further treatment and evaluation.     EKG Interpretation  Date/Time:  Tuesday April 24 2017 19:56:00 EDT Ventricular Rate:  69 PR Interval:  152 QRS Duration: 122 QT Interval:  478 QTC Calculation: 512 R Axis:   5 Text Interpretation:  Sinus rhythm with Premature supraventricular complexes and with occasional Premature ventricular complexes Right bundle branch block Abnormal ECG Confirmed by Rochele Raring [id number]) on 04/24/2017 11:45:20 PM       I personally performed the services described in this documentation, which was scribed in my presence. The recorded information has been reviewed and is accurate.     Alijah Hyde, Layla Maw, DO 04/25/17 [phone]

## 2017-04-25 NOTE — Discharge Instructions (Signed)
We have recommend that you stay for further evaluation and to evaluate your heart. You have decided to leave AGAINST MEDICAL ADVICE. If you have return if your symptoms, I recommend you return to the emergency department immediately. Please continue your antibiotics as prescribed until complete.  I recommend you go to your scheduled dialysis on Wednesday.

## 2017-04-25 NOTE — ED Notes (Signed)
Pt refusing repeat troponin; repeat VS completed. Signed AMA

## 2017-04-29 LAB — CULTURE, BLOOD (ROUTINE X 2)
CULTURE: NO GROWTH
Culture: NO GROWTH
SPECIAL REQUESTS: ADEQUATE
SPECIAL REQUESTS: ADEQUATE

## 2017-06-01 ENCOUNTER — Inpatient Hospital Stay (HOSPITAL_COMMUNITY)
Admission: EM | Admit: 2017-06-01 | Discharge: 2017-06-04 | DRG: 308 | Disposition: A | Discharge status: Home Health | Payer: Medicare Other | Attending: Family Medicine | Admitting: Family Medicine

## 2017-06-01 DIAGNOSIS — Z7982 Long term (current) use of aspirin: Secondary | ICD-10-CM

## 2017-06-01 DIAGNOSIS — Z79899 Other long term (current) drug therapy: Secondary | ICD-10-CM

## 2017-06-01 DIAGNOSIS — Z992 Dependence on renal dialysis: Secondary | ICD-10-CM | POA: Diagnosis not present

## 2017-06-01 DIAGNOSIS — R791 Abnormal coagulation profile: Secondary | ICD-10-CM | POA: Diagnosis present

## 2017-06-01 DIAGNOSIS — I132 Hypertensive heart and chronic kidney disease with heart failure and with stage 5 chronic kidney disease, or end stage renal disease: Secondary | ICD-10-CM | POA: Diagnosis present

## 2017-06-01 DIAGNOSIS — R410 Disorientation, unspecified: Secondary | ICD-10-CM | POA: Diagnosis not present

## 2017-06-01 DIAGNOSIS — Z9114 Patient's other noncompliance with medication regimen: Secondary | ICD-10-CM

## 2017-06-01 DIAGNOSIS — I5032 Chronic diastolic (congestive) heart failure: Secondary | ICD-10-CM | POA: Diagnosis present

## 2017-06-01 DIAGNOSIS — Y841 Kidney dialysis as the cause of abnormal reaction of the patient, or of later complication, without mention of misadventure at the time of the procedure: Secondary | ICD-10-CM | POA: Diagnosis present

## 2017-06-01 DIAGNOSIS — M898X9 Other specified disorders of bone, unspecified site: Secondary | ICD-10-CM | POA: Diagnosis present

## 2017-06-01 DIAGNOSIS — D649 Anemia, unspecified: Secondary | ICD-10-CM | POA: Diagnosis present

## 2017-06-01 DIAGNOSIS — R64 Cachexia: Secondary | ICD-10-CM | POA: Diagnosis present

## 2017-06-01 DIAGNOSIS — N2581 Secondary hyperparathyroidism of renal origin: Secondary | ICD-10-CM | POA: Diagnosis present

## 2017-06-01 DIAGNOSIS — I4891 Unspecified atrial fibrillation: Secondary | ICD-10-CM | POA: Diagnosis present

## 2017-06-01 DIAGNOSIS — Z8701 Personal history of pneumonia (recurrent): Secondary | ICD-10-CM | POA: Diagnosis not present

## 2017-06-01 DIAGNOSIS — E8581 Light chain (AL) amyloidosis: Secondary | ICD-10-CM | POA: Diagnosis present

## 2017-06-01 DIAGNOSIS — T82838A Hemorrhage of vascular prosthetic devices, implants and grafts, initial encounter: Secondary | ICD-10-CM | POA: Diagnosis present

## 2017-06-01 DIAGNOSIS — N186 End stage renal disease: Secondary | ICD-10-CM

## 2017-06-01 DIAGNOSIS — F17210 Nicotine dependence, cigarettes, uncomplicated: Secondary | ICD-10-CM | POA: Diagnosis present

## 2017-06-01 DIAGNOSIS — I272 Pulmonary hypertension, unspecified: Secondary | ICD-10-CM | POA: Diagnosis present

## 2017-06-01 DIAGNOSIS — Z9221 Personal history of antineoplastic chemotherapy: Secondary | ICD-10-CM | POA: Diagnosis not present

## 2017-06-01 LAB — CBC WITH DIFFERENTIAL/PLATELET
BASOS ABS: 0 10*3/uL (ref 0.0–0.1)
Basophils Relative: 1 %
Eosinophils Absolute: 0.5 10*3/uL (ref 0.0–0.7)
Eosinophils Relative: 9 %
HEMATOCRIT: 34.6 % — AB (ref 39.0–52.0)
HEMOGLOBIN: 11.1 g/dL — AB (ref 13.0–17.0)
LYMPHS ABS: 1 10*3/uL (ref 0.7–4.0)
LYMPHS PCT: 19 %
MCH: 32.2 pg (ref 26.0–34.0)
MCHC: 32.1 g/dL (ref 30.0–36.0)
MCV: 100.3 fL — AB (ref 78.0–100.0)
Monocytes Absolute: 0.4 10*3/uL (ref 0.1–1.0)
Monocytes Relative: 7 %
NEUTROS ABS: 3.3 10*3/uL (ref 1.7–7.7)
Neutrophils Relative %: 64 %
Platelets: 162 10*3/uL (ref 150–400)
RBC: 3.45 MIL/uL — AB (ref 4.22–5.81)
RDW: 14 % (ref 11.5–15.5)
WBC: 5.1 10*3/uL (ref 4.0–10.5)

## 2017-06-01 LAB — PROTIME-INR
INR: 1.02
Prothrombin Time: 13.4 seconds (ref 11.4–15.2)

## 2017-06-01 LAB — TSH: TSH: 0.696 u[IU]/mL (ref 0.350–4.500)

## 2017-06-01 LAB — COMPREHENSIVE METABOLIC PANEL
ALBUMIN: 3.4 g/dL — AB (ref 3.5–5.0)
ALT: 7 U/L — AB (ref 17–63)
ANION GAP: 11 (ref 5–15)
AST: 13 U/L — ABNORMAL LOW (ref 15–41)
Alkaline Phosphatase: 48 U/L (ref 38–126)
BUN: 20 mg/dL (ref 6–20)
CHLORIDE: 102 mmol/L (ref 101–111)
CO2: 30 mmol/L (ref 22–32)
Calcium: 9.4 mg/dL (ref 8.9–10.3)
Creatinine, Ser: 8.73 mg/dL — ABNORMAL HIGH (ref 0.61–1.24)
GFR calc non Af Amer: 5 mL/min — ABNORMAL LOW (ref >60)
GFR, EST AFRICAN AMERICAN: 6 mL/min — AB (ref >60)
GLUCOSE: 82 mg/dL (ref 65–99)
Potassium: 3.9 mmol/L (ref 3.5–5.1)
SODIUM: 143 mmol/L (ref 135–145)
Total Bilirubin: 1 mg/dL (ref 0.3–1.2)
Total Protein: 6.2 g/dL — ABNORMAL LOW (ref 6.5–8.1)

## 2017-06-01 LAB — MAGNESIUM: MAGNESIUM: 2.6 mg/dL — AB (ref 1.7–2.4)

## 2017-06-01 LAB — PHOSPHORUS: Phosphorus: 6 mg/dL — ABNORMAL HIGH (ref 2.5–4.6)

## 2017-06-01 MED ORDER — CALCITRIOL 0.5 MCG PO CAPS
1.2500 ug | ORAL_CAPSULE | ORAL | Status: DC
  Administered 2017-06-02 – 2017-06-04 (×2): 1.25 ug via ORAL

## 2017-06-01 MED ORDER — CINACALCET HCL 30 MG PO TABS
30.0000 mg | ORAL_TABLET | Freq: Once | ORAL | Status: DC
  Filled 2017-06-01: qty 1

## 2017-06-01 MED ORDER — ACETAMINOPHEN 650 MG RE SUPP: 650.0000 mg | Freq: Four times a day (QID) | RECTAL | Status: DC | PRN

## 2017-06-01 MED ORDER — CALCIUM ACETATE (PHOS BINDER) 667 MG PO CAPS: 667.0000 mg | ORAL_CAPSULE | Freq: Three times a day (TID) | ORAL | Status: DC

## 2017-06-01 MED ORDER — SODIUM CHLORIDE 0.9 % IV SOLN
62.5000 mg | INTRAVENOUS | Status: DC
  Administered 2017-06-04: 62.5 mg via INTRAVENOUS
  Filled 2017-06-01 (×2): qty 5

## 2017-06-01 MED ORDER — METOPROLOL SUCCINATE ER 25 MG PO TB24
25.0000 mg | ORAL_TABLET | Freq: Every day | ORAL | Status: DC
  Administered 2017-06-03 – 2017-06-04 (×2): 25 mg via ORAL
  Filled 2017-06-01 (×2): qty 1

## 2017-06-01 MED ORDER — RENA-VITE PO TABS
1.0000 | ORAL_TABLET | Freq: Every day | ORAL | Status: DC
  Administered 2017-06-01 – 2017-06-03 (×2): 1 via ORAL
  Filled 2017-06-01 (×3): qty 1

## 2017-06-01 MED ORDER — METOPROLOL TARTRATE 5 MG/5ML IV SOLN
5.0000 mg | Freq: Once | INTRAVENOUS | Status: AC
  Administered 2017-06-01: 5 mg via INTRAVENOUS
  Filled 2017-06-01: qty 5

## 2017-06-01 MED ORDER — DILTIAZEM HCL 100 MG IV SOLR
5.0000 mg/h | INTRAVENOUS | Status: AC
  Administered 2017-06-02: 5 mg/h via INTRAVENOUS
  Filled 2017-06-01: qty 100

## 2017-06-01 MED ORDER — DILTIAZEM HCL 100 MG IV SOLR
5.0000 mg/h | INTRAVENOUS | Status: DC
  Administered 2017-06-01: 5 mg/h via INTRAVENOUS
  Filled 2017-06-01: qty 100

## 2017-06-01 MED ORDER — ACETAMINOPHEN 325 MG PO TABS: 650.0000 mg | ORAL_TABLET | Freq: Four times a day (QID) | ORAL | Status: DC | PRN

## 2017-06-01 MED ORDER — DILTIAZEM HCL ER COATED BEADS 120 MG PO CP24
120.0000 mg | ORAL_CAPSULE | Freq: Once | ORAL | Status: AC
  Administered 2017-06-01: 120 mg via ORAL
  Filled 2017-06-01 (×2): qty 1

## 2017-06-01 MED ORDER — CALCIUM ACETATE (PHOS BINDER) 667 MG PO CAPS
2001.0000 mg | ORAL_CAPSULE | Freq: Three times a day (TID) | ORAL | Status: DC
  Administered 2017-06-03 – 2017-06-04 (×5): 2001 mg via ORAL
  Filled 2017-06-01 (×5): qty 3

## 2017-06-01 MED ORDER — CALCITRIOL 0.5 MCG PO CAPS: 1.2500 ug | ORAL_CAPSULE | Freq: Once | ORAL | Status: AC

## 2017-06-01 MED ORDER — DILTIAZEM HCL ER 60 MG PO CP12
60.0000 mg | ORAL_CAPSULE | Freq: Once | ORAL | Status: AC
  Administered 2017-06-01: 60 mg via ORAL
  Filled 2017-06-01: qty 1

## 2017-06-01 MED ORDER — DILTIAZEM HCL ER COATED BEADS 120 MG PO CP24: 120.0000 mg | ORAL_CAPSULE | Freq: Every day | ORAL | Status: DC

## 2017-06-01 MED ORDER — IPRATROPIUM-ALBUTEROL 0.5-2.5 (3) MG/3ML IN SOLN: 3.0000 mL | Freq: Four times a day (QID) | RESPIRATORY_TRACT | Status: DC | PRN

## 2017-06-01 MED ORDER — CINACALCET HCL 30 MG PO TABS
30.0000 mg | ORAL_TABLET | ORAL | Status: DC
  Administered 2017-06-04: 30 mg via ORAL
  Filled 2017-06-01: qty 1

## 2017-06-01 MED ORDER — DILTIAZEM LOAD VIA INFUSION
10.0000 mg | Freq: Once | INTRAVENOUS | Status: AC
  Administered 2017-06-01: 10 mg via INTRAVENOUS
  Filled 2017-06-01 (×2): qty 10

## 2017-06-01 MED ORDER — ADULT MULTIVITAMIN W/MINERALS CH: 1.0000 | ORAL_TABLET | Freq: Every day | ORAL | Status: DC

## 2017-06-01 NOTE — ED Notes (Signed)
Ordered renal tray 

## 2017-06-01 NOTE — ED Provider Notes (Signed)
Emergency Department Provider Note   I have reviewed the triage vital signs and the nursing notes.   HISTORY  Chief Complaint Vascular Access Problem   HPI Shane [name] is a 76 y.o. male with PMH of ESRD on HD and HTN presents to the ED for evaluation of bleeding vascular access. The patient presented to dialysis this morning and had his fistula accessed. They noticed elevated heart rate and so decided to stop dialysis. They deaccessed his fistula and had significant bleeding. EMS report that staff held pressure on the fistula for approximately 25 minutes. When EMS arrived he still had heavy bleeding and they continued to hold pressure for a total of 1 hour upon ED presentation. Patient denies any symptoms at this time. No fevers or chills. He states he sometimes has elevated heart rate when "my heart is acting up."    Past Medical History:  Diagnosis Date  . 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 Arthritis    bursitis in hip  . Bilateral inguinal hernia (BIH)    right > left side  . 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 Reynolds Bowl  . Elevated troponin 04/12/2015  . Full dentures   . Hypertension   . Tobacco abuse   . Urinary tract infection due to Enterococcus 08/03/2015  . Varicocele     Patient Active Problem List   Diagnosis Date Noted  . Atrial fibrillation (HCC) 06/01/2017  . Atrial fibrillation with RVR (HCC) 06/29/2016  . Adjustment disorder with other symptom   . Atrial fibrillation with rapid ventricular response (HCC) 06/28/2016  . Toxic metabolic encephalopathy 12/01/2015  . Bilateral inguinal hernia (BIH) s/p lap repair w mesh 11/25/2015 11/25/2015  . Hydrocele s/p partial rexction/unroofing 11/25/2015 11/25/2015  . 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 Pulmonary hypertension (HCC) 08/04/2015  . Essential hypertension   .  Acute encephalopathy 04/12/2015  . CKD (chronic kidney disease) stage V requiring chronic dialysis (HCC) 04/12/2015  . Anorexia 04/12/2015  . Amyloidosis (HCC) 04/12/2015  . Uremia 04/12/2015  . Tobacco abuse 02/09/2013  . Hypertension 02/09/2013    Past Surgical History:  Procedure Laterality Date  . AV FISTULA PLACEMENT, BRACHIOCEPHALIC  10/19/2010   LEFT  . AV FISTULA PLACEMENT, RADIOCEPHALIC  01/30/2011   Right w/ ligation of competing venous branch by Dr. Leonides Sake  . AV FISTULA PLACEMENT, RADIOCEPHALIC  09/13/2010   LEFT  . BASCILIC VEIN TRANSPOSITION Left 04/08/2015   Procedure: BASILIC VEIN TRANSPOSITION;  Surgeon: Pryor Ochoa, MD;  Location: [facility] OR;  Service: Vascular;  Laterality: Left;  . COLONOSCOPY    . INGUINAL HERNIA REPAIR Bilateral 11/25/2015   Procedure: LAPAROSCOPIC BILATERAL INGUINAL HERNIA REPAIR--EXCISION OF RIGHT HYDROCELE;  Surgeon: Karie Soda, MD;  Location: [facility] OR;  Service: General;  Laterality: Bilateral;  . INSERTION OF MESH Bilateral 11/25/2015   Procedure: INSERTION OF MESH;  Surgeon: Karie Soda, MD;  Location: [facility] OR;  Service: General;  Laterality: Bilateral;  . MULTIPLE TOOTH EXTRACTIONS      Current Outpatient Rx  . Order #: [id number] Class: Historical Med  . Order #: [id number] Class: Historical Med  . Order #: [id number] Class: Historical Med  . Order #: [id number] Class: Normal  . Order #: [id number] Class: Print  . Order #: [id number] Class: Print  . Order #: [id number] Class: Normal  . Order #: [id number] Class: Normal  . Order #: [id number] Class: Print  .  Order #: [id number] Class: Normal    Allergies Patient has no known allergies.  Family History  Problem Relation Age of Onset  . Asthma Mother   . Hypertension Mother   . Alcohol abuse Father   . Hypertension Father   . Hypertension Sister   . Diabetes Sister   . Heart disease Sister   . Hypertension Brother   . Peripheral vascular disease Brother   . Hypertension Daughter   . Heart  disease Daughter     Social History Social History  Substance Use Topics  . Smoking status: Current Every Day Smoker    Packs/day: 1.00    Years: 50.00    Types: Cigarettes  . Smokeless tobacco: Never Used  . Alcohol use No    Review of Systems  Constitutional: No fever/chills Eyes: No visual changes. ENT: No sore throat. Cardiovascular: Denies chest pain. Positive vascular access bleeding.  Respiratory: Denies shortness of breath. Gastrointestinal: No abdominal pain. No nausea, no vomiting. No diarrhea. No constipation. Genitourinary: Negative for dysuria. Musculoskeletal: Negative for back pain. Skin: Negative for rash. Neurological: Negative for headaches, focal weakness or numbness.  10-point ROS otherwise negative.  ____________________________________________   PHYSICAL EXAM:  VITAL SIGNS: Vitals:   06/01/17 1346 06/01/17 1400  BP: 112/83 98/82  Pulse: (!) 59 67  Resp:    Temp:    SpO2: 95% 94%   Constitutional: Alert and oriented. Well appearing and in no acute distress. Eyes: Conjunctivae are normal.  Head: Atraumatic. Nose: No congestion/rhinnorhea. Mouth/Throat: Mucous membranes are moist. Neck: No stridor.  Cardiovascular: Normal rate, regular rhythm. Good peripheral circulation. Grossly normal heart sounds. No oozing or active bleeding from left arm AV-fistula.  Respiratory: Normal respiratory effort.  No retractions. Lungs CTAB. Gastrointestinal: Soft and nontender. No distention.  Musculoskeletal: No lower extremity tenderness nor edema. No gross deformities of extremities. Neurologic:  Normal speech and language. No gross focal neurologic deficits are appreciated.  Skin:  Skin is warm, dry and intact. No rash noted.  ____________________________________________   LABS (all labs ordered are listed, but only abnormal results are displayed)  Labs Reviewed  COMPREHENSIVE METABOLIC PANEL - Abnormal; Notable for the following:       Result Value    Creatinine, Ser 8.73 (*)    Total Protein 6.2 (*)    Albumin 3.4 (*)    AST 13 (*)    ALT 7 (*)    GFR calc non Af Amer 5 (*)    GFR calc Af Amer 6 (*)    All other components within normal limits  CBC WITH DIFFERENTIAL/PLATELET - Abnormal; Notable for the following:    RBC 3.45 (*)    Hemoglobin 11.1 (*)    HCT 34.6 (*)    MCV 100.3 (*)    All other components within normal limits  PROTIME-INR  CBC WITH DIFFERENTIAL/PLATELET   ____________________________________________  EKG   EKG Interpretation  Date/Time:  Friday June 01 2017 08:16:24 EDT Ventricular Rate:  140 PR Interval:    QRS Duration: 172 QT Interval:  342 QTC Calculation: 522 R Axis:   -71 Text Interpretation:  Sinus tachycardia No STEMI.  Confirmed by Alona Bene [phone]) on 06/01/2017 8:20:10 AM       ____________________________________________  RADIOLOGY  None ____________________________________________   PROCEDURES  Procedure(s) performed:   Procedures  CRITICAL CARE Performed by: Maia Plan Total critical care time: 35 minutes Critical care time was exclusive of separately billable procedures and treating other patients. Critical care was necessary to  treat or prevent imminent or life-threatening deterioration. Critical care was time spent personally by me on the following activities: development of treatment plan with patient and/or surrogate as well as nursing, discussions with consultants, evaluation of patient's response to treatment, examination of patient, obtaining history from patient or surrogate, ordering and performing treatments and interventions, ordering and review of laboratory studies, ordering and review of radiographic studies, pulse oximetry and re-evaluation of patient's condition.  Alona Bene, MD Emergency Medicine   ____________________________________________   INITIAL IMPRESSION / ASSESSMENT AND PLAN / ED COURSE  Pertinent labs & imaging results that  were available during my care of the patient were reviewed by me and considered in my medical decision making (see chart for details).  Patient presents to the emergency department for evaluation of bleeding vascular access. I removed the dressing and the fistula is no longer bleeding. I placed a loose dressing on and will obtain labs and EKG.   Bleeding controlled. Patient continues to have A. fib RVR after multiple metoprolol boluses and restarting his 24-hour diltiazem tablet. Patient became very symptomatic with ambulation to the restroom with heart rate to the 180s and near syncope.  12:28 PM Patient with continued a-fib with RVR. Started Diltiazem infusion.  Discussed patient's case with Family Medicine to request admission. Patient and family (if present) updated with plan. Care transferred to [facility] Medicine service.  I reviewed all nursing notes, vitals, pertinent old records, EKGs, labs, imaging (as available).   ____________________________________________  FINAL CLINICAL IMPRESSION(S) / ED DIAGNOSES  Final diagnoses:  Bleeding from dialysis shunt, initial encounter (HCC)  Atrial fibrillation with RVR (HCC)  ESRD on dialysis [facility])     MEDICATIONS GIVEN DURING THIS VISIT:  Medications  diltiazem (CARDIZEM) 1 mg/mL load via infusion 10 mg (10 mg Intravenous Bolus from Bag 06/01/17 1252)    And  diltiazem (CARDIZEM) 100 mg in dextrose 5 % 100 mL (1 mg/mL) infusion (5 mg/hr Intravenous New Bag/Given 06/01/17 1255)  diltiazem (CARDIZEM CD) 24 hr capsule 120 mg (120 mg Oral Given 06/01/17 0943)  metoprolol tartrate (LOPRESSOR) injection 5 mg (5 mg Intravenous Given 06/01/17 0943)  metoprolol tartrate (LOPRESSOR) injection 5 mg (5 mg Intravenous Given 06/01/17 1028)     NEW OUTPATIENT MEDICATIONS STARTED DURING THIS VISIT:  None   Note:  This document was prepared using Dragon voice recognition software and may include unintentional dictation errors.  Alona Bene,  MD Emergency Medicine    Jinnie Onley, Arlyss Repress, MD 06/01/17 [phone]

## 2017-06-01 NOTE — ED Notes (Signed)
Pt ambulated to restroom. When returning Pt got SOB and requested to stop. Did not tolerate well. This tech suggested a wheel chair. Pt back in bed heart rate 180 bpm. RN and MD notified. Pt on monitors and resting comfortably

## 2017-06-01 NOTE — H&P (Signed)
Family Medicine Teaching [facility] Admission History and Physical Service Pager: [phone]  Patient name: Shane [name] Medical record number: [id number] Date of birth: April 21, 1941 Age: 76 y.o. Gender: male  Primary Care Provider: [name], MD Consultants: none Code Status: full  Chief Complaint: afib w/ rvr and bleeding from fistula  Assessment and Plan: Shane [name] is a 76 y.o. male presenting with Afib w/ rvr (140s) and bleeding from a HD fistula that took 1hr to stop 8/24. PMH is significant for ESRD on HD, HTN, Afib, amyloidosis and HFpEF(EF 55-60%, G2DD 06/2016)   Afib with RVR  - HTN to 150s/110 in the ED, rate ~140 unable to control with metoprolol and PO dilt in the ED, and so started on diltaizem gtt.  history of afib on dilt 120 mg qd and toprol 25 mg daily at home. Uncertain compliance with medications but patient thinks he has been off for more than a month.  Patient is not chronically anticoagulated due to concerns about compliance on coumadin, not a NOAC candidate due to CRF per previous cardiology notes. He was supposed to be on ASA 325 mg daily at home. Expect given his prompt improving on low dose dilt drip that he can be converted to PO and discharged soon.  Social concerns for med compliance are significant.    On dilt drip @5ml  his rate was controlled to ~90 at rest - admit to FPTS stepdown (due to dilt drip), attending Dr. 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 MWF HD days. Last HD was Wed 8/22. Stopped HD this AM due to afib with RVR and bleeding at fistula site. - consult to nephro for  HD, patient missed the Friday session - patient on the list for HD 8/25  AV fistular bleed: Signed out by ED physician. Per report, patient had notable bleeding from fistula site at HD today. Compression was held throughout transport to the ED and in the ED. He was finally well-controlled by the time we evaluated him in the ED. - hold ASA for today, restart tomorrow - patient is not chronically anticoagulated - monitor AM hgb given this history of bleed  HTN, currently well controlled - 118/88 on admission. Home meds include Toprol 25 mg daily as noted above.  - continue home toprol   Amyloidosis -patient not taking any meds for this currently  Social- Concern that he is not mentally capable of tracking his medications - will discuss with patient and family need for more help with medication  FEN/GI: renal, fluid restriction Prophylaxis: SCDs because of prior bleed today, no pharmocologic  Disposition: admit to telemetry  History of Present Illness:  Shane [name] is a 76 y.o. male presenting with presenting with afib w/ rvr discovered at HD today in [place], when they went to DC his HD he also had issues stopping his bleeding from the fistula and per the patient it bled for an hour on the way to the ED at [facility]. He has issues with his memory and would get confused often during our interview but he thinks  he was probably off his meds for more than one month.  Patient denies symptoms of dizziness, chest pain, or dyspnea at rest. He notes dyspnea with getting up to use the bathroom but feels this is his baseline. He denies PND or leg swelling. He denies nausea or vomiting. Denies diarrhea, constipation. He makes a tiny bit of urine but has not noted pain or hematuria with urination.    Of note: patient had significant bleeding from his fistula site at dialysis that was difficult to control. He was hemodynamically stable at the time of admission, and bleeding had stopped.  In the ED:  Patient was given diltiazem 120 mg, and lopressor 5 mg injection x2 without improvement in rate. He was started on a diltiazem drip and we were called for admission for wean from dilt gtt and observation overnight.  Review Of Systems: Per HPI with the following additions:   ROS  Patient Active Problem List   Diagnosis Date Noted  . Atrial fibrillation (HCC) 06/01/2017  . Atrial fibrillation with RVR (HCC) 06/29/2016  . Adjustment disorder with other symptom   . Atrial fibrillation with rapid ventricular response (HCC) 06/28/2016  . Toxic metabolic encephalopathy 12/01/2015  . Bilateral inguinal hernia (BIH) s/p lap repair w mesh 11/25/2015 11/25/2015  . Hydrocele s/p partial rexction/unroofing 11/25/2015 11/25/2015  . 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 Pulmonary hypertension (HCC) 08/04/2015  . Essential hypertension   . Acute encephalopathy 04/12/2015  . CKD (chronic kidney disease) stage V requiring chronic dialysis (HCC) 04/12/2015  . Anorexia 04/12/2015  . Amyloidosis (HCC) 04/12/2015  . Uremia 04/12/2015  . Tobacco abuse 02/09/2013  . Hypertension 02/09/2013    Past Medical History: Past Medical History:  Diagnosis Date  . 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 Arthritis    bursitis in hip  . Bilateral inguinal hernia (BIH)    right > left side  . 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 Reynolds Bowl  . Elevated troponin 04/12/2015  . Full dentures   . Hypertension   . Tobacco abuse   . Urinary tract infection due to Enterococcus 08/03/2015  . Varicocele     Past Surgical History: Past Surgical History:  Procedure Laterality Date  . AV FISTULA PLACEMENT, BRACHIOCEPHALIC  10/19/2010   LEFT  . AV FISTULA PLACEMENT, RADIOCEPHALIC  01/30/2011   Right w/ ligation of competing venous branch by Dr. Leonides Sake  . AV FISTULA PLACEMENT,  RADIOCEPHALIC  09/13/2010   LEFT  . BASCILIC VEIN TRANSPOSITION Left 04/08/2015   Procedure: BASILIC VEIN TRANSPOSITION;  Surgeon: Pryor Ochoa, MD;  Location: [facility] OR;  Service: Vascular;  Laterality: Left;  . COLONOSCOPY    . INGUINAL HERNIA REPAIR Bilateral 11/25/2015   Procedure: LAPAROSCOPIC BILATERAL INGUINAL HERNIA REPAIR--EXCISION OF RIGHT HYDROCELE;  Surgeon: Karie Soda, MD;  Location: [facility] OR;  Service: General;  Laterality: Bilateral;  . INSERTION OF MESH Bilateral 11/25/2015   Procedure: INSERTION OF MESH;  Surgeon: Karie Soda, MD;  Location: [facility] OR;  Service: General;  Laterality: Bilateral;  . MULTIPLE TOOTH EXTRACTIONS      Social History: Social History  Substance Use Topics  . Smoking status: Current Every Day Smoker    Packs/day: 1.00    Years: 60.00    Types: Cigarettes  . Smokeless tobacco: Never Used  . Alcohol use No    Family History: Family  History  Problem Relation Age of Onset  . Asthma Mother   . Hypertension Mother   . Alcohol abuse Father   . Hypertension Father   . Hypertension Sister   . Diabetes Sister   . Heart disease Sister   . Hypertension Brother   . Peripheral vascular disease Brother   . Hypertension Daughter   . Heart disease Daughter     Allergies and Medications: No Known Allergies No current facility-administered medications on file prior to encounter.    Current Outpatient Prescriptions on File Prior to Encounter  Medication Sig Dispense Refill  . ammonium lactate (AMLACTIN) 12 % cream Apply 1 application topically daily.    . betamethasone valerate (VALISONE) 0.1 % cream Apply 1 application topically daily.    . calcium acetate (PHOSLO) 667 MG capsule Take 2,001 mg by mouth 3 (three) times daily with meals.     Marland Kitchen diltiazem (CARDIZEM CD) 120 MG 24 hr capsule Take 1 capsule (120 mg total) by mouth daily. 30 capsule 0  . doxycycline (VIBRAMYCIN) 100 MG capsule Take 1 capsule (100 mg total) by mouth 2 (two) times daily. 20  capsule 0  . metoprolol succinate (TOPROL-XL) 25 MG 24 hr tablet Take 1 tablet (25 mg total) by mouth daily. 30 tablet 0  . albuterol-ipratropium (COMBIVENT) 18-103 MCG/ACT inhaler Inhale 2 puffs into the lungs every 6 (six) hours as needed for wheezing. (Patient not taking: Reported on 04/23/2017) 1 Inhaler 6  . aspirin EC 325 MG tablet Take 1 tablet (325 mg total) by mouth daily. Your may choose to take baby aspirin (81mg ) three tabs daily, instead the 325mg  tabs daily. (Patient not taking: Reported on 04/23/2017) 30 tablet 0  . Nutritional Supplements (FEEDING SUPPLEMENT, NEPRO CARB STEADY,) LIQD Take 237 mLs by mouth 2 (two) times daily between meals. (Patient not taking: Reported on 04/23/2017) 60 Can 0  . traMADol (ULTRAM) 50 MG tablet Take 1-2 tablets (50-100 mg total) by mouth every 6 (six) hours as needed for moderate pain or severe pain. (Patient not taking: Reported on 04/23/2017) 40 tablet 0    Objective: BP (!) 116/97   Pulse (!) 109   Temp 99.5 F (37.5 C) (Temporal)   Resp (!) 22   SpO2 (!) 84%  Exam: General: pleasant but distracted, talkative in NAD Eyes: EOMI, PERRLA ENTM: mmm, no nasal discharge Neck: FROM Cardiovascular: irregular rhythm about 90, no murmurs noted but auscultation was difficult in the ED as there might have been referred sounds from fistula, +1 pitting edema to LE bilaterally Respiratory: diffuse wheezing but no visible IWB, patient appeared comfortable on room air Gastrointestinal: soft belly TTP, bowel sounds throughout MSK: no gross deficits noted but we did not walk test the patient as he had expressed weakness when ambulating earlier. Derm: no lesions noted Neuro: CN exam with no noted deficits Psych: patient was AOx3 and pleasant but more than once got confused by conversation and admitted memory issues  Labs and Imaging: CBC BMET   Recent Labs Lab 06/01/17 0945  WBC 5.1  HGB 11.1*  HCT 34.6*  PLT 162    Recent Labs Lab 06/01/17 0820   NA 143  K 3.9  CL 102  CO2 30  BUN 20  CREATININE 8.73*  GLUCOSE 82  CALCIUM 9.4      Marthenia Rolling, DO 06/01/2017, 3:58 PM PGY-1, [place] [facility] FPTS Intern pager: [phone], text pages welcome  I saw and evaluated the patient alongside FPTS intern. I helped write  and edited the above note, and I agree with the above documentation in its edited form. Some of my additions are in [place].  Howard Pouch, MD PGY-2 [facility]

## 2017-06-01 NOTE — Progress Notes (Signed)
CALL PAGER [phone] for any questions or notifications regarding this patient  FMTS Attending Note: Denny Levy MD Patient may have food. No fluid restrictions at this time.Full orders to follow.

## 2017-06-01 NOTE — ED Notes (Signed)
Report give to [facility] on 3W, pt can be transported up to 3W after 7:30

## 2017-06-01 NOTE — ED Triage Notes (Signed)
Pt arrives via EMS from dialysis center where pt arrived for scheduled dialysis appt. Per facility pts HR elevated in 130s, with elevated bp, 158/115. Center unable to stop bleeding in left arm fistula after deacessing. On arrival pt awake, alert, bleeding controlled.

## 2017-06-01 NOTE — ED Notes (Signed)
Call to 3W, RN to call back

## 2017-06-01 NOTE — ED Notes (Signed)
Call to 3W 

## 2017-06-01 NOTE — Consult Note (Signed)
[facility] Renal Consultation Note    Indication for Consultation:  Management of ESRD/hemodialysis; anemia, hypertension/volume and secondary hyperparathyroidism PCP: Anna Genre, [place] [place]  HPI: Shane [name] is a 76 y.o. male with ESRD (MWF [facility]) secondary to AL amyloidosis on HD since October 2016 who lives with his wife and presented to his HD unit in usual state of health.  HR was found to be elevated in the 130 when VS were checked after his access was cannulated.  He had no dialysis nor received any heparin but it took 25 minutes to stop bleeding after needles were pulled when it usually times < 15 minutes.   He was emergently transported to [facility] ED for treatment.  Records indicate he has had an admission for rapid afib 06/2016 and several ED presentations in July - once for sinus tach and the other rapid afib.  Last September he was started on Cardizem CR 120 (last refill 06/2016) per day which he has failed to continue along with metoprolol succinate 50 mg. He declined coumadin 06/2016.   His refill records from the dialysis unit indicated that he refilled metoprolol succinate 25 mg on 04/23/17.  He also was started on Augmentin and doxycycline 7/16 for LLL PNA.  At present he denies SOB, CP, cough.  He said he couldn't tell that his heart rate was elevated today.  He denies falls and dizziness at home but his dialysis RN told me today that this has been occurring and he has reported list to her. He tells me that he does not take any meds which I suspect for the most part is true. HR here 130 - 140 initially now down to 60s but still in afib.  Initial  EKG here showed ST no STEMI per EDP note.. Initial temp is 99.5   He has been leaving consistently below his EDW and his EDW was lowered recently 1kg to 65. Usual HR at outpatient HD is 75 - 90s range. BP well controlled without consistent meds.  Today K is 3.9 hgb 11.1 WBC 5.1 with normal diff.  He has not had a CXR.  He continues to smoke  and assures me he will continue after discharge.  Past Medical History:  Diagnosis Date  . 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 Arthritis    bursitis in hip  . Bilateral inguinal hernia (BIH)    right > left side  . 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 Reynolds Bowl  . Elevated troponin 04/12/2015  . Full dentures   . Hypertension   . Tobacco abuse   . Urinary tract infection due to Enterococcus 08/03/2015  . Varicocele    Past Surgical History:  Procedure Laterality Date  . AV FISTULA PLACEMENT, BRACHIOCEPHALIC  10/19/2010   LEFT  . AV FISTULA PLACEMENT, RADIOCEPHALIC  01/30/2011   Right w/ ligation of competing venous branch by Dr. Leonides Sake  . AV FISTULA PLACEMENT, RADIOCEPHALIC  09/13/2010   LEFT  . BASCILIC VEIN TRANSPOSITION Left 04/08/2015   Procedure: BASILIC VEIN TRANSPOSITION;  Surgeon: Pryor Ochoa, MD;  Location: [facility] OR;  Service: Vascular;  Laterality: Left;  . COLONOSCOPY    . INGUINAL HERNIA REPAIR Bilateral 11/25/2015   Procedure: LAPAROSCOPIC BILATERAL INGUINAL HERNIA REPAIR--EXCISION OF RIGHT HYDROCELE;  Surgeon: Karie Soda, MD;  Location: [facility] OR;  Service: General;  Laterality: Bilateral;  . INSERTION OF MESH Bilateral  11/25/2015   Procedure: INSERTION OF MESH;  Surgeon: Karie Soda, MD;  Location: [facility] OR;  Service: General;  Laterality: Bilateral;  . MULTIPLE TOOTH EXTRACTIONS     Family History  Problem Relation Age of Onset  . Asthma Mother   . Hypertension Mother   . Alcohol abuse Father   . Hypertension Father   . Hypertension Sister   . Diabetes Sister   . Heart disease Sister   . Hypertension Brother   . Peripheral vascular disease Brother   . Hypertension Daughter   . Heart disease Daughter    Social History:  reports that he has been smoking Cigarettes.  He has a 50.00 pack-year smoking history. He has never used smokeless tobacco. He reports  that he does not drink alcohol or use drugs. No Known Allergies Prior to Admission medications   Medication Sig Start Date End Date Taking? Authorizing Provider  ammonium lactate (AMLACTIN) 12 % cream Apply 1 application topically daily. 03/20/17  Yes [provider]  betamethasone valerate (VALISONE) 0.1 % cream Apply 1 application topically daily. 03/22/17  Yes [provider]  calcium acetate (PHOSLO) 667 MG capsule Take 2,001 mg by mouth 3 (three) times daily with meals.    Yes [provider]  diltiazem (CARDIZEM CD) 120 MG 24 hr capsule Take 1 capsule (120 mg total) by mouth daily. 06/29/16  Yes Albertine Grates, MD  doxycycline (VIBRAMYCIN) 100 MG capsule Take 1 capsule (100 mg total) by mouth 2 (two) times daily. 04/23/17  Yes Shaune Pollack, MD  metoprolol succinate (TOPROL-XL) 25 MG 24 hr tablet Take 1 tablet (25 mg total) by mouth daily. 04/23/17  Yes Shaune Pollack, MD  albuterol-ipratropium (COMBIVENT) 18-103 MCG/ACT inhaler Inhale 2 puffs into the lungs every 6 (six) hours as needed for wheezing. Patient not taking: Reported on 04/23/2017 02/11/13   Genelle Gather, MD  aspirin EC 325 MG tablet Take 1 tablet (325 mg total) by mouth daily. Your may choose to take baby aspirin (81mg ) three tabs daily, instead the 325mg  tabs daily. Patient not taking: Reported on 04/23/2017 06/29/16   Albertine Grates, MD  Nutritional Supplements (FEEDING SUPPLEMENT, NEPRO CARB STEADY,) LIQD Take 237 mLs by mouth 2 (two) times daily between meals. Patient not taking: Reported on 04/23/2017 12/01/15   Maretta Bees, MD  traMADol (ULTRAM) 50 MG tablet Take 1-2 tablets (50-100 mg total) by mouth every 6 (six) hours as needed for moderate pain or severe pain. Patient not taking: Reported on 04/23/2017 11/25/15   Karie Soda, MD   Current Facility-Administered Medications  Medication Dose Route Frequency Provider Last Rate Last Dose  . diltiazem (CARDIZEM) 100 mg in dextrose 5 % 100 mL (1 mg/mL)  infusion  5-15 mg/hr Intravenous Continuous Long, Arlyss Repress, MD 5 mL/hr at 06/01/17 1255 5 mg/hr at 06/01/17 1255   Current Outpatient Prescriptions  Medication Sig Dispense Refill  . ammonium lactate (AMLACTIN) 12 % cream Apply 1 application topically daily.    . betamethasone valerate (VALISONE) 0.1 % cream Apply 1 application topically daily.    . calcium acetate (PHOSLO) 667 MG capsule Take 2,001 mg by mouth 3 (three) times daily with meals.     Marland Kitchen diltiazem (CARDIZEM CD) 120 MG 24 hr capsule Take 1 capsule (120 mg total) by mouth daily. 30 capsule 0  . doxycycline (VIBRAMYCIN) 100 MG capsule Take 1 capsule (100 mg total) by mouth 2 (two) times daily. 20 capsule 0  . metoprolol succinate (TOPROL-XL) 25 MG 24 hr  tablet Take 1 tablet (25 mg total) by mouth daily. 30 tablet 0  . albuterol-ipratropium (COMBIVENT) 18-103 MCG/ACT inhaler Inhale 2 puffs into the lungs every 6 (six) hours as needed for wheezing. (Patient not taking: Reported on 04/23/2017) 1 Inhaler 6  . aspirin EC 325 MG tablet Take 1 tablet (325 mg total) by mouth daily. Your may choose to take baby aspirin (81mg ) three tabs daily, instead the 325mg  tabs daily. (Patient not taking: Reported on 04/23/2017) 30 tablet 0  . Nutritional Supplements (FEEDING SUPPLEMENT, NEPRO CARB STEADY,) LIQD Take 237 mLs by mouth 2 (two) times daily between meals. (Patient not taking: Reported on 04/23/2017) 60 Can 0  . traMADol (ULTRAM) 50 MG tablet Take 1-2 tablets (50-100 mg total) by mouth every 6 (six) hours as needed for moderate pain or severe pain. (Patient not taking: Reported on 04/23/2017) 40 tablet 0   Labs: Basic Metabolic Panel:  Recent Labs Lab 06/01/17 0820  NA 143  K 3.9  CL 102  CO2 30  GLUCOSE 82  BUN 20  CREATININE 8.73*  CALCIUM 9.4   Liver Function Tests:  Recent Labs Lab 06/01/17 0820  AST 13*  ALT 7*  ALKPHOS 48  BILITOT 1.0  PROT 6.2*  ALBUMIN 3.4*   CBC:  Recent Labs Lab 06/01/17 0945  WBC 5.1  NEUTROABS  3.3  HGB 11.1*  HCT 34.6*  MCV 100.3*  PLT 162    ROS: As per HPI otherwise negative.  Physical Exam: Vitals:   06/01/17 1245 06/01/17 1331 06/01/17 1346 06/01/17 1400  BP: (!) 129/118 105/79 112/83 98/82  Pulse:   (!) 59 67  Resp:      Temp:      TempSrc:      SpO2:   95% 94%     General: WDWN NAD elderly gentleman Head: NCAT sclera not icteric MMM Neck: Supple.  Lungs: CTA bilaterally without wheezes, rales, or rhonchi. Breathing is unlabored. Heart:  irreg - afib on tele Abdomen: soft NT + BS Lower extremities:without edema or ischemic changes, no open wounds  Neuro: A & O  X 3. Moves all extremities spontaneously. Psych:  Responds to questions appropriately with a normal affect.- minimizes situation Dialysis Access: left upper AVF + bruit  Dialysis Orders: [facility] MWF 4 hr 2 K 2.25 Ca EDW 65left upper AVF  heparin 2000 venofer 50 q Wed, Mircera 50 q 4 weeks - last 8/22 Calcitriol 1.25 sensipar 30 MWF with HD  Recent labs: iPTH 361 hgb 10.6 23% sat l  Assessment/Plan: 1. Afib with RVR - nonresponsive to MTP boluses - in ED - started on cardizem drip and HR came down;  compliance with meds after d/c will be a challenge. HR down now. He has refused coumadin for this in the past.  2. ESRD -  MWF - no need for acute HD today. Plan HD Saturday and back on schedule Monday K 3.9 - avoid low K 3. Hypertension/volume  - BP lowish on cardizem drip but HR down - may need to raise EDW back up to support BP if he actually takes rate controlling meds. 4. Anemia  - hgb 11.1  5. Metabolic bone disease -  On Hectorol and sensipar with HD - noncompliant with binders - should be on 1 phoslo ac 6. Nutrition - NPO for now -  7. Tobacco abuse - no desire to stop smoking. 8. Noncompliance with meds - this is a significant issue -  9. Prolonged bleeding from access - suspect  related to elevated heart rate.  Will see how he does post HD Saturday. 10.  Low grade temp - normal WBC and diff - hx PNA  04/2017 - consider repeat CXR  Sheffield Slider, PA-C [facility] Beeper [phone] 06/01/2017, 2:29 PM   Pt seen, examined, agree w assess/plan as above with additions as indicated.  Vinson Moselle MD [employer] pager [phone]    cell [phone] 06/01/2017, 4:17 PM

## 2017-06-02 ENCOUNTER — Inpatient Hospital Stay (HOSPITAL_COMMUNITY): Payer: Medicare Other

## 2017-06-02 LAB — CBC
HCT: 34 % — ABNORMAL LOW (ref 39.0–52.0)
Hemoglobin: 10.7 g/dL — ABNORMAL LOW (ref 13.0–17.0)
MCH: 31.4 pg (ref 26.0–34.0)
MCHC: 31.5 g/dL (ref 30.0–36.0)
MCV: 99.7 fL (ref 78.0–100.0)
PLATELETS: 150 10*3/uL (ref 150–400)
RBC: 3.41 MIL/uL — AB (ref 4.22–5.81)
RDW: 14.1 % (ref 11.5–15.5)
WBC: 6.3 10*3/uL (ref 4.0–10.5)

## 2017-06-02 LAB — BASIC METABOLIC PANEL
Anion gap: 14 (ref 5–15)
BUN: 30 mg/dL — ABNORMAL HIGH (ref 6–20)
CALCIUM: 8.9 mg/dL (ref 8.9–10.3)
CO2: 27 mmol/L (ref 22–32)
CREATININE: 10.32 mg/dL — AB (ref 0.61–1.24)
Chloride: 102 mmol/L (ref 101–111)
GFR, EST AFRICAN AMERICAN: 5 mL/min — AB (ref >60)
GFR, EST NON AFRICAN AMERICAN: 4 mL/min — AB (ref >60)
Glucose, Bld: 92 mg/dL (ref 65–99)
Potassium: 3.8 mmol/L (ref 3.5–5.1)
SODIUM: 143 mmol/L (ref 135–145)

## 2017-06-02 LAB — MRSA PCR SCREENING: MRSA BY PCR: NEGATIVE

## 2017-06-02 MED ORDER — LIDOCAINE-PRILOCAINE 2.5-2.5 % EX CREA
1.0000 "application " | TOPICAL_CREAM | CUTANEOUS | Status: DC | PRN
  Filled 2017-06-02: qty 5

## 2017-06-02 MED ORDER — LIDOCAINE HCL (PF) 1 % IJ SOLN: 5.0000 mL | INTRAMUSCULAR | Status: DC | PRN

## 2017-06-02 MED ORDER — LORAZEPAM 0.5 MG PO TABS: 0.5000 mg | ORAL_TABLET | Freq: Once | ORAL | Status: DC

## 2017-06-02 MED ORDER — LORAZEPAM 2 MG/ML IJ SOLN
0.5000 mg | Freq: Once | INTRAMUSCULAR | Status: DC
  Filled 2017-06-02: qty 1

## 2017-06-02 MED ORDER — CALCITRIOL 0.5 MCG PO CAPS
ORAL_CAPSULE | ORAL | Status: AC
  Filled 2017-06-02: qty 2

## 2017-06-02 MED ORDER — LORAZEPAM 2 MG/ML IJ SOLN
INTRAMUSCULAR | Status: AC
  Filled 2017-06-02: qty 1

## 2017-06-02 MED ORDER — HEPARIN SODIUM (PORCINE) 1000 UNIT/ML DIALYSIS: 1000.0000 [IU] | INTRAMUSCULAR | Status: DC | PRN

## 2017-06-02 MED ORDER — LORAZEPAM 2 MG/ML IJ SOLN: 0.5000 mg | Freq: Once | INTRAMUSCULAR | Status: DC

## 2017-06-02 MED ORDER — LORAZEPAM 2 MG/ML IJ SOLN
0.5000 mg | Freq: Once | INTRAMUSCULAR | Status: AC
  Administered 2017-06-02: 0.5 mg via INTRAMUSCULAR
  Filled 2017-06-02: qty 1

## 2017-06-02 MED ORDER — SODIUM CHLORIDE 0.9 % IV SOLN: 100.0000 mL | INTRAVENOUS | Status: DC | PRN

## 2017-06-02 MED ORDER — HEPARIN SODIUM (PORCINE) 1000 UNIT/ML DIALYSIS: 20.0000 [IU]/kg | INTRAMUSCULAR | Status: DC | PRN

## 2017-06-02 MED ORDER — ASPIRIN 325 MG PO TABS
325.0000 mg | ORAL_TABLET | Freq: Every day | ORAL | Status: DC
  Administered 2017-06-03 – 2017-06-04 (×2): 325 mg via ORAL
  Filled 2017-06-02 (×3): qty 1

## 2017-06-02 MED ORDER — CALCITRIOL 0.25 MCG PO CAPS
ORAL_CAPSULE | ORAL | Status: AC
  Filled 2017-06-02: qty 1

## 2017-06-02 MED ORDER — ALTEPLASE 2 MG IJ SOLR: 2.0000 mg | Freq: Once | INTRAMUSCULAR | Status: DC | PRN

## 2017-06-02 MED ORDER — QUETIAPINE FUMARATE 25 MG PO TABS
25.0000 mg | ORAL_TABLET | Freq: Once | ORAL | Status: DC
  Filled 2017-06-02: qty 1

## 2017-06-02 MED ORDER — DILTIAZEM HCL 100 MG IV SOLR
5.0000 mg/h | INTRAVENOUS | Status: DC
  Administered 2017-06-03: 10 mg/h via INTRAVENOUS
  Filled 2017-06-02 (×2): qty 100

## 2017-06-02 MED ORDER — PENTAFLUOROPROP-TETRAFLUOROETH EX AERO: 1.0000 "application " | INHALATION_SPRAY | CUTANEOUS | Status: DC | PRN

## 2017-06-02 MED ORDER — METOPROLOL TARTRATE 5 MG/5ML IV SOLN
5.0000 mg | INTRAVENOUS | Status: DC | PRN
  Administered 2017-06-03 (×2): 5 mg via INTRAVENOUS
  Filled 2017-06-02 (×2): qty 5

## 2017-06-02 MED ORDER — LORAZEPAM BOLUS VIA INFUSION: 0.5000 mg | Freq: Once | INTRAVENOUS | Status: DC

## 2017-06-02 MED ORDER — LORAZEPAM 2 MG/ML IJ SOLN
0.5000 mg | Freq: Once | INTRAMUSCULAR | Status: AC
  Administered 2017-06-02: 0.5 mg via INTRAVENOUS

## 2017-06-02 MED ORDER — NICOTINE 14 MG/24HR TD PT24
14.0000 mg | MEDICATED_PATCH | Freq: Every day | TRANSDERMAL | Status: DC
  Administered 2017-06-02 – 2017-06-04 (×2): 14 mg via TRANSDERMAL
  Filled 2017-06-02 (×2): qty 1

## 2017-06-02 MED ORDER — HALOPERIDOL LACTATE 5 MG/ML IJ SOLN
5.0000 mg | Freq: Once | INTRAMUSCULAR | Status: AC
  Administered 2017-06-02: 5 mg via INTRAMUSCULAR
  Filled 2017-06-02: qty 1

## 2017-06-02 MED ORDER — DILTIAZEM HCL ER COATED BEADS 120 MG PO CP24
120.0000 mg | ORAL_CAPSULE | Freq: Every day | ORAL | Status: DC
  Administered 2017-06-02 – 2017-06-03 (×2): 120 mg via ORAL
  Filled 2017-06-02 (×2): qty 1

## 2017-06-02 NOTE — Plan of Care (Signed)
Problem: Safety: Goal: Ability to remain free from injury will improve Outcome: Progressing Bed alarm on, personal items and call light within reach.  Pt knows to call staff for assistance with getting out of bed.  Problem: Pain Managment: Goal: General experience of comfort will improve Outcome: Progressing Denies c/o pain or discomfort.  Problem: Physical Regulation: Goal: Ability to maintain clinical measurements within normal limits will improve Outcome: Progressing VSS.  HR controlled with cardizem gtt. Goal: Will remain free from infection Outcome: Progressing No s/s of infection.

## 2017-06-02 NOTE — Progress Notes (Signed)
Security called. Pt will not follow commands. Cont to get oob and cont to be confused and combative. Spoke with daughter Marcelino Duster. Stated this was not pt norm. Had been confused in past in hospital after being given anesthesia in past. Dr. Janee Morn notified. Emelda Brothers RN

## 2017-06-02 NOTE — Progress Notes (Signed)
[facility] Progress Note  Subjective: no c/o's, on HD  Vitals:   06/02/17 0945 06/02/17 1015 06/02/17 1045 06/02/17 1115  BP: 115/73 127/88 111/76 116/82  Pulse: 83 87 72 74  Resp: 18  17 16   Temp:      TempSrc:      SpO2:   99%   Weight:        Inpatient medications: . aspirin  325 mg Oral Daily  . calcitRIOL      . calcitRIOL      . calcitRIOL  1.25 mcg Oral Once in dialysis  . [START ON 06/04/2017] calcitRIOL  1.25 mcg Oral Q M,W,F-HD  . calcium acetate  2,001 mg Oral TID WC  . [START ON 06/04/2017] cinacalcet  30 mg Oral Q M,W,F-HD  . cinacalcet  30 mg Oral Once in dialysis  . diltiazem  120 mg Oral Daily  . metoprolol succinate  25 mg Oral Daily  . multivitamin  1 tablet Oral QHS   . sodium chloride    . sodium chloride    . diltiazem (CARDIZEM) infusion 5 mg/hr (06/02/17 0620)  . [START ON 06/06/2017] ferric gluconate (FERRLECIT/NULECIT) IV     sodium chloride, sodium chloride, acetaminophen **OR** acetaminophen, alteplase, heparin, heparin, ipratropium-albuterol, lidocaine (PF), lidocaine-prilocaine, pentafluoroprop-tetrafluoroeth  Exam: Alert, thin AAM, no distress No jvd Chest clear bilat Cor irreg irreg no mrg Abd soft ntnd Ext no edema LUA AVF+bruit NF , ox3  Dialysis: [facility] MWF    4h 2/2.25 bath   65kg  LUA AVF  Hep 2000 -venofer 50/wk -mircera 50 every 4wks, last 8/22 -calcitriol 1.25 ug tiw -sensipar 30 mwf w HD -pth 361, Hb 10.6, tsat 23%      Impression: 1  Afib with RVR - known hx of afib in the past, has refused coumadin in the past for this, hx of GIB.  Compliance w/ meds may be an issue in OP setting.  On dilt/ MTP , same as home meds.  2  ESRD MWF HD. HD Monday if still here.  3  HTN/ vol - on dilt 120/ d and MTP XL 25/d, was on these at home as well 4  Anemia Hb 11 5  LG temp - normal WBC/ diff, hx pna 7/18. Observe.  6  MBD - cont hect/ sensipar, phoslo 7  Hx amyloidosis - not on any Rx for this currently  Plan - as  above   Vinson Moselle MD [facility] pager [phone]   06/02/2017, 12:02 PM    Recent Labs Lab 06/01/17 0820 06/01/17 2023 06/02/17 0423  NA 143  --  143  K 3.9  --  3.8  CL 102  --  102  CO2 30  --  27  GLUCOSE 82  --  92  BUN 20  --  30*  CREATININE 8.73*  --  10.32*  CALCIUM 9.4  --  8.9  PHOS  --  6.0*  --     Recent Labs Lab 06/01/17 0820  AST 13*  ALT 7*  ALKPHOS 48  BILITOT 1.0  PROT 6.2*  ALBUMIN 3.4*    Recent Labs Lab 06/01/17 0945 06/02/17 0423  WBC 5.1 6.3  NEUTROABS 3.3  --   HGB 11.1* 10.7*  HCT 34.6* 34.0*  MCV 100.3* 99.7  PLT 162 150   Iron/TIBC/Ferritin/ %Sat No results found for: IRON, TIBC, FERRITIN, IRONPCTSAT

## 2017-06-02 NOTE — Progress Notes (Signed)
Interim progress note  S: Paged by RN for persistently elevated HR in the 140s-160s also with continued agitation. Patient has been swinging at staff and pulled out IV earlier this evening.  He denies any CP, SOB or feeling of heart racing.   O: BP 117/74, HR 160-170s and RR WNL.   General: elderly man lying in bed with eyes closed in NAD, but agitated Cardiac: tachycardic with irregularly irregular rhythm, no apparent murmurs Resp: NWOB, CTABL on anterior chest Extremities: cachectic appearing and without LE swelling Psych: moderately agitated  A/P:  76yo M who presented with afib w/ RVR who was transitioned to oral medication for HR control this afternoon who is now back in afib with RVR and is moderately agitated. Patient does not have IV access at this time and is being combative to RN. IV team was consulted who attempted access and patient became severely agitated, which made procedure unsafe. Patient received haldol 5mg  IM in order to calm him down to gain IV access. RN will page family medicine team back IV is placed and will start dilt drip and obtain EKG.

## 2017-06-02 NOTE — Progress Notes (Signed)
PT Cancellation Note  Patient Details Name: Shane [name] MRN: [id number] DOB: December 19, 1940   Cancelled Treatment:    Reason Eval/Treat Not Completed: Patient at procedure or test/unavailable;Medical issues which prohibited therapy.  Patient in HD in am, and confused/combative this pm.  Will return at later date for PT evaluation.   Vena Austria 06/02/2017, 6:45 PM Durenda Hurt. Renaldo Fiddler, [facility] Acute Rehab Services Pager [phone]

## 2017-06-02 NOTE — Progress Notes (Signed)
Family Medicine Teaching Service Daily Progress Note Intern Pager: [phone]  Patient name: RAUNEL DIMARTINO Medical record number: [id number] Date of birth: 09-21-1941 Age: 76 y.o. Gender: male  Primary Care Provider: [name], MD Consultants: nephro Code Status: FULL  Pt Overview and Major Events to Date:  8/24 admitted in Afib RVR, requiring dilt drip  Assessment and Plan: JAYLEN KNOPE is a 76 y.o. male presenting with Afib w/ rvr (140s) and bleeding from a HD fistula that took 1hr to stop 8/24. 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   Afib with RVR  - History of afib on dilt 120 mg qd and toprol 25 mg daily at home. Patient is not chronically anticoagulated due to concerns about compliance on coumadin, not a NOAC candidate due to CRF per previous cardiology notes. Required restarting of dilt drip overnight, will transition to PO dilt now. 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 MWF HD days. Last HD was Wed 8/22. Stopped HD this AM due to afib with RVR and bleeding at fistula site. - consult to nephro for HD, patient missed the Friday session - patient on the list for HD 8/25  AV fistular bleed, resolved:  Per report, patient had notable bleeding from fistula site at HD today. Compression was held throughout transport to the ED and in the ED. No significant bleeding on admission. - patient is not chronically anticoagulated - monitor AM hgb given this history of bleed  HTN, currently well controlled -  Home meds include Toprol 25 mg daily as noted above.  - continue home toprol   Amyloidosis - patient not taking any meds for this currently  Social- Concern that he is not mentally capable of tracking his medications - will discuss with patient and family need for more help with  medication  FEN/GI: renal, fluid restriction Prophylaxis: SCDs because of prior bleed, no pharmocologic  Disposition: continued inpatient management of ESRD and Afib with RVR  Subjective:  Patient feels well this morning, joking with me. Doesn't want to discuss noncompliance, states "its all in the past," and he plans to take his PO meds when he is discharged.   Objective: Temp:  [97.4 F (36.3 C)-98.3 F (36.8 C)] 97.4 F (36.3 C) (08/25 0729) Pulse Rate:  [25-138] 108 (08/25 0745) Resp:  [17-23] 17 (08/25 0745) BP: (94-156)/(61-118) 112/76 (08/25 0745) SpO2:  [84 %-100 %] 97 % (08/25 0729) Weight:  [147 lb 11.3 oz (67 kg)] 147 lb 11.3 oz (67 kg) (08/25 0715) Physical Exam: General: Thin male lying in bed in NAD.  Cardiovascular: irregular rhythm, regular rate (90) Respiratory: CTAB, easy WOB, no wheezes Abdomen: SNTND, +BS Extremities: no edema, warm  Laboratory:  Recent Labs Lab 06/01/17 0945 06/02/17 0423  WBC 5.1 6.3  HGB 11.1* 10.7*  HCT 34.6* 34.0*  PLT 162 150    Recent Labs Lab 06/01/17 0820 06/02/17 0423  NA 143 143  K 3.9 3.8  CL 102 102  CO2 30 27  BUN 20 30*  CREATININE 8.73* 10.32*  CALCIUM 9.4 8.9  PROT 6.2*  --   BILITOT 1.0  --   ALKPHOS 48  --   ALT 7*  --   AST 13*  --   GLUCOSE 82 92    Imaging/Diagnostic Tests: No new  imaging results.  Garth Bigness, MD 06/02/2017, 8:37 AM PGY-2, [facility] FPTS Intern pager: [phone], text pages welcome

## 2017-06-02 NOTE — Progress Notes (Signed)
Pt becoming increasingly confused and becoming combative. Dr. Janee Morn notified. Cont to monitor. Emelda Brothers RN

## 2017-06-02 NOTE — Progress Notes (Signed)
Notified Dr. Myrtie Soman pt had pulled out IV. Order given to change ativan to IM. Emelda Brothers RN

## 2017-06-02 NOTE — Progress Notes (Signed)
OT Cancellation Note  Patient Details Name: Shane [name] MRN: [id number] DOB: 06/05/1941   Cancelled Treatment:    Reason Eval/Treat Not Completed: Patient at procedure or test/ unavailable (currently in HD). Will follow up as time allows.   Gaye Alken M.S., OTR/L  06/02/2017, 7:57 AM

## 2017-06-03 MED ORDER — PENTAFLUOROPROP-TETRAFLUOROETH EX AERO
1.0000 | INHALATION_SPRAY | CUTANEOUS | Status: DC | PRN
Start: 2017-06-03 — End: 2017-06-04

## 2017-06-03 MED ORDER — DILTIAZEM HCL ER COATED BEADS 240 MG PO CP24
240.0000 mg | ORAL_CAPSULE | Freq: Every day | ORAL | Status: DC
  Administered 2017-06-04: 240 mg via ORAL
  Filled 2017-06-03: qty 1

## 2017-06-03 MED ORDER — DILTIAZEM HCL ER COATED BEADS 120 MG PO CP24
120.0000 mg | ORAL_CAPSULE | Freq: Once | ORAL | Status: AC
  Administered 2017-06-03: 120 mg via ORAL
  Filled 2017-06-03: qty 1

## 2017-06-03 MED ORDER — SODIUM CHLORIDE 0.9 % IV SOLN: 100.0000 mL | INTRAVENOUS | Status: DC | PRN

## 2017-06-03 MED ORDER — LIDOCAINE-PRILOCAINE 2.5-2.5 % EX CREA
1.0000 | TOPICAL_CREAM | CUTANEOUS | Status: DC | PRN
Start: 2017-06-03 — End: 2017-06-04

## 2017-06-03 MED ORDER — ALTEPLASE 2 MG IJ SOLR: 2.0000 mg | Freq: Once | INTRAMUSCULAR | Status: DC | PRN

## 2017-06-03 MED ORDER — SODIUM CHLORIDE 0.9 % IV SOLN
100.0000 mL | INTRAVENOUS | Status: DC | PRN
Start: 2017-06-03 — End: 2017-06-04

## 2017-06-03 MED ORDER — HEPARIN SODIUM (PORCINE) 1000 UNIT/ML DIALYSIS
1000.0000 [IU] | INTRAMUSCULAR | Status: DC | PRN
Start: 2017-06-03 — End: 2017-06-04

## 2017-06-03 MED ORDER — HEPARIN SODIUM (PORCINE) 1000 UNIT/ML DIALYSIS: 1900.0000 [IU] | Freq: Once | INTRAMUSCULAR | Status: DC

## 2017-06-03 MED ORDER — LIDOCAINE HCL (PF) 1 % IJ SOLN: 5.0000 mL | INTRAMUSCULAR | Status: DC | PRN

## 2017-06-03 NOTE — Plan of Care (Signed)
Problem: Education: Goal: Knowledge of [facility] Education information/materials will improve Outcome: Progressing Spoke with patient and his son re: HR, medications and the importance of getting another IV site to be able to either restart his Cardizem drip or to give meds slow IVP, will continue to monitor understanding and instruct as needed.

## 2017-06-03 NOTE — Progress Notes (Signed)
Family Medicine Teaching Service Daily Progress Note Intern Pager: [phone]  Patient name: Shane [name] Medical record number: [id number] Date of birth: [birth year] Age: 76 y.o. Gender: male  Primary Care Provider: [name], MD Consultants: nephro Code Status: FULL  Pt Overview and Major Events to Date:  8/24 admitted in Afib RVR, requiring dilt drip  Assessment and Plan: Shane [name] is a 76 y.o. male presenting with Afib w/ rvr (140s) and bleeding from a HD fistula that took 1hr to stop 8/24. 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   Afib with RVR  - History of afib on dilt 120 mg qd and toprol 25 mg daily at home. Patient is not chronically anticoagulated due to concerns about compliance on coumadin, not a NOAC candidate due to CRF per previous cardiology notes. Once again patient required restarting of dilt drip overnight. Transition to PO this AM as noted below. 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 MWF HD days. - HD MWF  AV fistular bleed, resolved:  Per report, patient had notable bleeding from fistula site at HD on day of admission. Compression was held throughout transport to the ED and in the ED. No significant bleeding on admission. - cont ASA given he is hemodynamically stable - monitor AM hgb given this history of bleed  HTN, currently well controlled -  Home meds include Toprol 25 mg daily as noted above.  - continue home toprol   Amyloidosis - patient not taking any meds for this currently  Social- Concern that he is not mentally capable of tracking his medications - will discuss with patient and family need for more help with medication  FEN/GI: renal, fluid restriction Prophylaxis: SCDs because of prior bleed, no  pharmocologic  Disposition: continued inpatient management of ESRD and Afib with RVR  Subjective:  Patient with episode of delirium/aggitation overnight. See separate progress note. This morning he is pleasant and oriented to person and place, knows it is August but does not know the year (same way on admission). No acute complaints this morning.  Objective: Temp:  [97.4 F (36.3 C)-98.9 F (37.2 C)] 97.9 F (36.6 C) (08/26 0745) Pulse Rate:  [31-157] 88 (08/26 0745) Resp:  [11-30] 21 (08/26 0745) BP: (83-162)/(55-116) 115/97 (08/26 0745) SpO2:  [94 %-100 %] 98 % (08/26 0404) Weight:  [134 lb (60.8 kg)-142 lb 13.7 oz (64.8 kg)] 134 lb (60.8 kg) (08/26 0404) Physical Exam:  General: NAD, rests comfortably Cardiovascular: irregular rhythm, regular rate (~90s) Respiratory: CTA bil, no W/R/R Abdomen: SNTND, +BS Extremities: no edema, warm  Laboratory:  Recent Labs Lab 06/01/17 0945 06/02/17 0423  WBC 5.1 6.3  HGB 11.1* 10.7*  HCT 34.6* 34.0*  PLT 162 150    Recent Labs Lab 06/01/17 0820 06/02/17 0423  NA 143 143  K 3.9 3.8  CL 102 102  CO2 30 27  BUN 20 30*  CREATININE 8.73* 10.32*  CALCIUM 9.4 8.9  PROT 6.2*  --   BILITOT 1.0  --   ALKPHOS 48  --   ALT 7*  --   AST 13*  --   GLUCOSE 82 92    Imaging/Diagnostic Tests: No new imaging results.  Shane Pouch, MD 06/03/2017, 8:36 AM PGY-2, [facility] FPTS  Intern pager: [phone], text pages welcome

## 2017-06-03 NOTE — Progress Notes (Signed)
OT Cancellation Note  Patient Details Name: Shane [name] MRN: [id number] DOB: 03-22-1941   Cancelled Treatment:     Patient was not seen in AM per nursing request. Pt. Nurse stated that patient has been agitated and was given Haladol last night and that he is still sleeping.   [name] 06/03/2017, 8:12 AM

## 2017-06-03 NOTE — Progress Notes (Signed)
[facility] Progress Note  Subjective: agitated last night, refusing meds, lashing out at staff  Vitals:   06/03/17 0440 06/03/17 0531 06/03/17 0730 06/03/17 0745  BP: (!) 92/55 97/73  (!) 115/97  Pulse:    88  Resp: 11 (!) 22 (!) 24 (!) 21  Temp:    97.9 F (36.6 C)  TempSrc:    Oral  SpO2:      Weight:        Inpatient medications: . aspirin  325 mg Oral Daily  . [START ON 06/04/2017] calcitRIOL  1.25 mcg Oral Q M,W,F-HD  . calcium acetate  2,001 mg Oral TID WC  . [START ON 06/04/2017] cinacalcet  30 mg Oral Q M,W,F-HD  . cinacalcet  30 mg Oral Once in dialysis  . diltiazem  120 mg Oral Once  . [START ON 06/04/2017] diltiazem  240 mg Oral Daily  . metoprolol succinate  25 mg Oral Daily  . multivitamin  1 tablet Oral QHS  . nicotine  14 mg Transdermal Daily   . [START ON 06/06/2017] ferric gluconate (FERRLECIT/NULECIT) IV     acetaminophen **OR** acetaminophen, ipratropium-albuterol  Exam: Alert, thin AAM, no distress No jvd Chest clear bilat Cor irreg irreg no mrg Abd soft ntnd Ext no edema LUA AVF+bruit NF , ox3  Dialysis: [facility] MWF    4h 2/2.25 bath   65kg  LUA AVF  Hep 2000 -venofer 50/wk -mircera 50 every 4wks, last 8/22 -calcitriol 1.25 ug tiw -sensipar 30 mwf w HD -pth 361, Hb 10.6, tsat 23%      Impression: 1  Afib with RVR - known hx of afib in the past, on MTP/ dilt po, rate OK now.  No coumadin due to concerns about compliance. Not NOAC candidate w ESRD.   2  ESRD - due to amyloidosis. On MWF HD. HD Monday 3  HTN/ vol - under dry wt 4kg, no vol excess one exam. BP's soft 4  Anemia Hb 11 5  MBD - cont hect/ sensipar, phoslo  Plan - HD tomorrow, keep even    Vinson Moselle MD [facility] pager [phone]   06/03/2017, 11:17 AM    Recent Labs Lab 06/01/17 0820 06/01/17 2023 06/02/17 0423  NA 143  --  143  K 3.9  --  3.8  CL 102  --  102  CO2 30  --  27  GLUCOSE 82  --  92  BUN 20  --  30*  CREATININE 8.73*   --  10.32*  CALCIUM 9.4  --  8.9  PHOS  --  6.0*  --     Recent Labs Lab 06/01/17 0820  AST 13*  ALT 7*  ALKPHOS 48  BILITOT 1.0  PROT 6.2*  ALBUMIN 3.4*    Recent Labs Lab 06/01/17 0945 06/02/17 0423  WBC 5.1 6.3  NEUTROABS 3.3  --   HGB 11.1* 10.7*  HCT 34.6* 34.0*  MCV 100.3* 99.7  PLT 162 150   Iron/TIBC/Ferritin/ %Sat No results found for: IRON, TIBC, FERRITIN, IRONPCTSAT

## 2017-06-03 NOTE — Progress Notes (Signed)
IV placed in lower inner forearm with one attempt, flushes well and was dressed and wrapped with Kerlix. MD aware, they came back down to assess. MD made aware of inability to get any PO meds in to him and that the EKG was unattainable at this time, will still attempt a little later and MD aware and okay with that, will start IV Cardizem as soon as she puts the order in, HR still high in the 160-170 range. No other changes noted at this time.

## 2017-06-03 NOTE — Progress Notes (Signed)
Patient's HR 140's and sustained but unable to get IV access or give any po meds, MD notified and will come to unit to assess. Pt at this time has become very combative and agitated. Shane [name] attempted to place an IV without success D/T patient jerking his arm away, will call IV team to see if they can assist and wait for MD to arrive. Family in room but unable to calm patient much at this time.

## 2017-06-03 NOTE — Evaluation (Addendum)
Physical Therapy Evaluation Patient Details Name: Shane [name] MRN: [id number] DOB: [birth year] Today's Date: 06/03/2017   History of Present Illness  Shane [name] is a 76 y.o. male presenting with Afib w/ rvr (140s) and bleeding from a HD fistula that took 1hr to stop 8/24. 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  Will benefit from acute PT to maximize functional mobility prior to discharge.  Patient with decreased strength, balance, cognition, and activity tolerance, impacting functional mobility/gait/safety.  Wife requires assist per patient.  Aides provide several hours of assist at least 5x/week.  Recommend patient d/c to SNF for continued therapy and safe environment.    Follow Up Recommendations SNF    Equipment Recommendations  None recommended by PT    Recommendations for Other Services       Precautions / Restrictions Precautions Precautions: Fall Restrictions Weight Bearing Restrictions: No      Mobility  Bed Mobility Overal bed mobility: Modified Independent                Transfers Overall transfer level: Needs assistance Equipment used: Rolling walker (2 wheeled) Transfers: Sit to/from Stand Sit to Stand: Mod assist         General transfer comment: Verbal cues for hand placement.  Mod assist to rise from bed to standing.  Required mod assist to control descent to return to sitting.  Ambulation/Gait Ambulation/Gait assistance: Min assist;+2 safety/equipment Ambulation Distance (Feet): 2 Feet Assistive device: Rolling walker (2 wheeled) Gait Pattern/deviations: Step-through pattern;Decreased step length - right;Decreased step length - left;Decreased stride length;Shuffle;Trunk flexed Gait velocity: decreased Gait velocity interpretation: Below normal speed for age/gender General Gait Details: Patient with decreased balance in stance, with  posterior lean.  Instructed patient on safe use of RW.  Patient able to take 4 steps forward and backward with RW and min assist for balance.  For longer distances, will need to follow closely with chair.  Stairs            Wheelchair Mobility    Modified Rankin (Stroke Patients Only)       Balance Overall balance assessment: Needs assistance Sitting-balance support: Feet supported;Single extremity supported Sitting balance-Leahy Scale: Fair     Standing balance support: Bilateral upper extremity supported Standing balance-Leahy Scale: Poor Standing balance comment: Posterior lean initially.                             Pertinent Vitals/Pain Pain Assessment: No/denies pain    Home Living Family/patient expects to be discharged to:: Private residence Living Arrangements: Spouse/significant other (Wife is in a w/c) Available Help at Discharge: Personal care attendant;Available PRN/intermittently (3 days/week in AM; 5 days/week in PM) Type of Home: House Home Access: Ramped entrance     Home Layout: One level Home Equipment: Walker - 2 wheels;Cane - single point;Bedside commode;Shower seat Additional Comments: Information from patient - ? reliability.  Patient reports he assists wife at home pta.  PCA is in home for wife, but helps patient at times.    Prior Function Level of Independence: Independent with assistive device(s);Needs assistance   Gait / Transfers Assistance Needed: States he uses cane  ADL's / Homemaking Assistance Needed: States he is independent with ADL's and then states that Aide helps him with ADL's.  Comments: Patient does not drive.  His daughter provides transportation.  Hand Dominance        Extremity/Trunk Assessment   Upper Extremity Assessment Upper Extremity Assessment: Generalized weakness    Lower Extremity Assessment Lower Extremity Assessment: Generalized weakness       Communication   Communication: No  difficulties  Cognition Arousal/Alertness: Awake/alert Behavior During Therapy: WFL for tasks assessed/performed Overall Cognitive Status: No family/caregiver present to determine baseline cognitive functioning                                 General Comments: Some confusion. Unrealistic opinion of his own mobility.  Decreased safety awareness.      General Comments      Exercises     Assessment/Plan    PT Assessment Patient needs continued PT services  PT Problem List Decreased strength;Decreased activity tolerance;Decreased balance;Decreased mobility;Decreased coordination;Decreased cognition;Decreased knowledge of use of DME;Decreased safety awareness       PT Treatment Interventions DME instruction;Gait training;Functional mobility training;Therapeutic activities;Therapeutic exercise;Balance training;Cognitive remediation;Patient/family education    PT Goals (Current goals can be found in the Care Plan section)  Acute Rehab PT Goals Patient Stated Goal: To go home PT Goal Formulation: With patient Time For Goal Achievement: 06/10/17 Potential to Achieve Goals: Fair    Frequency Min 2X/week   Barriers to discharge Decreased caregiver support PRN assist available.  Patient reports he helps with wife's care.    Co-evaluation               AM-PAC PT "6 Clicks" Daily Activity  Outcome Measure Difficulty turning over in bed (including adjusting bedclothes, sheets and blankets)?: None Difficulty moving from lying on back to sitting on the side of the bed? : None Difficulty sitting down on and standing up from a chair with arms (e.g., wheelchair, bedside commode, etc,.)?: A Lot Help needed moving to and from a bed to chair (including a wheelchair)?: A Lot Help needed walking in hospital room?: A Lot Help needed climbing 3-5 steps with a railing? : Total 6 Click Score: 15    End of Session Equipment Utilized During Treatment: Gait belt Activity  Tolerance: Patient limited by fatigue Patient left: in bed;with call bell/phone within reach;with bed alarm set Nurse Communication: Mobility status (Recommend SNF (patient states he is going home)) PT Visit Diagnosis: Unsteadiness on feet (R26.81);Other abnormalities of gait and mobility (R26.89);Muscle weakness (generalized) (M62.81)    Time: 4144-3601 PT Time Calculation (min) (ACUTE ONLY): 12 min   Charges:   PT Evaluation $PT Eval Moderate Complexity: 1 Mod     PT G Codes:        Durenda Hurt. Renaldo Fiddler, [facility] Acute Rehab Services Pager [phone]   Vena Austria 06/03/2017, 9:10 PM

## 2017-06-03 NOTE — Progress Notes (Signed)
Spoke with MD regarding patient's HR in the 70-80's NSR with frequent PAC's and B/P 90's/50's with a MAP 67, attempting to wean off Cardizem drip, attempted to get EKG but patient tries to swat at [place] and MD aware that we were unable to give any PO meds, get labwork or get EKG and she was ok at thsi time, will continue to monitor.

## 2017-06-03 NOTE — Progress Notes (Signed)
MD's in room and aware of HR and patient's elevating agitation. IM Haldol given with assistance of extra staff members, will attempt to give po meds ordered and to start IV meds once patient calms from medication, will continue to monitor. HR 160-180 range at this time.

## 2017-06-03 NOTE — Plan of Care (Signed)
Problem: Health Behavior/Discharge Planning: Goal: Ability to manage health-related needs will improve Outcome: Progressing Assess barriers to compliance with medical treatment

## 2017-06-04 ENCOUNTER — Inpatient Hospital Stay (HOSPITAL_COMMUNITY): Payer: Medicare Other

## 2017-06-04 LAB — GLUCOSE, CAPILLARY: GLUCOSE-CAPILLARY: 74 mg/dL (ref 65–99)

## 2017-06-04 LAB — RENAL FUNCTION PANEL
Albumin: 3.2 g/dL — ABNORMAL LOW (ref 3.5–5.0)
Anion gap: 10 (ref 5–15)
BUN: 31 mg/dL — ABNORMAL HIGH (ref 6–20)
CALCIUM: 9.8 mg/dL (ref 8.9–10.3)
CO2: 27 mmol/L (ref 22–32)
CREATININE: 9.63 mg/dL — AB (ref 0.61–1.24)
Chloride: 102 mmol/L (ref 101–111)
GFR, EST AFRICAN AMERICAN: 5 mL/min — AB (ref >60)
GFR, EST NON AFRICAN AMERICAN: 5 mL/min — AB (ref >60)
Glucose, Bld: 151 mg/dL — ABNORMAL HIGH (ref 65–99)
Phosphorus: 5.5 mg/dL — ABNORMAL HIGH (ref 2.5–4.6)
Potassium: 4.1 mmol/L (ref 3.5–5.1)
SODIUM: 139 mmol/L (ref 135–145)

## 2017-06-04 LAB — ECHOCARDIOGRAM COMPLETE: WEIGHTICAEL: 2303.37 [oz_av]

## 2017-06-04 LAB — CBC
HCT: 33.3 % — ABNORMAL LOW (ref 39.0–52.0)
Hemoglobin: 10.7 g/dL — ABNORMAL LOW (ref 13.0–17.0)
MCH: 32.1 pg (ref 26.0–34.0)
MCHC: 32.1 g/dL (ref 30.0–36.0)
MCV: 100 fL (ref 78.0–100.0)
PLATELETS: 136 10*3/uL — AB (ref 150–400)
RBC: 3.33 MIL/uL — AB (ref 4.22–5.81)
RDW: 14.3 % (ref 11.5–15.5)
WBC: 5.3 10*3/uL (ref 4.0–10.5)

## 2017-06-04 MED ORDER — CALCITRIOL 0.25 MCG PO CAPS
ORAL_CAPSULE | ORAL | Status: AC
  Filled 2017-06-04: qty 1

## 2017-06-04 MED ORDER — CALCITRIOL 0.5 MCG PO CAPS
ORAL_CAPSULE | ORAL | Status: AC
  Filled 2017-06-04: qty 2

## 2017-06-04 MED ORDER — DILTIAZEM HCL ER COATED BEADS 240 MG PO CP24: 240.0000 mg | ORAL_CAPSULE | Freq: Every day | ORAL | 0 refills | Status: DC

## 2017-06-04 NOTE — Plan of Care (Signed)
Problem: Tissue Perfusion: Goal: Risk factors for ineffective tissue perfusion will decrease Outcome: Adequate for Discharge Patient D/C to hoem with skin intact, color warm, dry and intact and pulses palpable and no s/s of complications.

## 2017-06-04 NOTE — Progress Notes (Signed)
Patient had a short burst of SVT while trying to get OOB, VSS and no c/o discomfort, will continue to monitor.

## 2017-06-04 NOTE — Progress Notes (Signed)
Reviewed D/C instructions with daughter in law and patient and went over med, appointment, My chart and answered questions, both patient and daughter in law verbalized understanding and copy of instructions given to them along with where to pick up his medications. Patient left via W/C with his belongings and family in stable condition.

## 2017-06-04 NOTE — Care Management Note (Signed)
Case Management Note  Patient Details  Name: Shane [name] MRN: [id number] Date of Birth: 10/18/1940  Subjective/Objective:   Pt presented for A Fib RVR- Cardizem gtt. Plan will be for home. Pt is refusing SNF. PT/OT recommendations for SNF. CM did reach out to wife. Wife is wheelchair bound with a HH Aide and she goes to an adult day care everyday. Wife will not be able to provide any care for the patient. Daughter was contacted and she wants pt to go to SNF. CSW/ CM did speak with patient and the patient asked [place] to leave the room and he is still declining SNF. Plan will be for home with [facility]- Daughter did ask for [facility]- Frances Furbish contacted and [facility] to begin on Wednesday post HD.                  Action/Plan: Daugher stated she would call his granddaughter to pick him up to transport home. No further needs from CM at this time.   Expected Discharge Date:  06/04/17               Expected Discharge Plan:  Home w Home Health Services  In-House Referral:  Clinical Social Work  Discharge planning Services  CM Consult  Post Acute Care Choice:  Home Health Choice offered to:  Patient, Adult Children  DME Arranged:  N/A DME Agency:  NA  HH Arranged:  RN, PT, Disease Management, Refused SNF, OT, Nurse's Aide, Social Work [employer] Agency:   Artist  Status of Service:  Completed, signed off  If discussed at [employer], dates discussed:    Additional Comments:  Gala Lewandowsky, RN 06/04/2017, 4:43 PM

## 2017-06-04 NOTE — Plan of Care (Signed)
Problem: Safety: Goal: Ability to remain free from injury will improve Outcome: Adequate for Discharge Patient went home via W/C with his family in stable condition. Patient able to ambulate with his cane and minimal assistance while in his room prior to D/C but has Home Health set up to make a visit.

## 2017-06-04 NOTE — Social Work (Signed)
CSW met with patient along with RNCM to discuss and encourage patient to reconsider SNF at DC as his wife is in wheelchair and family wants him to get further rehabilitative services. Pt declined SNF adamantly and requested that CSW and RNCM leave his room.  RNCM indicated she would f/u with family on same.  CSW will continue to assist as warranted.  Elissa Hefty, LCSW Clinical Social Worker [phone]

## 2017-06-04 NOTE — Progress Notes (Signed)
Report received via Valentina Gu RN and patient is dressed and ready for D/C just awaiting a ride, will review discharge instructions, meds, follow up appointments and answer questions when ride comes to make sure that it's understood what needs to be done and patient is in agreement with this.

## 2017-06-04 NOTE — Plan of Care (Signed)
Problem: Pain Managment: Goal: General experience of comfort will improve Outcome: Adequate for Discharge Patient has been pain free since admission and has not required any pain medications.

## 2017-06-04 NOTE — Procedures (Signed)
Stable hemodynamics, HR 75, BP 117/80.  Appropriate. Keeping even on HD. Duc Crocket C

## 2017-06-04 NOTE — Plan of Care (Signed)
Problem: Education: Goal: Knowledge of [facility] Education information/materials will improve Outcome: Adequate for Discharge Reviewed with aptient and family and he will go home with Home health set up to visit him. Pt. And family verbalized understanding.

## 2017-06-04 NOTE — Clinical Social Work Note (Signed)
Clinical Social Work Assessment  Patient Details  Name: Shane [name] MRN: [id number] Date of Birth: Apr 25, 1941  Date of referral:  06/04/17               Reason for consult:  Facility Placement                Permission sought to share information with:  Facility Sport and exercise psychologist, Family Supports Permission granted to share information::  Yes, Verbal Permission Granted  Name::     Shane [name]  Agency::  SNFs  Relationship::  granddaughter  Contact Information:  [phone]  Housing/Transportation Living arrangements for the past 2 months:  Single Family Home Source of Information:  Patient Patient Interpreter Needed:  None Criminal Activity/Legal Involvement Pertinent to Current Situation/Hospitalization:  No - Comment as needed Significant Relationships:  Adult Children, Other Family Members Lives with:  Spouse Do you feel safe going back to the place where you live?  Yes Need for family participation in patient care:  Yes (Comment)  Care giving concerns: Patient is from home with wife, who uses a wheelchair. Granddaughter Shane [name] was at bedside. Pt refusing SNF but disoriented to situation. Granddaughter and her mother agreeable to SNF.   Social Worker assessment / plan: CSW met with patient and granddaughter at bedside. Pt declining SNF but disoriented to situation. Family agreeable to SNF. Pt from [place]. CSW to follow up with additional family and support with disposition planning.  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 Family appreciative of care.  Patient/Family's Understanding of and Emotional Response to Diagnosis, Current Treatment, and Prognosis: Patient lacking understanding of situation, though aware of person, place,  time. Family with good understanding of patient's condition and needs for rehab.  Emotional Assessment Appearance:  Appears stated age Attitude/Demeanor/Rapport:  Other (appropriate) Affect (typically observed):  Appropriate Orientation:  Oriented to Self, Oriented to Place, Oriented to  Time Alcohol / Substance use:  Tobacco Use Psych involvement (Current and /or in the community):  No (Comment)  Discharge Needs  Concerns to be addressed:  Discharge Planning Concerns Readmission within the last 30 days:  No Current discharge risk:  Physical Impairment Barriers to Discharge:  Continued Medical Work up   Estanislado Emms, LCSW 06/04/2017, 2:05 PM

## 2017-06-04 NOTE — Plan of Care (Signed)
Problem: Cardiac: Goal: Ability to achieve and maintain adequate cardiopulmonary perfusion will improve Outcome: Adequate for Discharge Patient was in A-Fib and converted with IV Cardizem and IV Lopressor to a NSR with PAC and adequate B/P, HR 60-100.

## 2017-06-04 NOTE — Plan of Care (Signed)
Problem: Education: Goal: Knowledge of disease or condition will improve Outcome: Adequate for Discharge Reviewed A-Fib and his medications and provided him with written materials regarding what A-Fib is and medications, procedures to control it and he verbalized understanding.

## 2017-06-04 NOTE — Progress Notes (Signed)
  Echocardiogram 2D Echocardiogram has been performed.  Leta Jungling M 06/04/2017, 3:50 PM

## 2017-06-04 NOTE — Progress Notes (Signed)
Family Medicine Teaching Service Daily Progress Note Intern Pager: [phone]  Patient name: ELMOND POEHLMAN Medical record number: [id number] Date of birth: [birth year] Age: 76 y.o. Gender: male  Primary Care Provider: [name], MD Consultants: nephro Code Status: FULL  Pt Overview and Major Events to Date:  8/24 admitted in Afib RVR, requiring dilt drip  Assessment and Plan: OMARRION CARMER is a 76 y.o. male presenting with Afib w/ rvr (140s) and bleeding from a HD fistula that took 1hr to stop 8/24. 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   Afib with RVR  - Rate controlled on increased PO diltiazem, and home metoprol. History of afib on dilt 120 mg qd and toprol 25 mg daily at home. Patient is not chronically anticoagulated due to concerns about compliance on coumadin, not a NOAC candidate due to CRF per previous cardiology notes. Once again patient required restarting of dilt drip overnight. Transition to PO this AM as noted below. - monitor on telemetry - continue Toprol 25 mg daily - continue ASA 325 mg daily  CKD on HD - Pt received dialysis today. secondary to amyloid. MWF HD days. - HD MWF  AV fistular bleed, resolved:  Per report, patient had notable bleeding from fistula site at HD on day of admission. Compression was held throughout transport to the ED and in the ED. No significant bleeding on admission. - cont ASA given he is hemodynamically stable - monitor AM hgb given this history of bleed  HTN, currently well controlled -  Home meds include Toprol 25 mg daily as noted above.  - continue home toprol   Amyloidosis - patient not taking any meds for this currently  Social- Improved. Pt endorses wanting to go home. He denies Concern that he is not mentally capable of tracking his medications. P - will discuss with patient and family need for more help with medication  FEN/GI: renal, fluid  restriction Prophylaxis: SCDs because of prior bleed, no pharmocologic  Disposition: Home w/ HH PT/RN  Subjective:  Pt was in HD this morning. Pleasant. Wants to go home. Refuses SNF. Endorses living at home with wife, who is elderly and would not be able to help him in case he fell or became altered.   Objective: Temp:  [97.1 F (36.2 C)-98.9 F (37.2 C)] 97.1 F (36.2 C) (08/27 0754) Pulse Rate:  [64-79] 66 (08/27 0800) Resp:  [16-19] 17 (08/27 0830) BP: (100-120)/(54-89) 118/73 (08/27 0800) SpO2:  [98 %-100 %] 99 % (08/27 0754) Weight:  [144 lb (65.3 kg)-149 lb (67.6 kg)] 144 lb (65.3 kg) (08/27 0754) Physical Exam:  General: NAD, rests comfortably in bed, getting HD this morning Cardiovascular: irregular rhythm, rate controlled (<100) Respiratory: CTAB , nl work of breathing Abdomen: S,NT,ND Extremities: no edema, no lower extremity lesions observed  Laboratory:  Recent Labs Lab 06/01/17 0945 06/02/17 0423 06/04/17 0730  WBC 5.1 6.3 5.3  HGB 11.1* 10.7* 10.7*  HCT 34.6* 34.0* 33.3*  PLT 162 150 136*    Recent Labs Lab 06/01/17 0820 06/02/17 0423 06/04/17 0801  NA 143 143 139  K 3.9 3.8 4.1  CL 102 102 102  CO2 30 27 27   BUN 20 30* 31*  CREATININE 8.73* 10.32* 9.63*  CALCIUM 9.4 8.9 9.8  PROT 6.2*  --   --   BILITOT 1.0  --   --   ALKPHOS 48  --   --   ALT 7*  --   --  AST 13*  --   --   GLUCOSE 82 92 151*    Imaging/Diagnostic Tests: No new imaging results.  Garnette Gunner, MD 06/04/2017, 9:50 AM PGY-1, [facility] Family Medicine FPTS Intern pager: [phone], text pages welcome

## 2017-06-04 NOTE — Plan of Care (Signed)
Problem: Activity: Goal: Risk for activity intolerance will decrease Outcome: Adequate for Discharge Patient had dialysis today and was adequate for D/C, VSS and no s/s of complications noted.

## 2017-06-04 NOTE — Plan of Care (Signed)
Problem: Nutrition: Goal: Adequate nutrition will be maintained Outcome: Adequate for Discharge Patient on a renal diet with fluid restrictions but received adequate nutrition while hospitalized.

## 2017-06-04 NOTE — Progress Notes (Signed)
OT Cancellation    06/04/17 0800  OT Visit Information  Last OT Received On 06/04/17  Reason Eval/Treat Not Completed Patient at procedure or test/ unavailable (HD)  [facility], OT/L  [phone] 06/04/2017

## 2017-06-04 NOTE — Plan of Care (Signed)
Problem: Skin Integrity: Goal: Risk for impaired skin integrity will decrease Outcome: Adequate for Discharge Patient's skin dry and flaky but intact with palpable pulses and no s/s of complications.

## 2017-06-04 NOTE — Progress Notes (Signed)
Occupational Therapy Evaluation Patient Details Name: Shane [name] MRN: [id number] DOB: [birth year] Today's Date: 06/04/2017    History of Present Illness Shane [name] is a 76 y.o. male presenting with Afib w/ rvr (140s) and bleeding from a HD fistula that took 1hr to stop 8/24. 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 Pt states he assists her wife with her self care. Pt became agitated during assessment, stating that he was "making his own decisions and was not doing anything until his wife was present". Pt states he was not going to go "anywhere unless he and his wife agree to it but that he is not going to no skilled nursing facility". Pt able to complete his self care with set up and requires S for mobility @ cane level. Pt with apparent cognitive deficits, however, unsure of baseline level of function. Recommend pt initially have 24/7 S and follow up with HHOT and HHSW as well. Will follow acutely to facilitate safe DC home.     Follow Up Recommendations  Supervision/Assistance - 24 hour;Home health OT    Equipment Recommendations  None recommended by OT    Recommendations for Other Services       Precautions / Restrictions Precautions Precautions: Fall Restrictions Weight Bearing Restrictions: No      Mobility Bed Mobility Overal bed mobility: Modified Independent                Transfers Overall transfer level: Needs assistance Equipment used:  (cane) Transfers: Sit to/from Stand Sit to Stand: Supervision              Balance Overall balance assessment: Needs assistance Sitting-balance support: Feet supported;Single extremity supported Sitting balance-Leahy Scale: Fair     Standing balance support: Bilateral upper extremity supported Standing balance-Leahy Scale: Fair                             ADL  either performed or assessed with clinical judgement   ADL Overall ADL's : Needs assistance/impaired     Grooming: Supervision/safety;Standing   Upper Body Bathing: Set up;Sitting   Lower Body Bathing: Set up;Sit to/from stand   Upper Body Dressing : Set up;Sitting   Lower Body Dressing: Set up;Supervision/safety;Sit to/from stand   Toilet Transfer: Supervision/safety;Ambulation (using cane)   Toileting- Clothing Manipulation and Hygiene: Supervision/safety       Functional mobility during ADLs: Supervision/safety (cane level)       Vision         Perception     Praxis      Pertinent Vitals/Pain Pain Assessment: No/denies pain     Hand Dominance Right   Extremity/Trunk Assessment Upper Extremity Assessment Upper Extremity Assessment: Generalized weakness   Lower Extremity Assessment Lower Extremity Assessment: Defer to PT evaluation   Cervical / Trunk Assessment Cervical / Trunk Assessment: Kyphotic   Communication Communication Communication: No difficulties   Cognition Arousal/Alertness: Awake/alert Behavior During Therapy: WFL for tasks assessed/performed Overall Cognitive Status: No family/caregiver present to determine baseline cognitive functioning                                 General Comments: Pt became agitated when this therapist was asking about his functional level. Pt  insisting that "he is the boss and makes his own decisions". Poor awareness/safety awareness   General Comments       Exercises     Shoulder Instructions      Home Living Family/patient expects to be discharged to:: Private residence Living Arrangements: Spouse/significant other (Wife is in a w/c) Available Help at Discharge: Personal care attendant;Available PRN/intermittently (3 days/week in AM; 5 days/week in PM) Type of Home: House Home Access: Ramped entrance     Home Layout: One level     Bathroom Shower/Tub: Contractor: Standard Bathroom Accessibility: Yes How Accessible: Accessible via walker Home Equipment: Walker - 2 wheels;Cane - single point;Bedside commode;Shower seat;Wheelchair - manual (DMA is used by wife)   Additional Comments: Information from patient - ? reliability.  Patient reports he assists wife at home pta.  PCA is in home for wife, but helps patient at times.      Prior Functioning/Environment Level of Independence: Independent with assistive device(s);Needs assistance  Gait / Transfers Assistance Needed: States he uses cane ADL's / Homemaking Assistance Needed: States he is independent with ADL's and helps his wife with her care   Comments: Patient does not drive.  His daughter provides transportation.        OT Problem List: Decreased strength;Decreased activity tolerance;Impaired balance (sitting and/or standing);Decreased safety awareness;Cardiopulmonary status limiting activity      OT Treatment/Interventions: Self-care/ADL training;Energy conservation;DME and/or AE instruction;Therapeutic exercise;Therapeutic activities;Cognitive remediation/compensation;Patient/family education;Balance training    OT Goals(Current goals can be found in the care plan section) Acute Rehab OT Goals Patient Stated Goal: to make his own decisions OT Goal Formulation: Patient unable to participate in goal setting (due to apparent agitation) Time For Goal Achievement: 06/18/17 Potential to Achieve Goals: Good  OT Frequency: Min 2X/week   Barriers to D/C:            Co-evaluation              AM-PAC PT "6 Clicks" Daily Activity     Outcome Measure Help from another person eating meals?: None Help from another person taking care of personal grooming?: None Help from another person toileting, which includes using toliet, bedpan, or urinal?: A Little Help from another person bathing (including washing, rinsing, drying)?: A Little Help from another person to put on and taking off  regular upper body clothing?: None Help from another person to put on and taking off regular lower body clothing?: A Little 6 Click Score: 21   End of Session Nurse Communication: Mobility status  Activity Tolerance: Patient tolerated treatment well Patient left: in bed;with call bell/phone within reach;with nursing/sitter in room  OT Visit Diagnosis: Unsteadiness on feet (R26.81);Muscle weakness (generalized) (M62.81);Other symptoms and signs involving cognitive function                Time: 1610-9604 OT Time Calculation (min): 16 min Charges:  OT General Charges $OT Visit: 1 Visit OT Evaluation $OT Eval Moderate Complexity: 1 Mod G-Codes:     Shane [name], OT/L  [phone] 06/04/2017  Celestine Bougie,HILLARY 06/04/2017, 1:49 PM

## 2017-06-04 NOTE — Plan of Care (Signed)
Problem: Health Behavior/Discharge Planning: Goal: Ability to manage health-related needs will improve Outcome: Adequate for Discharge Patient has Home Health arranged for assistance when D/C'd home and family will support.

## 2017-06-04 NOTE — Plan of Care (Signed)
Problem: Activity: Goal: Ability to tolerate increased activity will improve Outcome: Adequate for Discharge Patient is able to ambulate with a cane and minimal assistance.

## 2017-06-04 NOTE — Plan of Care (Signed)
Problem: Physical Regulation: Goal: Ability to maintain clinical measurements within normal limits will improve Outcome: Adequate for Discharge Patient assessed by Physical Therapy and disciplinary team arranged for Home Health to assist with his needs. Pt D/C'd to his home with his wife via daughter in law in stable condition.

## 2017-06-06 NOTE — Discharge Summary (Signed)
[facility] Discharge Summary  Patient name: Shane [name] Medical record number: [id number] Date of birth: 03/22/1941 Age: 76 y.o. Gender: male Date of Admission: 06/01/2017  Date of Discharge: 06/04/2017 Admitting Physician: Nestor RampSara L Neal, MD  Primary Care Provider: Ailene RavelHamrick, Maura L, MD Consultants: Nephrology  Indication for Hospitalization: Atrial Fibrillation w/ RVR  Discharge Diagnoses/Problem List:  Atrial Fibrilation w/ RVR, now rate controlled ESRD on HD HTN Amyloidosis  [facility]  Discharge Condition: Stable  Discharge Exam:  General: NAD, rests comfortably in bed, getting HD this morning Cardiovascular: irregular rhythm, rate controlled (<100) Respiratory: CTAB , nl work of breathing Abdomen: S,NT,ND Extremities: no edema, no lower extremity lesions observed  Brief Hospital Course:  [name] was admitted to the hospital atrial fibrillation w/ RVR. HR in 140s not controlled on home metoprolol and diltiazem. Patient was started on a diltiazem drip and heart rate became controlled in the 90s. During his hospitalization. Patient was slowly weaned from the diltiazem drip and converted to PO diltiazem at an increased dosage and his home metoprolol. HD was continued on his regular schedule. Patient was rate controlled at discharged to home with home health PT/OT.   Issues for Follow Up:  1. Rate control. Patient was discharged on an increased dosage of diltiazem on discharge. Home metoprolol was continued. Please ensure that his rate control is still appropriate.  Significant Procedures: None  Significant Labs and Imaging:   Recent Labs Lab 06/01/17 0945 06/02/17 0423 06/04/17 0730  WBC 5.1 6.3 5.3  HGB 11.1* 10.7* 10.7*  HCT 34.6* 34.0* 33.3*  PLT 162 150 136*    Recent Labs Lab 06/01/17 0820 06/01/17 2023 06/02/17 0423 06/04/17 0801  NA 143  --  143 139  K 3.9  --  3.8 4.1  CL 102  --  102 102  CO2 30   --  27 27  GLUCOSE 82  --  92 151*  BUN 20  --  30* 31*  CREATININE 8.73*  --  10.32* 9.63*  CALCIUM 9.4  --  8.9 9.8  MG  --  2.6*  --   --   PHOS  --  6.0*  --  5.5*  ALKPHOS 48  --   --   --   AST 13*  --   --   --   ALT 7*  --   --   --   ALBUMIN 3.4*  --   --  3.2*    No results found.   Results/Tests Pending at Time of Discharge: None  Discharge Medications:  Allergies as of 06/04/2017   No Known Allergies     Medication List    STOP taking these medications   doxycycline 100 MG capsule Commonly known as:  VIBRAMYCIN     TAKE these medications   albuterol-ipratropium 18-103 MCG/ACT inhaler Commonly known as:  COMBIVENT Inhale 2 puffs into the lungs every 6 (six) hours as needed for wheezing.   ammonium lactate 12 % cream Commonly known as:  AMLACTIN Apply 1 application topically daily.   aspirin EC 325 MG tablet Take 1 tablet (325 mg total) by mouth daily. Your may choose to take baby aspirin (81mg ) three tabs daily, instead the 325mg  tabs daily.   betamethasone valerate 0.1 % cream Commonly known as:  VALISONE Apply 1 application topically daily.   calcium acetate 667 MG capsule Commonly known as:  PHOSLO Take 2,001 mg by mouth 3 (three) times daily with  meals.   diltiazem 240 MG 24 hr capsule Commonly known as:  CARDIZEM CD Take 1 capsule (240 mg total) by mouth daily. What changed:  medication strength  how much to take   feeding supplement (NEPRO CARB STEADY) Liqd Take 237 mLs by mouth 2 (two) times daily between meals.   metoprolol succinate 25 MG 24 hr tablet Commonly known as:  TOPROL-XL Take 1 tablet (25 mg total) by mouth daily.   traMADol 50 MG tablet Commonly known as:  ULTRAM Take 1-2 tablets (50-100 mg total) by mouth every 6 (six) hours as needed for moderate pain or severe pain.            Discharge Care Instructions        Start     Ordered   06/04/17 0000  diltiazem (CARDIZEM CD) 240 MG 24 hr capsule  Daily      06/04/17 1218   06/04/17 0000  Discharge instructions    Comments:  You were admitted to the hospital with atrial fibrillation with rapid ventricular rate. Your heart rate was too high. It was lowered to an appropriate rate with an increase in your diltiazem. Please continue to take your medications daily. You should schedule an appointment with your regular doctor and cardiologist with the information provided at discharge. You are being discharged home with physical therapy and a home health aid.   06/04/17 1218   06/04/17 0000  Activity as tolerated - No restrictions     06/04/17 1218      Discharge Instructions: Please refer to Patient Instructions section of EMR for full details.  Patient was counseled important signs and symptoms that should prompt return to medical care, changes in medications, dietary instructions, activity restrictions, and follow up appointments.   Follow-Up Appointments: Follow-up Information    Hamrick, Durward Fortes, MD. Schedule an appointment as soon as possible for a visit in 1 week(s).   Specialty:  Family Medicine Contact information: [address] [place] [address] [phone]        Wendall Stade, MD. Schedule an appointment as soon as possible for a visit in 1 week(s).   Specialty:  Cardiology Why:  Follow up on Atrial Fibrillation Contact information: [address] [place] [address] [phone]        Care, [facility] Follow up.   Specialty:  Home Health Services Why:  Registered Nurse, Physical/ Occupational Therapy, Social Worker, Aide. Start of care to begin Wednesday 8-29 after HD.  Contact information: [address] [place] [address] [phone]           Garnette Gunner, MD 06/06/2017, 10:21 PM PGY-1, [facility] Family Medicine

## 2017-08-24 ENCOUNTER — Emergency Department (HOSPITAL_COMMUNITY): Payer: Medicare Other

## 2017-08-24 ENCOUNTER — Other Ambulatory Visit: Payer: Self-pay

## 2017-08-24 ENCOUNTER — Emergency Department (HOSPITAL_COMMUNITY)
Admission: EM | Admit: 2017-08-24 | Discharge: 2017-08-24 | Disposition: A | Discharge status: Home / Self Care | Payer: Medicare Other | Attending: Emergency Medicine | Admitting: Emergency Medicine

## 2017-08-24 ENCOUNTER — Encounter (HOSPITAL_COMMUNITY): Payer: Self-pay

## 2017-08-24 DIAGNOSIS — I120 Hypertensive chronic kidney disease with stage 5 chronic kidney disease or end stage renal disease: Secondary | ICD-10-CM | POA: Insufficient documentation

## 2017-08-24 DIAGNOSIS — S098XXA Other specified injuries of head, initial encounter: Secondary | ICD-10-CM | POA: Diagnosis present

## 2017-08-24 DIAGNOSIS — Y999 Unspecified external cause status: Secondary | ICD-10-CM | POA: Diagnosis not present

## 2017-08-24 DIAGNOSIS — W000XXA Fall on same level due to ice and snow, initial encounter: Secondary | ICD-10-CM | POA: Diagnosis not present

## 2017-08-24 DIAGNOSIS — M25512 Pain in left shoulder: Secondary | ICD-10-CM | POA: Insufficient documentation

## 2017-08-24 DIAGNOSIS — S5002XA Contusion of left elbow, initial encounter: Secondary | ICD-10-CM | POA: Diagnosis not present

## 2017-08-24 DIAGNOSIS — M79605 Pain in left leg: Secondary | ICD-10-CM | POA: Insufficient documentation

## 2017-08-24 DIAGNOSIS — Y939 Activity, unspecified: Secondary | ICD-10-CM | POA: Diagnosis not present

## 2017-08-24 DIAGNOSIS — N186 End stage renal disease: Secondary | ICD-10-CM | POA: Diagnosis not present

## 2017-08-24 DIAGNOSIS — M25561 Pain in right knee: Secondary | ICD-10-CM | POA: Diagnosis not present

## 2017-08-24 DIAGNOSIS — W19XXXA Unspecified fall, initial encounter: Secondary | ICD-10-CM

## 2017-08-24 DIAGNOSIS — Z992 Dependence on renal dialysis: Secondary | ICD-10-CM | POA: Diagnosis not present

## 2017-08-24 DIAGNOSIS — Y929 Unspecified place or not applicable: Secondary | ICD-10-CM | POA: Diagnosis not present

## 2017-08-24 DIAGNOSIS — F17210 Nicotine dependence, cigarettes, uncomplicated: Secondary | ICD-10-CM | POA: Diagnosis not present

## 2017-08-24 DIAGNOSIS — S0003XA Contusion of scalp, initial encounter: Secondary | ICD-10-CM

## 2017-08-24 DIAGNOSIS — Z79899 Other long term (current) drug therapy: Secondary | ICD-10-CM | POA: Diagnosis not present

## 2017-08-24 MED ORDER — ACETAMINOPHEN 325 MG PO TABS
650.0000 mg | ORAL_TABLET | Freq: Once | ORAL | Status: AC
  Administered 2017-08-24: 650 mg via ORAL
  Filled 2017-08-24: qty 2

## 2017-08-24 NOTE — ED Triage Notes (Signed)
Pt arrived via [facility] from home where pt had a fall today r/t slick/icy ramp. Pt is dialysis pt and was going to his apt. When he fell. Pt report pain in head, denies LOC, pain in left hip, left arm, and right knee.

## 2017-08-24 NOTE — ED Notes (Signed)
Gave pt [place] sandwich bag per doctor.

## 2017-08-24 NOTE — ED Provider Notes (Addendum)
[facility] Provider Note   CSN: [id number] Arrival date & time: 08/24/17  [id number]     History   Chief Complaint Chief Complaint  Patient presents with  . Fall    HPI Shane [name] is a 76 y.o. male.  Patient is a 76 year old male with a history of end-stage renal disease on dialysis Monday Wednesday Friday, hypertension, atrial fibrillation not on anticoagulation presenting today after a mechanical fall.  Patient was leaving his home to go to his dialysis unit when he slipped on ice and fell on his porch.  He fell backwards onto his left side and hit his head on the wood.  Because it was so slick he did not try to get up but called 911.  He denies loss of consciousness.  He has some pain in his left shoulder, femur and elbow.  He is also complaining of pain in his right knee.  He denies any chest pain, shortness of breath or abdominal pain.  It was his normal dialysis today and he is asymptomatic in that regard.   The history is provided by the patient.  Fall  This is a new problem. The current episode started 1 to 2 hours ago. The problem occurs constantly. The problem has not changed since onset.Associated symptoms include headaches. Associated symptoms comments: Left shoulder, elbow, femur and right ear pain.  Pain where he hit his head but no LOC.  No anticoagulation.. The symptoms are aggravated by bending and walking. Nothing relieves the symptoms. He has tried nothing for the symptoms. The treatment provided no relief.    Past Medical History:  Diagnosis Date  . 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 Arthritis    bursitis in hip  . Bilateral inguinal hernia (BIH)    right > left side  . 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 Reynolds Bowloldonato  . Elevated troponin 04/12/2015  . Full dentures   . Hypertension   . Tobacco abuse   . Urinary tract  infection due to Enterococcus 08/03/2015  . Varicocele     Patient Active Problem List   Diagnosis Date Noted  . Atrial fibrillation (HCC) 06/01/2017  . A-fib (HCC) 06/01/2017  . Atrial fibrillation with RVR (HCC) 06/29/2016  . Adjustment disorder with other symptom   . Atrial fibrillation with rapid ventricular response (HCC) 06/28/2016  . Toxic metabolic encephalopathy 12/01/2015  . Bilateral inguinal hernia (BIH) s/p lap repair w mesh 11/25/2015 11/25/2015  . Hydrocele s/p partial rexction/unroofing 11/25/2015 11/25/2015  . 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 Pulmonary hypertension (HCC) 08/04/2015  . Essential hypertension   . Acute encephalopathy 04/12/2015  . ESRD on dialysis (HCC) 04/12/2015  . Anorexia 04/12/2015  . Amyloidosis (HCC) 04/12/2015  . Uremia 04/12/2015  . Tobacco abuse 02/09/2013  . Hypertension 02/09/2013    Past Surgical History:  Procedure Laterality Date  . AV FISTULA PLACEMENT, BRACHIOCEPHALIC  10/19/2010   LEFT  . AV FISTULA PLACEMENT, RADIOCEPHALIC  01/30/2011   Right w/ ligation of competing venous branch by Dr. Leonides SakeBrian Chen  . AV FISTULA PLACEMENT, RADIOCEPHALIC  09/13/2010   LEFT  . BASILIC VEIN TRANSPOSITION Left 04/08/2015   Performed by Pryor OchoaLawson, James D, MD at [facility]  . COLONOSCOPY    . INSERTION OF MESH Bilateral 11/25/2015   Performed by Karie SodaGross, Steven, MD at [facility] OR  .  LAPAROSCOPIC BILATERAL INGUINAL HERNIA REPAIR--EXCISION OF RIGHT HYDROCELE Bilateral 11/25/2015   Performed by Karie Soda, MD at [facility] OR  . MULTIPLE TOOTH EXTRACTIONS         Home Medications    Prior to Admission medications   Medication Sig Start Date End Date Taking? Authorizing Provider  albuterol-ipratropium (COMBIVENT) 18-103 MCG/ACT inhaler Inhale 2 puffs into the lungs every 6 (six) hours as needed for wheezing. Patient not taking: Reported on 04/23/2017 02/11/13   Genelle Gather, MD  ammonium lactate (AMLACTIN) 12 % cream Apply 1  application topically daily. 03/20/17   [provider]  aspirin EC 325 MG tablet Take 1 tablet (325 mg total) by mouth daily. Your may choose to take baby aspirin (81mg ) three tabs daily, instead the 325mg  tabs daily. Patient not taking: Reported on 04/23/2017 06/29/16   Albertine Grates, MD  betamethasone valerate (VALISONE) 0.1 % cream Apply 1 application topically daily. 03/22/17   [provider]  calcium acetate (PHOSLO) 667 MG capsule Take 2,001 mg by mouth 3 (three) times daily with meals.     [provider]  diltiazem (CARDIZEM CD) 240 MG 24 hr capsule Take 1 capsule (240 mg total) by mouth daily. 06/04/17 07/04/17  Garnette Gunner, MD  metoprolol succinate (TOPROL-XL) 25 MG 24 hr tablet Take 1 tablet (25 mg total) by mouth daily. 04/23/17   Shaune Pollack, MD  Nutritional Supplements (FEEDING SUPPLEMENT, NEPRO CARB STEADY,) LIQD Take 237 mLs by mouth 2 (two) times daily between meals. Patient not taking: Reported on 04/23/2017 12/01/15   Maretta Bees, MD  traMADol (ULTRAM) 50 MG tablet Take 1-2 tablets (50-100 mg total) by mouth every 6 (six) hours as needed for moderate pain or severe pain. Patient not taking: Reported on 04/23/2017 11/25/15   Karie Soda, MD    Family History Family History  Problem Relation Age of Onset  . Asthma Mother   . Hypertension Mother   . Alcohol abuse Father   . Hypertension Father   . Hypertension Sister   . Diabetes Sister   . Heart disease Sister   . Hypertension Brother   . Peripheral vascular disease Brother   . Hypertension Daughter   . Heart disease Daughter     Social History Social History   Tobacco Use  . Smoking status: Current Every Day Smoker    Packs/day: 1.00    Years: 50.00    Pack years: 50.00    Types: Cigarettes  . Smokeless tobacco: Never Used  Substance Use Topics  . Alcohol use: No    Alcohol/week: 0.0 oz  . Drug use: No     Allergies   Patient has no known allergies.   Review of  Systems Review of Systems  Musculoskeletal: Negative for neck pain.  Neurological: Positive for headaches.  All other systems reviewed and are negative.    Physical Exam Updated Vital Signs BP 112/75 (BP Location: Right Arm)   Pulse 73   Temp 98.1 F (36.7 C) (Oral)   Resp 19   Ht 5\' 10"  (1.778 m)   Wt 64.4 kg (142 lb)   SpO2 99%   BMI 20.37 kg/m   Physical Exam  Constitutional: He is oriented to person, place, and time. He appears well-developed and well-nourished. No distress.  HENT:  Head: Normocephalic and atraumatic.    Right Ear: Tympanic membrane normal.  Left Ear: Tympanic membrane normal.  Mouth/Throat: Oropharynx is clear and moist.  Eyes: Conjunctivae and EOM are normal. Pupils  are equal, round, and reactive to light.  Neck: Normal range of motion. Neck supple. No spinous process tenderness and no muscular tenderness present. No neck rigidity. Normal range of motion present.  Cardiovascular: Normal rate, regular rhythm and intact distal pulses.  No murmur heard. Pulmonary/Chest: Effort normal and breath sounds normal. No respiratory distress. He has no wheezes. He has no rales.  Abdominal: Soft. He exhibits no distension. There is no tenderness. There is no rebound and no guarding.  Musculoskeletal: Normal range of motion. He exhibits tenderness. He exhibits no edema.       Left elbow: He exhibits swelling. He exhibits normal range of motion. Tenderness found. Olecranon process tenderness noted.       Right hip: Normal.       Left hip: Normal.       Right knee: He exhibits normal range of motion, no swelling and no ecchymosis. Tenderness found. Medial joint line and lateral joint line tenderness noted.       Left upper leg: He exhibits tenderness and bony tenderness. He exhibits no deformity.  Neurological: He is alert and oriented to person, place, and time.  Skin: Skin is warm and dry. No rash noted. No erythema.  Psychiatric: He has a normal mood and affect.  His behavior is normal.  Nursing note and vitals reviewed.    ED Treatments / Results  Labs (all labs ordered are listed, but only abnormal results are displayed) Labs Reviewed - No data to display  EKG  EKG Interpretation None       Radiology Dg Elbow Complete Left  Result Date: 08/24/2017 CLINICAL DATA:  Left elbow pain after fall today. EXAM: LEFT ELBOW - COMPLETE 3+ VIEW COMPARISON:  None. FINDINGS: There is no evidence of fracture, dislocation, or joint effusion. There is no evidence of arthropathy or other focal bone abnormality. Surgical staples are seen medial to distal humerus. IMPRESSION: No acute abnormality seen in the left elbow. Electronically Signed   By: Lupita RaiderJames  Green Jr, M.D.   On: 08/24/2017 10:15   Dg Knee 2 Views Right  Result Date: 08/24/2017 CLINICAL DATA:  Right knee pain after fall today. EXAM: RIGHT KNEE - 1-2 VIEW COMPARISON:  None. FINDINGS: No evidence of fracture, dislocation, or joint effusion. No evidence of arthropathy or other focal bone abnormality. Soft tissues are unremarkable. IMPRESSION: Normal right knee. Electronically Signed   By: Lupita RaiderJames  Green Jr, M.D.   On: 08/24/2017 10:10   Ct Head Wo Contrast  Result Date: 08/24/2017 CLINICAL DATA:  Fall, headache, trauma EXAM: CT HEAD WITHOUT CONTRAST TECHNIQUE: Contiguous axial images were obtained from the base of the skull through the vertex without intravenous contrast. COMPARISON:  11/30/2015 FINDINGS: Brain: Stable brain atrophy pattern and chronic white matter microvascular ischemic changes throughout both cerebral hemispheres. No acute intracranial hemorrhage, mass lesion, new infarction, midline shift, herniation, hydrocephalus, or extra-axial fluid collection. No focal mass effect or edema. Cisterns are patent. Cerebellar atrophy as well. Vascular: Intracranial atherosclerosis noted.  No hyperdense vessel. Skull: Normal. Negative for fracture or focal lesion. Sinuses/Orbits: No acute finding.  Other: None. IMPRESSION: Stable brain atrophy and chronic white matter microvascular ischemic changes. No significant interval change or acute process by noncontrast CT. Electronically Signed   By: Judie PetitM.  Shick M.D.   On: 08/24/2017 11:26   Dg Shoulder Left  Result Date: 08/24/2017 CLINICAL DATA:  Left shoulder pain after fall today. EXAM: LEFT SHOULDER - 2+ VIEW COMPARISON:  None. FINDINGS: There is no evidence of fracture  or dislocation. There is no evidence of arthropathy or other focal bone abnormality. Soft tissues are unremarkable. IMPRESSION: Normal left shoulder. Electronically Signed   By: Lupita Raider, M.D.   On: 08/24/2017 10:12   Dg Femur Min 2 Views Left  Result Date: 08/24/2017 CLINICAL DATA:  Left hip pain after fall today. EXAM: LEFT FEMUR 2 VIEWS COMPARISON:  None. FINDINGS: There is no evidence of fracture or other focal bone lesions. Vascular calcifications are noted. IMPRESSION: Normal left femur. Electronically Signed   By: Lupita Raider, M.D.   On: 08/24/2017 10:09    Procedures Procedures (including critical care time)  Medications Ordered in ED Medications  acetaminophen (TYLENOL) tablet 650 mg (not administered)     Initial Impression / Assessment and Plan / ED Course  I have reviewed the triage vital signs and the nursing notes.  Pertinent labs & imaging results that were available during my care of the patient were reviewed by me and considered in my medical decision making (see chart for details).     Patient presenting after mechanical fall with complaints of pain in extremities and injury to his head.  On exam patient is in no acute distress and well-appearing.  He has no cervical or spinal injury at this time.  He is neurovascularly intact.  He is not anticoagulated.  Head CT pending.  Images of the left shoulder, elbow, femur and right knee are all within normal limits.  Vital signs are within normal limits.  Need to discuss with case management about  ensuring patient can get his dialysis today.  Will attempt to contact [place] dialysis unit.  CT head wnl.  Pt still doing well.  Discussed with his dialysis unit and he can be dialyzed there today.  His family is coming to get him.  Final Clinical Impressions(s) / ED Diagnoses   Final diagnoses:  Fall, initial encounter  Contusion of scalp, initial encounter  Contusion of left elbow, initial encounter    ED Discharge Orders    None       Gwyneth Sprout, MD 08/24/17 1129    Gwyneth Sprout, MD 08/24/17 1157

## 2017-08-24 NOTE — ED Notes (Signed)
Family at bedside. 

## 2017-08-24 NOTE — Discharge Planning (Signed)
[facility]  EDCM contacted [facility] to find that pt can be dialyzed when he arrives today.  EDCM confirmed that pt family can take him to dialysis upon discharge today.

## 2017-08-24 NOTE — ED Notes (Signed)
Patient transported to X-ray 

## 2017-08-24 NOTE — Discharge Instructions (Signed)
Will be very sore over the next 2-3 days.  Take Tylenol as needed for the aches and pains as well as hot shower or heating pad.

## 2017-10-12 ENCOUNTER — Emergency Department (HOSPITAL_COMMUNITY): Payer: Medicare Other

## 2017-10-12 ENCOUNTER — Emergency Department (HOSPITAL_COMMUNITY)
Admission: EM | Admit: 2017-10-12 | Discharge: 2017-10-12 | Disposition: A | Discharge status: Home / Self Care | Payer: Medicare Other | Attending: Emergency Medicine | Admitting: Emergency Medicine

## 2017-10-12 ENCOUNTER — Encounter (HOSPITAL_COMMUNITY): Payer: Self-pay | Admitting: *Deleted

## 2017-10-12 DIAGNOSIS — I120 Hypertensive chronic kidney disease with stage 5 chronic kidney disease or end stage renal disease: Secondary | ICD-10-CM | POA: Diagnosis not present

## 2017-10-12 DIAGNOSIS — R42 Dizziness and giddiness: Secondary | ICD-10-CM | POA: Diagnosis not present

## 2017-10-12 DIAGNOSIS — Z79899 Other long term (current) drug therapy: Secondary | ICD-10-CM | POA: Insufficient documentation

## 2017-10-12 DIAGNOSIS — N186 End stage renal disease: Secondary | ICD-10-CM | POA: Insufficient documentation

## 2017-10-12 DIAGNOSIS — F17210 Nicotine dependence, cigarettes, uncomplicated: Secondary | ICD-10-CM | POA: Diagnosis not present

## 2017-10-12 DIAGNOSIS — R079 Chest pain, unspecified: Secondary | ICD-10-CM | POA: Diagnosis present

## 2017-10-12 DIAGNOSIS — J189 Pneumonia, unspecified organism: Secondary | ICD-10-CM | POA: Insufficient documentation

## 2017-10-12 DIAGNOSIS — I480 Paroxysmal atrial fibrillation: Secondary | ICD-10-CM | POA: Insufficient documentation

## 2017-10-12 DIAGNOSIS — Z7982 Long term (current) use of aspirin: Secondary | ICD-10-CM | POA: Diagnosis not present

## 2017-10-12 LAB — BASIC METABOLIC PANEL
Anion gap: 13 (ref 5–15)
BUN: 27 mg/dL — AB (ref 6–20)
CALCIUM: 8.4 mg/dL — AB (ref 8.9–10.3)
CO2: 30 mmol/L (ref 22–32)
Chloride: 96 mmol/L — ABNORMAL LOW (ref 101–111)
Creatinine, Ser: 8.06 mg/dL — ABNORMAL HIGH (ref 0.61–1.24)
GFR calc Af Amer: 7 mL/min — ABNORMAL LOW (ref >60)
GFR, EST NON AFRICAN AMERICAN: 6 mL/min — AB (ref >60)
GLUCOSE: 92 mg/dL (ref 65–99)
Potassium: 3.5 mmol/L (ref 3.5–5.1)
Sodium: 139 mmol/L (ref 135–145)

## 2017-10-12 LAB — HEPATIC FUNCTION PANEL
ALT: 7 U/L — ABNORMAL LOW (ref 17–63)
AST: 13 U/L — AB (ref 15–41)
Albumin: 3.4 g/dL — ABNORMAL LOW (ref 3.5–5.0)
Alkaline Phosphatase: 64 U/L (ref 38–126)
BILIRUBIN DIRECT: 0.3 mg/dL (ref 0.1–0.5)
BILIRUBIN TOTAL: 1.4 mg/dL — AB (ref 0.3–1.2)
Indirect Bilirubin: 1.1 mg/dL — ABNORMAL HIGH (ref 0.3–0.9)
Total Protein: 6.3 g/dL — ABNORMAL LOW (ref 6.5–8.1)

## 2017-10-12 LAB — BRAIN NATRIURETIC PEPTIDE: B Natriuretic Peptide: >4500 pg/mL — ABNORMAL HIGH (ref 0.0–100.0)

## 2017-10-12 LAB — CBC
HEMATOCRIT: 34.4 % — AB (ref 39.0–52.0)
Hemoglobin: 10.7 g/dL — ABNORMAL LOW (ref 13.0–17.0)
MCH: 32 pg (ref 26.0–34.0)
MCHC: 31.1 g/dL (ref 30.0–36.0)
MCV: 103 fL — ABNORMAL HIGH (ref 78.0–100.0)
Platelets: 101 10*3/uL — ABNORMAL LOW (ref 150–400)
RBC: 3.34 MIL/uL — ABNORMAL LOW (ref 4.22–5.81)
RDW: 14.1 % (ref 11.5–15.5)
WBC: 4.3 10*3/uL (ref 4.0–10.5)

## 2017-10-12 LAB — I-STAT TROPONIN, ED: TROPONIN I, POC: 0.06 ng/mL (ref 0.00–0.08)

## 2017-10-12 MED ORDER — IOPAMIDOL (ISOVUE-300) INJECTION 61%
INTRAVENOUS | Status: AC
  Administered 2017-10-12: 75 mL
  Filled 2017-10-12: qty 75

## 2017-10-12 MED ORDER — DOXYCYCLINE HYCLATE 100 MG PO CAPS: 100.0000 mg | ORAL_CAPSULE | Freq: Two times a day (BID) | ORAL | 0 refills | Status: AC

## 2017-10-12 MED ORDER — AMOXICILLIN-POT CLAVULANATE 500-125 MG PO TABS: 500.0000 mg | ORAL_TABLET | ORAL | 0 refills | Status: AC

## 2017-10-12 NOTE — ED Provider Notes (Signed)
[facility] EMERGENCY DEPARTMENT Provider Note   CSN: [id number] Arrival date & time: 10/12/17  1050     History   Chief Complaint Chief Complaint  Patient presents with  . Chest Pain    HPI Shane [name] is a 77 y.o. 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 Fib with RVR not on anticoagulation, HTN, tobacco abuse who presents to the emergency department via EMS from HD with a chief complaint of chest pain.  The patient reports a 10-minute episode of squeezing chest pain with associated lightheadedness and dyspnea that began approximately 30 minutes into dialysis. He states "it felt like Brandy Hale was picking me up and squeezing the inside of my chest, and I felt like I was gasping for air."  The chest pain, dyspnea, and lighheadedness resolved spontaneously.    He reports a second episode occurred in route to the hospital with EMS that was shorter in duration.  EMS reports the patient was wheezing on arrival and was given 2 albuterol nebulizer treatments in route.  CBG 118, and the patient was noted to be an A. fib with a heart rate fluctuating between 90 and 120 on the monitor.   He also endorses right-sided abdominal pain for the last 3-4 days that has since resolved. No associated nausea, vomiting, diarrhea, constipation, back pain, melena, or hematochezia.  He is unable to provide further information about the pain.  He endorses pain lower extremity pain. No swelling, redness or warmth.   He is a current everyday smoker. ECHo on 09/17 with EF 55-60% and grade 2 diastolic dysfunction.  The history is provided by the patient. No language interpreter was used.    Past Medical History:  Diagnosis Date  . 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 Arthritis    bursitis in hip  . Bilateral inguinal hernia (BIH)    right > left side  . Chronic kidney disease    secondary to amyloidosis AL lambda  phenotype, HD MWF Dr. Reynolds Bowl  . Elevated troponin 04/12/2015  . Full dentures   . Hypertension   . Tobacco abuse   . Urinary tract infection due to Enterococcus 08/03/2015  . Varicocele     Patient Active Problem List   Diagnosis Date Noted  . Atrial fibrillation (HCC) 06/01/2017  . A-fib (HCC) 06/01/2017  . Atrial fibrillation with RVR (HCC) 06/29/2016  . Adjustment disorder with other symptom   . Atrial fibrillation with rapid ventricular response (HCC) 06/28/2016  . Toxic metabolic encephalopathy 12/01/2015  . Bilateral inguinal hernia (BIH) s/p lap repair w mesh 11/25/2015 11/25/2015  . Hydrocele s/p partial rexction/unroofing 11/25/2015 11/25/2015  . 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 Pulmonary hypertension (HCC) 08/04/2015  . Essential hypertension   . Acute encephalopathy 04/12/2015  . ESRD on dialysis (HCC) 04/12/2015  . Anorexia 04/12/2015  . Amyloidosis (HCC) 04/12/2015  . Uremia 04/12/2015  . Tobacco abuse 02/09/2013  . Hypertension 02/09/2013    Past Surgical History:  Procedure Laterality Date  . AV FISTULA PLACEMENT, BRACHIOCEPHALIC  10/19/2010   LEFT  . AV FISTULA PLACEMENT, RADIOCEPHALIC  01/30/2011   Right w/ ligation of competing venous branch by Dr. Leonides Sake  . AV FISTULA PLACEMENT, RADIOCEPHALIC  09/13/2010   LEFT  . BASCILIC VEIN TRANSPOSITION Left 04/08/2015   Procedure: BASILIC VEIN TRANSPOSITION;  Surgeon: Pryor Ochoa, MD;  Location: [facility] OR;  Service: Vascular;  Laterality: Left;  . COLONOSCOPY    . INGUINAL HERNIA REPAIR Bilateral 11/25/2015   Procedure: LAPAROSCOPIC BILATERAL INGUINAL HERNIA REPAIR--EXCISION OF RIGHT HYDROCELE;  Surgeon: Karie Soda, MD;  Location: [facility] OR;  Service: General;  Laterality: Bilateral;  . INSERTION OF MESH Bilateral 11/25/2015   Procedure: INSERTION OF MESH;  Surgeon: Karie Soda, MD;  Location: [facility] OR;  Service: General;  Laterality: Bilateral;  . MULTIPLE TOOTH EXTRACTIONS           Home Medications    Prior to Admission medications   Medication Sig Start Date End Date Taking? Authorizing Provider  albuterol-ipratropium (COMBIVENT) 18-103 MCG/ACT inhaler Inhale 2 puffs into the lungs every 6 (six) hours as needed for wheezing. Patient not taking: Reported on 04/23/2017 02/11/13   Genelle Gather, MD  ammonium lactate (AMLACTIN) 12 % cream Apply 1 application topically daily. 03/20/17   [provider]  amoxicillin-clavulanate (AUGMENTIN) 500-125 MG tablet Take 1 tablet (500 mg total) by mouth daily for 7 days. 10/12/17 10/19/17  Jeramyah Goodpasture A, PA-C  aspirin EC 81 MG tablet Take 81 mg by mouth daily.    [provider]  betamethasone valerate (VALISONE) 0.1 % cream Apply 1 application topically daily. 03/22/17   [provider]  calcium acetate (PHOSLO) 667 MG capsule Take 2,001 mg by mouth 3 (three) times daily with meals.     [provider]  diltiazem (CARDIZEM CD) 240 MG 24 hr capsule Take 240 mg by mouth daily.    [provider]  doxycycline (VIBRAMYCIN) 100 MG capsule Take 1 capsule (100 mg total) by mouth 2 (two) times daily for 7 days. 10/12/17 10/19/17  Takako Minckler A, PA-C  metoprolol succinate (TOPROL-XL) 25 MG 24 hr tablet Take 1 tablet (25 mg total) by mouth daily. 04/23/17   Shaune Pollack, MD  Nutritional Supplements (FEEDING SUPPLEMENT, NEPRO CARB STEADY,) LIQD Take 237 mLs by mouth 2 (two) times daily between meals. Patient not taking: Reported on 04/23/2017 12/01/15   Maretta Bees, MD  traMADol (ULTRAM) 50 MG tablet Take 1-2 tablets (50-100 mg total) by mouth every 6 (six) hours as needed for moderate pain or severe pain. Patient not taking: Reported on 04/23/2017 11/25/15   Karie Soda, MD    Family History Family History  Problem Relation Age of Onset  . Asthma Mother   . Hypertension Mother   . Alcohol abuse Father   . Hypertension Father   . Hypertension Sister   . Diabetes Sister   . Heart  disease Sister   . Hypertension Brother   . Peripheral vascular disease Brother   . Hypertension Daughter   . Heart disease Daughter     Social History Social History   Tobacco Use  . Smoking status: Current Every Day Smoker    Packs/day: 1.00    Years: 50.00    Pack years: 50.00    Types: Cigarettes  . Smokeless tobacco: Never Used  Substance Use Topics  . Alcohol use: No    Alcohol/week: 0.0 oz  . Drug use: No     Allergies   Patient has no known allergies.   Review of Systems Review of Systems  Constitutional: Negative for activity change, chills and fever.  Respiratory: Positive for chest tightness and shortness of breath.   Cardiovascular: Positive for chest pain.  Gastrointestinal: Positive for abdominal pain. Negative for diarrhea, nausea and vomiting.  Musculoskeletal: Positive for arthralgias and myalgias. Negative for back pain.  Skin: Negative for  rash.  Allergic/Immunologic: Positive for immunocompromised state.     Physical Exam Updated Vital Signs BP (!) 139/94   Pulse 84   Temp 97.9 F (36.6 C) (Oral)   Resp 20   Ht 5\' 11"  (1.803 m)   Wt 64 kg (141 lb 1.5 oz)   SpO2 97%   BMI 19.68 kg/m   Physical Exam  Constitutional: He appears well-developed.  Elderly, chronically ill-appearing male.  Nontoxic.  HENT:  Head: Normocephalic.  Eyes: Conjunctivae are normal.  Neck: Normal range of motion. Neck supple.  Cardiovascular: Normal rate, normal heart sounds and intact distal pulses. An irregularly irregular rhythm present. Exam reveals no gallop and no friction rub.  No murmur heard. Pulses:      Radial pulses are 2+ on the right side, and 2+ on the left side.       Dorsalis pedis pulses are 2+ on the right side, and 2+ on the left side.       Posterior tibial pulses are 2+ on the right side, and 2+ on the left side.  Pulmonary/Chest: Effort normal. No accessory muscle usage or stridor. No respiratory distress. He has wheezes in the right upper  field, the right middle field, the right lower field, the left upper field, the left middle field and the left lower field.  Diffuse expiratory wheezes in all fields.   Abdominal: Soft. Bowel sounds are normal. He exhibits no distension and no mass. There is no tenderness. There is no rebound and no guarding. No hernia.  Unremarkable abdominal exam.  No peritoneal signs.  No CVA tenderness bilaterally.  Negative Murphy sign.  No tenderness over McBurney's point.  Musculoskeletal:       Right lower leg: He exhibits tenderness.       Left lower leg: He exhibits tenderness.  Left AC fistula.   Neurological: He is alert.  Skin: Skin is warm and dry. Capillary refill takes less than 2 seconds. No rash noted. He is not diaphoretic. No erythema. No pallor.  Psychiatric: His behavior is normal.  Nursing note and vitals reviewed.  ED Treatments / Results  Labs (all labs ordered are listed, but only abnormal results are displayed) Labs Reviewed  BASIC METABOLIC PANEL - Abnormal; Notable for the following components:      Result Value   Chloride 96 (*)    BUN 27 (*)    Creatinine, Ser 8.06 (*)    Calcium 8.4 (*)    GFR calc non Af Amer 6 (*)    GFR calc Af Amer 7 (*)    All other components within normal limits  CBC - Abnormal; Notable for the following components:   RBC 3.34 (*)    Hemoglobin 10.7 (*)    HCT 34.4 (*)    MCV 103.0 (*)    Platelets 101 (*)    All other components within normal limits  HEPATIC FUNCTION PANEL - Abnormal; Notable for the following components:   Total Protein 6.3 (*)    Albumin 3.4 (*)    AST 13 (*)    ALT 7 (*)    Total Bilirubin 1.4 (*)    Indirect Bilirubin 1.1 (*)    All other components within normal limits  BRAIN NATRIURETIC PEPTIDE - Abnormal; Notable for the following components:   B Natriuretic Peptide >4,500.0 (*)    All other components within normal limits  I-STAT TROPONIN, ED    EKG  EKG Interpretation  Date/Time:  Friday October 12 2017 11:05:05  EST Ventricular Rate:  96 PR Interval:    QRS Duration: 136 QT Interval:  397 QTC Calculation: 502 R Axis:   -37 Text Interpretation:  Unknown rhythm, irregular rate Borderline short PR interval Right bundle branch block Confirmed by Margarita Grizzle [phone]) on 10/12/2017 12:29:25 PM       Radiology Dg Chest 2 View  Result Date: 10/12/2017 CLINICAL DATA:  Generalized chest pressure and pain. EXAM: CHEST  2 VIEW COMPARISON:  04/24/2017 FINDINGS: Mediastinal contours appear intact. Calcific atherosclerotic disease of the aorta. Enlarged cardiac silhouette. There is no evidence of pleural effusion or pneumothorax. Patchy infiltrate in the posterior left lung base. Osseous structures are without acute abnormality. Soft tissues are grossly normal. IMPRESSION: Patchy infiltrate in the posterior left lung base. This may represent infectious airspace consolidation, atelectasis or potentially subpleural pulmonary nodules. Repeated chest radiograph after empiric treatment may be considered to assure resolution. Electronically Signed   By: Ted Mcalpine M.D.   On: 10/12/2017 12:38   Ct Chest W Contrast  Result Date: 10/12/2017 CLINICAL DATA:  Chest pain and wheezing EXAM: CT CHEST WITH CONTRAST TECHNIQUE: Multidetector CT imaging of the chest was performed during intravenous contrast administration. CONTRAST:  75mL ISOVUE-300 IOPAMIDOL (ISOVUE-300) INJECTION 61% COMPARISON:  Radiography same day FINDINGS: Cardiovascular: Pulmonary arterial opacification is good. There are no pulmonary emboli. There is aortic atherosclerosis but no aneurysm or dissection. Branching pattern of the brachiocephalic vessels from the arch is normal without origin stenosis. There is mild coronary artery calcification. Heart size is normal, but there is relative prominence of the right atrium. No pericardial effusion. Mediastinum/Nodes: No mass or lymphadenopathy. Lungs/Pleura: No pleural effusion. Mild bronchial  thickening in both lower lobes left more than right. Patchy density at the left base consistent with mild left lower lobe atelectasis/ infiltrate. No consolidation or lobar collapse. Upper Abdomen: Dilated IVC with reflux of contrast, also evidence of possible right heart failure. Small amount of ascites and edema in the soft tissues also suggested. Musculoskeletal: No acute or significant bone finding. IMPRESSION: Bronchial thickening in the lower lobes. Patchy density at the left lung base consistent with mild atelectasis/ pneumonia. Prominent right atrium. Prominent IVC with contrast reflux. Edema of the soft tissues. Small amount of ascites. All these findings suggest an element of right heart failure. Electronically Signed   By: Paulina Fusi M.D.   On: 10/12/2017 16:26    Procedures Procedures (including critical care time)  Medications Ordered in ED Medications  iopamidol (ISOVUE-300) 61 % injection (75 mLs  Contrast Given 10/12/17 1613)     Initial Impression / Assessment and Plan / ED Course  I have reviewed the triage vital signs and the nursing notes.  Pertinent labs & imaging results that were available during my care of the patient were reviewed by me and considered in my medical decision making (see chart for details).     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 Fib with RVR not on anticoagulation, HTN, tobacco abuse with chest pain, dyspnea, and lighheadedness that began while he was at dialysis. A second episode occurred in route with EMS. He is currently symptom free in the ED.   Troponin 0.06. No acute changes on EKG from previous. CBC and BMP are essentially unchanged from previous. No hyperkalemia. CXR with patchy infiltrate in the posterior left lung base concerning for infectious consolidation, atelectasis, or potentially subpleural pulmonary nodules. This appears essentially unchanged from a CXR on 04/23/17. CT chest with contrast  ordered for  further evaluation, which shows bronchial thikcening in the lower lobes; patchy density continues to be consistent with atelectasis/pneumonia. Right atrium is prominent and prominent IVC with contrast reflux, small amount of ascites, and edema of the soft tissues, suggesting right heart failure. BNP elevated to >4500; however BNP would be expected to be elevated at baseline with h/o of amyloidosis. Today's value appears to be more elevated than previously, concerning for worsening heart failure vs fluid overload secondary to shortened dialysis earlier today. Spoke with cardiology regarding the patient's cardiac history regarding patients h/o of amyloidosis with questionable cardiac involvement. Lasix unlikely to provide improvement of symptoms, continued dialysis would improve fluid overload. Recommended to have the patient follow up with his primary care team to determine if cardiology follow up is warranted since the patient has not been seen since 2014. Thanks to cardiology for assistance. Pneumonia likely HAP.  Discussed these findings with the patient who stated that he would like to go home even if that is AGAINST MEDICAL ADVICE. Discussed that pneumonia is HAP and required IV antibiotics. He again declined and understood that this decisions could lead to debilitation and possibly death. Will provide the patient with a course of doxycycline and Augmentin with ESRD dosing as the most appropriate oral antibiotic regiment for CAP since all of the HAP regimens area IV only.   Patient has decided to leave AGAINST MEDICAL ADVICE without further workup.  We discussed the nature and purpose, risks and benefits, as well as, the alternatives of treatment. Time was given to allow the opportunity to ask questions and consider their options, and after the discussion, the patient decided to refuse further testing and/or offerred treatment. The patient was informed that refusal could lead to, but was not limited to,  death, permanent disability, severe pain, worsening symptoms, disease progression. If present, I asked friends/family to dissuade them without success. Prior to refusing, I determined that the patient had the capacity to make their decision, does not appear clinically intoxicated and understood the consequences of that decision. Outpatient follow up has been encouraged and provided as needed.  Recommended patient return to the hospital if symptoms worsen, change or they would like further treatment and evaluation.    Final Clinical Impressions(s) / ED Diagnoses   Final diagnoses:  Recurrent pneumonia    ED Discharge Orders        Ordered    doxycycline (VIBRAMYCIN) 100 MG capsule  2 times daily     10/12/17 1919    amoxicillin-clavulanate (AUGMENTIN) 500-125 MG tablet  Every 24 hours     10/12/17 1929       Winner Valeriano, Coral Else, PA-C 10/13/17 0052    Margarita Grizzle, MD 10/15/17 [phone]

## 2017-10-12 NOTE — ED Notes (Signed)
Patient transported to CT 

## 2017-10-12 NOTE — ED Notes (Signed)
Pt updated to delay. Pt verbalized understanding. Pt states he is anxious to get home but will wait to see what the cardiologist has to say

## 2017-10-12 NOTE — ED Notes (Signed)
Per Irving BurtonEmily, RN, pt refusing to change into gown for assessment at this time. Pt states "I'm going home so I'm not changing."

## 2017-10-12 NOTE — ED Notes (Signed)
Pt granddaughter and POA at the bedside requesting update. Pt visibly irritated and continues to state he is ready to go home. This RN explained delay to pt and and family, verbalized understanding. Awaiting lab results at this time

## 2017-10-12 NOTE — ED Notes (Signed)
IV team at the bedside. Pt changed into gown, pt with pants still on at this time, will continue getting pt undressed for provider assessment when IV team finished

## 2017-10-12 NOTE — ED Notes (Signed)
Per main lab, will add on BNP and HFP.

## 2017-10-12 NOTE — ED Notes (Signed)
Pt. returned from XR. 

## 2017-10-12 NOTE — Discharge Instructions (Addendum)
Call tomorrow morning to schedule a follow-up dialysis appointment as soon as possible.  Take 1 tablet of doxycycline twice daily for the next 7 days. Take one tablet of Augmentin once every 24 hours for the next 7 days. On days when you have hemodialysis, please take your dose during or after your dialysis treatment.  Please call and schedule a follow-up appointment with your primary care provider for a recheck in the next 2-3 days.    It is possible that your symptoms may worsen and that you may need IV antibiotics given in the hospital.  If you develop new or worsening symptoms, including fever, chills, worsening shortness of breath or chest pain, please return to the emergency department for reevaluation.

## 2017-10-12 NOTE — ED Notes (Signed)
ED Provider at bedside. 

## 2017-10-12 NOTE — ED Triage Notes (Signed)
Pt in via EMS, from dialysis center, about 30 minutes into treatment started c/o chest pain, pt wheezing on EMS arrival and given albuterol x2, pain is intermittent, one episode with EMS, pt in afib and has history of same, CBG 118, HR fluctuating between 90-120. Pt alert and oriented on arrival, denies pain, no distress noted.

## 2017-10-12 NOTE — ED Notes (Signed)
Patient transported to X-ray 

## 2017-10-12 NOTE — ED Notes (Signed)
XR notified pt ready for scan 

## 2018-01-25 ENCOUNTER — Emergency Department (HOSPITAL_COMMUNITY): Payer: Medicare Other

## 2018-01-25 ENCOUNTER — Encounter (HOSPITAL_COMMUNITY): Payer: Self-pay | Admitting: Emergency Medicine

## 2018-01-25 ENCOUNTER — Observation Stay (HOSPITAL_COMMUNITY)
Admission: EM | Admit: 2018-01-25 | Discharge: 2018-01-29 | Disposition: A | Discharge status: Home / Self Care | Payer: Medicare Other | Attending: Internal Medicine | Admitting: Internal Medicine

## 2018-01-25 ENCOUNTER — Other Ambulatory Visit: Payer: Self-pay

## 2018-01-25 DIAGNOSIS — R0902 Hypoxemia: Secondary | ICD-10-CM

## 2018-01-25 DIAGNOSIS — R1084 Generalized abdominal pain: Secondary | ICD-10-CM | POA: Diagnosis not present

## 2018-01-25 DIAGNOSIS — D631 Anemia in chronic kidney disease: Secondary | ICD-10-CM | POA: Insufficient documentation

## 2018-01-25 DIAGNOSIS — I700 Atherosclerosis of aorta: Secondary | ICD-10-CM | POA: Insufficient documentation

## 2018-01-25 DIAGNOSIS — R197 Diarrhea, unspecified: Secondary | ICD-10-CM | POA: Diagnosis present

## 2018-01-25 DIAGNOSIS — I120 Hypertensive chronic kidney disease with stage 5 chronic kidney disease or end stage renal disease: Secondary | ICD-10-CM | POA: Diagnosis not present

## 2018-01-25 DIAGNOSIS — F4320 Adjustment disorder, unspecified: Secondary | ICD-10-CM | POA: Diagnosis not present

## 2018-01-25 DIAGNOSIS — K529 Noninfective gastroenteritis and colitis, unspecified: Principal | ICD-10-CM | POA: Insufficient documentation

## 2018-01-25 DIAGNOSIS — I517 Cardiomegaly: Secondary | ICD-10-CM | POA: Insufficient documentation

## 2018-01-25 DIAGNOSIS — J9601 Acute respiratory failure with hypoxia: Secondary | ICD-10-CM | POA: Diagnosis present

## 2018-01-25 DIAGNOSIS — N186 End stage renal disease: Secondary | ICD-10-CM | POA: Insufficient documentation

## 2018-01-25 DIAGNOSIS — Z992 Dependence on renal dialysis: Secondary | ICD-10-CM

## 2018-01-25 DIAGNOSIS — N08 Glomerular disorders in diseases classified elsewhere: Secondary | ICD-10-CM | POA: Diagnosis not present

## 2018-01-25 DIAGNOSIS — F0390 Unspecified dementia without behavioral disturbance: Secondary | ICD-10-CM | POA: Diagnosis not present

## 2018-01-25 DIAGNOSIS — R188 Other ascites: Secondary | ICD-10-CM | POA: Diagnosis not present

## 2018-01-25 DIAGNOSIS — I482 Chronic atrial fibrillation, unspecified: Secondary | ICD-10-CM | POA: Diagnosis present

## 2018-01-25 DIAGNOSIS — I480 Paroxysmal atrial fibrillation: Secondary | ICD-10-CM | POA: Insufficient documentation

## 2018-01-25 DIAGNOSIS — Z720 Tobacco use: Secondary | ICD-10-CM

## 2018-01-25 DIAGNOSIS — F17210 Nicotine dependence, cigarettes, uncomplicated: Secondary | ICD-10-CM | POA: Insufficient documentation

## 2018-01-25 DIAGNOSIS — J441 Chronic obstructive pulmonary disease with (acute) exacerbation: Secondary | ICD-10-CM | POA: Diagnosis not present

## 2018-01-25 DIAGNOSIS — M1990 Unspecified osteoarthritis, unspecified site: Secondary | ICD-10-CM | POA: Insufficient documentation

## 2018-01-25 DIAGNOSIS — R109 Unspecified abdominal pain: Secondary | ICD-10-CM | POA: Diagnosis present

## 2018-01-25 DIAGNOSIS — R0602 Shortness of breath: Secondary | ICD-10-CM

## 2018-01-25 DIAGNOSIS — N2581 Secondary hyperparathyroidism of renal origin: Secondary | ICD-10-CM | POA: Diagnosis not present

## 2018-01-25 DIAGNOSIS — E8889 Other specified metabolic disorders: Secondary | ICD-10-CM | POA: Insufficient documentation

## 2018-01-25 DIAGNOSIS — Z8249 Family history of ischemic heart disease and other diseases of the circulatory system: Secondary | ICD-10-CM | POA: Insufficient documentation

## 2018-01-25 DIAGNOSIS — D696 Thrombocytopenia, unspecified: Secondary | ICD-10-CM | POA: Diagnosis present

## 2018-01-25 DIAGNOSIS — E850 Non-neuropathic heredofamilial amyloidosis: Secondary | ICD-10-CM | POA: Insufficient documentation

## 2018-01-25 DIAGNOSIS — K219 Gastro-esophageal reflux disease without esophagitis: Secondary | ICD-10-CM | POA: Diagnosis not present

## 2018-01-25 DIAGNOSIS — I272 Pulmonary hypertension, unspecified: Secondary | ICD-10-CM | POA: Insufficient documentation

## 2018-01-25 DIAGNOSIS — Z7982 Long term (current) use of aspirin: Secondary | ICD-10-CM | POA: Insufficient documentation

## 2018-01-25 DIAGNOSIS — Z79899 Other long term (current) drug therapy: Secondary | ICD-10-CM | POA: Insufficient documentation

## 2018-01-25 DIAGNOSIS — J90 Pleural effusion, not elsewhere classified: Secondary | ICD-10-CM | POA: Diagnosis not present

## 2018-01-25 DIAGNOSIS — I10 Essential (primary) hypertension: Secondary | ICD-10-CM | POA: Diagnosis present

## 2018-01-25 DIAGNOSIS — Z9221 Personal history of antineoplastic chemotherapy: Secondary | ICD-10-CM | POA: Insufficient documentation

## 2018-01-25 LAB — COMPREHENSIVE METABOLIC PANEL
ALBUMIN: 3.7 g/dL (ref 3.5–5.0)
ALK PHOS: 74 U/L (ref 38–126)
ALT: 8 U/L — ABNORMAL LOW (ref 17–63)
ANION GAP: 19 — AB (ref 5–15)
AST: 15 U/L (ref 15–41)
BUN: 64 mg/dL — AB (ref 6–20)
CO2: 25 mmol/L (ref 22–32)
Calcium: 9 mg/dL (ref 8.9–10.3)
Chloride: 100 mmol/L — ABNORMAL LOW (ref 101–111)
Creatinine, Ser: 13.56 mg/dL — ABNORMAL HIGH (ref 0.61–1.24)
GFR calc Af Amer: 4 mL/min — ABNORMAL LOW (ref >60)
GFR calc non Af Amer: 3 mL/min — ABNORMAL LOW (ref >60)
GLUCOSE: 75 mg/dL (ref 65–99)
POTASSIUM: 4.9 mmol/L (ref 3.5–5.1)
SODIUM: 144 mmol/L (ref 135–145)
Total Bilirubin: 1.5 mg/dL — ABNORMAL HIGH (ref 0.3–1.2)
Total Protein: 7.1 g/dL (ref 6.5–8.1)

## 2018-01-25 LAB — LIPASE, BLOOD: Lipase: 53 U/L — ABNORMAL HIGH (ref 11–51)

## 2018-01-25 LAB — URINALYSIS, ROUTINE W REFLEX MICROSCOPIC
BACTERIA UA: NONE SEEN
Bilirubin Urine: NEGATIVE
Glucose, UA: 50 mg/dL — AB
Hgb urine dipstick: NEGATIVE
Ketones, ur: NEGATIVE mg/dL
Leukocytes, UA: NEGATIVE
Nitrite: NEGATIVE
PROTEIN: 100 mg/dL — AB
Specific Gravity, Urine: 1.014 (ref 1.005–1.030)
pH: 7 (ref 5.0–8.0)

## 2018-01-25 LAB — CBC
HEMATOCRIT: 34.5 % — AB (ref 39.0–52.0)
HEMOGLOBIN: 10.9 g/dL — AB (ref 13.0–17.0)
MCH: 32.3 pg (ref 26.0–34.0)
MCHC: 31.6 g/dL (ref 30.0–36.0)
MCV: 102.4 fL — ABNORMAL HIGH (ref 78.0–100.0)
Platelets: 104 10*3/uL — ABNORMAL LOW (ref 150–400)
RBC: 3.37 MIL/uL — ABNORMAL LOW (ref 4.22–5.81)
RDW: 14.8 % (ref 11.5–15.5)
WBC: 5.3 10*3/uL (ref 4.0–10.5)

## 2018-01-25 MED ORDER — ONDANSETRON HCL 4 MG/2ML IJ SOLN: 4.0000 mg | Freq: Four times a day (QID) | INTRAMUSCULAR | Status: DC | PRN

## 2018-01-25 MED ORDER — FERRIC CITRATE 1 GM 210 MG(FE) PO TABS
420.0000 mg | ORAL_TABLET | Freq: Three times a day (TID) | ORAL | Status: DC
  Administered 2018-01-26 – 2018-01-29 (×8): 420 mg via ORAL
  Filled 2018-01-25 (×12): qty 2

## 2018-01-25 MED ORDER — ONDANSETRON HCL 4 MG PO TABS: 4.0000 mg | ORAL_TABLET | Freq: Four times a day (QID) | ORAL | Status: DC | PRN

## 2018-01-25 MED ORDER — IPRATROPIUM-ALBUTEROL 0.5-2.5 (3) MG/3ML IN SOLN
3.0000 mL | Freq: Once | RESPIRATORY_TRACT | Status: AC
  Administered 2018-01-25: 3 mL via RESPIRATORY_TRACT
  Filled 2018-01-25: qty 3

## 2018-01-25 MED ORDER — ASPIRIN EC 81 MG PO TBEC
81.0000 mg | DELAYED_RELEASE_TABLET | Freq: Every day | ORAL | Status: DC
  Administered 2018-01-25 – 2018-01-29 (×5): 81 mg via ORAL
  Filled 2018-01-25 (×5): qty 1

## 2018-01-25 MED ORDER — NICOTINE 21 MG/24HR TD PT24
21.0000 mg | MEDICATED_PATCH | Freq: Once | TRANSDERMAL | Status: AC
  Administered 2018-01-25: 21 mg via TRANSDERMAL
  Filled 2018-01-25: qty 1

## 2018-01-25 MED ORDER — MORPHINE SULFATE (PF) 4 MG/ML IV SOLN: 1.0000 mg | INTRAVENOUS | Status: DC | PRN

## 2018-01-25 MED ORDER — DILTIAZEM HCL ER COATED BEADS 240 MG PO CP24
240.0000 mg | ORAL_CAPSULE | Freq: Every day | ORAL | Status: DC
  Administered 2018-01-25 – 2018-01-29 (×5): 240 mg via ORAL
  Filled 2018-01-25 (×5): qty 1

## 2018-01-25 MED ORDER — NEPRO/CARBSTEADY PO LIQD
237.0000 mL | Freq: Two times a day (BID) | ORAL | Status: DC
  Administered 2018-01-26 – 2018-01-28 (×3): 237 mL via ORAL

## 2018-01-25 MED ORDER — METOPROLOL SUCCINATE ER 25 MG PO TB24
25.0000 mg | ORAL_TABLET | Freq: Every day | ORAL | Status: DC
  Administered 2018-01-25 – 2018-01-27 (×3): 25 mg via ORAL
  Filled 2018-01-25 (×3): qty 1

## 2018-01-25 MED ORDER — BETAMETHASONE VALERATE 0.1 % EX LOTN
1.0000 "application " | TOPICAL_LOTION | Freq: Every day | CUTANEOUS | Status: DC
  Filled 2018-01-25: qty 60

## 2018-01-25 MED ORDER — ZOLPIDEM TARTRATE 5 MG PO TABS: 5.0000 mg | ORAL_TABLET | Freq: Every evening | ORAL | Status: DC | PRN

## 2018-01-25 MED ORDER — DM-GUAIFENESIN ER 30-600 MG PO TB12: 1.0000 | ORAL_TABLET | Freq: Two times a day (BID) | ORAL | Status: DC | PRN

## 2018-01-25 MED ORDER — ALBUTEROL SULFATE (2.5 MG/3ML) 0.083% IN NEBU
2.5000 mg | INHALATION_SOLUTION | RESPIRATORY_TRACT | Status: DC | PRN
  Administered 2018-01-27: 2.5 mg via RESPIRATORY_TRACT
  Filled 2018-01-25: qty 3

## 2018-01-25 MED ORDER — HEPARIN SODIUM (PORCINE) 5000 UNIT/ML IJ SOLN
5000.0000 [IU] | Freq: Three times a day (TID) | INTRAMUSCULAR | Status: DC
  Administered 2018-01-25 – 2018-01-29 (×8): 5000 [IU] via SUBCUTANEOUS
  Filled 2018-01-25 (×8): qty 1

## 2018-01-25 MED ORDER — MORPHINE SULFATE (PF) 4 MG/ML IV SOLN
2.0000 mg | INTRAVENOUS | Status: DC | PRN
  Administered 2018-01-25: 2 mg via INTRAVENOUS
  Filled 2018-01-25: qty 1

## 2018-01-25 MED ORDER — TRIAMCINOLONE ACETONIDE 0.1 % EX CREA
TOPICAL_CREAM | Freq: Every day | CUTANEOUS | Status: DC
  Administered 2018-01-27 – 2018-01-29 (×2): via TOPICAL
  Filled 2018-01-25: qty 15

## 2018-01-25 MED ORDER — ACETAMINOPHEN 650 MG RE SUPP: 650.0000 mg | Freq: Four times a day (QID) | RECTAL | Status: DC | PRN

## 2018-01-25 MED ORDER — METHYLPREDNISOLONE SODIUM SUCC 125 MG IJ SOLR
60.0000 mg | Freq: Two times a day (BID) | INTRAMUSCULAR | Status: DC
  Administered 2018-01-25 – 2018-01-26 (×2): 60 mg via INTRAVENOUS
  Filled 2018-01-25 (×2): qty 2

## 2018-01-25 MED ORDER — IOPAMIDOL (ISOVUE-300) INJECTION 61%
INTRAVENOUS | Status: AC
  Filled 2018-01-25: qty 30

## 2018-01-25 MED ORDER — ACETAMINOPHEN 325 MG PO TABS: 650.0000 mg | ORAL_TABLET | Freq: Four times a day (QID) | ORAL | Status: DC | PRN

## 2018-01-25 MED ORDER — IPRATROPIUM-ALBUTEROL 0.5-2.5 (3) MG/3ML IN SOLN
3.0000 mL | RESPIRATORY_TRACT | Status: DC
  Administered 2018-01-26 (×2): 3 mL via RESPIRATORY_TRACT
  Filled 2018-01-25 (×2): qty 3

## 2018-01-25 MED ORDER — TRAMADOL HCL 50 MG PO TABS
50.0000 mg | ORAL_TABLET | Freq: Four times a day (QID) | ORAL | Status: DC | PRN
  Administered 2018-01-25 – 2018-01-28 (×2): 50 mg via ORAL
  Filled 2018-01-25 (×2): qty 1

## 2018-01-25 MED ORDER — AMMONIUM LACTATE 12 % EX CREA
TOPICAL_CREAM | Freq: Every day | CUTANEOUS | Status: DC
  Administered 2018-01-25 – 2018-01-29 (×3): via TOPICAL
  Filled 2018-01-25: qty 385

## 2018-01-25 MED ORDER — PANTOPRAZOLE SODIUM 40 MG PO TBEC
40.0000 mg | DELAYED_RELEASE_TABLET | Freq: Every day | ORAL | Status: DC
  Administered 2018-01-25 – 2018-01-29 (×5): 40 mg via ORAL
  Filled 2018-01-25 (×4): qty 1

## 2018-01-25 MED ORDER — HYDRALAZINE HCL 20 MG/ML IJ SOLN: 5.0000 mg | INTRAMUSCULAR | Status: DC | PRN

## 2018-01-25 MED ORDER — CALCIUM ACETATE 667 MG PO CAPS
2001.0000 mg | ORAL_CAPSULE | Freq: Three times a day (TID) | ORAL | Status: DC
  Administered 2018-01-26 – 2018-01-29 (×9): 2001 mg via ORAL
  Filled 2018-01-25 (×20): qty 3

## 2018-01-25 NOTE — ED Triage Notes (Signed)
Pt. Stated, Im having stomach pain like a knife going up me , Ive had diarrhea for 7 weeks.

## 2018-01-25 NOTE — ED Triage Notes (Signed)
Pt. Missed wed dialysis on Wed. And pt. Stated, Shane [name] not peed in a week.

## 2018-01-25 NOTE — H&P (Signed)
History and Physical    Shane [name] ZOX:[id number] DOB: [birth year] DOA: 01/25/2018  Referring MD/NP/PA:   PCP: [name], MD   Patient coming from:  The patient is coming from home.  At baseline, pt is independent for most of ADL.   Chief Complaint: diarrhea, abdominal pain, SOB HPI: Shane [name] is a 77 y.o. male with medical history significant of ESRD (MWF), hypertension, tobacco abuse, GERD, atrial fibrillation not on anticoagulants, amyloid nephropathy, who presents with diarrhea, abdominal pain and SOB  Patient states that he has been having diarrhea for more than 4 weeks. He has a 1.3 watery diarrhea each day average. Denies nausea or vomiting. He states that he has intermittent mild diffuse abdominal pain. No bloody stool. Patient states that he has mild dry cough and mild shortness of breath. No chest pain, fever or chills. No runny nose or sore throat. He missed dialysis on Wednesday and Feb 17, 2023 due to family member death.   ED Course: pt was found to have WBC 5.3, lipase 53, negative urinalysis, potassium 4.9, bicarbonate of 25, creatinine 13.56, BUN 64, temperature normal, no tachycardia, has tachypnea, oxygen desaturation to 82% on room air, which improved to 100% on 2 L nasal cannula oxygen. Chest x-ray showed cardiomegaly, no pulmonary edema or infiltration. CT abdomen/pelvis showed mild ascites and bilateral small pleural effusion, otherwise not impressive. Patient is placed on telemetry bed for observation.  Review of Systems:   General: no fevers, chills, no body weight gain, has poor appetite, has fatigue HEENT: no blurry vision, hearing changes or sore throat Respiratory: has dyspnea, coughing, wheezing CV: no chest pain, no palpitations GI: no nausea, vomiting, has abdominal pain, diarrhea, no constipation GU: no dysuria, burning on urination, increased urinary frequency, hematuria  Ext: no leg edema Neuro: no unilateral weakness, numbness, or tingling, no  vision change or hearing loss Skin: no rash, no skin tear. MSK: No muscle spasm, no deformity, no limitation of range of movement in spin Heme: No easy bruising.  Travel history: No recent long distant travel.  Allergy: No Known Allergies  Past Medical History:  Diagnosis Date  . 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 Arthritis    bursitis in hip  . Bilateral inguinal hernia (BIH)    right > left side  . 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 Reynolds Bowl  . Elevated troponin 04/12/2015  . Full dentures   . Hypertension   . Tobacco abuse   . Urinary tract infection due to Enterococcus 08/03/2015  . Varicocele     Past Surgical History:  Procedure Laterality Date  . AV FISTULA PLACEMENT, BRACHIOCEPHALIC  10/19/2010   LEFT  . AV FISTULA PLACEMENT, RADIOCEPHALIC  01/30/2011   Right w/ ligation of competing venous branch by Dr. Leonides Sake  . AV FISTULA PLACEMENT, RADIOCEPHALIC  09/13/2010   LEFT  . BASCILIC VEIN TRANSPOSITION Left 04/08/2015   Procedure: BASILIC VEIN TRANSPOSITION;  Surgeon: Pryor Ochoa, MD;  Location: [facility] OR;  Service: Vascular;  Laterality: Left;  . COLONOSCOPY    . INGUINAL HERNIA REPAIR Bilateral 11/25/2015   Procedure: LAPAROSCOPIC BILATERAL INGUINAL HERNIA REPAIR--EXCISION OF RIGHT HYDROCELE;  Surgeon: Karie Soda, MD;  Location: [facility] OR;  Service: General;  Laterality: Bilateral;  . INSERTION OF MESH Bilateral 11/25/2015   Procedure: INSERTION OF MESH;  Surgeon: Karie Soda, MD;  Location: [facility] OR;  Service: General;  Laterality: Bilateral;  . MULTIPLE TOOTH EXTRACTIONS      Social History:  reports that he has been smoking cigarettes.  He has a 50.00 pack-year smoking history. He has never used smokeless tobacco. He reports that he does not drink alcohol or use drugs.  Family History:  Family History  Problem Relation Age of Onset  . Asthma Mother   .  Hypertension Mother   . Alcohol abuse Father   . Hypertension Father   . Hypertension Sister   . Diabetes Sister   . Heart disease Sister   . Hypertension Brother   . Peripheral vascular disease Brother   . Hypertension Daughter   . Heart disease Daughter      Prior to Admission medications   Medication Sig Start Date End Date Taking? Authorizing Provider  albuterol-ipratropium (COMBIVENT) 18-103 MCG/ACT inhaler Inhale 2 puffs into the lungs every 6 (six) hours as needed for wheezing. 02/11/13   Genelle GatherGlenn, Kathryn F, MD  ammonium lactate (AMLACTIN) 12 % cream Apply 1 application topically daily. 03/20/17   [provider]  aspirin EC 81 MG tablet Take 81 mg by mouth daily.    [provider]  AURYXIA 1 GM 210 MG(Fe) tablet Take 420 mg by mouth 3 (three) times daily. 01/08/18   [provider]  betamethasone valerate (VALISONE) 0.1 % cream Apply 1 application topically daily. 03/22/17   [provider]  calcium acetate (PHOSLO) 667 MG capsule Take 2,001 mg by mouth 3 (three) times daily with meals.     [provider]  diltiazem (CARDIZEM CD) 240 MG 24 hr capsule Take 240 mg by mouth daily.    [provider]  esomeprazole (NEXIUM) 20 MG capsule Take 20 mg by mouth daily before breakfast. 01/07/18   [provider]  metoprolol succinate (TOPROL-XL) 25 MG 24 hr tablet Take 1 tablet (25 mg total) by mouth daily. 04/23/17   Shaune PollackIsaacs, Cameron, MD  Nutritional Supplements (FEEDING SUPPLEMENT, NEPRO CARB STEADY,) LIQD Take 237 mLs by mouth 2 (two) times daily between meals. 12/01/15   Ghimire, Werner LeanShanker M, MD  traMADol (ULTRAM) 50 MG tablet Take 1-2 tablets (50-100 mg total) by mouth every 6 (six) hours as needed for moderate pain or severe pain. 11/25/15   Karie SodaGross, Steven, MD    Physical Exam: Vitals:   01/25/18 1445 01/25/18 1500 01/25/18 1530 01/25/18 1800  BP: (!) 146/99 (!) 150/98 (!) 165/92 (!) 158/93  Pulse: 77     Resp:      Temp:        TempSrc:      SpO2: 100%     Weight:      Height:       General: Not in acute distress HEENT:       Eyes: PERRL, EOMI, no scleral icterus.       ENT: No discharge from the ears and nose, no pharynx injection, no tonsillar enlargement.        Neck: No JVD, no bruit, no mass felt. Heme: No neck lymph node enlargement. Cardiac: S1/S2, RRR, No murmurs, No gallops or rubs. Respiratory: has wheezing bilaterally. GI: distended, mildly tender diffusely, no rebound pain, no organomegaly, BS present. GU: No hematuria Ext: No pitting leg edema bilaterally. 2+DP/PT pulse bilaterally. Musculoskeletal: No joint deformities, No joint redness or warmth, no limitation of ROM in spin. Skin: No rashes.  Neuro: Alert, oriented X3, cranial nerves II-XII grossly intact, moves all extremities normally.  Psych: Patient is not psychotic, no suicidal or  hemocidal ideation.  Labs on Admission: I have personally reviewed following labs and imaging studies  CBC: Recent Labs  Lab 01/25/18 0953  WBC 5.3  HGB 10.9*  HCT 34.5*  MCV 102.4*  PLT 104*   Basic Metabolic Panel: Recent Labs  Lab 01/25/18 0953  NA 144  K 4.9  CL 100*  CO2 25  GLUCOSE 75  BUN 64*  CREATININE 13.56*  CALCIUM 9.0   GFR: Estimated Creatinine Clearance: 4.5 mL/min (A) (by C-G formula based on SCr of 13.56 mg/dL (H)). Liver Function Tests: Recent Labs  Lab 01/25/18 0953  AST 15  ALT 8*  ALKPHOS 74  BILITOT 1.5*  PROT 7.1  ALBUMIN 3.7   Recent Labs  Lab 01/25/18 0953  LIPASE 53*   No results for input(s): AMMONIA in the last 168 hours. Coagulation Profile: No results for input(s): INR, PROTIME in the last 168 hours. Cardiac Enzymes: No results for input(s): CKTOTAL, CKMB, CKMBINDEX, TROPONINI in the last 168 hours. BNP (last 3 results) No results for input(s): PROBNP in the last 8760 hours. HbA1C: No results for input(s): HGBA1C in the last 72 hours. CBG: No results for input(s): GLUCAP in the last 168  hours. Lipid Profile: No results for input(s): CHOL, HDL, LDLCALC, TRIG, CHOLHDL, LDLDIRECT in the last 72 hours. Thyroid Function Tests: No results for input(s): TSH, T4TOTAL, FREET4, T3FREE, THYROIDAB in the last 72 hours. Anemia Panel: No results for input(s): VITAMINB12, FOLATE, FERRITIN, TIBC, IRON, RETICCTPCT in the last 72 hours. Urine analysis:    Component Value Date/Time   COLORURINE AMBER (A) 01/25/2018 1540   APPEARANCEUR CLEAR 01/25/2018 1540   LABSPEC 1.014 01/25/2018 1540   PHURINE 7.0 01/25/2018 1540   GLUCOSEU 50 (A) 01/25/2018 1540   HGBUR NEGATIVE 01/25/2018 1540   BILIRUBINUR NEGATIVE 01/25/2018 1540   KETONESUR NEGATIVE 01/25/2018 1540   PROTEINUR 100 (A) 01/25/2018 1540   UROBILINOGEN 0.2 08/01/2015 1430   NITRITE NEGATIVE 01/25/2018 1540   LEUKOCYTESUR NEGATIVE 01/25/2018 1540   Sepsis Labs: @LABRCNTIP (procalcitonin:4,lacticidven:4) )No results found for this or any previous visit (from the past 240 hour(s)).   Radiological Exams on Admission: Ct Abdomen Pelvis Wo Contrast  Result Date: 01/25/2018 CLINICAL DATA:  Abdominal pain and diarrhea for several weeks EXAM: CT ABDOMEN AND PELVIS WITHOUT CONTRAST TECHNIQUE: Multidetector CT imaging of the abdomen and pelvis was performed following the standard protocol without IV contrast. COMPARISON:  None. FINDINGS: Lower chest: Small bilateral pleural effusions are noted with mild bibasilar atelectasis. This is new from the prior exam. Hepatobiliary: The liver and gallbladder appear within normal limits. Pancreas: Unremarkable. No pancreatic ductal dilatation or surrounding inflammatory changes. Spleen: Normal in size without focal abnormality. Adrenals/Urinary Tract: Adrenal glands are unremarkable. The kidneys are small and shrunken consistent with end-stage renal disease. The bladder is decompressed by Foley catheter. Stomach/Bowel: No obstructive or inflammatory changes of the bowel are seen. The appendix appears  within normal limits. Vascular/Lymphatic: Aortic atherosclerosis. No enlarged abdominal or pelvic lymph nodes. Reproductive: Prostate is unremarkable. Other: Mild diffuse ascites is identified. No large sizable pocket to allow for safe paracentesis is seen. Musculoskeletal: Degenerative changes of the lumbar spine are seen. No acute bony abnormality is noted. IMPRESSION: Small bilateral pleural effusions and bibasilar atelectasis. Mild ascites. Changes consistent with chronic renal disease. Electronically Signed   By: Alcide Clever M.D.   On: 01/25/2018 18:56   Dg Chest 2 View  Result Date: 01/25/2018 CLINICAL DATA:  Shortness of breath and hypoxemia EXAM: CHEST - 2 VIEW  COMPARISON:  Chest radiograph 10/12/2017 FINDINGS: Cardiomegaly with calcific aortic atherosclerosis, unchanged. No pulmonary edema or focal airspace consolidation. No pneumothorax or sizable pleural effusion. Basilar opacities, likely atelectasis, unchanged. IMPRESSION: Cardiomegaly and calcific aortic atherosclerosis (ICD10-I70.0). Unchanged basilar opacities, likely atelectasis. No pulmonary edema. Electronically Signed   By: Deatra Robinson M.D.   On: 01/25/2018 15:43     EKG:  Not done in ED, will get one.   Assessment/Plan Principal Problem:   Diarrhea Active Problems:   Tobacco abuse   ESRD on dialysis [facility])   Essential hypertension   GERD (gastroesophageal reflux disease)   Abdominal pain   Acute respiratory failure with hypoxia (HCC)   Atrial fibrillation, chronic (HCC)   Thrombocytopenia (HCC)   Diarrhea and mild abdominal pain: etiology is not clear. Has been going on for more than 7 weeks. Lipase normal. CT abdomen/pelvis is not impressive except for mild ascites. Patient does not have fever or leukocytosis. Clinically nonseptic. Hemodynamically stable. -place on tele bed for obs -check GI path panel and C diff pcr -prn zofran for nausea -continue home prn tramadol  Acute respiratory failure with hypoxia:   Oxygen desaturation to 88% on room air. Patient has wheezing bilaterally. Given history of smoking, patient may have undiagnosed COPD. Patient may also have mild fluid overload due to missing 2 sessions of dialysis, but chest x-ray did showed pulmonary edema. -Nebulizers: scheduled Duoneb and prn albuterol -Solu-Medrol 60 mg IV bid -Mucinex for cough  -Nasal cannula oxygen as needed to maintain O2 saturation 92% or great  Tobacco abuse: -Did counseling about importance of quitting smoking -Nicotine patch  ESRD on dialysis (MWF): he missed 2 sessions of HD. potassium 4.9, bicarbonate of 25, creatinine 13.56, BUN 64 -Dr. Signe Colt of renal was consulted by EDP  HTN:  -Continue home medications:Cardizem, metoprolol -IV hydralazine prn  GERD: -Protonix  Thrombocytopenia: This seems to be a chronic issue. Platelet was 101 on 10/12/17-->104 today. Etiology is not clear. No bleeding tendency. -Follow-up by CBC  Atrial Fibrillation: CHA2DS2-VASc Score is 2, needs oral anticoagulation, but patient is not on AC. Marland Kitchen Not sure why patient is not taking anticoagulant. Patient has thrombocytopenia, which might be the reason. Heart rate is well controlled. -continue Cardizem and metoprolol   DVT ppx: SQ Heparin    Code Status: Full code Family Communication: None at bed side.    Disposition Plan:  Anticipate discharge back to previous home environment Consults called:  Renal, Dr. Signe Colt Admission status: Obs / tele    Date of Service 01/25/2018    Lorretta Harp [facility] Pager [phone]  If 7PM-7AM, please contact night-coverage [email or web address] Password [facility] 01/25/2018, 8:36 PM

## 2018-01-25 NOTE — ED Provider Notes (Signed)
Emergency Department Provider Note   I have reviewed the triage vital signs and the nursing notes.   HISTORY  Chief Complaint Diarrhea; Abdominal Pain; and Vascular Access Problem   HPI Shane [name] is a 77 y.o. male with PMH of ESRD on MWF HD, HTN, tobacco abuse, and PAF resents to the emergency department for evaluation of shortness of breath, abdominal pain, diarrhea.  Diarrhea symptoms have been ongoing for the past 7 weeks but patient states his stools have become more frequent and watery.  He has also noticed some increasing shortness of breath starting today.  He missed dialysis on 2023/03/06 because of a death in the family.  He was scheduled to go today but this morning he was having a bowel movement and when he stood up he had some watery, nonbloody, stool incontinence.  He has had some diffuse abdominal discomfort.  His last urine output was 1 week prior which is not necessarily unusual for him.  He denies any fevers or chills.  Denies any active chest pain with his dyspnea.  He continues to smoke about three quarters of a pack of cigarettes per day and has been smoking for over 50 years.  He is not on home oxygen and has not tried Albuterol at home PTA. No modifying factors. No recent abx.    Past Medical History:  Diagnosis Date  . 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 Arthritis    bursitis in hip  . Bilateral inguinal hernia (BIH)    right > left side  . 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 Reynolds Bowl  . Elevated troponin 04/12/2015  . Full dentures   . Hypertension   . Tobacco abuse   . Urinary tract infection due to Enterococcus 08/03/2015  . Varicocele     Patient Active Problem List   Diagnosis Date Noted  . GERD (gastroesophageal reflux disease) 01/25/2018  . Diarrhea 01/25/2018  . Abdominal pain 01/25/2018  . Acute respiratory failure with hypoxia (HCC)  01/25/2018  . Thrombocytopenia (HCC) 01/25/2018  . Atrial fibrillation, chronic (HCC)   . Atrial fibrillation (HCC) 06/01/2017  . A-fib (HCC) 06/01/2017  . Atrial fibrillation with RVR (HCC) 06/29/2016  . Adjustment disorder with other symptom   . Toxic metabolic encephalopathy 12/01/2015  . Bilateral inguinal hernia (BIH) s/p lap repair w mesh 11/25/2015 11/25/2015  . Hydrocele s/p partial rexction/unroofing 11/25/2015 11/25/2015  . 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 Pulmonary hypertension (HCC) 08/04/2015  . Essential hypertension   . Acute encephalopathy 04/12/2015  . ESRD on dialysis (HCC) 04/12/2015  . Anorexia 04/12/2015  . Amyloidosis (HCC) 04/12/2015  . Uremia 04/12/2015  . Tobacco abuse 02/09/2013  . Hypertension 02/09/2013    Past Surgical History:  Procedure Laterality Date  . AV FISTULA PLACEMENT, BRACHIOCEPHALIC  10/19/2010   LEFT  . AV FISTULA PLACEMENT, RADIOCEPHALIC  01/30/2011   Right w/ ligation of competing venous branch by Dr. Leonides Sake  . AV FISTULA PLACEMENT, RADIOCEPHALIC  09/13/2010   LEFT  . BASCILIC VEIN TRANSPOSITION Left 04/08/2015   Procedure: BASILIC VEIN TRANSPOSITION;  Surgeon: Pryor Ochoa, MD;  [facility] OR;  Service: Vascular;  Laterality: Left;  . COLONOSCOPY    . INGUINAL HERNIA REPAIR Bilateral 11/25/2015   Procedure: LAPAROSCOPIC BILATERAL INGUINAL HERNIA REPAIR--EXCISION OF RIGHT HYDROCELE;  Surgeon: Karie Soda, MD;  Location: [facility] OR;  Service: General;  Laterality: Bilateral;  . INSERTION OF MESH Bilateral 11/25/2015   Procedure: INSERTION OF MESH;  Surgeon: Karie SodaSteven Gross, MD;  [facility];  Service: General;  Laterality: Bilateral;  . MULTIPLE TOOTH EXTRACTIONS        Allergies Patient has no known allergies.  Family History  Problem Relation Age of Onset  . Asthma Mother   . Hypertension Mother   . Alcohol abuse Father   . Hypertension Father   . Hypertension Sister   . Diabetes  Sister   . Heart disease Sister   . Hypertension Brother   . Peripheral vascular disease Brother   . Hypertension Daughter   . Heart disease Daughter     Social History Social History   Tobacco Use  . Smoking status: Current Every Day Smoker    Packs/day: 1.00    Years: 50.00    Pack years: 50.00    Types: Cigarettes  . Smokeless tobacco: Never Used  Substance Use Topics  . Alcohol use: No    Alcohol/week: 0.0 oz  . Drug use: No    Review of Systems  Constitutional: No fever/chills Eyes: No visual changes. ENT: No sore throat. Cardiovascular: Denies chest pain. Respiratory: Positive shortness of breath. Gastrointestinal: Positive abdominal pain.  No nausea, no vomiting. Positive diarrhea.  No constipation. Genitourinary: Negative for dysuria. Musculoskeletal: Negative for back pain. Skin: Negative for rash. Neurological: Negative for headaches, focal weakness or numbness.  10-point ROS otherwise negative.  ____________________________________________   PHYSICAL EXAM:  VITAL SIGNS: ED Triage Vitals  Enc Vitals Group     BP 01/25/18 0916 (!) 170/105     Pulse Rate 01/25/18 0916 79     Resp 01/25/18 0916 18     Temp 01/25/18 0916 97.7 F (36.5 C)     Temp Source 01/25/18 0916 Oral     SpO2 01/25/18 0916 100 %     Weight 01/25/18 0925 149 lb 14.6 oz (68 kg)     Height 01/25/18 0925 5\' 11"  (1.803 m)     Pain Score 01/25/18 0924 0   Constitutional: Alert and oriented. Well appearing and in no acute distress. Eyes: Conjunctivae are normal. Head: Atraumatic. Nose: No congestion/rhinnorhea. Mouth/Throat: Mucous membranes are dry.  Neck: No stridor.   Cardiovascular: Normal rate, regular rhythm. Good peripheral circulation. Grossly normal heart sounds.   Respiratory: Normal respiratory effort.  No retractions. Lungs CTAB. Gastrointestinal: Soft with mild diffuse tenderness and some distension.  Musculoskeletal: No lower extremity tenderness nor edema. No gross  deformities of extremities. Neurologic:  Normal speech and language. No gross focal neurologic deficits are appreciated.  Skin:  Skin is warm, dry and intact. No rash noted. ____________________________________________   LABS (all labs ordered are listed, but only abnormal results are displayed)  Labs Reviewed  LIPASE, BLOOD - Abnormal; Notable for the following components:      Result Value   Lipase 53 (*)    All other components within normal limits  COMPREHENSIVE METABOLIC PANEL - Abnormal; Notable for the following components:   Chloride 100 (*)    BUN 64 (*)    Creatinine, Ser 13.56 (*)    ALT 8 (*)    Total Bilirubin 1.5 (*)    GFR calc non Af Amer 3 (*)    GFR calc Af Amer 4 (*)    Anion gap 19 (*)    All other components within normal limits  CBC - Abnormal; Notable for the following components:   RBC  3.37 (*)    Hemoglobin 10.9 (*)    HCT 34.5 (*)    MCV 102.4 (*)    Platelets 104 (*)    All other components within normal limits  URINALYSIS, ROUTINE W REFLEX MICROSCOPIC - Abnormal; Notable for the following components:   Color, Urine AMBER (*)    Glucose, UA 50 (*)    Protein, ur 100 (*)    Squamous Epithelial / LPF 0-5 (*)    All other components within normal limits  MRSA PCR SCREENING  GASTROINTESTINAL PANEL BY PCR, STOOL (REPLACES STOOL CULTURE)  C DIFFICILE QUICK SCREEN W PCR REFLEX  BASIC METABOLIC PANEL  CBC   ____________________________________________  EKG  Sinus rhythm. Bifascicular block. Normal T waves. No ST elevation or depression. No STEMI.   ____________________________________________  RADIOLOGY  Ct Abdomen Pelvis Wo Contrast  Result Date: 01/25/2018 CLINICAL DATA:  Abdominal pain and diarrhea for several weeks EXAM: CT ABDOMEN AND PELVIS WITHOUT CONTRAST TECHNIQUE: Multidetector CT imaging of the abdomen and pelvis was performed following the standard protocol without IV contrast. COMPARISON:  None. FINDINGS: Lower chest: Small  bilateral pleural effusions are noted with mild bibasilar atelectasis. This is new from the prior exam. Hepatobiliary: The liver and gallbladder appear within normal limits. Pancreas: Unremarkable. No pancreatic ductal dilatation or surrounding inflammatory changes. Spleen: Normal in size without focal abnormality. Adrenals/Urinary Tract: Adrenal glands are unremarkable. The kidneys are small and shrunken consistent with end-stage renal disease. The bladder is decompressed by Foley catheter. Stomach/Bowel: No obstructive or inflammatory changes of the bowel are seen. The appendix appears within normal limits. Vascular/Lymphatic: Aortic atherosclerosis. No enlarged abdominal or pelvic lymph nodes. Reproductive: Prostate is unremarkable. Other: Mild diffuse ascites is identified. No large sizable pocket to allow for safe paracentesis is seen. Musculoskeletal: Degenerative changes of the lumbar spine are seen. No acute bony abnormality is noted. IMPRESSION: Small bilateral pleural effusions and bibasilar atelectasis. Mild ascites. Changes consistent with chronic renal disease. Electronically Signed   By: Alcide Clever M.D.   On: 01/25/2018 18:56   Dg Chest 2 View  Result Date: 01/25/2018 CLINICAL DATA:  Shortness of breath and hypoxemia EXAM: CHEST - 2 VIEW COMPARISON:  Chest radiograph 10/12/2017 FINDINGS: Cardiomegaly with calcific aortic atherosclerosis, unchanged. No pulmonary edema or focal airspace consolidation. No pneumothorax or sizable pleural effusion. Basilar opacities, likely atelectasis, unchanged. IMPRESSION: Cardiomegaly and calcific aortic atherosclerosis (ICD10-I70.0). Unchanged basilar opacities, likely atelectasis. No pulmonary edema. Electronically Signed   By: Deatra Robinson M.D.   On: 01/25/2018 15:43    ____________________________________________   PROCEDURES  Procedure(s) performed:   Procedures  None ____________________________________________   INITIAL IMPRESSION /  ASSESSMENT AND PLAN / ED COURSE  Pertinent labs & imaging results that were available during my care of the patient were reviewed by me and considered in my medical decision making (see chart for details).  Patient presents to the emergency department for evaluation of abdominal pain with diarrhea and dyspnea.  Patient's dyspnea seems to be worsening over the past 24 hours where as the diarrhea has been ongoing for 6-7 weeks.  He does have some mild to moderate abdominal distention with diffuse discomfort.  He does have a new oxygen requirement although does have a long smoking history.  Plan for DuoNeb's along with chest x-ray and CT imaging of the abdomen and pelvis without contrast to r/o focal infectious process.   07:32 PM CT imaging reviewed. Patient with mild ascites and bilateral pleural effusions. Plan for admission for HD in  the AM and continued O2 overnight. Feeling slightly better after Duoneb so SOB and hypoxemia likely a mixed picture. Very low suspicion for SBP. Unable to safely attempt diagnostic paracentesis at bedside due to small volume ascites. Labs reviewed without acute findings. Spoke with nephrology, Dr. Signe Colt, who will plan for HD in the AM. Will discuss admission with hospitalist team.   Discussed patient's case with Hospitalist, Dr. Clyde Lundborg to request admission. Patient and family (if present) updated with plan. Care transferred to Hospitalist service.  I reviewed all nursing notes, vitals, pertinent old records, EKGs, labs, imaging (as available).  ____________________________________________  FINAL CLINICAL IMPRESSION(S) / ED DIAGNOSES  Final diagnoses:  Generalized abdominal pain  Diarrhea, unspecified type  SOB (shortness of breath)  Hypoxemia     MEDICATIONS GIVEN DURING THIS VISIT:  Medications  nicotine (NICODERM CQ - dosed in mg/24 hours) patch 21 mg (21 mg Transdermal Patch Applied 01/25/18 1441)  iopamidol (ISOVUE-300) 61 % injection (has no  administration in time range)  ammonium lactate (AMLACTIN) 12 % cream ( Topical Given 01/25/18 2318)  aspirin EC tablet 81 mg (81 mg Oral Given 01/25/18 2315)  ferric citrate (AURYXIA) tablet 420 mg (420 mg Oral Not Given 01/25/18 2322)  calcium acetate (PHOSLO) capsule 2,001 mg (has no administration in time range)  diltiazem (CARDIZEM CD) 24 hr capsule 240 mg (240 mg Oral Given 01/25/18 2315)  pantoprazole (PROTONIX) EC tablet 40 mg (40 mg Oral Given 01/25/18 2322)  metoprolol succinate (TOPROL-XL) 24 hr tablet 25 mg (25 mg Oral Given 01/25/18 2315)  feeding supplement (NEPRO CARB STEADY) liquid 237 mL (has no administration in time range)  traMADol (ULTRAM) tablet 50-100 mg (50 mg Oral Given 01/25/18 2315)  albuterol (PROVENTIL) (2.5 MG/3ML) 0.083% nebulizer solution 2.5 mg (has no administration in time range)  methylPREDNISolone sodium succinate (SOLU-MEDROL) 125 mg/2 mL injection 60 mg (60 mg Intravenous Given 01/25/18 2314)  dextromethorphan-guaiFENesin (MUCINEX DM) 30-600 MG per 12 hr tablet 1 tablet (has no administration in time range)  heparin injection 5,000 Units (5,000 Units Subcutaneous Not Given 01/26/18 0700)  acetaminophen (TYLENOL) tablet 650 mg (has no administration in time range)    Or  acetaminophen (TYLENOL) suppository 650 mg (has no administration in time range)  ondansetron (ZOFRAN) tablet 4 mg (has no administration in time range)    Or  ondansetron (ZOFRAN) injection 4 mg (has no administration in time range)  hydrALAZINE (APRESOLINE) injection 5 mg (has no administration in time range)  zolpidem (AMBIEN) tablet 5 mg (has no administration in time range)  triamcinolone cream (KENALOG) 0.1 % (has no administration in time range)  ipratropium-albuterol (DUONEB) 0.5-2.5 (3) MG/3ML nebulizer solution 3 mL (has no administration in time range)  ipratropium-albuterol (DUONEB) 0.5-2.5 (3) MG/3ML nebulizer solution 3 mL (3 mLs Nebulization Given 01/25/18 1441)    Note:  This  document was prepared using Dragon voice recognition software and may include unintentional dictation errors.  Alona Bene, MD Emergency Medicine   Long, Arlyss Repress, MD 01/26/18 [phone]

## 2018-01-25 NOTE — ED Notes (Signed)
Placed Pt on 2L O2 

## 2018-01-26 ENCOUNTER — Encounter (HOSPITAL_COMMUNITY): Payer: Self-pay | Admitting: Nephrology

## 2018-01-26 DIAGNOSIS — K529 Noninfective gastroenteritis and colitis, unspecified: Secondary | ICD-10-CM | POA: Diagnosis not present

## 2018-01-26 DIAGNOSIS — J9601 Acute respiratory failure with hypoxia: Secondary | ICD-10-CM | POA: Diagnosis not present

## 2018-01-26 DIAGNOSIS — N186 End stage renal disease: Secondary | ICD-10-CM | POA: Diagnosis not present

## 2018-01-26 DIAGNOSIS — R197 Diarrhea, unspecified: Secondary | ICD-10-CM

## 2018-01-26 DIAGNOSIS — I120 Hypertensive chronic kidney disease with stage 5 chronic kidney disease or end stage renal disease: Secondary | ICD-10-CM | POA: Diagnosis not present

## 2018-01-26 LAB — BASIC METABOLIC PANEL
Anion gap: 20 — ABNORMAL HIGH (ref 5–15)
BUN: 68 mg/dL — AB (ref 6–20)
CO2: 20 mmol/L — AB (ref 22–32)
CREATININE: 13.89 mg/dL — AB (ref 0.61–1.24)
Calcium: 8.6 mg/dL — ABNORMAL LOW (ref 8.9–10.3)
Chloride: 100 mmol/L — ABNORMAL LOW (ref 101–111)
GFR calc Af Amer: 3 mL/min — ABNORMAL LOW (ref >60)
GFR calc non Af Amer: 3 mL/min — ABNORMAL LOW (ref >60)
GLUCOSE: 113 mg/dL — AB (ref 65–99)
POTASSIUM: 6.3 mmol/L — AB (ref 3.5–5.1)
SODIUM: 140 mmol/L (ref 135–145)

## 2018-01-26 LAB — CBC
HEMATOCRIT: 36.1 % — AB (ref 39.0–52.0)
Hemoglobin: 11.4 g/dL — ABNORMAL LOW (ref 13.0–17.0)
MCH: 32 pg (ref 26.0–34.0)
MCHC: 31.6 g/dL (ref 30.0–36.0)
MCV: 101.4 fL — AB (ref 78.0–100.0)
PLATELETS: 102 10*3/uL — AB (ref 150–400)
RBC: 3.56 MIL/uL — ABNORMAL LOW (ref 4.22–5.81)
RDW: 14.8 % (ref 11.5–15.5)
WBC: 7.1 10*3/uL (ref 4.0–10.5)

## 2018-01-26 LAB — MRSA PCR SCREENING: MRSA by PCR: NEGATIVE

## 2018-01-26 LAB — GLUCOSE, CAPILLARY: Glucose-Capillary: 103 mg/dL — ABNORMAL HIGH (ref 65–99)

## 2018-01-26 MED ORDER — CALCITRIOL 0.25 MCG PO CAPS
1.0000 ug | ORAL_CAPSULE | ORAL | Status: DC
  Administered 2018-01-28: 1 ug via ORAL

## 2018-01-26 MED ORDER — NICOTINE 21 MG/24HR TD PT24
21.0000 mg | MEDICATED_PATCH | Freq: Every day | TRANSDERMAL | Status: DC
  Administered 2018-01-26 – 2018-01-29 (×4): 21 mg via TRANSDERMAL
  Filled 2018-01-26 (×4): qty 1

## 2018-01-26 MED ORDER — METHYLPREDNISOLONE SODIUM SUCC 40 MG IJ SOLR
40.0000 mg | Freq: Every day | INTRAMUSCULAR | Status: DC
  Administered 2018-01-27: 40 mg via INTRAVENOUS
  Filled 2018-01-26: qty 1

## 2018-01-26 MED ORDER — IPRATROPIUM-ALBUTEROL 0.5-2.5 (3) MG/3ML IN SOLN
3.0000 mL | Freq: Four times a day (QID) | RESPIRATORY_TRACT | Status: DC
  Administered 2018-01-26: 3 mL via RESPIRATORY_TRACT
  Filled 2018-01-26 (×3): qty 3

## 2018-01-26 NOTE — Progress Notes (Signed)
PROGRESS NOTE    Shane [name]  ZOX:[id number] DOB: 10/11/1940 DOA: 01/25/2018 PCP: [name], MD   Brief Narrative: 77 y.o. male with medical history significant of ESRD (MWF), hypertension, tobacco abuse, GERD, atrial fibrillation not on anticoagulants, amyloid nephropathy, who presents with diarrhea, abdominal pain and SOB  Patient states that he has been having diarrhea for more than 4 weeks. He has a 1.3 watery diarrhea each day average. Denies nausea or vomiting. He states that he has intermittent mild diffuse abdominal pain. No bloody stool. Patient states that he has mild dry cough and mild shortness of breath. No chest pain, fever or chills. No runny nose or sore throat. He missed dialysis on Wednesday and 03/04/2023 due to family member death.   Patient lives at home with his wife and son.  ED Course: pt was found to have WBC 5.3, lipase 53, negative urinalysis, potassium 4.9, bicarbonate of 25, creatinine 13.56, BUN 64, temperature normal, no tachycardia, has tachypnea, oxygen desaturation to 82% on room air, which improved to 100% on 2 L nasal cannula oxygen. Chest x-ray showed cardiomegaly, no pulmonary edema or infiltration. CT abdomen/pelvis showed mild ascites and bilateral small pleural effusion, otherwise not impressive. Patient is placed on telemetry bed for observation.     Assessment & Plan:   Principal Problem:   Diarrhea Active Problems:   Tobacco abuse   ESRD on dialysis [facility])   Essential hypertension   GERD (gastroesophageal reflux disease)   Abdominal pain   Acute respiratory failure with hypoxia (HCC)   Atrial fibrillation, chronic (HCC)   Thrombocytopenia (HCC)  Diarrhea and mild abdominal pain: etiology is not clear. Has been going on for more than 7 weeks. Lipase normal. CT abdomen/pelvis is not impressive except for mild ascites. Patient does not have fever or leukocytosis. Clinically nonseptic. Hemodynamically stable.  Patient denies having any  diarrhea today.  I do not see a GI panel or C. difficile sent.  However he has no more episodes of diarrhea.  Acute respiratory failure with hypoxia:  Oxygen desaturation to 88% on room air. Patient has wheezing bilaterally. Given history of smoking, patient may have undiagnosed COPD. Patient may also have mild fluid overload due to missing 2 sessions of dialysis, but chest x-ray did showed pulmonary edema.  Patient to undergo dialysis today to make for the last 2 sessions that he missed.  Taper steroids.  Patient was on room air this morning saturation 97%.    Tobacco abuse: -Did counseling about importance of quitting smoking -Nicotine patch ESRD on dialysis (MWF): he missed 2 sessions of HD. potassium 4.9, bicarbonate of 25, creatinine 13.56, BUN 64 -Dr. 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    Atrial Fibrillation: CHA2DS2-VASc Score is 2, needs oral anticoagulation, but patient is not on AC. Marland Kitchen Not sure why patient is not taking anticoagulant. Patient has thrombocytopenia, which might be the reason. Heart rate is well controlled. -continue Cardizem and metoprolol       DVT prophylaxis: Patient on heparin will DC heparin and place him on SCD due to thrombocytopenia. Code Status: Full code Family Communication: No family available  Disposition Plan:  To be determined  Consultants: Nephrology  Procedures: None Antimicrobials: None  Subjective: He denies shortness of breath or diarrhea or abdominal pain at this time.   Objective: Resting in bed in  no acute distress off of oxygen however I can hear the wheezing while he is talking to me.  He appears to be very jovial and happy and keeps saying that  you must think I am crazy. Vitals:   01/25/18 2205 01/26/18 0339 01/26/18 0650 01/26/18 0722  BP: (!)  141/112  109/73   Pulse: 71  64   Resp: 18  16   Temp: (!) 97.4 F (36.3 C)     TempSrc: Oral     SpO2: 94% 93% 100% 97%  Weight:      Height:        Intake/Output Summary (Last 24 hours) at 01/26/2018 1131 Last data filed at 01/26/2018 0250 Gross per 24 hour  Intake -  Output 400 ml  Net -400 ml   Filed Weights   01/25/18 0925  Weight: 68 kg (149 lb 14.6 oz)    Examination:  General exam: Appears calm and comfortable  Respiratory system: Wheezing to auscultation. Respiratory effort normal. Cardiovascular system: S1 & S2 heard, RRR. No JVD, murmurs, rubs, gallops or clicks. No pedal edema. Gastrointestinal system: Abdomen is nondistended, soft and nontender. No organomegaly or masses felt. Normal bowel sounds heard. Central nervous system: Alert and oriented. No focal neurological deficits. Extremities: Symmetric 5 x 5 power. Skin: No rashes, lesions or ulcers Psychiatry: Judgement and insight appear normal. Mood & affect appropriate.     Data Reviewed: I have personally reviewed following labs and imaging studies  CBC: Recent Labs  Lab 01/25/18 0953 01/26/18 0714  WBC 5.3 7.1  HGB 10.9* 11.4*  HCT 34.5* 36.1*  MCV 102.4* 101.4*  PLT 104* 102*   Basic Metabolic Panel: Recent Labs  Lab 01/25/18 0953 01/26/18 0714  NA 144 140  K 4.9 6.3*  CL 100* 100*  CO2 25 20*  GLUCOSE 75 113*  BUN 64* 68*  CREATININE 13.56* 13.89*  CALCIUM 9.0 8.6*   GFR: Estimated Creatinine Clearance: 4.4 mL/min (A) (by C-G formula based on SCr of 13.89 mg/dL (H)). Liver Function Tests: Recent Labs  Lab 01/25/18 0953  AST 15  ALT 8*  ALKPHOS 74  BILITOT 1.5*  PROT 7.1  ALBUMIN 3.7   Recent Labs  Lab 01/25/18 0953  LIPASE 53*   No results for input(s): AMMONIA in the last 168 hours. Coagulation Profile: No results for input(s): INR, PROTIME in the last 168 hours. Cardiac Enzymes: No results for input(s): CKTOTAL, CKMB, CKMBINDEX, TROPONINI in the last 168  hours. BNP (last 3 results) No results for input(s): PROBNP in the last 8760 hours. HbA1C: No results for input(s): HGBA1C in the last 72 hours. CBG: No results for input(s): GLUCAP in the last 168 hours. Lipid Profile: No results for input(s): CHOL, HDL, LDLCALC, TRIG, CHOLHDL, LDLDIRECT in the last 72 hours. Thyroid Function Tests: No results for input(s): TSH, T4TOTAL, FREET4, T3FREE, THYROIDAB in the last 72 hours. Anemia Panel: No results for input(s): VITAMINB12, FOLATE, FERRITIN, TIBC, IRON, RETICCTPCT in the last 72 hours. Sepsis Labs: No results for input(s): PROCALCITON, LATICACIDVEN in the last 168 hours.  Recent Results (from the past 240 hour(s))  MRSA PCR Screening     Status: None   Collection Time: 01/25/18 10:13 PM  Result Value Ref Range Status   MRSA by PCR NEGATIVE NEGATIVE Final    Comment:        The GeneXpert MRSA Assay (FDA approved for NASAL specimens only), is one component of a comprehensive MRSA colonization surveillance program. It is not intended to  diagnose MRSA infection nor to guide or monitor treatment for MRSA infections. Performed at [facility], [address] [place], [place] [address]          Radiology Studies: Ct Abdomen Pelvis Wo Contrast  Result Date: 01/25/2018 CLINICAL DATA:  Abdominal pain and diarrhea for several weeks EXAM: CT ABDOMEN AND PELVIS WITHOUT CONTRAST TECHNIQUE: Multidetector CT imaging of the abdomen and pelvis was performed following the standard protocol without IV contrast. COMPARISON:  None. FINDINGS: Lower chest: Small bilateral pleural effusions are noted with mild bibasilar atelectasis. This is new from the prior exam. Hepatobiliary: The liver and gallbladder appear within normal limits. Pancreas: Unremarkable. No pancreatic ductal dilatation or surrounding inflammatory changes. Spleen: Normal in size without focal abnormality. Adrenals/Urinary Tract: Adrenal glands are unremarkable. The kidneys are  small and shrunken consistent with end-stage renal disease. The bladder is decompressed by Foley catheter. Stomach/Bowel: No obstructive or inflammatory changes of the bowel are seen. The appendix appears within normal limits. Vascular/Lymphatic: Aortic atherosclerosis. No enlarged abdominal or pelvic lymph nodes. Reproductive: Prostate is unremarkable. Other: Mild diffuse ascites is identified. No large sizable pocket to allow for safe paracentesis is seen. Musculoskeletal: Degenerative changes of the lumbar spine are seen. No acute bony abnormality is noted. IMPRESSION: Small bilateral pleural effusions and bibasilar atelectasis. Mild ascites. Changes consistent with chronic renal disease. Electronically Signed   By: Alcide CleverMark  Lukens M.D.   On: 01/25/2018 18:56   Dg Chest 2 View  Result Date: 01/25/2018 CLINICAL DATA:  Shortness of breath and hypoxemia EXAM: CHEST - 2 VIEW COMPARISON:  Chest radiograph 10/12/2017 FINDINGS: Cardiomegaly with calcific aortic atherosclerosis, unchanged. No pulmonary edema or focal airspace consolidation. No pneumothorax or sizable pleural effusion. Basilar opacities, likely atelectasis, unchanged. IMPRESSION: Cardiomegaly and calcific aortic atherosclerosis (ICD10-I70.0). Unchanged basilar opacities, likely atelectasis. No pulmonary edema. Electronically Signed   By: Deatra RobinsonKevin  Herman M.D.   On: 01/25/2018 15:43        Scheduled Meds: . ammonium lactate   Topical Daily  . aspirin EC  81 mg Oral Daily  . [START ON 01/28/2018] calcitRIOL  1 mcg Oral Q M,W,F-HD  . calcium acetate  2,001 mg Oral TID WC  . diltiazem  240 mg Oral Daily  . feeding supplement (NEPRO CARB STEADY)  237 mL Oral BID BM  . ferric citrate  420 mg Oral TID  . heparin  5,000 Units Subcutaneous Q8H  . ipratropium-albuterol  3 mL Nebulization Q6H  . methylPREDNISolone (SOLU-MEDROL) injection  60 mg Intravenous Q12H  . metoprolol succinate  25 mg Oral Daily  . nicotine  21 mg Transdermal Once  .  pantoprazole  40 mg Oral Daily  . triamcinolone cream   Topical Daily   Continuous Infusions:   LOS: 0 days     Alwyn RenElizabeth G Mathews, MD [facility]  If 7PM-7AM, please contact night-coverage [email or web address] Password [email or web address] 01/26/2018, 11:31 AM

## 2018-01-26 NOTE — Progress Notes (Signed)
Foley catheter output was bloody and small blood clots instead of urine output. Notified overnight team, ordered to remove foley catheter. Will monitor per protocol.

## 2018-01-26 NOTE — Progress Notes (Signed)
CRITICAL VALUE ALERT  Critical Value:  K 6.3 Date & Time Notied: 0990 01/26/2018   Provider Notified: Dr. Ashley RoyaltyMatthews   Orders Received/Actions taken: pt going to dialysis this afternoon

## 2018-01-26 NOTE — Progress Notes (Signed)
Patient admitted to room 5w08 from ED, for hypoxemia and generalized abdominal pain/diarrhea. Patient alert/oriented x3, room air, patient has bilateral rhonchi/wheezing and dyspnea with activity. Patient complains of intense itching and has some scratch marks generalized and scant bleeding on back from scratching. Foley in place pink tinged urine noted, applied stat lock to secure cath. Patient oriented to room and education provided.

## 2018-01-26 NOTE — Consult Note (Addendum)
[facility] Renal Consultation Note    Indication for Consultation:  Management of ESRD/hemodialysis; anemia, hypertension/volume and secondary hyperparathyroidism PCP: Anna Genreonroy, PA- [place]  HPI: Shane [name] is a 77 y.o. 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 Per notes presented to ED from home yesterday with stomach pain and chronic diarrhea.  Vitals stable. O2 desat to  82% noted on arrival which improved with nasal oxygen.  Abd CT done in ED showing mild ascites, small bilateral pleural effusions. CXR showing basilar opacities but no edema. Labs significant for K 6.3, Cr 13.89, Platelets 102. Admitted under observation status for further evaluation    Pleasantly confused on exam today. Breathing comfortable on room air. Can't really say why he is in the hospital and denies any complaints today. Lives at home with son and disabled wife.   He is chronically underdialyzed. He missed his last 2 dialysis treatments and signs off early for most treatments. Usually leaves about 1kg over EDW  Past Medical History:  Diagnosis Date  . 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 Arthritis    bursitis in hip  . Bilateral inguinal hernia (BIH)    right > left side  . 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 Reynolds Bowloldonato  . Elevated troponin 04/12/2015  . Full dentures   . Hypertension   . Tobacco abuse   . Urinary tract infection due to Enterococcus 08/03/2015  . Varicocele    Past Surgical History:  Procedure Laterality Date  . AV FISTULA PLACEMENT, BRACHIOCEPHALIC  10/19/2010   LEFT  . AV FISTULA PLACEMENT, RADIOCEPHALIC  01/30/2011   Right w/ ligation of competing venous branch by Dr. Leonides SakeBrian Chen  . AV FISTULA PLACEMENT, RADIOCEPHALIC  09/13/2010   LEFT  . BASCILIC VEIN TRANSPOSITION Left 04/08/2015    Procedure: BASILIC VEIN TRANSPOSITION;  Surgeon: Pryor OchoaJames D Lawson, MD;  [facility];  Service: Vascular;  Laterality: Left;  . COLONOSCOPY    . INGUINAL HERNIA REPAIR Bilateral 11/25/2015   Procedure: LAPAROSCOPIC BILATERAL INGUINAL HERNIA REPAIR--EXCISION OF RIGHT HYDROCELE;  Surgeon: Karie SodaSteven Gross, MD;  Location: [facility] OR;  Service: General;  Laterality: Bilateral;  . INSERTION OF MESH Bilateral 11/25/2015   Procedure: INSERTION OF MESH;  Surgeon: Karie SodaSteven Gross, MD;  [facility];  Service: General;  Laterality: Bilateral;  . MULTIPLE TOOTH EXTRACTIONS     Family History  Problem Relation Age of Onset  . Asthma Mother   . Hypertension Mother   . Alcohol abuse Father   . Hypertension Father   . Hypertension Sister   . Diabetes Sister   . Heart disease Sister   . Hypertension Brother   . Peripheral vascular disease Brother   . Hypertension Daughter   . Heart disease Daughter    Social History:  reports that he has been smoking cigarettes.  He has a 50.00 pack-year smoking history. He has never used smokeless tobacco. He reports that he does not drink alcohol or use drugs. No Known Allergies Prior to Admission medications   Medication Sig Start Date End Date Taking? Authorizing Provider  AURYXIA 1 GM 210 MG(Fe) tablet Take 420 mg by mouth 3 (three) times daily with meals.  01/08/18  Yes [provider]  esomeprazole (NEXIUM) 20 MG capsule Take 20 mg by mouth daily before breakfast. 01/07/18  Yes [provider]   Current Facility-Administered Medications  Medication Dose Route Frequency Provider Last Rate Last Dose  . acetaminophen (TYLENOL) tablet 650 mg  650 mg Oral Q6H PRN Lorretta Harp, MD       Or  . acetaminophen (TYLENOL) suppository 650 mg  650 mg Rectal Q6H PRN Lorretta Harp, MD      . albuterol (PROVENTIL) (2.5 MG/3ML) 0.083% nebulizer solution 2.5 mg  2.5 mg Nebulization Q4H PRN Lorretta Harp, MD      . ammonium lactate (AMLACTIN) 12 % cream   Topical Daily Lorretta Harp, MD      . aspirin EC tablet 81 mg  81 mg Oral Daily Lorretta Harp, MD   81 mg at 01/26/18 0946  . calcium acetate (PHOSLO) capsule 2,001 mg  2,001 mg Oral TID WC Lorretta Harp, MD   2,001 mg at 01/26/18 0946  . dextromethorphan-guaiFENesin (MUCINEX DM) 30-600 MG per 12 hr tablet 1 tablet  1 tablet Oral BID PRN Lorretta Harp, MD      . diltiazem (CARDIZEM CD) 24 hr capsule 240 mg  240 mg Oral Daily Lorretta Harp, MD   Stopped at 01/26/18 [phone]  . feeding supplement (NEPRO CARB STEADY) liquid 237 mL  237 mL Oral BID BM Lorretta Harp, MD   237 mL at 01/26/18 0949  . ferric citrate (AURYXIA) tablet 420 mg  420 mg Oral TID Lorretta Harp, MD   420 mg at 01/26/18 0949  . heparin injection 5,000 Units  5,000 Units Subcutaneous Q8H Lorretta Harp, MD   5,000 Units at 01/25/18 2316  . hydrALAZINE (APRESOLINE) injection 5 mg  5 mg Intravenous Q2H PRN Lorretta Harp, MD      . ipratropium-albuterol (DUONEB) 0.5-2.5 (3) MG/3ML nebulizer solution 3 mL  3 mL Nebulization Q6H Hamrick, Maura L, MD      . methylPREDNISolone sodium succinate (SOLU-MEDROL) 125 mg/2 mL injection 60 mg  60 mg Intravenous Q12H Lorretta Harp, MD   60 mg at 01/26/18 0947  . metoprolol succinate (TOPROL-XL) 24 hr tablet 25 mg  25 mg Oral Daily Lorretta Harp, MD   Stopped at 01/26/18 [phone]  . nicotine (NICODERM CQ - dosed in mg/24 hours) patch 21 mg  21 mg Transdermal Once Maia Plan, MD   21 mg at 01/25/18 1441  . ondansetron (ZOFRAN) tablet 4 mg  4 mg Oral Q6H PRN Lorretta Harp, MD       Or  . ondansetron [facility]) injection 4 mg  4 mg Intravenous Q6H PRN Lorretta Harp, MD      . pantoprazole (PROTONIX) EC tablet 40 mg  40 mg Oral Daily Lorretta Harp, MD   40 mg at 01/26/18 0946  . traMADol (ULTRAM) tablet 50-100 mg  50-100 mg Oral Q6H PRN Lorretta Harp, MD   50 mg at 01/25/18 2315  . triamcinolone cream (KENALOG) 0.1 %   Topical Daily Lorretta Harp, MD      . zolpidem (AMBIEN) tablet 5 mg  5 mg Oral QHS PRN Lorretta Harp, MD        ROS: As per HPI otherwise  negative.  Physical Exam: Vitals:   01/25/18 2205 01/26/18 0339 01/26/18 0650 01/26/18 0722  BP: (!) 141/112  109/73   Pulse: 71  64   Resp: 18  16   Temp: (!) 97.4 F (36.3 C)     TempSrc: Oral     SpO2: 94% 93% 100% 97%  Weight:      Height:  General: Thin elderly male NAD  Head: NCAT sclera not icteric MMM Neck: Supple. No JVD No masses Lungs: Coarse wheezes bilaterally  Heart: RRR with S1 S2 Abdomen: soft NT + BS Lower extremities:without edema or ischemic changes, no open wounds  Neuro: A & O  X 3. Moves all extremities spontaneously. Psych:  Pleasantly confused Dialysis Access: LUE AVF +bruit   Labs: Basic Metabolic Panel: Recent Labs  Lab 01/25/18 0953 01/26/18 0714  NA 144 140  K 4.9 6.3*  CL 100* 100*  CO2 25 20*  GLUCOSE 75 113*  BUN 64* 68*  CREATININE 13.56* 13.89*  CALCIUM 9.0 8.6*   Liver Function Tests: Recent Labs  Lab 01/25/18 0953  AST 15  ALT 8*  ALKPHOS 74  BILITOT 1.5*  PROT 7.1  ALBUMIN 3.7   Recent Labs  Lab 01/25/18 0953  LIPASE 53*   No results for input(s): AMMONIA in the last 168 hours. CBC: Recent Labs  Lab 01/25/18 0953 01/26/18 0714  WBC 5.3 7.1  HGB 10.9* 11.4*  HCT 34.5* 36.1*  MCV 102.4* 101.4*  PLT 104* 102*   Cardiac Enzymes: No results for input(s): CKTOTAL, CKMB, CKMBINDEX, TROPONINI in the last 168 hours. CBG: No results for input(s): GLUCAP in the last 168 hours. Iron Studies: No results for input(s): IRON, TIBC, TRANSFERRIN, FERRITIN in the last 72 hours. Studies/Results: Ct Abdomen Pelvis Wo Contrast  Result Date: 01/25/2018 CLINICAL DATA:  Abdominal pain and diarrhea for several weeks EXAM: CT ABDOMEN AND PELVIS WITHOUT CONTRAST TECHNIQUE: Multidetector CT imaging of the abdomen and pelvis was performed following the standard protocol without IV contrast. COMPARISON:  None. FINDINGS: Lower chest: Small bilateral pleural effusions are noted with mild bibasilar atelectasis. This is new from the  prior exam. Hepatobiliary: The liver and gallbladder appear within normal limits. Pancreas: Unremarkable. No pancreatic ductal dilatation or surrounding inflammatory changes. Spleen: Normal in size without focal abnormality. Adrenals/Urinary Tract: Adrenal glands are unremarkable. The kidneys are small and shrunken consistent with end-stage renal disease. The bladder is decompressed by Foley catheter. Stomach/Bowel: No obstructive or inflammatory changes of the bowel are seen. The appendix appears within normal limits. Vascular/Lymphatic: Aortic atherosclerosis. No enlarged abdominal or pelvic lymph nodes. Reproductive: Prostate is unremarkable. Other: Mild diffuse ascites is identified. No large sizable pocket to allow for safe paracentesis is seen. Musculoskeletal: Degenerative changes of the lumbar spine are seen. No acute bony abnormality is noted. IMPRESSION: Small bilateral pleural effusions and bibasilar atelectasis. Mild ascites. Changes consistent with chronic renal disease. Electronically Signed   By: Alcide Clever M.D.   On: 01/25/2018 18:56   Dg Chest 2 View  Result Date: 01/25/2018 CLINICAL DATA:  Shortness of breath and hypoxemia EXAM: CHEST - 2 VIEW COMPARISON:  Chest radiograph 10/12/2017 FINDINGS: Cardiomegaly with calcific aortic atherosclerosis, unchanged. No pulmonary edema or focal airspace consolidation. No pneumothorax or sizable pleural effusion. Basilar opacities, likely atelectasis, unchanged. IMPRESSION: Cardiomegaly and calcific aortic atherosclerosis (ICD10-I70.0). Unchanged basilar opacities, likely atelectasis. No pulmonary edema. Electronically Signed   By: Deatra Robinson M.D.   On: 01/25/2018 15:43    Dialysis: [facility] MWF 4h   400/800  66kg  2/2.25 bath  L AVF  Hep none Sensipar PO TIW Mircera IV q 4 weeks Calcitriol 1.17mcg PO TIW   Assessment/Plan: 1. Hypoxic on arrival improved with [place]/Wheezing on exam - Does not appear grossly volume overloaded .  Nebs/Steroids per primary.  Getting Rx per primary team for COPD flare. Not fluid overloaded , no  CHF/vol excess on xray or exam.  2. Diarrhea/ abd pain  - per primary team 3. ESRD -  MWF. Missed last 2 treatments. K+ 6.3.  Plan HD today off schedule  to correct.  4. Hypertension/volume  - BP controlled/UF to EDW as tolerated  5. Anemia  - Hgb 11.4. No ESA needs  6. Metabolic bone disease -  Ca ok. Continue VDRA/Auryxia binder  7. Nutrition - Renal diet/vitamins  8. Afib -not on anticoagulation. Cardizem/metoprolol 9. Thrombocytopenia 10. Tobacco abuse 11. Confusion - per dialysis nurses "pleasant confusion" is his baseline. Is chronically underdialyzed. Will see if MS improves with HD   Tomasa Blase PA-C [facility] Pager [phone] 01/26/2018, 10:15 AM   Pt seen, examined, agree w assess/plan as above with additions as indicated.  Vinson Moselle MD [employer] pager [phone]    cell [phone] 01/26/2018, 12:39 PM

## 2018-01-27 DIAGNOSIS — R197 Diarrhea, unspecified: Secondary | ICD-10-CM | POA: Diagnosis not present

## 2018-01-27 MED ORDER — LIDOCAINE-PRILOCAINE 2.5-2.5 % EX CREA
1.0000 "application " | TOPICAL_CREAM | CUTANEOUS | Status: DC | PRN
  Filled 2018-01-27: qty 5

## 2018-01-27 MED ORDER — ALTEPLASE 2 MG IJ SOLR
2.0000 mg | Freq: Once | INTRAMUSCULAR | Status: DC | PRN
  Filled 2018-01-27: qty 2

## 2018-01-27 MED ORDER — PREDNISONE 20 MG PO TABS
20.0000 mg | ORAL_TABLET | Freq: Every day | ORAL | Status: DC
  Administered 2018-01-28 – 2018-01-29 (×2): 20 mg via ORAL
  Filled 2018-01-27 (×2): qty 1

## 2018-01-27 MED ORDER — SODIUM CHLORIDE 0.9 % IV SOLN: 100.0000 mL | INTRAVENOUS | Status: DC | PRN

## 2018-01-27 MED ORDER — LORAZEPAM 2 MG/ML IJ SOLN
0.5000 mg | Freq: Three times a day (TID) | INTRAMUSCULAR | Status: DC | PRN
  Administered 2018-01-27 – 2018-01-28 (×2): 0.5 mg via INTRAVENOUS
  Filled 2018-01-27 (×4): qty 1

## 2018-01-27 MED ORDER — HALOPERIDOL LACTATE 5 MG/ML IJ SOLN
2.0000 mg | Freq: Once | INTRAMUSCULAR | Status: AC
  Administered 2018-01-27: 2 mg via INTRAMUSCULAR
  Filled 2018-01-27: qty 1

## 2018-01-27 MED ORDER — METOPROLOL SUCCINATE ER 25 MG PO TB24
12.5000 mg | ORAL_TABLET | Freq: Every day | ORAL | Status: DC
  Administered 2018-01-28 – 2018-01-29 (×2): 12.5 mg via ORAL
  Filled 2018-01-27 (×2): qty 1

## 2018-01-27 MED ORDER — IPRATROPIUM-ALBUTEROL 0.5-2.5 (3) MG/3ML IN SOLN
3.0000 mL | Freq: Three times a day (TID) | RESPIRATORY_TRACT | Status: DC
  Administered 2018-01-27 – 2018-01-29 (×6): 3 mL via RESPIRATORY_TRACT
  Filled 2018-01-27 (×6): qty 3

## 2018-01-27 MED ORDER — LIDOCAINE HCL (PF) 1 % IJ SOLN
5.0000 mL | INTRAMUSCULAR | Status: DC | PRN
  Filled 2018-01-27: qty 5

## 2018-01-27 MED ORDER — LORAZEPAM BOLUS VIA INFUSION
0.5000 mg | Freq: Three times a day (TID) | INTRAVENOUS | Status: DC | PRN
  Filled 2018-01-27: qty 1

## 2018-01-27 MED ORDER — HEPARIN SODIUM (PORCINE) 1000 UNIT/ML DIALYSIS
1000.0000 [IU] | INTRAMUSCULAR | Status: DC | PRN
  Filled 2018-01-27: qty 1

## 2018-01-27 MED ORDER — HALOPERIDOL LACTATE 5 MG/ML IJ SOLN: 2.0000 mg | Freq: Once | INTRAMUSCULAR | Status: DC

## 2018-01-27 MED ORDER — PENTAFLUOROPROP-TETRAFLUOROETH EX AERO: 1.0000 "application " | INHALATION_SPRAY | CUTANEOUS | Status: DC | PRN

## 2018-01-27 NOTE — Progress Notes (Signed)
[facility] Progress Note  Subjective: no c/o  Vitals:   01/26/18 1827 01/26/18 2056 01/27/18 0500 01/27/18 0903  BP: 121/76  106/68 104/65  Pulse: (!) 104  77 77  Resp: 20  18   Temp: 97.8 F (36.6 C)  (!) 97.4 F (36.3 C)   TempSrc: Oral  Oral   SpO2:  94% (!) 87%   Weight: 66.3 kg (146 lb 2.6 oz)     Height:        Inpatient medications: . ammonium lactate   Topical Daily  . aspirin EC  81 mg Oral Daily  . [START ON 01/28/2018] calcitRIOL  1 mcg Oral Q M,W,F-HD  . calcium acetate  2,001 mg Oral TID WC  . diltiazem  240 mg Oral Daily  . feeding supplement (NEPRO CARB STEADY)  237 mL Oral BID BM  . ferric citrate  420 mg Oral TID  . heparin  5,000 Units Subcutaneous Q8H  . ipratropium-albuterol  3 mL Nebulization TID  . methylPREDNISolone (SOLU-MEDROL) injection  40 mg Intravenous Daily  . metoprolol succinate  25 mg Oral Daily  . nicotine  21 mg Transdermal Daily  . pantoprazole  40 mg Oral Daily  . triamcinolone cream   Topical Daily   . sodium chloride    . sodium chloride     sodium chloride, sodium chloride, acetaminophen **OR** acetaminophen, albuterol, alteplase, dextromethorphan-guaiFENesin, heparin, hydrALAZINE, lidocaine (PF), lidocaine-prilocaine, LORazepam, ondansetron **OR** ondansetron (ZOFRAN) IV, pentafluoroprop-tetrafluoroeth, traMADol  Exam: General: Thin elderly male NAD  Head: NCAT sclera not icteric MMM Neck: Supple. No JVD No masses Lungs: Coarse wheezes bilaterally  Heart: RRR with S1 S2 Abdomen: soft NT + BS Lower extremities:without edema or ischemic changes, no open wounds  Neuro: A & O  X 3. Moves all extremities spontaneously. Psych:  Pleasantly confused Dialysis Access: LUE AVF +bruit     Dialysis: [facility] MWF 4h   400/800  66kg  2/2.25 bath  L AVF  Hep none Sensipar 30mcg PO TIW Mircera 30mcg IV q 4 weeks Calcitriol 1.170mcg PO TIW   Assessment/Plan: 1. Hypoxia/ wheezing - agree w/ assessment of COPD. Rx underway  for COPD flare, wheezing better. CXR was negative, pt not vol overloaded.  2. Diarrhea/ abd pain  - per primary team 3. ESRD -  MWF. Missed last 2 treatments. Down to dry wt after 3L UF Sat. HD tomorrow.   4. Hypertension/volume  - on po dilt and BB, continue. Volume as above 5. Anemia  - Hgb 11.4. No ESA needs  6. Metabolic bone disease -  Ca ok. Continue VDRA/Auryxia binder  7. Nutrition - Renal diet/vitamins  8. Afib -not on anticoagulation. Cardizem/metoprolol 9. Thrombocytopenia 10. Tobacco abuse 11. Confusion - per dialysis nurses "pleasant confusion" is his baseline. Is chronically underdialyzed. See if MS improves with HD.     Vinson Moselleob Gaudencio Chesnut MD [facility] pager [phone]   01/27/2018, 11:59 AM   Recent Labs  Lab 01/25/18 0953 01/26/18 0714  NA 144 140  K 4.9 6.3*  CL 100* 100*  CO2 25 20*  GLUCOSE 75 113*  BUN 64* 68*  CREATININE 13.56* 13.89*  CALCIUM 9.0 8.6*   Recent Labs  Lab 01/25/18 0953  AST 15  ALT 8*  ALKPHOS 74  BILITOT 1.5*  PROT 7.1  ALBUMIN 3.7   Recent Labs  Lab 01/25/18 0953 01/26/18 0714  WBC 5.3 7.1  HGB 10.9* 11.4*  HCT 34.5* 36.1*  MCV 102.4* 101.4*  PLT 104* 102*  Iron/TIBC/Ferritin/ %Sat No results found for: IRON, TIBC, FERRITIN, IRONPCTSAT   

## 2018-01-27 NOTE — Progress Notes (Signed)
PROGRESS NOTE    Shane [name]  ZOX:[id number] DOB: 1940/10/18 DOA: 01/25/2018 PCP: [name], MD  Brief Narrative:77 y.o.malewith medical history significant ofESRD (MWF), hypertension, tobacco abuse, GERD, atrial fibrillation not on anticoagulants, amyloid nephropathy, who presents with diarrhea, abdominal pain and SOB  Patient states that he has been having diarrhea for more than 4 weeks. He has a 1.3 watery diarrhea each day average. Denies nausea or vomiting. He states that he has intermittent mild diffuse abdominal pain. No bloody stool. Patient states that he has mild dry cough and mild shortness of breath. No chest pain, fever or chills. No runny nose or sore throat. He missed dialysis on Wednesday and 21-Feb-2023 due to family member death.  Patient lives at home with his wife and son.  ED Course:pt was found to haveWBC 5.3, lipase 53, negative urinalysis, potassium 4.9, bicarbonate of 25, creatinine 13.56, BUN 64, temperature normal, no tachycardia, has tachypnea, oxygen desaturation to 82% on room air, which improved to 100% on 2 L nasal cannula oxygen. Chest x-ray showed cardiomegaly, no pulmonary edema or infiltration. CT abdomen/pelvis showed mild ascites and bilateral small pleural effusion, otherwise not impressive. Patient is placed on telemetry bed for observation.    Assessment & Plan:   Principal Problem:   Diarrhea Active Problems:   Tobacco abuse   ESRD on dialysis [facility])   Essential hypertension   GERD (gastroesophageal reflux disease)   Abdominal pain   Acute respiratory failure with hypoxia (HCC)   Atrial fibrillation, chronic (HCC)   Thrombocytopenia (HCC) Diarrhea-has been resolved.  Not had any diarrhea for the last 48 hours.  And mild abdominal pain  Acute respiratory failure with hypoxia:taper steroids.continue svn.Oxygen desaturation to 88% on room airGiven history of smoking, patient may have undiagnosed COPD. chest x-ray did showed  pulmonary edema.  Patient went to dialysis yesterday to make a further missing sessions.    Tobacco abuse: -Did counseling about importance of quitting smoking -Nicotine patch  ESRD on dialysis (MWF): Per renal.  HTN: Soft. Monitor. -Continue home medications:Cardizem, metoprolol -IV hydralazine prn -Decrease metoprolol to 12.5 mg daily  ?  Underlying dementia-staff reports that patient is getting more and more agitated.  In review of the notes shows that patient is pleasantly confused at dialysis.  Will try small dose of Ativan.      DVT prophylaxis: SCD Code Status: Full code Family Communication: No family available Disposition Plan: TBD Consultants:  Nephrology Procedures: None Antimicrobials none  Subjective: Denies any nausea vomiting or diarrhea.  Objective Vitals:   01/26/18 1827 01/26/18 2056 01/27/18 0500 01/27/18 0903  BP: 121/76  106/68 104/65  Pulse: (!) 104  77 77  Resp: 20  18   Temp: 97.8 F (36.6 C)  (!) 97.4 F (36.3 C)   TempSrc: Oral  Oral   SpO2:  94% (!) 87%   Weight: 66.3 kg (146 lb 2.6 oz)     Height:        Intake/Output Summary (Last 24 hours) at 01/27/2018 1223 Last data filed at 01/27/2018 0901 Gross per 24 hour  Intake 120 ml  Output 3000 ml  Net -2880 ml   Filed Weights   01/26/18 1328 01/26/18 1808 01/26/18 1827  Weight: 69.3 kg (152 lb 12.5 oz) 66.3 kg (146 lb 2.6 oz) 66.3 kg (146 lb 2.6 oz)    Examination:  General exam: Appears calm and comfortable  Respiratory system: Clear to auscultation. Respiratory effort normal. Cardiovascular system: S1 & S2 heard, RRR. No  JVD, murmurs, rubs, gallops or clicks. No pedal edema. Gastrointestinal system: Abdomen is nondistended, soft and nontender. No organomegaly or masses felt. Normal bowel sounds heard. Central nervous system: Alert and oriented. No focal neurological deficits. Extremities: Symmetric 5 x 5 power. Skin: No rashes, lesions or ulcers Psychiatry: Judgement and  insight appear normal. Mood & affect appropriate.     Data Reviewed: I have personally reviewed following labs and imaging studies  CBC: Recent Labs  Lab 01/25/18 0953 01/26/18 0714  WBC 5.3 7.1  HGB 10.9* 11.4*  HCT 34.5* 36.1*  MCV 102.4* 101.4*  PLT 104* 102*   Basic Metabolic Panel: Recent Labs  Lab 01/25/18 0953 01/26/18 0714  NA 144 140  K 4.9 6.3*  CL 100* 100*  CO2 25 20*  GLUCOSE 75 113*  BUN 64* 68*  CREATININE 13.56* 13.89*  CALCIUM 9.0 8.6*   GFR: Estimated Creatinine Clearance: 4.2 mL/min (A) (by C-G formula based on SCr of 13.89 mg/dL (H)). Liver Function Tests: Recent Labs  Lab 01/25/18 0953  AST 15  ALT 8*  ALKPHOS 74  BILITOT 1.5*  PROT 7.1  ALBUMIN 3.7   Recent Labs  Lab 01/25/18 0953  LIPASE 53*   No results for input(s): AMMONIA in the last 168 hours. Coagulation Profile: No results for input(s): INR, PROTIME in the last 168 hours. Cardiac Enzymes: No results for input(s): CKTOTAL, CKMB, CKMBINDEX, TROPONINI in the last 168 hours. BNP (last 3 results) No results for input(s): PROBNP in the last 8760 hours. HbA1C: No results for input(s): HGBA1C in the last 72 hours. CBG: Recent Labs  Lab 01/26/18 2314  GLUCAP 103*   Lipid Profile: No results for input(s): CHOL, HDL, LDLCALC, TRIG, CHOLHDL, LDLDIRECT in the last 72 hours. Thyroid Function Tests: No results for input(s): TSH, T4TOTAL, FREET4, T3FREE, THYROIDAB in the last 72 hours. Anemia Panel: No results for input(s): VITAMINB12, FOLATE, FERRITIN, TIBC, IRON, RETICCTPCT in the last 72 hours. Sepsis Labs: No results for input(s): PROCALCITON, LATICACIDVEN in the last 168 hours.  Recent Results (from the past 240 hour(s))  MRSA PCR Screening     Status: None   Collection Time: 01/25/18 10:13 PM  Result Value Ref Range Status   MRSA by PCR NEGATIVE NEGATIVE Final    Comment:        The GeneXpert MRSA Assay (FDA approved for NASAL specimens only), is one component of  a comprehensive MRSA colonization surveillance program. It is not intended to diagnose MRSA infection nor to guide or monitor treatment for MRSA infections. Performed at [facility], [address] [place], [place] [address]          Radiology Studies: Ct Abdomen Pelvis Wo Contrast  Result Date: 01/25/2018 CLINICAL DATA:  Abdominal pain and diarrhea for several weeks EXAM: CT ABDOMEN AND PELVIS WITHOUT CONTRAST TECHNIQUE: Multidetector CT imaging of the abdomen and pelvis was performed following the standard protocol without IV contrast. COMPARISON:  None. FINDINGS: Lower chest: Small bilateral pleural effusions are noted with mild bibasilar atelectasis. This is new from the prior exam. Hepatobiliary: The liver and gallbladder appear within normal limits. Pancreas: Unremarkable. No pancreatic ductal dilatation or surrounding inflammatory changes. Spleen: Normal in size without focal abnormality. Adrenals/Urinary Tract: Adrenal glands are unremarkable. The kidneys are small and shrunken consistent with end-stage renal disease. The bladder is decompressed by Foley catheter. Stomach/Bowel: No obstructive or inflammatory changes of the bowel are seen. The appendix appears within normal limits. Vascular/Lymphatic: Aortic atherosclerosis. No enlarged abdominal or  pelvic lymph nodes. Reproductive: Prostate is unremarkable. Other: Mild diffuse ascites is identified. No large sizable pocket to allow for safe paracentesis is seen. Musculoskeletal: Degenerative changes of the lumbar spine are seen. No acute bony abnormality is noted. IMPRESSION: Small bilateral pleural effusions and bibasilar atelectasis. Mild ascites. Changes consistent with chronic renal disease. Electronically Signed   By: Alcide Clever M.D.   On: 01/25/2018 18:56   Dg Chest 2 View  Result Date: 01/25/2018 CLINICAL DATA:  Shortness of breath and hypoxemia EXAM: CHEST - 2 VIEW COMPARISON:  Chest radiograph 10/12/2017 FINDINGS:  Cardiomegaly with calcific aortic atherosclerosis, unchanged. No pulmonary edema or focal airspace consolidation. No pneumothorax or sizable pleural effusion. Basilar opacities, likely atelectasis, unchanged. IMPRESSION: Cardiomegaly and calcific aortic atherosclerosis (ICD10-I70.0). Unchanged basilar opacities, likely atelectasis. No pulmonary edema. Electronically Signed   By: Deatra Robinson M.D.   On: 01/25/2018 15:43        Scheduled Meds: . ammonium lactate   Topical Daily  . aspirin EC  81 mg Oral Daily  . [START ON 01/28/2018] calcitRIOL  1 mcg Oral Q M,W,F-HD  . calcium acetate  2,001 mg Oral TID WC  . diltiazem  240 mg Oral Daily  . feeding supplement (NEPRO CARB STEADY)  237 mL Oral BID BM  . ferric citrate  420 mg Oral TID  . heparin  5,000 Units Subcutaneous Q8H  . ipratropium-albuterol  3 mL Nebulization TID  . methylPREDNISolone (SOLU-MEDROL) injection  40 mg Intravenous Daily  . metoprolol succinate  25 mg Oral Daily  . nicotine  21 mg Transdermal Daily  . pantoprazole  40 mg Oral Daily  . triamcinolone cream   Topical Daily   Continuous Infusions: . sodium chloride    . sodium chloride       LOS: 0 days      Alwyn Ren, MD [facility] If 7PM-7AM, please contact night-coverage [email or web address] Password [facility] 01/27/2018, 12:23 PM

## 2018-01-27 NOTE — Plan of Care (Signed)
  Problem: Clinical Measurements: Goal: Diagnostic test results will improve Outcome: Progressing Goal: Respiratory complications will improve Outcome: Progressing   

## 2018-01-27 NOTE — Progress Notes (Addendum)
Responded to bed alarm to find patient attempting to get out of the bed. Patient very confused and combative, attempting to stand although very unsteady and verbally aggressive with staff. Pt had pulled out his IV and pulled off his telemetry leads and would not let any staff assist him. Attempted to de-escalate and reorient the patient, but was unsuccessful as patient continued to be very disoriented. Paged Blount, NP and received order for Haldol 2 mg intramuscular which was administered to patient in right deltoid. Patient calmed down enough to lie down in bed, and tech stayed in room for safety. Will continue to monitor and treat.

## 2018-01-28 DIAGNOSIS — R197 Diarrhea, unspecified: Secondary | ICD-10-CM | POA: Diagnosis not present

## 2018-01-28 LAB — RENAL FUNCTION PANEL
ANION GAP: 16 — AB (ref 5–15)
Albumin: 3.3 g/dL — ABNORMAL LOW (ref 3.5–5.0)
BUN: 61 mg/dL — ABNORMAL HIGH (ref 6–20)
CHLORIDE: 95 mmol/L — AB (ref 101–111)
CO2: 23 mmol/L (ref 22–32)
CREATININE: 10.2 mg/dL — AB (ref 0.61–1.24)
Calcium: 9.4 mg/dL (ref 8.9–10.3)
GFR, EST AFRICAN AMERICAN: 5 mL/min — AB (ref >60)
GFR, EST NON AFRICAN AMERICAN: 4 mL/min — AB (ref >60)
Glucose, Bld: 122 mg/dL — ABNORMAL HIGH (ref 65–99)
Phosphorus: 8.3 mg/dL — ABNORMAL HIGH (ref 2.5–4.6)
Potassium: 5.4 mmol/L — ABNORMAL HIGH (ref 3.5–5.1)
Sodium: 134 mmol/L — ABNORMAL LOW (ref 135–145)

## 2018-01-28 LAB — CBC
HCT: 30.4 % — ABNORMAL LOW (ref 39.0–52.0)
HEMOGLOBIN: 10.2 g/dL — AB (ref 13.0–17.0)
MCH: 33.1 pg (ref 26.0–34.0)
MCHC: 33.6 g/dL (ref 30.0–36.0)
MCV: 98.7 fL (ref 78.0–100.0)
PLATELETS: 123 10*3/uL — AB (ref 150–400)
RBC: 3.08 MIL/uL — AB (ref 4.22–5.81)
RDW: 15.2 % (ref 11.5–15.5)
WBC: 6.6 10*3/uL (ref 4.0–10.5)

## 2018-01-28 MED ORDER — HALOPERIDOL 0.5 MG PO TABS
0.5000 mg | ORAL_TABLET | Freq: Four times a day (QID) | ORAL | Status: DC | PRN
  Administered 2018-01-29: 0.5 mg via ORAL
  Filled 2018-01-28 (×2): qty 1

## 2018-01-28 MED ORDER — CALCITRIOL 0.5 MCG PO CAPS
ORAL_CAPSULE | ORAL | Status: AC
  Filled 2018-01-28: qty 2

## 2018-01-28 NOTE — Progress Notes (Signed)
PROGRESS NOTE    Shane [name]  ONG:[id number] DOB: [birth year] DOA: 01/25/2018 PCP: [name], MD  Brief Narrative:77 y.o.malewith medical history significant ofESRD (MWF), hypertension, tobacco abuse, GERD, atrial fibrillation not on anticoagulants, amyloid nephropathy, who presents with diarrhea, abdominal pain and SOB  Patient states that he has been having diarrhea for more than 4 weeks. He has a 1.3 watery diarrhea each day average. Denies nausea or vomiting. He states that he has intermittent mild diffuse abdominal pain. No bloody stool. Patient states that he has mild dry cough and mild shortness of breath. No chest pain, fever or chills. No runny nose or sore throat. He missed dialysis on Wednesday and 02/22/23 due to 77 family member death.Patient lives at home with his wife and son.  ED Course:pt was found to haveWBC 5.3, lipase 53, negative urinalysis, potassium 4.9, bicarbonate of 25, creatinine 13.56, BUN 64, temperature normal, no tachycardia, has tachypnea, oxygen desaturation to 82% on room air, which improved to 100% on 2 L nasal cannula oxygen. Chest x-ray showed cardiomegaly, no pulmonary edema or infiltration. CT abdomen/pelvis showed mild ascites and bilateral small pleural effusion, otherwise not impressive. Patient is placed on telemetry bed for observation.     Assessment & Plan:   Principal Problem:   Diarrhea Active Problems:   Tobacco abuse   ESRD on dialysis [facility])   Essential hypertension   GERD (gastroesophageal reflux disease)   Abdominal pain   Acute respiratory failure with hypoxia (HCC)   Atrial fibrillation, chronic (HCC)   Thrombocytopenia (HCC)  Diarrhea-has been resolved.  Not had any diarrhea for the last 48 hours.  And mild abdominal pain  Acute respiratory failure with hypoxia:taper steroids.continue svn.Oxygen desaturation to 88% on room airGiven history of smoking, patient may have undiagnosed COPD. chest x-ray did showed  pulmonary edema.Patient went to dialysis yesterday to make a further missing sessions.    Tobacco abuse: -Did counseling about importance of quitting smoking -Nicotine patch  ESRD on dialysis (MWF): Per renal.  HTN: Soft. Monitor. -Continue home medications:Cardizem, metoprolol -IV hydralazine prn -Decrease metoprolol to 12.5 mg daily  ?  Underlying dementia-staff reports that patient is getting more and more agitated.  In review of the notes shows that patient is pleasantly confused at dialysis.  Will try small dose of Ativan.       DVT prophylaxis: scd Code Status: full Family Communication will call  Disposition Plan: Plan dc in 1 to 2 days  Consultants: nephro  Procedures:none Antimicrobials: none  Subjective:very agitated after dialysis   Objective: Vitals:   01/28/18 1130 01/28/18 1207 01/28/18 1249 01/28/18 1454  BP: 136/74 120/60 128/84   Pulse: 88 88 92   Resp:  20 (!) 24   Temp:  97.6 F (36.4 C) 97.7 F (36.5 C)   TempSrc:  Oral Oral   SpO2:  96% 95% 92%  Weight:  64.8 kg (142 lb 13.7 oz)    Height:        Intake/Output Summary (Last 24 hours) at 01/28/2018 1521 Last data filed at 01/28/2018 1207 Gross per 24 hour  Intake 0 ml  Output 1000 ml  Net -1000 ml   Filed Weights   01/26/18 1827 01/28/18 0800 01/28/18 1207  Weight: 66.3 kg (146 lb 2.6 oz) 66.1 kg (145 lb 11.6 oz) 64.8 kg (142 lb 13.7 oz)    Examination:  General exam: Appears calm and comfortable  Respiratory system: scattered rhonchi  to auscultation. Respiratory effort normal. Cardiovascular system: S1 & S2 heard,  RRR. No JVD, murmurs, rubs, gallops or clicks. No pedal edema. Gastrointestinal system: Abdomen is nondistended, soft and nontender. No organomegaly or masses felt. Normal bowel sounds heard. Central nervous system: Alert and oriented. No focal neurological deficits. Extremities: Symmetric 5 x 5 power. Skin: No rashes, lesions or ulcers Psychiatry:  Judgement and insight appear normal. Mood & affect appropriate.     Data Reviewed: I have personally reviewed following labs and imaging studies  CBC: Recent Labs  Lab 01/25/18 0953 01/26/18 0714 01/28/18 0835  WBC 5.3 7.1 6.6  HGB 10.9* 11.4* 10.2*  HCT 34.5* 36.1* 30.4*  MCV 102.4* 101.4* 98.7  PLT 104* 102* 123*   Basic Metabolic Panel: Recent Labs  Lab 01/25/18 0953 01/26/18 0714 01/28/18 0835  NA 144 140 134*  K 4.9 6.3* 5.4*  CL 100* 100* 95*  CO2 25 20* 23  GLUCOSE 75 113* 122*  BUN 64* 68* 61*  CREATININE 13.56* 13.89* 10.20*  CALCIUM 9.0 8.6* 9.4  PHOS  --   --  8.3*   GFR: Estimated Creatinine Clearance: 5.6 mL/min (A) (by C-G formula based on SCr of 10.2 mg/dL (H)). Liver Function Tests: Recent Labs  Lab 01/25/18 0953 01/28/18 0835  AST 15  --   ALT 8*  --   ALKPHOS 74  --   BILITOT 1.5*  --   PROT 7.1  --   ALBUMIN 3.7 3.3*   Recent Labs  Lab 01/25/18 0953  LIPASE 53*   No results for input(s): AMMONIA in the last 168 hours. Coagulation Profile: No results for input(s): INR, PROTIME in the last 168 hours. Cardiac Enzymes: No results for input(s): CKTOTAL, CKMB, CKMBINDEX, TROPONINI in the last 168 hours. BNP (last 3 results) No results for input(s): PROBNP in the last 8760 hours. HbA1C: No results for input(s): HGBA1C in the last 72 hours. CBG: Recent Labs  Lab 01/26/18 2314  GLUCAP 103*   Lipid Profile: No results for input(s): CHOL, HDL, LDLCALC, TRIG, CHOLHDL, LDLDIRECT in the last 72 hours. Thyroid Function Tests: No results for input(s): TSH, T4TOTAL, FREET4, T3FREE, THYROIDAB in the last 72 hours. Anemia Panel: No results for input(s): VITAMINB12, FOLATE, FERRITIN, TIBC, IRON, RETICCTPCT in the last 72 hours. Sepsis Labs: No results for input(s): PROCALCITON, LATICACIDVEN in the last 168 hours.  Recent Results (from the past 240 hour(s))  MRSA PCR Screening     Status: None   Collection Time: 01/25/18 10:13 PM  Result  Value Ref Range Status   MRSA by PCR NEGATIVE NEGATIVE Final    Comment:        The GeneXpert MRSA Assay (FDA approved for NASAL specimens only), is one component of a comprehensive MRSA colonization surveillance program. It is not intended to diagnose MRSA infection nor to guide or monitor treatment for MRSA infections. Performed at [facility], [address] [place], [place] [address]          Radiology Studies: No results found.      Scheduled Meds: . ammonium lactate   Topical Daily  . aspirin EC  81 mg Oral Daily  . calcitRIOL  1 mcg Oral Q M,W,F-HD  . calcium acetate  2,001 mg Oral TID WC  . diltiazem  240 mg Oral Daily  . feeding supplement (NEPRO CARB STEADY)  237 mL Oral BID BM  . ferric citrate  420 mg Oral TID  . heparin  5,000 Units Subcutaneous Q8H  . ipratropium-albuterol  3 mL Nebulization TID  . metoprolol succinate  12.5 mg Oral Daily  . nicotine  21 mg Transdermal Daily  . pantoprazole  40 mg Oral Daily  . predniSONE  20 mg Oral Q breakfast  . triamcinolone cream   Topical Daily   Continuous Infusions:   LOS: 0 days     Alwyn Ren, MD [facility]  If 7PM-7AM, please contact night-coverage [email or web address] Password [facility] 01/28/2018, 3:21 PM

## 2018-01-28 NOTE — Procedures (Signed)
He is resting on HD, goal of 1000cc due to lack of overload, BP is OK, AV access ok with BFR 400cc/min. Lauris PoagAlvin C Shayleen Eppinger, MD

## 2018-01-28 NOTE — Plan of Care (Signed)
  Problem: Education: Goal: Knowledge of General Education information will improve Outcome: Progressing   Problem: Health Behavior/Discharge Planning: Goal: Ability to manage health-related needs will improve Outcome: Progressing   Problem: Clinical Measurements: Goal: Ability to maintain clinical measurements within normal limits will improve Outcome: Progressing Goal: Will remain free from infection Outcome: Progressing Goal: Diagnostic test results will improve Outcome: Progressing Goal: Cardiovascular complication will be avoided Outcome: Progressing   Problem: Activity: Goal: Risk for activity intolerance will decrease Outcome: Progressing   Problem: Nutrition: Goal: Adequate nutrition will be maintained Outcome: Progressing   Problem: Elimination: Goal: Will not experience complications related to bowel motility Outcome: Progressing Goal: Will not experience complications related to urinary retention Outcome: Progressing   Problem: Pain Managment: Goal: General experience of comfort will improve Outcome: Progressing   Problem: Safety: Goal: Ability to remain free from injury will improve Outcome: Progressing   Problem: Skin Integrity: Goal: Risk for impaired skin integrity will decrease Outcome: Progressing   

## 2018-01-29 ENCOUNTER — Other Ambulatory Visit: Payer: Self-pay

## 2018-01-29 DIAGNOSIS — R197 Diarrhea, unspecified: Secondary | ICD-10-CM | POA: Diagnosis not present

## 2018-01-29 MED ORDER — CALCIUM ACETATE 667 MG PO CAPS: 2001.0000 mg | ORAL_CAPSULE | Freq: Three times a day (TID) | ORAL | 1 refills | Status: DC

## 2018-01-29 MED ORDER — NICOTINE 21 MG/24HR TD PT24: 21.0000 mg | MEDICATED_PATCH | Freq: Every day | TRANSDERMAL | 0 refills | Status: DC

## 2018-01-29 MED ORDER — CALCITRIOL 0.5 MCG PO CAPS: 1.0000 ug | ORAL_CAPSULE | ORAL | 0 refills | Status: DC

## 2018-01-29 MED ORDER — LORAZEPAM 2 MG/ML IJ SOLN
0.5000 mg | Freq: Once | INTRAMUSCULAR | Status: AC
  Administered 2018-01-29: 0.5 mg via INTRAMUSCULAR

## 2018-01-29 MED ORDER — METOPROLOL SUCCINATE ER 25 MG PO TB24: 12.5000 mg | ORAL_TABLET | Freq: Every day | ORAL | 0 refills | Status: DC

## 2018-01-29 MED ORDER — DM-GUAIFENESIN ER 30-600 MG PO TB12: 1.0000 | ORAL_TABLET | Freq: Two times a day (BID) | ORAL | Status: DC | PRN

## 2018-01-29 MED ORDER — AMMONIUM LACTATE 12 % EX CREA: TOPICAL_CREAM | Freq: Every day | CUTANEOUS | 0 refills | Status: DC

## 2018-01-29 MED ORDER — TRIAMCINOLONE ACETONIDE 0.1 % EX CREA: TOPICAL_CREAM | Freq: Every day | CUTANEOUS | 0 refills | Status: DC

## 2018-01-29 MED ORDER — LORAZEPAM 2 MG/ML IJ SOLN: 1.0000 mg | Freq: Once | INTRAMUSCULAR | Status: DC

## 2018-01-29 NOTE — Progress Notes (Signed)
[facility] Progress Note  Subjective: Resting, comfortable this morning. No c/os.   Vitals:   01/28/18 1535 01/28/18 2032 01/28/18 2123 01/29/18 0819  BP: 117/85  113/85   Pulse: 84  95   Resp: 20  16   Temp: 97.9 F (36.6 C)  98.8 F (37.1 C)   TempSrc:   Axillary   SpO2: (!) 78% 94% 100% 95%  Weight:      Height:        Inpatient medications: . ammonium lactate   Topical Daily  . aspirin EC  81 mg Oral Daily  . calcitRIOL  1 mcg Oral Q M,W,F-HD  . calcium acetate  2,001 mg Oral TID WC  . diltiazem  240 mg Oral Daily  . feeding supplement (NEPRO CARB STEADY)  237 mL Oral BID BM  . ferric citrate  420 mg Oral TID  . heparin  5,000 Units Subcutaneous Q8H  . LORazepam  1 mg Intramuscular Once  . metoprolol succinate  12.5 mg Oral Daily  . nicotine  21 mg Transdermal Daily  . pantoprazole  40 mg Oral Daily  . predniSONE  20 mg Oral Q breakfast  . triamcinolone cream   Topical Daily    acetaminophen **OR** acetaminophen, albuterol, dextromethorphan-guaiFENesin, haloperidol, hydrALAZINE, LORazepam, ondansetron **OR** ondansetron (ZOFRAN) IV, traMADol  Exam: General: Thin elderly male NAD  Lungs: CTAB Heart: RRR with S1 S2 Abdomen: soft NT + BS Lower extremities: No LE edema  Dialysis Access: LUE AVF +bruit     Dialysis: [facility] MWF 4h   400/800  66kg  2/2.25 bath  L AVF  Hep none Sensipar 30mcg PO TIW Mircera 30mcg IV q 4 weeks Calcitriol 1.800mcg PO TIW   Assessment/Plan: 1. Hypoxia/ wheezing - agree w/ assessment of COPD. Rx underway for COPD flare, wheezing better. CXR was negative, pt not vol overloaded.  2. Diarrhea/ abd pain  - per primary team 3. ESRD -  MWF. Orders written for HD tomorrow if still here  4. Hypertension/volume  - on po dilt and BB, continue. Volume as above 5. Anemia  - Hgb 11.4>10.2 Follow trend  6. Metabolic bone disease -  Ca ok. Continue VDRA/Auryxia binder  7. Nutrition - Renal diet/vitamins  8. Afib -not on  anticoagulation. Cardizem/metoprolol 9. Thrombocytopenia -improving  10. Tobacco abuse 11. Confusion/underlying dementia  - not at baseline per daughter   Tomasa BlaseOgechi Grace Ejigiri PA-C [facility] Pager [phone] 01/29/2018,10:27 AM    Recent Labs  Lab 01/25/18 [phone] 01/26/18 0714 01/28/18 0835  NA 144 140 134*  K 4.9 6.3* 5.4*  CL 100* 100* 95*  CO2 25 20* 23  GLUCOSE 75 113* 122*  BUN 64* 68* 61*  CREATININE 13.56* 13.89* 10.20*  CALCIUM 9.0 8.6* 9.4  PHOS  --   --  8.3*   Recent Labs  Lab 01/25/18 0953 01/28/18 0835  AST 15  --   ALT 8*  --   ALKPHOS 74  --   BILITOT 1.5*  --   PROT 7.1  --   ALBUMIN 3.7 3.3*   Recent Labs  Lab 01/25/18 0953 01/26/18 0714 01/28/18 0835  WBC 5.3 7.1 6.6  HGB 10.9* 11.4* 10.2*  HCT 34.5* 36.1* 30.4*  MCV 102.4* 101.4* 98.7  PLT 104* 102* 123*   Iron/TIBC/Ferritin/ %Sat No results found for: IRON, TIBC, FERRITIN, IRONPCTSAT

## 2018-01-29 NOTE — Progress Notes (Addendum)
[facility] called CSW back and stated that they do not need anything from CSW but that patient's daughter needs to speak with them to finalize things. CSW left daughter voicemail with the info.   10:23am-CSW received consult regarding snf placement. Per patient's daughter, Shane [name], she has been working on getting patient into the [facility] day program with Zella BallRobin [phone]) and they needed to know if patient qualified for 3 or 5 days of care. Patient would be able to go there and to dialysis during the day and be able to return home at night. CSW left Zella BallRobin a voicemail to see if she needs anything from CSW.   Osborne Cascoadia Lynsey Ange LCSW [phone]

## 2018-01-29 NOTE — Discharge Summary (Addendum)
Physician Discharge Summary  Shane [name] ZOX:[id number] DOB: 1940-12-02 DOA: 01/25/2018  PCP: [name], MD  Admit date: 01/25/2018 Discharge date: 01/29/2018  Admitted From: Home Disposition: Home  Recommendations for Outpatient Follow-up:  1. Follow up with PCP in 1-2 weeks 2. Please obtain BMP/CBC in one week  Home Health yes Equipment/Devices: None Discharge Condition: Stable CODE STATUS: Full code Diet recommendation: Cardiac renal Brief/Interim Summary:77 y.o.malewith medical history significant ofESRD (MWF), hypertension, tobacco abuse, GERD, atrial fibrillation not on anticoagulants, amyloid nephropathy, who presents with diarrhea, abdominal pain and SOB  Patient states that he has been having diarrhea for more than 4 weeks. He has a 1.3 watery diarrhea each day average. Denies nausea or vomiting. He states that he has intermittent mild diffuse abdominal pain. No bloody stool. Patient states that he has mild dry cough and mild shortness of breath. No chest pain, fever or chills. No runny nose or sore throat. He 77 missed dialysis on Wednesday and 02-28-2023 due to family member death.Patient lives at home with his wife and son.  ED Course:pt was found to haveWBC 5.3, lipase 53, negative urinalysis, potassium 4.9, bicarbonate of 25, creatinine 13.56, BUN 64, temperature normal, no tachycardia, has tachypnea, oxygen desaturation to 82% on room air, which improved to 100% on 2 L nasal cannula oxygen. Chest x-ray showed cardiomegaly, no pulmonary edema or infiltration. CT abdomen/pelvis showed mild ascites and bilateral small pleural effusion, otherwise not impressive. Patient is placed on telemetry bed for observation.  01/29/2018 discussed with son-in-law who was in the room with the patient who reported that every time he gets admitted to the hospital he gets agitated and confused.  At home he is at his baseline.  Patient lives at home with his wife and his daughter.  His  daughter is present 24 hours a day at home.  He also gets home health.    Discharge Diagnoses:  Principal Problem:   Diarrhea Active Problems:   Tobacco abuse   ESRD on dialysis [facility])   Essential hypertension   GERD (gastroesophageal reflux disease)   Abdominal pain   Acute respiratory failure with hypoxia (HCC)   Atrial fibrillation, chronic (HCC)   Thrombocytopenia (HCC) chronic Diarrhea-has been resolved.   Acute respiratory failure with hypoxia: Resolved after dialysis.  Has been missing dialysis.  Tobacco abuse: -Did counseling about importance of quitting smoking -Nicotine patch  ESRD on dialysis (MWF   HTN: -Continue home medications:Cardizem, metoprolol -Decrease metoprolol to 12.5 mg daily  Underlying dementia-staff reports that patient is getting more and more agitated. In review of the notes shows that patient is pleasantly confused to dialysis.   Discharge Instructions  Discharge Instructions    Call MD for:  difficulty breathing, headache or visual disturbances   Complete by:  As directed    Call MD for:  persistant dizziness or light-headedness   Complete by:  As directed    Call MD for:  persistant nausea and vomiting   Complete by:  As directed    Call MD for:  severe uncontrolled pain   Complete by:  As directed    Call MD for:  temperature >100.4   Complete by:  As directed    Diet - low sodium heart healthy   Complete by:  As directed    Increase activity slowly   Complete by:  As directed      Allergies as of 01/29/2018   No Known Allergies     Medication List    TAKE these  medications   ammonium lactate 12 % cream Commonly known as:  AMLACTIN Apply topically daily. Start taking on:  01/30/2018 What changed:  how much to take   AURYXIA 1 GM 210 MG(Fe) tablet Generic drug:  ferric citrate Take 420 mg by mouth 3 (three) times daily with meals.   calcitRIOL 0.5 MCG capsule Commonly known as:  ROCALTROL Take 2 capsules (1  mcg total) by mouth every Monday, Wednesday, and Friday with hemodialysis. Start taking on:  01/30/2018   calcium acetate 667 MG capsule Commonly known as:  PHOSLO Take 3 capsules (2,001 mg total) by mouth 3 (three) times daily with meals.   dextromethorphan-guaiFENesin 30-600 MG 12hr tablet Commonly known as:  MUCINEX DM Take 1 tablet by mouth 2 (two) times daily as needed for cough.   esomeprazole 20 MG capsule Commonly known as:  NEXIUM Take 20 mg by mouth daily before breakfast.   metoprolol succinate 25 MG 24 hr tablet Commonly known as:  TOPROL-XL Take 0.5 tablets (12.5 mg total) by mouth daily. Start taking on:  01/30/2018 What changed:  how much to take   nicotine 21 mg/24hr patch Commonly known as:  NICODERM CQ - dosed in mg/24 hours Place 1 patch (21 mg total) onto the skin daily. Start taking on:  01/30/2018   triamcinolone cream 0.1 % Commonly known as:  KENALOG Apply topically daily. Start taking on:  01/30/2018      Follow-up Information    Hamrick, Durward FortesMaura L, MD Follow up.   Specialty:  Family Medicine Contact information: [address] [place] [id number] [phone]          No Known Allergies  Consultations:  Nephrology   Procedures/Studies: Ct Abdomen Pelvis Wo Contrast  Result Date: 01/25/2018 CLINICAL DATA:  Abdominal pain and diarrhea for several weeks EXAM: CT ABDOMEN AND PELVIS WITHOUT CONTRAST TECHNIQUE: Multidetector CT imaging of the abdomen and pelvis was performed following the standard protocol without IV contrast. COMPARISON:  None. FINDINGS: Lower chest: Small bilateral pleural effusions are noted with mild bibasilar atelectasis. This is new from the prior exam. Hepatobiliary: The liver and gallbladder appear within normal limits. Pancreas: Unremarkable. No pancreatic ductal dilatation or surrounding inflammatory changes. Spleen: Normal in size without focal abnormality. Adrenals/Urinary Tract: Adrenal glands are  unremarkable. The kidneys are small and shrunken consistent with end-stage renal disease. The bladder is decompressed by Foley catheter. Stomach/Bowel: No obstructive or inflammatory changes of the bowel are seen. The appendix appears within normal limits. Vascular/Lymphatic: Aortic atherosclerosis. No enlarged abdominal or pelvic lymph nodes. Reproductive: Prostate is unremarkable. Other: Mild diffuse ascites is identified. No large sizable pocket to allow for safe paracentesis is seen. Musculoskeletal: Degenerative changes of the lumbar spine are seen. No acute bony abnormality is noted. IMPRESSION: Small bilateral pleural effusions and bibasilar atelectasis. Mild ascites. Changes consistent with chronic renal disease. Electronically Signed   By: Alcide CleverMark  Lukens M.D.   On: 01/25/2018 18:56   Dg Chest 2 View  Result Date: 01/25/2018 CLINICAL DATA:  Shortness of breath and hypoxemia EXAM: CHEST - 2 VIEW COMPARISON:  Chest radiograph 10/12/2017 FINDINGS: Cardiomegaly with calcific aortic atherosclerosis, unchanged. No pulmonary edema or focal airspace consolidation. No pneumothorax or sizable pleural effusion. Basilar opacities, likely atelectasis, unchanged. IMPRESSION: Cardiomegaly and calcific aortic atherosclerosis (ICD10-I70.0). Unchanged basilar opacities, likely atelectasis. No pulmonary edema. Electronically Signed   By: Deatra RobinsonKevin  Herman M.D.   On: 01/25/2018 15:43    (Echo, Carotid, EGD, Colonoscopy, ERCP)    Subjective:   Discharge  Exam: Vitals:   01/28/18 2123 01/29/18 0819  BP: 113/85   Pulse: 95   Resp: 16   Temp: 98.8 F (37.1 C)   SpO2: 100% 95%   Vitals:   01/28/18 1535 01/28/18 2032 01/28/18 2123 01/29/18 0819  BP: 117/85  113/85   Pulse: 84  95   Resp: 20  16   Temp: 97.9 F (36.6 C)  98.8 F (37.1 C)   TempSrc:   Axillary   SpO2: (!) 78% 94% 100% 95%  Weight:      Height:        General: Pt is alert, awake, not in acute distress Cardiovascular: RRR, S1/S2 +, no rubs,  no gallops Respiratory: CTA bilaterally, no wheezing, no rhonchi Abdominal: Soft, NT, ND, bowel sounds + Extremities: no edema, no cyanosis    The results of significant diagnostics from this hospitalization (including imaging, microbiology, ancillary and laboratory) are listed below for reference.     Microbiology: Recent Results (from the past 240 hour(s))  MRSA PCR Screening     Status: None   Collection Time: 01/25/18 10:13 PM  Result Value Ref Range Status   MRSA by PCR NEGATIVE NEGATIVE Final    Comment:        The GeneXpert MRSA Assay (FDA approved for NASAL specimens only), is one component of a comprehensive MRSA colonization surveillance program. It is not intended to diagnose MRSA infection nor to guide or monitor treatment for MRSA infections. Performed at [facility], [address] [place], [place] [address]      Labs: BNP (last 3 results) Recent Labs    04/23/17 0921 10/12/17 1128  BNP 2,928.7* >4,500.0*   Basic Metabolic Panel: Recent Labs  Lab 01/25/18 0953 01/26/18 0714 01/28/18 0835  NA 144 140 134*  K 4.9 6.3* 5.4*  CL 100* 100* 95*  CO2 25 20* 23  GLUCOSE 75 113* 122*  BUN 64* 68* 61*  CREATININE 13.56* 13.89* 10.20*  CALCIUM 9.0 8.6* 9.4  PHOS  --   --  8.3*   Liver Function Tests: Recent Labs  Lab 01/25/18 0953 01/28/18 0835  AST 15  --   ALT 8*  --   ALKPHOS 74  --   BILITOT 1.5*  --   PROT 7.1  --   ALBUMIN 3.7 3.3*   Recent Labs  Lab 01/25/18 0953  LIPASE 53*   No results for input(s): AMMONIA in the last 168 hours. CBC: Recent Labs  Lab 01/25/18 0953 01/26/18 0714 01/28/18 0835  WBC 5.3 7.1 6.6  HGB 10.9* 11.4* 10.2*  HCT 34.5* 36.1* 30.4*  MCV 102.4* 101.4* 98.7  PLT 104* 102* 123*   Cardiac Enzymes: No results for input(s): CKTOTAL, CKMB, CKMBINDEX, TROPONINI in the last 168 hours. BNP: Invalid input(s): POCBNP CBG: Recent Labs  Lab 01/26/18 2314  GLUCAP 103*   D-Dimer No results for  input(s): DDIMER in the last 72 hours. Hgb A1c No results for input(s): HGBA1C in the last 72 hours. Lipid Profile No results for input(s): CHOL, HDL, LDLCALC, TRIG, CHOLHDL, LDLDIRECT in the last 72 hours. Thyroid function studies No results for input(s): TSH, T4TOTAL, T3FREE, THYROIDAB in the last 72 hours.  Invalid input(s): FREET3 Anemia work up No results for input(s): VITAMINB12, FOLATE, FERRITIN, TIBC, IRON, RETICCTPCT in the last 72 hours. Urinalysis    Component Value Date/Time   COLORURINE AMBER (A) 01/25/2018 1540   APPEARANCEUR CLEAR 01/25/2018 1540   LABSPEC 1.014 01/25/2018 1540   PHURINE 7.0  01/25/2018 1540   GLUCOSEU 50 (A) 01/25/2018 1540   HGBUR NEGATIVE 01/25/2018 1540   BILIRUBINUR NEGATIVE 01/25/2018 1540   KETONESUR NEGATIVE 01/25/2018 1540   PROTEINUR 100 (A) 01/25/2018 1540   UROBILINOGEN 0.2 08/01/2015 1430   NITRITE NEGATIVE 01/25/2018 1540   LEUKOCYTESUR NEGATIVE 01/25/2018 1540   Sepsis Labs Invalid input(s): PROCALCITONIN,  WBC,  LACTICIDVEN Microbiology Recent Results (from the past 240 hour(s))  MRSA PCR Screening     Status: None   Collection Time: 01/25/18 10:13 PM  Result Value Ref Range Status   MRSA by PCR NEGATIVE NEGATIVE Final    Comment:        The GeneXpert MRSA Assay (FDA approved for NASAL specimens only), is one component of a comprehensive MRSA colonization surveillance program. It is not intended to diagnose MRSA infection nor to guide or monitor treatment for MRSA infections. Performed at [facility], [address] [place], [place] [address]      Time coordinating discharge: 35 min SIGNED:   Alwyn Ren, MD  [facility] 01/29/2018, 10:40 AM Pager   If 7PM-7AM, please contact night-coverage [email or web address] Password [email or web address]

## 2018-01-29 NOTE — Progress Notes (Signed)
Patient was very agitated, uncooperative and combative this morning notified the MD and given Ativan 0.5 mg im. Call the security and give some education about safety. Informed patient's daughter about his activity.

## 2018-01-29 NOTE — Progress Notes (Signed)
Patient was discharged home by MD order; discharged instructions review and give to patient and his son with care notes; IV DIC;  patient will be escorted to the car by RN via wheelchair.

## 2018-01-29 NOTE — Evaluation (Signed)
Physical Therapy Evaluation Patient Details Name: Shane [name] MRN: [id number] DOB: 1940-12-08 Today's Date: 01/29/2018   History of Present Illness  77yo male presenting with c/o diarrhea for the past 4 weeks. He is on HD but had not been to his sessions at time of admission. PMH hx AL amyloid neprhopathy, OA, hernia, CKD, underlying dementia, ESRD on HD, Afib, amyloidosis   Clinical Impression   Patient received in bed with family present, who assisted in providing PLOF and equipment history. Of note, they report that he has had multiple recent falls and that while he tends to be more mobile at the beginning of the week, often by the end of the week he is in a wheelchair due to fatigue from dialysis. He is able to complete functional bed mobility with Min guard, however requires MinA and Mod-Max verbal/tactile cues for functional transfers; able to ambulate approximately 65f with RW and ModA for balance and safe use of assistive device, Mod verbal and visual cues for navigation and safety in hallway. He was left in bed with all needs met, alarm activated, and family present. He will continue to benefit from skilled PT services in the acute setting, as well as ST-SNF to further address functional deficits moving forward; of note, [facility]     Follow Up Recommendations SNF;Other (comment)([facility]    Equipment Recommendations  None recommended by PT(appears to have all necessary DME )    Recommendations for Other Services       Precautions / Restrictions Precautions Precautions: Fall;Other (comment) Precaution Comments: dementia with some aggression/combativeness  Restrictions Weight Bearing Restrictions: No      Mobility  Bed Mobility Overal bed mobility: Needs Assistance Bed Mobility: Supine to Sit;Sit to Supine     Supine to sit: Min guard Sit to supine: Min guard   General bed mobility comments:  Min guard for safety, extended time   Transfers Overall transfer level: Needs assistance Equipment used: Rolling walker (2 wheeled) Transfers: Sit to/from Stand Sit to Stand: Min assist         General transfer comment: MinA to assist with balance/steadying and safety, VC for hand placement   Ambulation/Gait Ambulation/Gait assistance: Mod assist Ambulation Distance (Feet): 70 Feet Assistive device: Rolling walker (2 wheeled) Gait Pattern/deviations: Step-through pattern;Decreased step length - right;Decreased step length - left;Decreased stride length;Drifts right/left;Narrow base of support;Trunk flexed     General Gait Details: daughter reports he has callouses that make it painful for him to walk at baseline; patient unsteady with RW and with unsafe use of RW requiring ModA for safety and correct use of device, also to maintain balance. Visual cues provided for navigation.   Stairs            Wheelchair Mobility    Modified Rankin (Stroke Patients Only)       Balance Overall balance assessment: Needs assistance;History of Falls Sitting-balance support: Bilateral upper extremity supported;Feet supported Sitting balance-Leahy Scale: Fair     Standing balance support: Bilateral upper extremity supported;During functional activity Standing balance-Leahy Scale: Poor Standing balance comment: unsteady and impulsive, poor safety awareness; ModA to maintain safety and upright                              Pertinent Vitals/Pain Pain Assessment: No/denies pain    Home Living Family/patient expects to be discharged to:: Private residence Living Arrangements: Spouse/significant other;Children Available  Help at Discharge: Personal care attendant;Available PRN/intermittently Type of Home: House Home Access: Ramped entrance     Home Layout: One level Home Equipment: Walker - 2 wheels;Cane - single point;Bedside commode;Shower seat;Wheelchair -  manual Additional Comments: per daughter, his wife is WC bound and patient himself fluctuates during the week; he can move better at the beginning of the week but by the end of the week due to fatigue from dialysis is in a wheelchair himself.     Prior Function Level of Independence: Independent with assistive device(s);Needs assistance   Gait / Transfers Assistance Needed: uses cane and walker      Comments: Patient does not drive.  His daughter provides transportation.     Hand Dominance   Dominant Hand: Right    Extremity/Trunk Assessment        Lower Extremity Assessment Lower Extremity Assessment: Generalized weakness    Cervical / Trunk Assessment Cervical / Trunk Assessment: Kyphotic  Communication   Communication: No difficulties  Cognition Arousal/Alertness: Awake/alert Behavior During Therapy: Flat affect;Impulsive Overall Cognitive Status: History of cognitive impairments - at baseline                                 General Comments: impulsive with poor safety awareness      General Comments      Exercises     Assessment/Plan    PT Assessment Patient needs continued PT services  PT Problem List Decreased strength;Decreased mobility;Decreased safety awareness;Decreased coordination;Decreased balance       PT Treatment Interventions DME instruction;Therapeutic activities;Gait training;Therapeutic exercise;Patient/family education;Stair training;Balance training;Functional mobility training;Neuromuscular re-education    PT Goals (Current goals can be found in the Care Plan section)  Acute Rehab PT Goals Patient Stated Goal: to go home  PT Goal Formulation: With patient Time For Goal Achievement: 02/12/18 Potential to Achieve Goals: Fair    Frequency Min 3X/week   Barriers to discharge        Co-evaluation               AM-PAC PT "6 Clicks" Daily Activity  Outcome Measure Difficulty turning over in bed (including  adjusting bedclothes, sheets and blankets)?: Unable Difficulty moving from lying on back to sitting on the side of the bed? : Unable Difficulty sitting down on and standing up from a chair with arms (e.g., wheelchair, bedside commode, etc,.)?: Unable Help needed moving to and from a bed to chair (including a wheelchair)?: A Lot Help needed walking in hospital room?: A Lot Help needed climbing 3-5 steps with a railing? : Total 6 Click Score: 8    End of Session Equipment Utilized During Treatment: Gait belt Activity Tolerance: Patient tolerated treatment well Patient left: in bed;with bed alarm set;with call bell/phone within reach;with family/visitor present Nurse Communication: Mobility status PT Visit Diagnosis: Unsteadiness on feet (R26.81);Muscle weakness (generalized) (M62.81);History of falling (Z91.81);Difficulty in walking, not elsewhere classified (R26.2)    Time: 2423-5361 PT Time Calculation (min) (ACUTE ONLY): 19 min   Charges:   PT Evaluation $PT Eval Moderate Complexity: 1 Mod     PT G Codes:        Deniece Ree PT, DPT, CBIS  Supplemental Physical Therapist [place]   Pager [phone]

## 2018-01-29 NOTE — Progress Notes (Signed)
Last BM was on 01/27/2018. Per protocol, enteric isolation was D/C. Will continue to monitor.

## 2018-02-11 DIAGNOSIS — R52 Pain, unspecified: Secondary | ICD-10-CM | POA: Diagnosis not present

## 2018-02-11 DIAGNOSIS — N2581 Secondary hyperparathyroidism of renal origin: Secondary | ICD-10-CM | POA: Diagnosis not present

## 2018-02-11 DIAGNOSIS — R197 Diarrhea, unspecified: Secondary | ICD-10-CM | POA: Diagnosis not present

## 2018-02-11 DIAGNOSIS — N186 End stage renal disease: Secondary | ICD-10-CM | POA: Diagnosis not present

## 2018-02-11 DIAGNOSIS — D631 Anemia in chronic kidney disease: Secondary | ICD-10-CM | POA: Diagnosis not present

## 2018-02-13 DIAGNOSIS — R52 Pain, unspecified: Secondary | ICD-10-CM | POA: Diagnosis not present

## 2018-02-13 DIAGNOSIS — D631 Anemia in chronic kidney disease: Secondary | ICD-10-CM | POA: Diagnosis not present

## 2018-02-13 DIAGNOSIS — N186 End stage renal disease: Secondary | ICD-10-CM | POA: Diagnosis not present

## 2018-02-13 DIAGNOSIS — R197 Diarrhea, unspecified: Secondary | ICD-10-CM | POA: Diagnosis not present

## 2018-02-13 DIAGNOSIS — N2581 Secondary hyperparathyroidism of renal origin: Secondary | ICD-10-CM | POA: Diagnosis not present

## 2018-02-15 DIAGNOSIS — R52 Pain, unspecified: Secondary | ICD-10-CM | POA: Diagnosis not present

## 2018-02-15 DIAGNOSIS — D631 Anemia in chronic kidney disease: Secondary | ICD-10-CM | POA: Diagnosis not present

## 2018-02-15 DIAGNOSIS — N2581 Secondary hyperparathyroidism of renal origin: Secondary | ICD-10-CM | POA: Diagnosis not present

## 2018-02-15 DIAGNOSIS — R197 Diarrhea, unspecified: Secondary | ICD-10-CM | POA: Diagnosis not present

## 2018-02-15 DIAGNOSIS — N186 End stage renal disease: Secondary | ICD-10-CM | POA: Diagnosis not present

## 2018-02-18 DIAGNOSIS — R197 Diarrhea, unspecified: Secondary | ICD-10-CM | POA: Diagnosis not present

## 2018-02-18 DIAGNOSIS — N186 End stage renal disease: Secondary | ICD-10-CM | POA: Diagnosis not present

## 2018-02-18 DIAGNOSIS — D631 Anemia in chronic kidney disease: Secondary | ICD-10-CM | POA: Diagnosis not present

## 2018-02-18 DIAGNOSIS — R52 Pain, unspecified: Secondary | ICD-10-CM | POA: Diagnosis not present

## 2018-02-18 DIAGNOSIS — N2581 Secondary hyperparathyroidism of renal origin: Secondary | ICD-10-CM | POA: Diagnosis not present

## 2018-02-20 ENCOUNTER — Emergency Department (HOSPITAL_COMMUNITY): Payer: Medicare Other

## 2018-02-20 ENCOUNTER — Other Ambulatory Visit: Payer: Self-pay

## 2018-02-20 ENCOUNTER — Emergency Department (HOSPITAL_COMMUNITY)
Admission: EM | Admit: 2018-02-20 | Discharge: 2018-02-20 | Disposition: A | Discharge status: Home / Self Care | Payer: Medicare Other | Source: Home / Self Care | Attending: Emergency Medicine | Admitting: Emergency Medicine

## 2018-02-20 DIAGNOSIS — R0600 Dyspnea, unspecified: Secondary | ICD-10-CM | POA: Insufficient documentation

## 2018-02-20 DIAGNOSIS — R062 Wheezing: Secondary | ICD-10-CM | POA: Diagnosis not present

## 2018-02-20 DIAGNOSIS — R197 Diarrhea, unspecified: Secondary | ICD-10-CM | POA: Diagnosis not present

## 2018-02-20 DIAGNOSIS — Z515 Encounter for palliative care: Secondary | ICD-10-CM | POA: Diagnosis not present

## 2018-02-20 DIAGNOSIS — N08 Glomerular disorders in diseases classified elsewhere: Secondary | ICD-10-CM | POA: Diagnosis not present

## 2018-02-20 DIAGNOSIS — F17210 Nicotine dependence, cigarettes, uncomplicated: Secondary | ICD-10-CM | POA: Insufficient documentation

## 2018-02-20 DIAGNOSIS — J449 Chronic obstructive pulmonary disease, unspecified: Secondary | ICD-10-CM | POA: Diagnosis not present

## 2018-02-20 DIAGNOSIS — Z66 Do not resuscitate: Secondary | ICD-10-CM | POA: Diagnosis not present

## 2018-02-20 DIAGNOSIS — E875 Hyperkalemia: Secondary | ICD-10-CM | POA: Diagnosis not present

## 2018-02-20 DIAGNOSIS — D631 Anemia in chronic kidney disease: Secondary | ICD-10-CM | POA: Diagnosis not present

## 2018-02-20 DIAGNOSIS — R1310 Dysphagia, unspecified: Secondary | ICD-10-CM | POA: Diagnosis not present

## 2018-02-20 DIAGNOSIS — J811 Chronic pulmonary edema: Secondary | ICD-10-CM | POA: Diagnosis not present

## 2018-02-20 DIAGNOSIS — R0902 Hypoxemia: Secondary | ICD-10-CM | POA: Diagnosis not present

## 2018-02-20 DIAGNOSIS — E871 Hypo-osmolality and hyponatremia: Secondary | ICD-10-CM | POA: Diagnosis not present

## 2018-02-20 DIAGNOSIS — J181 Lobar pneumonia, unspecified organism: Secondary | ICD-10-CM | POA: Diagnosis not present

## 2018-02-20 DIAGNOSIS — J9601 Acute respiratory failure with hypoxia: Secondary | ICD-10-CM | POA: Diagnosis not present

## 2018-02-20 DIAGNOSIS — N2581 Secondary hyperparathyroidism of renal origin: Secondary | ICD-10-CM | POA: Diagnosis not present

## 2018-02-20 DIAGNOSIS — R52 Pain, unspecified: Secondary | ICD-10-CM | POA: Diagnosis not present

## 2018-02-20 DIAGNOSIS — R0602 Shortness of breath: Secondary | ICD-10-CM | POA: Diagnosis not present

## 2018-02-20 DIAGNOSIS — Z992 Dependence on renal dialysis: Secondary | ICD-10-CM | POA: Diagnosis not present

## 2018-02-20 DIAGNOSIS — B955 Unspecified streptococcus as the cause of diseases classified elsewhere: Secondary | ICD-10-CM | POA: Diagnosis not present

## 2018-02-20 DIAGNOSIS — Z79899 Other long term (current) drug therapy: Secondary | ICD-10-CM | POA: Insufficient documentation

## 2018-02-20 DIAGNOSIS — E850 Non-neuropathic heredofamilial amyloidosis: Secondary | ICD-10-CM | POA: Diagnosis not present

## 2018-02-20 DIAGNOSIS — G9349 Other encephalopathy: Secondary | ICD-10-CM | POA: Diagnosis not present

## 2018-02-20 DIAGNOSIS — N186 End stage renal disease: Secondary | ICD-10-CM | POA: Insufficient documentation

## 2018-02-20 DIAGNOSIS — K219 Gastro-esophageal reflux disease without esophagitis: Secondary | ICD-10-CM | POA: Diagnosis not present

## 2018-02-20 DIAGNOSIS — I120 Hypertensive chronic kidney disease with stage 5 chronic kidney disease or end stage renal disease: Secondary | ICD-10-CM | POA: Insufficient documentation

## 2018-02-20 DIAGNOSIS — R7881 Bacteremia: Secondary | ICD-10-CM | POA: Diagnosis not present

## 2018-02-20 DIAGNOSIS — R4182 Altered mental status, unspecified: Secondary | ICD-10-CM | POA: Diagnosis not present

## 2018-02-20 DIAGNOSIS — E8770 Fluid overload, unspecified: Secondary | ICD-10-CM | POA: Diagnosis not present

## 2018-02-20 DIAGNOSIS — I482 Chronic atrial fibrillation: Secondary | ICD-10-CM | POA: Diagnosis not present

## 2018-02-20 DIAGNOSIS — E162 Hypoglycemia, unspecified: Secondary | ICD-10-CM | POA: Diagnosis not present

## 2018-02-20 LAB — I-STAT CHEM 8, ED
BUN: 41 mg/dL — ABNORMAL HIGH (ref 6–20)
CHLORIDE: 101 mmol/L (ref 101–111)
CREATININE: 12.1 mg/dL — AB (ref 0.61–1.24)
Calcium, Ion: 1.01 mmol/L — ABNORMAL LOW (ref 1.15–1.40)
GLUCOSE: 103 mg/dL — AB (ref 65–99)
HEMATOCRIT: 30 % — AB (ref 39.0–52.0)
Hemoglobin: 10.2 g/dL — ABNORMAL LOW (ref 13.0–17.0)
Potassium: 5.1 mmol/L (ref 3.5–5.1)
SODIUM: 141 mmol/L (ref 135–145)
TCO2: 26 mmol/L (ref 22–32)

## 2018-02-20 LAB — CBC WITH DIFFERENTIAL/PLATELET
Abs Immature Granulocytes: 0 10*3/uL (ref 0.0–0.1)
Basophils Absolute: 0 10*3/uL (ref 0.0–0.1)
Basophils Relative: 0 %
EOS ABS: 0.1 10*3/uL (ref 0.0–0.7)
Eosinophils Relative: 2 %
HEMATOCRIT: 30.6 % — AB (ref 39.0–52.0)
Hemoglobin: 9.5 g/dL — ABNORMAL LOW (ref 13.0–17.0)
Immature Granulocytes: 0 %
LYMPHS ABS: 0.4 10*3/uL — AB (ref 0.7–4.0)
LYMPHS PCT: 9 %
MCH: 32.6 pg (ref 26.0–34.0)
MCHC: 31 g/dL (ref 30.0–36.0)
MCV: 105.2 fL — ABNORMAL HIGH (ref 78.0–100.0)
Monocytes Absolute: 0.7 10*3/uL (ref 0.1–1.0)
Monocytes Relative: 15 %
NEUTROS PCT: 74 %
Neutro Abs: 3.6 10*3/uL (ref 1.7–7.7)
Platelets: 102 10*3/uL — ABNORMAL LOW (ref 150–400)
RBC: 2.91 MIL/uL — AB (ref 4.22–5.81)
RDW: 14.6 % (ref 11.5–15.5)
WBC: 4.8 10*3/uL (ref 4.0–10.5)

## 2018-02-20 LAB — I-STAT TROPONIN, ED: TROPONIN I, POC: 0.08 ng/mL (ref 0.00–0.08)

## 2018-02-20 MED ORDER — ALBUTEROL SULFATE HFA 108 (90 BASE) MCG/ACT IN AERS
2.0000 | INHALATION_SPRAY | RESPIRATORY_TRACT | Status: DC | PRN
  Filled 2018-02-20: qty 6.7

## 2018-02-20 MED ORDER — ALBUTEROL SULFATE (2.5 MG/3ML) 0.083% IN NEBU
5.0000 mg | INHALATION_SOLUTION | Freq: Once | RESPIRATORY_TRACT | Status: AC
  Administered 2018-02-20: 5 mg via RESPIRATORY_TRACT
  Filled 2018-02-20: qty 6

## 2018-02-20 NOTE — ED Provider Notes (Signed)
[facility] EMERGENCY DEPARTMENT Provider Note   CSN: [id number] Arrival date & time: 02/20/18  0946     History   Chief Complaint Chief Complaint  Patient presents with  . Shortness of Breath    HPI Shane [name] is a 77 y.o. male.  HPI Patient presents with shortness of breath.  States began this morning.  Has had wheezing.  No real cough.  Does have history of wheezing.  States feeling somewhat better now after albuterol fourth EMS.  He is a dialysis patient is scheduled for dialysis today.  No chest pain.  No fevers.  States he thinks he may have COPD but is never been diagnosed with it.  He is a current smoker.  No chest pain.  No swelling in his legs. Past Medical History:  Diagnosis Date  . 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 Arthritis    bursitis in hip  . Bilateral inguinal hernia (BIH)    right > left side  . 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 Reynolds Bowl  . Elevated troponin 04/12/2015  . Full dentures   . Hypertension   . Tobacco abuse   . Urinary tract infection due to Enterococcus 08/03/2015  . Varicocele     Patient Active Problem List   Diagnosis Date Noted  . GERD (gastroesophageal reflux disease) 01/25/2018  . Diarrhea 01/25/2018  . Abdominal pain 01/25/2018  . Acute respiratory failure with hypoxia (HCC) 01/25/2018  . Thrombocytopenia (HCC) 01/25/2018  . Atrial fibrillation, chronic (HCC)   . Atrial fibrillation (HCC) 06/01/2017  . A-fib (HCC) 06/01/2017  . Atrial fibrillation with RVR (HCC) 06/29/2016  . Adjustment disorder with other symptom   . Toxic metabolic encephalopathy 12/01/2015  . Bilateral inguinal hernia (BIH) s/p lap repair w mesh 11/25/2015 11/25/2015  . Hydrocele s/p partial rexction/unroofing 11/25/2015 11/25/2015  . 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 Pulmonary hypertension  (HCC) 08/04/2015  . Essential hypertension   . Acute encephalopathy 04/12/2015  . ESRD on dialysis (HCC) 04/12/2015  . Anorexia 04/12/2015  . Amyloidosis (HCC) 04/12/2015  . Uremia 04/12/2015  . Tobacco abuse 02/09/2013  . Hypertension 02/09/2013    Past Surgical History:  Procedure Laterality Date  . AV FISTULA PLACEMENT, BRACHIOCEPHALIC  10/19/2010   LEFT  . AV FISTULA PLACEMENT, RADIOCEPHALIC  01/30/2011   Right w/ ligation of competing venous branch by Dr. Leonides Sake  . AV FISTULA PLACEMENT, RADIOCEPHALIC  09/13/2010   LEFT  . BASCILIC VEIN TRANSPOSITION Left 04/08/2015   Procedure: BASILIC VEIN TRANSPOSITION;  Surgeon: Pryor Ochoa, MD;  Location: [facility] OR;  Service: Vascular;  Laterality: Left;  . COLONOSCOPY    . INGUINAL HERNIA REPAIR Bilateral 11/25/2015   Procedure: LAPAROSCOPIC BILATERAL INGUINAL HERNIA REPAIR--EXCISION OF RIGHT HYDROCELE;  Surgeon: Karie Soda, MD;  Location: [facility] OR;  Service: General;  Laterality: Bilateral;  . INSERTION OF MESH Bilateral 11/25/2015   Procedure: INSERTION OF MESH;  Surgeon: Karie Soda, MD;  Location: [facility] OR;  Service: General;  Laterality: Bilateral;  . MULTIPLE TOOTH EXTRACTIONS          Home Medications    Prior to Admission medications   Medication Sig Start Date End Date Taking? Authorizing Provider  ammonium lactate (AMLACTIN) 12 % cream Apply topically daily. 01/30/18   Alwyn Ren, MD  AURYXIA 1 GM 210 MG(Fe) tablet Take  420 mg by mouth 3 (three) times daily with meals.  01/08/18   [provider]  calcitRIOL (ROCALTROL) 0.5 MCG capsule Take 2 capsules (1 mcg total) by mouth every Monday, Wednesday, and Friday with hemodialysis. 01/30/18   Alwyn Ren, MD  calcium acetate (PHOSLO) 667 MG capsule Take 3 capsules (2,001 mg total) by mouth 3 (three) times daily with meals. 01/29/18   Alwyn Ren, MD  dextromethorphan-guaiFENesin [facility] DM) 30-600 MG 12hr tablet Take 1 tablet by mouth 2 (two) times  daily as needed for cough. 01/29/18   Alwyn Ren, MD  esomeprazole (NEXIUM) 20 MG capsule Take 20 mg by mouth daily before breakfast. 01/07/18   [provider]  metoprolol succinate (TOPROL-XL) 25 MG 24 hr tablet Take 0.5 tablets (12.5 mg total) by mouth daily. 01/30/18   Alwyn Ren, MD  nicotine (NICODERM CQ - DOSED IN MG/24 HOURS) 21 mg/24hr patch Place 1 patch (21 mg total) onto the skin daily. 01/30/18   Alwyn Ren, MD  triamcinolone cream (KENALOG) 0.1 % Apply topically daily. 01/30/18   Alwyn Ren, MD    Family History Family History  Problem Relation Age of Onset  . Asthma Mother   . Hypertension Mother   . Alcohol abuse Father   . Hypertension Father   . Hypertension Sister   . Diabetes Sister   . Heart disease Sister   . Hypertension Brother   . Peripheral vascular disease Brother   . Hypertension Daughter   . Heart disease Daughter     Social History Social History   Tobacco Use  . Smoking status: Current Every Day Smoker    Packs/day: 1.00    Years: 50.00    Pack years: 50.00    Types: Cigarettes  . Smokeless tobacco: Never Used  Substance Use Topics  . Alcohol use: No    Alcohol/week: 0.0 oz  . Drug use: No     Allergies   Patient has no known allergies.   Review of Systems Review of Systems  Constitutional: Negative for fatigue and fever.  HENT: Negative for congestion.   Respiratory: Positive for shortness of breath and wheezing.   Cardiovascular: Negative for chest pain.  Gastrointestinal: Negative for abdominal pain.  Genitourinary: Negative for flank pain.  Musculoskeletal: Negative for back pain.  Skin: Negative for rash.  Neurological: Negative for weakness.  Psychiatric/Behavioral: Negative for confusion.     Physical Exam Updated Vital Signs BP (!) 141/87 (BP Location: Right Arm)   Pulse 76   Temp 97.8 F (36.6 C) (Oral)   Resp (!) 24   Ht  (1.651 m)   Wt 63.5 kg (140 lb)   SpO2  100%   BMI 23.30 kg/m   Physical Exam  Constitutional: He appears well-developed.  HENT:  Head: Normocephalic.  Neck: Neck supple.  Cardiovascular: Normal rate.  Pulmonary/Chest: Effort normal.  Mild expiratory wheezes.  No focal rales or rhonchi.  Abdominal: Soft.  Musculoskeletal:  Dialysis graft to left upper arm.  Good pulse and thrill.Mild edema bilateral lower extremities  Neurological: He is alert.  Skin: Skin is warm.     ED Treatments / Results  Labs (all labs ordered are listed, but only abnormal results are displayed) Labs Reviewed  CBC WITH DIFFERENTIAL/PLATELET - Abnormal; Notable for the following components:      Result Value   RBC 2.91 (*)    Hemoglobin 9.5 (*)    HCT 30.6 (*)    MCV 105.2 (*)  Platelets 102 (*)    Lymphs Abs 0.4 (*)    All other components within normal limits  I-STAT CHEM 8, ED - Abnormal; Notable for the following components:   BUN 41 (*)    Creatinine, Ser 12.10 (*)    Glucose, Bld 103 (*)    Calcium, Ion 1.01 (*)    Hemoglobin 10.2 (*)    HCT 30.0 (*)    All other components within normal limits  I-STAT TROPONIN, ED    EKG EKG Interpretation  Date/Time:  Wednesday Feb 20 2018 10:52:40 EDT Ventricular Rate:  95 PR Interval:    QRS Duration: 141 QT Interval:  398 QTC Calculation: 501 R Axis:   -39 Text Interpretation:  Atrial fibrillation Right bundle branch block Inferior infarct, old Confirmed by Benjiman Core [phone]) on 02/20/2018 11:00:43 AM   Radiology Dg Chest 2 View  Result Date: 02/20/2018 CLINICAL DATA:  Shortness of breath EXAM: CHEST - 2 VIEW COMPARISON:  01/25/2018 FINDINGS: Cardiac shadow is mildly enlarged but stable. Aortic calcifications are again seen. The lungs are well aerated with mild left basilar atelectasis. No sizable effusion is noted. No bony abnormality is seen. IMPRESSION: Stable left basilar atelectasis. Electronically Signed   By: Alcide Clever M.D.   On: 02/20/2018 10:58     Procedures Procedures (including critical care time)  Medications Ordered in ED Medications  albuterol (PROVENTIL HFA;VENTOLIN HFA) 108 (90 Base) MCG/ACT inhaler 2 puff (has no administration in time range)  albuterol (PROVENTIL) (2.5 MG/3ML) 0.083% nebulizer solution 5 mg (5 mg Nebulization Given 02/20/18 1057)     Initial Impression / Assessment and Plan / ED Course  I have reviewed the triage vital signs and the nursing notes.  Pertinent labs & imaging results that were available during my care of the patient were reviewed by me and considered in my medical decision making (see chart for details).     Patient with shortness of breath.  Dialysis patient and also likely has COPD.  Does not appear overly volume overloaded.  Feels better after breathing treatment.  Will discharge home.  Final Clinical Impressions(s) / ED Diagnoses   Final diagnoses:  Dyspnea, unspecified type    ED Discharge Orders    None       Benjiman Core, MD 02/20/18 [phone]

## 2018-02-20 NOTE — ED Triage Notes (Signed)
Arrived to ED via EMS with c/o dyspnea which started this am; EMS reports was wheezing bilat throughout - was given Albuterol neb x1 with improvement; minimal wheezing noted at present; resp even, nonlabored; pt is scheduled for his routine dialysis at 1130 this am

## 2018-02-20 NOTE — ED Notes (Signed)
Pt given turkey sandwich and diet ginger ale.   

## 2018-02-20 NOTE — Discharge Instructions (Addendum)
Use the inhaler as needed for wheezing.

## 2018-02-20 NOTE — ED Notes (Signed)
Albuterol inhaler given to go - use discussed with pt who then showed me what I taught him

## 2018-02-22 DIAGNOSIS — R197 Diarrhea, unspecified: Secondary | ICD-10-CM | POA: Diagnosis not present

## 2018-02-22 DIAGNOSIS — N2581 Secondary hyperparathyroidism of renal origin: Secondary | ICD-10-CM | POA: Diagnosis not present

## 2018-02-22 DIAGNOSIS — D631 Anemia in chronic kidney disease: Secondary | ICD-10-CM | POA: Diagnosis not present

## 2018-02-22 DIAGNOSIS — N186 End stage renal disease: Secondary | ICD-10-CM | POA: Diagnosis not present

## 2018-02-22 DIAGNOSIS — R52 Pain, unspecified: Secondary | ICD-10-CM | POA: Diagnosis not present

## 2018-02-23 ENCOUNTER — Inpatient Hospital Stay (HOSPITAL_COMMUNITY)
Admission: EM | Admit: 2018-02-23 | Discharge: 2018-03-11 | DRG: 193 | Disposition: A | Discharge status: Hospice | Payer: Medicare Other | Attending: Internal Medicine | Admitting: Internal Medicine

## 2018-02-23 ENCOUNTER — Emergency Department (HOSPITAL_COMMUNITY): Payer: Medicare Other

## 2018-02-23 DIAGNOSIS — J9601 Acute respiratory failure with hypoxia: Secondary | ICD-10-CM | POA: Diagnosis not present

## 2018-02-23 DIAGNOSIS — E162 Hypoglycemia, unspecified: Secondary | ICD-10-CM

## 2018-02-23 DIAGNOSIS — I10 Essential (primary) hypertension: Secondary | ICD-10-CM | POA: Diagnosis present

## 2018-02-23 DIAGNOSIS — Z992 Dependence on renal dialysis: Secondary | ICD-10-CM | POA: Diagnosis not present

## 2018-02-23 DIAGNOSIS — F03918 Unspecified dementia, unspecified severity, with other behavioral disturbance: Secondary | ICD-10-CM

## 2018-02-23 DIAGNOSIS — R410 Disorientation, unspecified: Secondary | ICD-10-CM

## 2018-02-23 DIAGNOSIS — E875 Hyperkalemia: Secondary | ICD-10-CM | POA: Diagnosis not present

## 2018-02-23 DIAGNOSIS — Z515 Encounter for palliative care: Secondary | ICD-10-CM

## 2018-02-23 DIAGNOSIS — M1990 Unspecified osteoarthritis, unspecified site: Secondary | ICD-10-CM | POA: Diagnosis present

## 2018-02-23 DIAGNOSIS — Z7189 Other specified counseling: Secondary | ICD-10-CM | POA: Diagnosis not present

## 2018-02-23 DIAGNOSIS — I120 Hypertensive chronic kidney disease with stage 5 chronic kidney disease or end stage renal disease: Secondary | ICD-10-CM | POA: Diagnosis not present

## 2018-02-23 DIAGNOSIS — Z825 Family history of asthma and other chronic lower respiratory diseases: Secondary | ICD-10-CM

## 2018-02-23 DIAGNOSIS — F0391 Unspecified dementia with behavioral disturbance: Secondary | ICD-10-CM

## 2018-02-23 DIAGNOSIS — F05 Delirium due to known physiological condition: Secondary | ICD-10-CM | POA: Diagnosis present

## 2018-02-23 DIAGNOSIS — Z9115 Patient's noncompliance with renal dialysis: Secondary | ICD-10-CM

## 2018-02-23 DIAGNOSIS — R0902 Hypoxemia: Secondary | ICD-10-CM | POA: Diagnosis not present

## 2018-02-23 DIAGNOSIS — Z9119 Patient's noncompliance with other medical treatment and regimen: Secondary | ICD-10-CM

## 2018-02-23 DIAGNOSIS — J811 Chronic pulmonary edema: Secondary | ICD-10-CM | POA: Diagnosis present

## 2018-02-23 DIAGNOSIS — Z87440 Personal history of urinary (tract) infections: Secondary | ICD-10-CM

## 2018-02-23 DIAGNOSIS — F0151 Vascular dementia with behavioral disturbance: Secondary | ICD-10-CM | POA: Diagnosis present

## 2018-02-23 DIAGNOSIS — E859 Amyloidosis, unspecified: Secondary | ICD-10-CM | POA: Diagnosis not present

## 2018-02-23 DIAGNOSIS — K219 Gastro-esophageal reflux disease without esophagitis: Secondary | ICD-10-CM | POA: Diagnosis present

## 2018-02-23 DIAGNOSIS — J449 Chronic obstructive pulmonary disease, unspecified: Secondary | ICD-10-CM | POA: Diagnosis present

## 2018-02-23 DIAGNOSIS — J181 Lobar pneumonia, unspecified organism: Principal | ICD-10-CM | POA: Diagnosis present

## 2018-02-23 DIAGNOSIS — N08 Glomerular disorders in diseases classified elsewhere: Secondary | ICD-10-CM | POA: Diagnosis present

## 2018-02-23 DIAGNOSIS — I482 Chronic atrial fibrillation: Secondary | ICD-10-CM | POA: Diagnosis present

## 2018-02-23 DIAGNOSIS — M7070 Other bursitis of hip, unspecified hip: Secondary | ICD-10-CM | POA: Diagnosis present

## 2018-02-23 DIAGNOSIS — R0600 Dyspnea, unspecified: Secondary | ICD-10-CM

## 2018-02-23 DIAGNOSIS — R4702 Dysphasia: Secondary | ICD-10-CM | POA: Diagnosis present

## 2018-02-23 DIAGNOSIS — R7881 Bacteremia: Secondary | ICD-10-CM | POA: Diagnosis present

## 2018-02-23 DIAGNOSIS — E850 Non-neuropathic heredofamilial amyloidosis: Secondary | ICD-10-CM | POA: Diagnosis present

## 2018-02-23 DIAGNOSIS — N186 End stage renal disease: Secondary | ICD-10-CM | POA: Diagnosis not present

## 2018-02-23 DIAGNOSIS — Z66 Do not resuscitate: Secondary | ICD-10-CM | POA: Diagnosis not present

## 2018-02-23 DIAGNOSIS — R4182 Altered mental status, unspecified: Secondary | ICD-10-CM | POA: Diagnosis present

## 2018-02-23 DIAGNOSIS — R1310 Dysphagia, unspecified: Secondary | ICD-10-CM | POA: Diagnosis present

## 2018-02-23 DIAGNOSIS — B955 Unspecified streptococcus as the cause of diseases classified elsewhere: Secondary | ICD-10-CM | POA: Diagnosis not present

## 2018-02-23 DIAGNOSIS — G9349 Other encephalopathy: Secondary | ICD-10-CM | POA: Diagnosis present

## 2018-02-23 DIAGNOSIS — E8770 Fluid overload, unspecified: Secondary | ICD-10-CM | POA: Diagnosis not present

## 2018-02-23 DIAGNOSIS — Y95 Nosocomial condition: Secondary | ICD-10-CM | POA: Diagnosis present

## 2018-02-23 DIAGNOSIS — J189 Pneumonia, unspecified organism: Secondary | ICD-10-CM | POA: Diagnosis not present

## 2018-02-23 DIAGNOSIS — R0602 Shortness of breath: Secondary | ICD-10-CM | POA: Diagnosis not present

## 2018-02-23 DIAGNOSIS — F17210 Nicotine dependence, cigarettes, uncomplicated: Secondary | ICD-10-CM | POA: Diagnosis present

## 2018-02-23 DIAGNOSIS — E871 Hypo-osmolality and hyponatremia: Secondary | ICD-10-CM | POA: Diagnosis not present

## 2018-02-23 DIAGNOSIS — N2581 Secondary hyperparathyroidism of renal origin: Secondary | ICD-10-CM | POA: Diagnosis present

## 2018-02-23 DIAGNOSIS — Z833 Family history of diabetes mellitus: Secondary | ICD-10-CM

## 2018-02-23 DIAGNOSIS — R4020 Unspecified coma: Secondary | ICD-10-CM | POA: Diagnosis not present

## 2018-02-23 DIAGNOSIS — D631 Anemia in chronic kidney disease: Secondary | ICD-10-CM | POA: Diagnosis present

## 2018-02-23 DIAGNOSIS — Z9114 Patient's other noncompliance with medication regimen: Secondary | ICD-10-CM

## 2018-02-23 DIAGNOSIS — Z8249 Family history of ischemic heart disease and other diseases of the circulatory system: Secondary | ICD-10-CM

## 2018-02-23 DIAGNOSIS — Z79899 Other long term (current) drug therapy: Secondary | ICD-10-CM

## 2018-02-23 DIAGNOSIS — I4510 Unspecified right bundle-branch block: Secondary | ICD-10-CM | POA: Diagnosis present

## 2018-02-23 DIAGNOSIS — Z781 Physical restraint status: Secondary | ICD-10-CM

## 2018-02-23 DIAGNOSIS — R509 Fever, unspecified: Secondary | ICD-10-CM

## 2018-02-23 LAB — I-STAT VENOUS BLOOD GAS, ED
Bicarbonate: 26.2 mmol/L (ref 20.0–28.0)
O2 SAT: 24 %
TCO2: 28 mmol/L (ref 22–32)
pCO2, Ven: 49.6 mmHg (ref 44.0–60.0)
pH, Ven: 7.33 (ref 7.250–7.430)
pO2, Ven: 18 mmHg — CL (ref 32.0–45.0)

## 2018-02-23 LAB — BASIC METABOLIC PANEL WITH GFR
Anion gap: 24 — ABNORMAL HIGH (ref 5–15)
BUN: 60 mg/dL — ABNORMAL HIGH (ref 6–20)
CO2: 22 mmol/L (ref 22–32)
Calcium: 8.6 mg/dL — ABNORMAL LOW (ref 8.9–10.3)
Chloride: 98 mmol/L — ABNORMAL LOW (ref 101–111)
Creatinine, Ser: 14.45 mg/dL — ABNORMAL HIGH (ref 0.61–1.24)
GFR calc Af Amer: 3 mL/min — ABNORMAL LOW
GFR calc non Af Amer: 3 mL/min — ABNORMAL LOW
Glucose, Bld: 62 mg/dL — ABNORMAL LOW (ref 65–99)
Potassium: 6.7 mmol/L (ref 3.5–5.1)
Sodium: 144 mmol/L (ref 135–145)

## 2018-02-23 LAB — TROPONIN I: Troponin I: 0.28 ng/mL

## 2018-02-23 LAB — CBC
HCT: 32.6 % — ABNORMAL LOW (ref 39.0–52.0)
Hemoglobin: 10.2 g/dL — ABNORMAL LOW (ref 13.0–17.0)
MCH: 32.4 pg (ref 26.0–34.0)
MCHC: 31.3 g/dL (ref 30.0–36.0)
MCV: 103.5 fL — ABNORMAL HIGH (ref 78.0–100.0)
Platelets: 109 K/uL — ABNORMAL LOW (ref 150–400)
RBC: 3.15 MIL/uL — ABNORMAL LOW (ref 4.22–5.81)
RDW: 14.7 % (ref 11.5–15.5)
WBC: 5.6 K/uL (ref 4.0–10.5)

## 2018-02-23 LAB — BRAIN NATRIURETIC PEPTIDE: B Natriuretic Peptide: >4500 pg/mL — ABNORMAL HIGH (ref 0.0–100.0)

## 2018-02-23 MED ORDER — DILTIAZEM HCL-DEXTROSE 100-5 MG/100ML-% IV SOLN (PREMIX)
5.0000 mg/h | Freq: Once | INTRAVENOUS | Status: AC
  Administered 2018-02-23: 5 mg/h via INTRAVENOUS
  Filled 2018-02-23: qty 100

## 2018-02-23 MED ORDER — SODIUM CHLORIDE 0.9 % IV SOLN
1.0000 g | Freq: Once | INTRAVENOUS | Status: AC
  Administered 2018-02-23: 1 g via INTRAVENOUS
  Filled 2018-02-23: qty 10

## 2018-02-23 MED ORDER — SODIUM CHLORIDE 0.9 % IV SOLN: 100.0000 mL | INTRAVENOUS | Status: DC | PRN

## 2018-02-23 MED ORDER — LIDOCAINE-PRILOCAINE 2.5-2.5 % EX CREA
1.0000 "application " | TOPICAL_CREAM | CUTANEOUS | Status: DC | PRN
  Filled 2018-02-23: qty 5

## 2018-02-23 MED ORDER — DILTIAZEM HCL-DEXTROSE 100-5 MG/100ML-% IV SOLN (PREMIX): 5.0000 mg/h | Freq: Once | INTRAVENOUS | Status: DC

## 2018-02-23 MED ORDER — PENTAFLUOROPROP-TETRAFLUOROETH EX AERO
1.0000 "application " | INHALATION_SPRAY | CUTANEOUS | Status: DC | PRN
  Filled 2018-02-23: qty 30

## 2018-02-23 MED ORDER — IPRATROPIUM-ALBUTEROL 0.5-2.5 (3) MG/3ML IN SOLN
3.0000 mL | Freq: Once | RESPIRATORY_TRACT | Status: DC
Start: 2018-02-23 — End: 2018-02-24

## 2018-02-23 MED ORDER — DEXTROSE 50 % IV SOLN
1.0000 | Freq: Once | INTRAVENOUS | Status: AC
Start: 2018-02-23 — End: 2018-02-23
  Administered 2018-02-23: 50 mL via INTRAVENOUS
  Filled 2018-02-23: qty 50

## 2018-02-23 MED ORDER — INSULIN ASPART 100 UNIT/ML ~~LOC~~ SOLN
5.0000 [IU] | Freq: Once | SUBCUTANEOUS | Status: AC
Start: 2018-02-23 — End: 2018-02-23
  Administered 2018-02-23: 5 [IU] via INTRAVENOUS
  Filled 2018-02-23: qty 1

## 2018-02-23 NOTE — ED Triage Notes (Signed)
Pt is M,W,F dialysis. Has not been dialyzed since Monday. Hard to arouse in triage. Afib w/ RVR on the monitor.  Unable to obtain further history at this time.

## 2018-02-23 NOTE — ED Provider Notes (Signed)
[facility] EMERGENCY DEPARTMENT Provider Note   CSN: [id number] Arrival date & time: 02/23/18  2020     History   Chief Complaint Chief Complaint  Patient presents with  . Altered Mental Status    HPI Shane [name] is a 77 y.o. male.  HPI  Patient is a 77 year old male past medical history of ESRD, A. fib not currently on any anticoagulation, medication noncompliance and dementia who comes to the ED with respiratory distress.  Patient is normally dialyzed Monday Wednesday Friday, however patient did not go on Wednesday or Friday.  Patient has had worsening shortness of breath which became acutely worse this evening.  Patient is accompanied by his son.   Past Medical History:  Diagnosis Date  . 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 Arthritis    bursitis in hip  . Bilateral inguinal hernia (BIH)    right > left side  . 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 Reynolds Bowl  . Elevated troponin 04/12/2015  . Full dentures   . Hypertension   . Tobacco abuse   . Urinary tract infection due to Enterococcus 08/03/2015  . Varicocele     Patient Active Problem List   Diagnosis Date Noted  . GERD (gastroesophageal reflux disease) 01/25/2018  . Diarrhea 01/25/2018  . Abdominal pain 01/25/2018  . Acute respiratory failure with hypoxia (HCC) 01/25/2018  . Thrombocytopenia (HCC) 01/25/2018  . Atrial fibrillation, chronic (HCC)   . Atrial fibrillation (HCC) 06/01/2017  . A-fib (HCC) 06/01/2017  . Atrial fibrillation with RVR (HCC) 06/29/2016  . Adjustment disorder with other symptom   . Toxic metabolic encephalopathy 12/01/2015  . Bilateral inguinal hernia (BIH) s/p lap repair w mesh 11/25/2015 11/25/2015  . Hydrocele s/p partial rexction/unroofing 11/25/2015 11/25/2015  . 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 Pulmonary hypertension  (HCC) 08/04/2015  . Essential hypertension   . Acute encephalopathy 04/12/2015  . ESRD on dialysis (HCC) 04/12/2015  . Anorexia 04/12/2015  . Amyloidosis (HCC) 04/12/2015  . Uremia 04/12/2015  . Tobacco abuse 02/09/2013  . Hypertension 02/09/2013    Past Surgical History:  Procedure Laterality Date  . AV FISTULA PLACEMENT, BRACHIOCEPHALIC  10/19/2010   LEFT  . AV FISTULA PLACEMENT, RADIOCEPHALIC  01/30/2011   Right w/ ligation of competing venous branch by Dr. Leonides Sake  . AV FISTULA PLACEMENT, RADIOCEPHALIC  09/13/2010   LEFT  . BASCILIC VEIN TRANSPOSITION Left 04/08/2015   Procedure: BASILIC VEIN TRANSPOSITION;  Surgeon: Pryor Ochoa, MD;  Location: [facility] OR;  Service: Vascular;  Laterality: Left;  . COLONOSCOPY    . INGUINAL HERNIA REPAIR Bilateral 11/25/2015   Procedure: LAPAROSCOPIC BILATERAL INGUINAL HERNIA REPAIR--EXCISION OF RIGHT HYDROCELE;  Surgeon: Karie Soda, MD;  Location: [facility] OR;  Service: General;  Laterality: Bilateral;  . INSERTION OF MESH Bilateral 11/25/2015   Procedure: INSERTION OF MESH;  Surgeon: Karie Soda, MD;  Location: [facility] OR;  Service: General;  Laterality: Bilateral;  . MULTIPLE TOOTH EXTRACTIONS          Home Medications    Prior to Admission medications   Medication Sig Start Date End Date Taking? Authorizing Provider  ammonium lactate (AMLACTIN) 12 % cream Apply topically daily. 01/30/18  Yes Alwyn Ren, MD  AURYXIA 1 GM 210 MG(Fe) tablet Take 420 mg by mouth 3 (three) times daily with meals.  01/08/18  Yes  [provider]  calcitRIOL (ROCALTROL) 0.5 MCG capsule Take 2 capsules (1 mcg total) by mouth every Monday, Wednesday, and Friday with hemodialysis. 01/30/18  Yes Alwyn Ren, MD  calcium acetate (PHOSLO) 667 MG capsule Take 3 capsules (2,001 mg total) by mouth 3 (three) times daily with meals. 01/29/18  Yes Alwyn Ren, MD  esomeprazole (NEXIUM) 20 MG capsule Take 20 mg by mouth daily before breakfast. 01/07/18   Yes [provider]  metoprolol succinate (TOPROL-XL) 25 MG 24 hr tablet Take 0.5 tablets (12.5 mg total) by mouth daily. 01/30/18  Yes Alwyn Ren, MD  nicotine (NICODERM CQ - DOSED IN MG/24 HOURS) 21 mg/24hr patch Place 1 patch (21 mg total) onto the skin daily. 01/30/18  Yes Alwyn Ren, MD  triamcinolone cream (KENALOG) 0.1 % Apply topically daily. 01/30/18  Yes Alwyn Ren, MD  dextromethorphan-guaiFENesin [facility] DM) 30-600 MG 12hr tablet Take 1 tablet by mouth 2 (two) times daily as needed for cough. Patient not taking: Reported on 02/23/2018 01/29/18   Alwyn Ren, MD    Family History Family History  Problem Relation Age of Onset  . Asthma Mother   . Hypertension Mother   . Alcohol abuse Father   . Hypertension Father   . Hypertension Sister   . Diabetes Sister   . Heart disease Sister   . Hypertension Brother   . Peripheral vascular disease Brother   . Hypertension Daughter   . Heart disease Daughter     Social History Social History   Tobacco Use  . Smoking status: Current Every Day Smoker    Packs/day: 1.00    Years: 50.00    Pack years: 50.00    Types: Cigarettes  . Smokeless tobacco: Never Used  Substance Use Topics  . Alcohol use: No    Alcohol/week: 0.0 oz  . Drug use: No     Allergies   Patient has no known allergies.   Review of Systems Review of Systems  Constitutional: Negative for chills and fever.  Respiratory: Positive for shortness of breath.   Cardiovascular: Positive for leg swelling. Negative for chest pain.  Gastrointestinal: Negative for abdominal pain, nausea and vomiting.  All other systems reviewed and are negative.    Physical Exam Updated Vital Signs BP (!) 152/77 (BP Location: Right Arm)   Pulse 75   Temp 98.5 F (36.9 C) (Axillary)   Resp (!) 22   Wt 68.5 kg (151 lb 0.2 oz)   SpO2 92%   BMI 25.13 kg/m   Physical Exam  Constitutional: He appears well-developed and  well-nourished.  HENT:  Head: Normocephalic and atraumatic.  Eyes: Conjunctivae are normal.  Neck: Neck supple.  Cardiovascular:  No murmur heard. A. fib with RVR  Pulmonary/Chest:  Increased work of breathing with diffuse rales throughout.  Abdominal: Soft. There is no tenderness.  Musculoskeletal: He exhibits edema.  Neurological: He is alert.  Skin: Skin is warm and dry.  Psychiatric: He has a normal mood and affect.  Nursing note and vitals reviewed.    ED Treatments / Results  Labs (all labs ordered are listed, but only abnormal results are displayed) Labs Reviewed  BASIC METABOLIC PANEL - Abnormal; Notable for the following components:      Result Value   Potassium 6.7 (*)    Chloride 98 (*)    Glucose, Bld 62 (*)    BUN 60 (*)    Creatinine, Ser 14.45 (*)    Calcium 8.6 (*)  GFR calc non Af Amer 3 (*)    GFR calc Af Amer 3 (*)    Anion gap 24 (*)    All other components within normal limits  CBC - Abnormal; Notable for the following components:   RBC 3.15 (*)    Hemoglobin 10.2 (*)    HCT 32.6 (*)    MCV 103.5 (*)    Platelets 109 (*)    All other components within normal limits  TROPONIN I - Abnormal; Notable for the following components:   Troponin I 0.28 (*)    All other components within normal limits  BRAIN NATRIURETIC PEPTIDE - Abnormal; Notable for the following components:   B Natriuretic Peptide >4,500.0 (*)    All other components within normal limits  BASIC METABOLIC PANEL - Abnormal; Notable for the following components:   Chloride 95 (*)    BUN 24 (*)    Creatinine, Ser 7.17 (*)    Calcium 8.6 (*)    GFR calc non Af Amer 7 (*)    GFR calc Af Amer 8 (*)    Anion gap 18 (*)    All other components within normal limits  CBC - Abnormal; Notable for the following components:   RBC 3.10 (*)    Hemoglobin 9.9 (*)    HCT 31.7 (*)    MCV 102.3 (*)    Platelets 105 (*)    All other components within normal limits  GLUCOSE, CAPILLARY -  Abnormal; Notable for the following components:   Glucose-Capillary 39 (*)    All other components within normal limits  GLUCOSE, CAPILLARY - Abnormal; Notable for the following components:   Glucose-Capillary 122 (*)    All other components within normal limits  GLUCOSE, CAPILLARY - Abnormal; Notable for the following components:   Glucose-Capillary 43 (*)    All other components within normal limits  GLUCOSE, CAPILLARY - Abnormal; Notable for the following components:   Glucose-Capillary 49 (*)    All other components within normal limits  I-STAT VENOUS BLOOD GAS, ED - Abnormal; Notable for the following components:   pO2, Ven 18.0 (*)    All other components within normal limits  MRSA PCR SCREENING  GLUCOSE, CAPILLARY  GLUCOSE, CAPILLARY    EKG EKG Interpretation  Date/Time:  Saturday Feb 23 2018 21:07:02 EDT Ventricular Rate:  169 PR Interval:    QRS Duration: 141 QT Interval:  319 QTC Calculation: 535 R Axis:   -103 Text Interpretation:  Atrial fib RVR Confirmed by Blane Ohara [phone]) on 02/23/2018 9:13:52 PM   Radiology Dg Chest Port 1 View  Result Date: 02/23/2018 CLINICAL DATA:  Acute onset of shortness of breath. EXAM: PORTABLE CHEST 1 VIEW COMPARISON:  Chest radiograph performed 02/20/2018 FINDINGS: A small left pleural effusion is noted. Mild left basilar airspace opacity may reflect atelectasis or possibly mild asymmetric interstitial edema. No pneumothorax is seen, though the lung apices are partially obscured by the patient's head. The cardiomediastinal silhouette is borderline normal in size. No acute osseous abnormalities are seen. IMPRESSION: Small left pleural effusion noted. Mild left basilar airspace opacity may reflect atelectasis or possibly mild asymmetric interstitial edema. Electronically Signed   By: Roanna Raider M.D.   On: 02/23/2018 21:38    Procedures Procedures (including critical care time)  Medications Ordered in ED Medications    pentafluoroprop-tetrafluoroeth (GEBAUERS) aerosol 1 application (has no administration in time range)  lidocaine-prilocaine (EMLA) cream 1 application (has no administration in time range)  0.9 %  sodium  chloride infusion (has no administration in time range)  ammonium lactate (AMLACTIN) 12 % cream ( Topical Hold 02/24/18 1145)  ferric citrate (AURYXIA) tablet 420 mg (420 mg Oral Not Given 02/24/18 1640)  calcitRIOL (ROCALTROL) capsule 1 mcg (has no administration in time range)  calcium acetate (PHOSLO) capsule 2,001 mg (2,001 mg Oral Not Given 02/24/18 1638)  pantoprazole (PROTONIX) EC tablet 40 mg (40 mg Oral Not Given 02/24/18 1146)  metoprolol succinate (TOPROL-XL) 24 hr tablet 12.5 mg (12.5 mg Oral Not Given 02/24/18 1146)  nicotine (NICODERM CQ - dosed in mg/24 hours) patch 21 mg (21 mg Transdermal Not Given 02/24/18 1146)  triamcinolone cream (KENALOG) 0.1 % ( Topical Not Given 02/24/18 1147)  acetaminophen (TYLENOL) tablet 650 mg (has no administration in time range)    Or  acetaminophen (TYLENOL) suppository 650 mg (has no administration in time range)  senna-docusate (Senokot-S) tablet 1 tablet (has no administration in time range)  bisacodyl (DULCOLAX) EC tablet 5 mg (has no administration in time range)  ondansetron (ZOFRAN) tablet 4 mg (has no administration in time range)    Or  ondansetron (ZOFRAN) injection 4 mg (has no administration in time range)  heparin injection 5,000 Units (5,000 Units Subcutaneous Given 02/24/18 1500)  LORazepam (ATIVAN) injection 1-2 mg (1 mg Intravenous Given 02/24/18 1549)  oxyCODONE (Oxy IR/ROXICODONE) immediate release tablet 5 mg (has no administration in time range)  dextrose 10 % infusion (30 mL/hr Intravenous New Bag/Given 02/24/18 1549)  calcium gluconate 1 g in sodium chloride 0.9 % 100 mL IVPB (0 g Intravenous Stopped 02/23/18 2222)  diltiazem (CARDIZEM) 100 mg in dextrose 5% (1 mg/mL) infusion (5 mg/hr Intravenous New Bag/Given 02/23/18 2229)   insulin aspart (novoLOG) injection 5 Units (5 Units Intravenous Given 02/23/18 2252)  dextrose 50 % solution 50 mL (50 mLs Intravenous Given 02/23/18 2252)  diltiazem (CARDIZEM) 100 mg in dextrose 5% (1 mg/mL) infusion (5 mg/hr Intravenous New Bag/Given 02/24/18 0603)  dextrose 50 % solution (50 mLs  Given 02/24/18 0528)  dextrose 50 % solution (50 mLs Periarticular Given 02/24/18 1210)  dextrose 50 % solution 25 g (25 g Intravenous Given 02/24/18 1216)  dextrose 50 % solution 25 g (25 g Intravenous Given 02/24/18 1639)     Initial Impression / Assessment and Plan / ED Course  I have reviewed the triage vital signs and the nursing notes.  Pertinent labs & imaging results that were available during my care of the patient were reviewed by me and considered in my medical decision making (see chart for details).     Patient noted to be hypoxic and was placed on nasal cannula, however patient remained hypoxic and nonrebreather was placed.  Patient still unable to maintain saturations and patient was placed on BiPAP.  Patient initially did not tolerate, however long discussion was had and patient ultimately agreed to BiPAP as a post intubation.  Patient found to be hyperkalemic, given albuterol, insulin glucose and calcium.  Patient was tachycardic with A. fib to the 190s and started on diltiazem.  Patient admitted to stepdown unit for further evaluation and care.  Final Clinical Impressions(s) / ED Diagnoses   Final diagnoses:  Hyperkalemia  ESRD needing dialysis [facility])  Acute dyspnea  Hypoxia    ED Discharge Orders    None       Caren Griffins, MD 02/24/18 1643    Blane Ohara, MD 02/25/18 [phone]

## 2018-02-23 NOTE — ED Notes (Signed)
Pt's son arrived and states pt has not only missed the last 2 dialysis tx but has not been completing any of his dialysis sessions lately. Altered status is increasingly worse per son

## 2018-02-23 NOTE — Consult Note (Signed)
Renal Service Consult Note [facility]  Shane [name] 02/23/2018 Shane [name] Requesting Physician:  Shane Shane [name]  Reason for Consult:  ESRD pt with resp distress, high K+ HPI: The patient is a 77 y.o. year-old with hx AL amyloid, ESRD on HD, atrial fib who presents to ED with sob and resp distress.  Was on bipap briefly in ed but didn't tolerate well. Asked to see for dialysis.   K+ is 6.7 first ekg showed afib with rvr heart rate 160's started on iv dilt and heart rate better now in the 90's.  BP's here 120/80.  Home meds is on toprol xl 12.5 qd only bp/ cardiac medication.  Son says he signs off early frequently at dialysis they try to get him to stay on.  Has some dementia and has had some hallucinations recently, but no belligerence or agitation.    Pt was in ED per family on Wed and missed dialysis, then missed Friday because he didn't feel well.    Patient is sleeping but awakens and responds to commands properly.  No chest pain.     ROS  denies CP  no joint pain   no HA  no blurry vision  no rash  no diarrhea  no nausea/ vomiting   Past Medical History  Past Medical History:  Diagnosis Date  . 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 Arthritis    bursitis in hip  . Bilateral inguinal hernia (BIH)    right > left side  . Chronic kidney disease    secondary to amyloidosis AL lambda phenotype, HD MWF Shane. Reynolds [name]  . Elevated troponin 04/12/2015  . Full dentures   . Hypertension   . Tobacco abuse   . Urinary tract infection due to Enterococcus 08/03/2015  . Varicocele    Past Surgical History  Past Surgical History:  Procedure Laterality Date  . AV FISTULA PLACEMENT, BRACHIOCEPHALIC  10/19/2010   LEFT  . AV FISTULA PLACEMENT, RADIOCEPHALIC  01/30/2011   Right w/ ligation of competing venous branch by Shane. Leonides [name]  . AV FISTULA PLACEMENT, RADIOCEPHALIC  09/13/2010   LEFT  . BASCILIC VEIN  TRANSPOSITION Left 04/08/2015   Procedure: BASILIC VEIN TRANSPOSITION;  Surgeon: Shane Ochoa, MD;  Location: [facility] OR;  Service: Vascular;  Laterality: Left;  . COLONOSCOPY    . INGUINAL HERNIA REPAIR Bilateral 11/25/2015   Procedure: LAPAROSCOPIC BILATERAL INGUINAL HERNIA REPAIR--EXCISION OF RIGHT HYDROCELE;  Surgeon: Shane Soda, MD;  Location: [facility] OR;  Service: General;  Laterality: Bilateral;  . INSERTION OF MESH Bilateral 11/25/2015   Procedure: INSERTION OF MESH;  Surgeon: Shane Soda, MD;  Location: [facility] OR;  Service: General;  Laterality: Bilateral;  . MULTIPLE TOOTH EXTRACTIONS     Family History  Family History  Problem Relation Age of Onset  . Asthma Mother   . Hypertension Mother   . Alcohol abuse Father   . Hypertension Father   . Hypertension Sister   . Diabetes Sister   . Heart disease Sister   . Hypertension Brother   . Peripheral vascular disease Brother   . Hypertension Daughter   . Heart disease Daughter    Social History  reports that he has been smoking cigarettes.  He has a 50.00 pack-year smoking history. He has never used smokeless tobacco. He reports that he does not drink alcohol or use drugs. Allergies No Known Allergies Home medications Prior to Admission medications  Medication Sig Start Date End Date Taking? Authorizing Provider  ammonium lactate (AMLACTIN) 12 % cream Apply topically daily. 01/30/18  Yes Shane Ren, MD  AURYXIA 1 GM 210 MG(Fe) tablet Take 420 mg by mouth 3 (three) times daily with meals.  01/08/18  Yes [provider]  calcitRIOL (ROCALTROL) 0.5 MCG capsule Take 2 capsules (1 mcg total) by mouth every Monday, Wednesday, and Friday with hemodialysis. 01/30/18  Yes Shane Ren, MD  calcium acetate (PHOSLO) 667 MG capsule Take 3 capsules (2,001 mg total) by mouth 3 (three) times daily with meals. 01/29/18  Yes Shane Ren, MD  esomeprazole (NEXIUM) 20 MG capsule Take 20 mg by mouth daily before breakfast. 01/07/18   Yes [provider]  metoprolol succinate (TOPROL-XL) 25 MG 24 hr tablet Take 0.5 tablets (12.5 mg total) by mouth daily. 01/30/18  Yes Shane Ren, MD  nicotine (NICODERM CQ - DOSED IN MG/24 HOURS) 21 mg/24hr patch Place 1 patch (21 mg total) onto the skin daily. 01/30/18  Yes Shane Ren, MD  triamcinolone cream (KENALOG) 0.1 % Apply topically daily. 01/30/18  Yes Shane Ren, MD  dextromethorphan-guaiFENesin [facility] DM) 30-600 MG 12hr tablet Take 1 tablet by mouth 2 (two) times daily as needed for cough. Patient not taking: Reported on 02/23/2018 01/29/18   Shane Ren, MD   Liver Function Tests No results for input(s): AST, ALT, ALKPHOS, BILITOT, PROT, ALBUMIN in the last 168 hours. No results for input(s): LIPASE, AMYLASE in the last 168 hours. CBC Recent Labs  Lab 02/20/18 1026 02/20/18 1033 02/23/18 2055  WBC 4.8  --  5.6  NEUTROABS 3.6  --   --   HGB 9.5* 10.2* 10.2*  HCT 30.6* 30.0* 32.6*  MCV 105.2*  --  103.5*  PLT 102*  --  109*   Basic Metabolic Panel Recent Labs  Lab 02/20/18 1033 02/23/18 2055  NA 141 144  K 5.1 6.7*  CL 101 98*  CO2  --  22  GLUCOSE 103* 62*  BUN 41* 60*  CREATININE 12.10* 14.45*  CALCIUM  --  8.6*   Iron/TIBC/Ferritin/ %Sat No results found for: IRON, TIBC, FERRITIN, IRONPCTSAT  Vitals:   02/23/18 2200 02/23/18 2215 02/23/18 2226 02/23/18 2230  BP: 96/74 138/84 107/72 (!) 133/106  Pulse:  74 (!) 115 (!) 120  Resp:  (!) 26 (!) 23 (!) 23  SpO2: (!) 84% (!) 66% (!) 75% (!) 80%   Exam Gen groggy arouses easily, mild ^wob No rash, cyanosis or gangrene Sclera anicteric, throat clear   No jvd or bruits Chest coarse rhonchi bilat, no wheezing  RRR no MRG Abd soft ntnd no mass or ascites +bs GU normal male defer MS no joint effusions or deformity Ext 2+ bilat LE edema, no wounds or ulcers Neuro is awakes follows commands  CXR - mild edema   Dialysis: MWF 4h 400/800 2/2.25 bath L AVF  Hep none   Impression: 1  Resp distress - pulm edema also hx of copd but no sig wheezing on exam 2  Vol overload - sig LE edema, missed HD x 2 3  ESRD MWF HD. Doesn't stay on machine full time per family.  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

## 2018-02-23 NOTE — ED Notes (Signed)
Pt intermittently tolerating BiPAP

## 2018-02-23 NOTE — ED Notes (Addendum)
Pt less alert, GCS from 14 to 10. Pt placed on NRB, RT called, MD made aware.  Pulse Ox picking up intermittently. Pt sats dropped from 100% to 80% on 2 L, increased to 5L with no increase in sats.  Pt placed on NRB, sats improved to 77-86%.

## 2018-02-23 NOTE — ED Notes (Signed)
ED Provider at bedside. 

## 2018-02-23 NOTE — H&P (Signed)
History and Physical   [facility] - [place] @ [place] Admission History and Physical [employer], D.O.    Patient Name: Shane [name] MR#: [id number] Date of Birth: 1940/12/04 Date of Admission: 02/23/2018  Referring MD/NP/PA: Dr. Jodi Mourning Primary Care Physician: [name], MD  Chief Complaint:  Chief Complaint  Patient presents with  . Altered Mental Status  Please note the entire history is obtained from the patient's emergency department chart, emergency department provider.Patient's personal history is limited by  altered mental status.   HPI: Shane [name] is a 77 y.o. 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  Patient was in a usual state of health until this evening when he developed SOB/respiratory distress.  Missed Wed and Fri HD and often leaves HD early. .    EMS/ED Course: Patient received diltiazem, duoneb, D50%, calcium, insulin. Medical admission has been requested for further management of acute respiratory failure, hyperkalemia, requiring urgent HD.  Review of Systems:  Unable to obtain 2/2 AMS   Past Medical History:  Diagnosis Date  . 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 Arthritis    bursitis in hip  . Bilateral inguinal hernia (BIH)    right > left side  . 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 Reynolds Bowl  . Elevated troponin 04/12/2015  . Full dentures   . Hypertension   . Tobacco abuse   . Urinary tract infection due to Enterococcus 08/03/2015  . Varicocele     Past Surgical History:  Procedure Laterality Date  . AV FISTULA PLACEMENT, BRACHIOCEPHALIC  10/19/2010   LEFT  . AV FISTULA PLACEMENT, RADIOCEPHALIC  01/30/2011   Right w/ ligation of competing venous branch by Dr. Leonides Sake  . AV  FISTULA PLACEMENT, RADIOCEPHALIC  09/13/2010   LEFT  . BASCILIC VEIN TRANSPOSITION Left 04/08/2015   Procedure: BASILIC VEIN TRANSPOSITION;  Surgeon: Pryor Ochoa, MD;  Location: [facility] OR;  Service: Vascular;  Laterality: Left;  . COLONOSCOPY    . INGUINAL HERNIA REPAIR Bilateral 11/25/2015   Procedure: LAPAROSCOPIC BILATERAL INGUINAL HERNIA REPAIR--EXCISION OF RIGHT HYDROCELE;  Surgeon: Karie Soda, MD;  Location: [facility] OR;  Service: General;  Laterality: Bilateral;  . INSERTION OF MESH Bilateral 11/25/2015   Procedure: INSERTION OF MESH;  Surgeon: Karie Soda, MD;  Location: [facility] OR;  Service: General;  Laterality: Bilateral;  . MULTIPLE TOOTH EXTRACTIONS       reports that he has been smoking cigarettes.  He has a 50.00 pack-year smoking history. He has never used smokeless tobacco. He reports that he does not drink alcohol or use drugs.  No Known Allergies  Family History  Problem Relation Age of Onset  . Asthma Mother   . Hypertension Mother   . Alcohol abuse Father   . Hypertension Father   . Hypertension Sister   . Diabetes Sister   . Heart disease Sister   . Hypertension Brother   . Peripheral vascular disease Brother   . Hypertension Daughter   . Heart disease Daughter     Prior to Admission medications   Medication Sig Start Date End Date Taking? Authorizing Provider  ammonium lactate (AMLACTIN) 12 % cream Apply topically daily. 01/30/18  Yes Alwyn Ren, MD  AURYXIA 1 GM 210 MG(Fe) tablet Take 420 mg by  mouth 3 (three) times daily with meals.  01/08/18  Yes [provider]  calcitRIOL (ROCALTROL) 0.5 MCG capsule Take 2 capsules (1 mcg total) by mouth every Monday, Wednesday, and Friday with hemodialysis. 01/30/18  Yes Alwyn Ren, MD  calcium acetate (PHOSLO) 667 MG capsule Take 3 capsules (2,001 mg total) by mouth 3 (three) times daily with meals. 01/29/18  Yes Alwyn Ren, MD  esomeprazole (NEXIUM) 20 MG capsule Take 20 mg by mouth daily  before breakfast. 01/07/18  Yes [provider]  metoprolol succinate (TOPROL-XL) 25 MG 24 hr tablet Take 0.5 tablets (12.5 mg total) by mouth daily. 01/30/18  Yes Alwyn Ren, MD  nicotine (NICODERM CQ - DOSED IN MG/24 HOURS) 21 mg/24hr patch Place 1 patch (21 mg total) onto the skin daily. 01/30/18  Yes Alwyn Ren, MD  triamcinolone cream (KENALOG) 0.1 % Apply topically daily. 01/30/18  Yes Alwyn Ren, MD  dextromethorphan-guaiFENesin [facility] DM) 30-600 MG 12hr tablet Take 1 tablet by mouth 2 (two) times daily as needed for cough. Patient not taking: Reported on 02/23/2018 01/29/18   Alwyn Ren, MD    Physical Exam: Vitals:   02/23/18 2226 02/23/18 2230 02/23/18 2315 02/23/18 2330  BP: 107/72 (!) 133/106 121/90 120/85  Pulse: (!) 115 (!) 120 86 88  Resp: (!) 23 (!) SpO2: (!) 75% (!) 80% 93% 93%    GENERAL: 77 y.o.-year-old male patient, chronically ill appearing, sitting up in the bed in no acute distress.  Opens eyes to stimuli but not conversive.  HEENT: Head atraumatic, normocephalic. Pupils equal. Mucus membranes moist. NECK: Supple, full range of motion. No JVD CHEST: Normal breath sounds bilaterally. No wheezing, rales, rhonchi or crackles. No use of accessory muscles of respiration.  No reproducible chest wall tenderness.  CARDIOVASCULAR: S1, S2 normal. No murmurs, rubs, or gallops. Cap refill <2 seconds. Pulses intact distally.  ABDOMEN: Soft, nondistended, nontender. No rebound, guarding, rigidity. Normoactive bowel sounds present in all four quadrants.  EXTREMITIES: Fistula at left arm. Trace  pedal edema, cyanosis, or clubbing. No calf tenderness or Homan's sign.  NEUROLOGIC: The patient is arousable, but not conversive. SKIN: Warm, dry, and intact without obvious rash, lesion, or ulcer.    Labs on Admission:  CBC: Recent Labs  Lab 02/20/18 1026 02/20/18 1033 02/23/18 2055  WBC 4.8  --  5.6  NEUTROABS 3.6  --   --    HGB 9.5* 10.2* 10.2*  HCT 30.6* 30.0* 32.6*  MCV 105.2*  --  103.5*  PLT 102*  --  109*   Basic Metabolic Panel: Recent Labs  Lab 02/20/18 1033 02/23/18 2055  NA 141 144  K 5.1 6.7*  CL 101 98*  CO2  --  22  GLUCOSE 103* 62*  BUN 41* 60*  CREATININE 12.10* 14.45*  CALCIUM  --  8.6*   GFR: Estimated Creatinine Clearance: 3.8 mL/min (A) (by C-G formula based on SCr of 14.45 mg/dL (H)). Liver Function Tests: No results for input(s): AST, ALT, ALKPHOS, BILITOT, PROT, ALBUMIN in the last 168 hours. No results for input(s): LIPASE, AMYLASE in the last 168 hours. No results for input(s): AMMONIA in the last 168 hours. Coagulation Profile: No results for input(s): INR, PROTIME in the last 168 hours. Cardiac Enzymes: Recent Labs  Lab 02/23/18 2055  TROPONINI 0.28*   BNP (last 3 results) No results for input(s): PROBNP in the last 8760 hours. HbA1C: No results for input(s): HGBA1C in the last  72 hours. CBG: No results for input(s): GLUCAP in the last 168 hours. Lipid Profile: No results for input(s): CHOL, HDL, LDLCALC, TRIG, CHOLHDL, LDLDIRECT in the last 72 hours. Thyroid Function Tests: No results for input(s): TSH, T4TOTAL, FREET4, T3FREE, THYROIDAB in the last 72 hours. Anemia Panel: No results for input(s): VITAMINB12, FOLATE, FERRITIN, TIBC, IRON, RETICCTPCT in the last 72 hours. Urine analysis:    Component Value Date/Time   COLORURINE AMBER (A) 01/25/2018 1540   APPEARANCEUR CLEAR 01/25/2018 1540   LABSPEC 1.014 01/25/2018 1540   PHURINE 7.0 01/25/2018 1540   GLUCOSEU 50 (A) 01/25/2018 1540   HGBUR NEGATIVE 01/25/2018 1540   BILIRUBINUR NEGATIVE 01/25/2018 1540   KETONESUR NEGATIVE 01/25/2018 1540   PROTEINUR 100 (A) 01/25/2018 1540   UROBILINOGEN 0.2 08/01/2015 1430   NITRITE NEGATIVE 01/25/2018 1540   LEUKOCYTESUR NEGATIVE 01/25/2018 1540   Sepsis Labs: (procalcitonin:4,lacticidven:4) )No results found for this or any previous visit (from  the past 240 hour(s)).   Radiological Exams on Admission: Dg Chest Port 1 View  Result Date: 02/23/2018 CLINICAL DATA:  Acute onset of shortness of breath. EXAM: PORTABLE CHEST 1 VIEW COMPARISON:  Chest radiograph performed 02/20/2018 FINDINGS: A small left pleural effusion is noted. Mild left basilar airspace opacity may reflect atelectasis or possibly mild asymmetric interstitial edema. No pneumothorax is seen, though the lung apices are partially obscured by the patient's head. The cardiomediastinal silhouette is borderline normal in size. No acute osseous abnormalities are seen. IMPRESSION: Small left pleural effusion noted. Mild left basilar airspace opacity may reflect atelectasis or possibly mild asymmetric interstitial edema. Electronically Signed   By: Roanna Raider M.D.   On: 02/23/2018 21:38    EKG: Afib with RVR at 169 bpm.  Assessment/Plan  This is a 77 y.o. male with a history of ESRD on HD now being admitted with:  #. Acute respiratory failure with hypoxia requiring urgent HD - missed HD Wed / Fri. - Admit inpatient stepdown - Nephrology consulted by ED for urgent HD - O2, nebs as needed  #. Afib with RVR - Telemetry monitoring - Diltiazem drip for rate control - Continue metoprolol  #. H/O ESRD - Continue Auryxia, Calcitriol, Phoslo  #. History of tobacco use disorder - Continue Nicotine patch  #. History of GERD - Continue Protonix for Nexium.  Admission status: Inpatient, stepdown IV Fluids: HL Diet/Nutrition: Renal with fluid restriction Consults called: Nephrology called by ED  DVT Px: Heparin, SCDs and early ambulation. Code Status: Full Code  Disposition Plan: To home in 1-2 days  All the records are reviewed and case discussed with ED provider. Management plans discussed with the patient and/or family who express understanding and agree with plan of care.  Maneh Sieben D.O. on 02/23/2018 at 11:35 PM CC: Primary care physician; [name],  MD   02/23/2018, 11:35 PM

## 2018-02-24 DIAGNOSIS — R0602 Shortness of breath: Secondary | ICD-10-CM

## 2018-02-24 DIAGNOSIS — N186 End stage renal disease: Secondary | ICD-10-CM

## 2018-02-24 DIAGNOSIS — Z992 Dependence on renal dialysis: Secondary | ICD-10-CM

## 2018-02-24 LAB — GLUCOSE, CAPILLARY
GLUCOSE-CAPILLARY: 71 mg/dL (ref 65–99)
GLUCOSE-CAPILLARY: 43 mg/dL — AB (ref 65–99)
GLUCOSE-CAPILLARY: 106 mg/dL — AB (ref 65–99)
Glucose-Capillary: 39 mg/dL — CL (ref 65–99)
Glucose-Capillary: 122 mg/dL — ABNORMAL HIGH (ref 65–99)
Glucose-Capillary: 96 mg/dL (ref 65–99)
Glucose-Capillary: 49 mg/dL — ABNORMAL LOW (ref 65–99)
Glucose-Capillary: 111 mg/dL — ABNORMAL HIGH (ref 65–99)

## 2018-02-24 LAB — BASIC METABOLIC PANEL
Anion gap: 18 — ABNORMAL HIGH (ref 5–15)
BUN: 24 mg/dL — AB (ref 6–20)
CHLORIDE: 95 mmol/L — AB (ref 101–111)
CO2: 27 mmol/L (ref 22–32)
CREATININE: 7.17 mg/dL — AB (ref 0.61–1.24)
Calcium: 8.6 mg/dL — ABNORMAL LOW (ref 8.9–10.3)
GFR calc Af Amer: 8 mL/min — ABNORMAL LOW (ref >60)
GFR calc non Af Amer: 7 mL/min — ABNORMAL LOW (ref >60)
GLUCOSE: 82 mg/dL (ref 65–99)
Potassium: 3.9 mmol/L (ref 3.5–5.1)
Sodium: 140 mmol/L (ref 135–145)

## 2018-02-24 LAB — CBC
HCT: 31.7 % — ABNORMAL LOW (ref 39.0–52.0)
Hemoglobin: 9.9 g/dL — ABNORMAL LOW (ref 13.0–17.0)
MCH: 31.9 pg (ref 26.0–34.0)
MCHC: 31.2 g/dL (ref 30.0–36.0)
MCV: 102.3 fL — AB (ref 78.0–100.0)
PLATELETS: 105 10*3/uL — AB (ref 150–400)
RBC: 3.1 MIL/uL — ABNORMAL LOW (ref 4.22–5.81)
RDW: 14.4 % (ref 11.5–15.5)
WBC: 6.9 10*3/uL (ref 4.0–10.5)

## 2018-02-24 LAB — MRSA PCR SCREENING: MRSA by PCR: NEGATIVE

## 2018-02-24 MED ORDER — ONDANSETRON HCL 4 MG/2ML IJ SOLN: 4.0000 mg | Freq: Four times a day (QID) | INTRAMUSCULAR | Status: DC | PRN

## 2018-02-24 MED ORDER — CALCITRIOL 0.25 MCG PO CAPS
1.0000 ug | ORAL_CAPSULE | ORAL | Status: DC
Start: 2018-02-25 — End: 2018-03-10
  Administered 2018-03-01 – 2018-03-08 (×3): 1 ug via ORAL
  Filled 2018-02-24 (×4): qty 4

## 2018-02-24 MED ORDER — DEXTROSE 50 % IV SOLN
25.0000 g | Freq: Once | INTRAVENOUS | Status: AC
  Administered 2018-02-24: 25 g via INTRAVENOUS

## 2018-02-24 MED ORDER — PANTOPRAZOLE SODIUM 40 MG PO TBEC
40.0000 mg | DELAYED_RELEASE_TABLET | Freq: Every day | ORAL | Status: DC
  Administered 2018-02-25 – 2018-03-10 (×12): 40 mg via ORAL
  Filled 2018-02-24 (×12): qty 1

## 2018-02-24 MED ORDER — OXYCODONE HCL 5 MG PO TABS
5.0000 mg | ORAL_TABLET | ORAL | Status: DC | PRN
  Administered 2018-02-26: 5 mg via ORAL
  Filled 2018-02-24 (×2): qty 1

## 2018-02-24 MED ORDER — AMMONIUM LACTATE 12 % EX CREA
TOPICAL_CREAM | Freq: Every day | CUTANEOUS | Status: DC
  Administered 2018-02-25 – 2018-03-10 (×10): via TOPICAL
  Filled 2018-02-24 (×4): qty 140

## 2018-02-24 MED ORDER — ONDANSETRON HCL 4 MG PO TABS: 4.0000 mg | ORAL_TABLET | Freq: Four times a day (QID) | ORAL | Status: DC | PRN

## 2018-02-24 MED ORDER — ALBUTEROL SULFATE (2.5 MG/3ML) 0.083% IN NEBU
2.5000 mg | INHALATION_SOLUTION | Freq: Four times a day (QID) | RESPIRATORY_TRACT | Status: DC | PRN
  Administered 2018-02-24: 2.5 mg via RESPIRATORY_TRACT
  Filled 2018-02-24: qty 3

## 2018-02-24 MED ORDER — METOPROLOL SUCCINATE ER 25 MG PO TB24
12.5000 mg | ORAL_TABLET | Freq: Every day | ORAL | Status: DC
  Administered 2018-02-25 – 2018-03-10 (×11): 12.5 mg via ORAL
  Filled 2018-02-24 (×12): qty 1

## 2018-02-24 MED ORDER — DILTIAZEM HCL-DEXTROSE 100-5 MG/100ML-% IV SOLN (PREMIX)
5.0000 mg/h | Freq: Once | INTRAVENOUS | Status: AC
  Administered 2018-02-24: 5 mg/h via INTRAVENOUS

## 2018-02-24 MED ORDER — HEPARIN SODIUM (PORCINE) 5000 UNIT/ML IJ SOLN
5000.0000 [IU] | Freq: Three times a day (TID) | INTRAMUSCULAR | Status: DC
  Administered 2018-02-24 – 2018-03-10 (×34): 5000 [IU] via SUBCUTANEOUS
  Filled 2018-02-24 (×34): qty 1

## 2018-02-24 MED ORDER — SENNOSIDES-DOCUSATE SODIUM 8.6-50 MG PO TABS: 1.0000 | ORAL_TABLET | Freq: Every evening | ORAL | Status: DC | PRN

## 2018-02-24 MED ORDER — IPRATROPIUM BROMIDE 0.02 % IN SOLN
0.5000 mg | Freq: Four times a day (QID) | RESPIRATORY_TRACT | Status: DC | PRN
  Administered 2018-02-24: 0.5 mg via RESPIRATORY_TRACT
  Filled 2018-02-24: qty 2.5

## 2018-02-24 MED ORDER — OXYCODONE HCL 5 MG PO TABS: 5.0000 mg | ORAL_TABLET | ORAL | Status: DC | PRN

## 2018-02-24 MED ORDER — CALCIUM ACETATE (PHOS BINDER) 667 MG PO CAPS
2001.0000 mg | ORAL_CAPSULE | Freq: Three times a day (TID) | ORAL | Status: DC
  Administered 2018-02-25 – 2018-03-05 (×15): 2001 mg via ORAL
  Filled 2018-02-24 (×17): qty 3

## 2018-02-24 MED ORDER — FERRIC CITRATE 1 GM 210 MG(FE) PO TABS
420.0000 mg | ORAL_TABLET | Freq: Three times a day (TID) | ORAL | Status: DC
  Administered 2018-02-25 (×2): 420 mg via ORAL
  Filled 2018-02-24 (×8): qty 2

## 2018-02-24 MED ORDER — LORAZEPAM 2 MG/ML IJ SOLN
1.0000 mg | INTRAMUSCULAR | Status: DC | PRN
  Administered 2018-02-24: 1 mg via INTRAVENOUS
  Administered 2018-02-24: 2 mg via INTRAVENOUS
  Administered 2018-02-24: 1 mg via INTRAVENOUS
  Administered 2018-02-25: 2 mg via INTRAVENOUS
  Filled 2018-02-24 (×5): qty 1

## 2018-02-24 MED ORDER — DEXTROSE 10 % IV SOLN
INTRAVENOUS | Status: DC
  Administered 2018-02-24: 30 mL/h via INTRAVENOUS
  Administered 2018-02-25: 13:00:00 via INTRAVENOUS

## 2018-02-24 MED ORDER — ACETAMINOPHEN 325 MG PO TABS
650.0000 mg | ORAL_TABLET | Freq: Four times a day (QID) | ORAL | Status: DC | PRN
  Administered 2018-03-03: 650 mg via ORAL
  Filled 2018-02-24 (×2): qty 2

## 2018-02-24 MED ORDER — TRIAMCINOLONE ACETONIDE 0.1 % EX CREA
TOPICAL_CREAM | Freq: Every day | CUTANEOUS | Status: DC
  Administered 2018-02-25 – 2018-03-02 (×3): via TOPICAL
  Administered 2018-03-03: 1 via TOPICAL
  Administered 2018-03-06 – 2018-03-10 (×4): via TOPICAL
  Filled 2018-02-24: qty 15

## 2018-02-24 MED ORDER — ACETAMINOPHEN 650 MG RE SUPP
650.0000 mg | Freq: Four times a day (QID) | RECTAL | Status: DC | PRN
  Administered 2018-03-03: 650 mg via RECTAL
  Filled 2018-02-24: qty 1

## 2018-02-24 MED ORDER — MAGNESIUM CITRATE PO SOLN: 1.0000 | Freq: Once | ORAL | Status: DC | PRN

## 2018-02-24 MED ORDER — DEXTROSE 50 % IV SOLN
INTRAVENOUS | Status: AC
  Administered 2018-02-24: 50 mL via PERIARTICULAR
  Filled 2018-02-24: qty 50

## 2018-02-24 MED ORDER — DEXTROSE 50 % IV SOLN
INTRAVENOUS | Status: AC
  Administered 2018-02-24: 50 mL
  Filled 2018-02-24: qty 50

## 2018-02-24 MED ORDER — BISACODYL 5 MG PO TBEC: 5.0000 mg | DELAYED_RELEASE_TABLET | Freq: Every day | ORAL | Status: DC | PRN

## 2018-02-24 MED ORDER — NICOTINE 21 MG/24HR TD PT24
21.0000 mg | MEDICATED_PATCH | Freq: Every day | TRANSDERMAL | Status: DC
  Administered 2018-02-25 – 2018-03-05 (×8): 21 mg via TRANSDERMAL
  Filled 2018-02-24 (×9): qty 1

## 2018-02-24 NOTE — Progress Notes (Signed)
HD tx initiated via 15Gx2 w/o problem, pull/push/flush well, VSS and on Cardizem drip from ED, will cont to monitor while on HD tx

## 2018-02-24 NOTE — Progress Notes (Signed)
Pt arrived on 4E with Zoll pads on chest, Hx with A-Fib RVR, Cardizem gtt, no nurse present during transport; CBG 39, Dextrose 50 given; SPO2 78 on room air; [place] given & re-breather - called Respiratory.

## 2018-02-24 NOTE — Progress Notes (Signed)
[facility] Progress Note  Subjective: confused and was agitated got ativan per primary and now sleeping we did get 4L off with hd overnight and oxygen sats are good today on room air  Vitals:   02/24/18 0546 02/24/18 0554 02/24/18 0747 02/24/18 1307  BP:   118/79 (!) 152/77  Pulse:   82 75  Resp:   19 (!) 22  Temp:    98.5 F (36.9 C)  TempSrc:   Oral Axillary  SpO2: (!) 89% 98% 96% 92%  Weight:        Inpatient medications: . ammonium lactate   Topical Daily  . [START ON 02/25/2018] calcitRIOL  1 mcg Oral Q M,W,F-HD  . calcium acetate  2,001 mg Oral TID WC  . dextrose  25 g Intravenous Once  . ferric citrate  420 mg Oral TID WC  . heparin  5,000 Units Subcutaneous Q8H  . metoprolol succinate  12.5 mg Oral Daily  . nicotine  21 mg Transdermal Daily  . pantoprazole  40 mg Oral Daily  . triamcinolone cream   Topical Daily   . sodium chloride     sodium chloride, acetaminophen **OR** acetaminophen, bisacodyl, lidocaine-prilocaine, LORazepam, ondansetron **OR** ondansetron (ZOFRAN) IV, oxyCODONE, pentafluoroprop-tetrafluoroeth, senna-docusate  Exam: Gen awake and agitated No jvd or bruits Chest mostly clear w/ decreased bs throughout RRR no MRG Abd soft ntnd no mass or ascites +bs MS no joint effusions or deformity Ext trace leg edema bilat Neuro is awakes follows commands confused    Dialysis: [facility] mwf 4h 400/800 2/2.25 bath L AVF Hep none   (dry wt in April was 66kg)     Impression: 1  Resp distress - pulm edema by cxr much better after 4 liters off hd last night 2  Vol overload missed hd x 2 3  ESRD MWF HD. Doesn't stay on machine full time per family. 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 Recent Labs  Lab 02/20/18 1026 02/20/18 1033 02/23/18 2055 02/24/18 0631  WBC 4.8  --  5.6 6.9  NEUTROABS 3.6  --   --   --   HGB 9.5* 10.2* 10.2* 9.9*  HCT 30.6* 30.0* 32.6* 31.7*  MCV 105.2*  --  103.5* 102.3*  PLT 102*  --  109* 105*   Iron/TIBC/Ferritin/ %Sat No results found for: IRON, TIBC, FERRITIN, IRONPCTSAT

## 2018-02-24 NOTE — Progress Notes (Signed)
HD tx completed @ 0400 w/o problem, UF goal met, blood rinsed back, VSS on a cardizem drip. Report called to Melinda Brown, RN 

## 2018-02-24 NOTE — Progress Notes (Signed)
PT Cancellation Note  Patient Details Name: JT BRABEC MRN: [id number] DOB: [birth year]   Cancelled Treatment:    Reason Eval/Treat Not Completed: Fatigue/lethargy limiting ability to participate -- noted pt medicated for agitation, is sleeping soundly at this time.  Will return to reattempt as time and alertness allow.     Narda Amber [facility] 02/24/2018, 1:24 PM

## 2018-02-24 NOTE — Progress Notes (Signed)
[place] TEAM 1 - Stepdown/ICU TEAM  Shane [name]  WUJ:[id number] DOB: [birth year] DOA: 02/23/2018 PCP: [name], MD    Brief Narrative:  77 y.o. 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  In the ED he was found to be grossly volume overloaded and hyperkalemic.  Significant Events: 5/18 admit   Subjective: The patient has been agitated and noncompliant since his admission.  He is confused.  He required a dose of Ativan 1 mg due to his agitation.  At time of my visit is resting comfortably.  There is no evidence of respiratory distress or uncontrolled pain.  There is no family present at the time of my exam.  Assessment & Plan:  ESRD w/ HD noncompliance > volume overload/pulmonary edema/acute hypoxic resp failure Rapidly improved with dialysis treatment - nephrology following - tentative plan to complete dialysis tomorrow and then discharge home  Dementia - agitation Records indicate a hx of severe agitation when admitted to hospital - reportedly has 24hr care at home - avoid sedation as able but given severity of and agitation may be required intermittently - tentative plan to discharge home to familiar surroundings after dialysis 5/20  HTN Well-controlled postdialysis  Chronic Afib w/ acute RVR Heart rate now well controlled - resume usual home medications - not a candidate for anticoagulation given noncompliance as well as dementia  DVT prophylaxis: SQ heparin Code Status: FULL CODE Family Communication: no family present at time of exam  Disposition Plan: plan for d/c home after HD 02/25/18  Consultants:  Nephrology  Antimicrobials:  none   Objective: Blood pressure 118/79, pulse 82, temperature 98.4 F (36.9 C), temperature source Axillary, resp. rate 19, weight 68.5 kg (151 lb 0.2 oz), SpO2 96 %.  Intake/Output  Summary (Last 24 hours) at 02/24/2018 1124 Last data filed at 02/24/2018 0400 Gross per 24 hour  Intake -  Output 4007 ml  Net -4007 ml   Filed Weights   02/24/18 0510  Weight: 68.5 kg (151 lb 0.2 oz)    Examination: General: No acute respiratory distress Lungs: Clear to auscultation bilaterally without wheezes or crackles Cardiovascular: Regular rate without murmur gallop or rub normal S1 and S2 Abdomen: Nontender, nondistended, soft, bowel sounds positive, no rebound, no ascites, no appreciable mass Extremities: No significant cyanosis, clubbing, or edema bilateral lower extremities  CBC: Recent Labs  Lab 02/20/18 1026 02/20/18 1033 02/23/18 2055 02/24/18 0631  WBC 4.8  --  5.6 6.9  NEUTROABS 3.6  --   --   --   HGB 9.5* 10.2* 10.2* 9.9*  HCT 30.6* 30.0* 32.6* 31.7*  MCV 105.2*  --  103.5* 102.3*  PLT 102*  --  109* 105*   Basic Metabolic Panel: Recent Labs  Lab 02/20/18 1033 02/23/18 2055 02/24/18 0631  NA 141 144 140  K 5.1 6.7* 3.9  CL 101 98* 95*  CO2  --  22 27  GLUCOSE 103* 62* 82  BUN 41* 60* 24*  CREATININE 12.10* 14.45* 7.17*  CALCIUM  --  8.6* 8.6*   GFR: Estimated Creatinine Clearance: 7.6 mL/min (A) (by C-G formula based on SCr of 7.17 mg/dL (H)).  Liver Function Tests: No results for input(s): AST, ALT, ALKPHOS, BILITOT, PROT, ALBUMIN in the last 168 hours. No results for input(s): LIPASE, AMYLASE in the last 168 hours. No results for input(s): AMMONIA in the last 168  hours.  Cardiac Enzymes: Recent Labs  Lab 02/23/18 2055  TROPONINI 0.28*   CBG: Recent Labs  Lab 02/24/18 0522 02/24/18 0555 02/24/18 0741  GLUCAP 39* 122* 71    Recent Results (from the past 240 hour(s))  MRSA PCR Screening     Status: None   Collection Time: 02/24/18  6:16 AM  Result Value Ref Range Status   MRSA by PCR NEGATIVE NEGATIVE Final    Comment:        The GeneXpert MRSA Assay (FDA approved for NASAL specimens only), is one component of  a comprehensive MRSA colonization surveillance program. It is not intended to diagnose MRSA infection nor to guide or monitor treatment for MRSA infections. Performed at [facility], [address] [place], [place] [address]      Scheduled Meds: . ammonium lactate   Topical Daily  . [START ON 02/25/2018] calcitRIOL  1 mcg Oral Q M,W,F-HD  . calcium acetate  2,001 mg Oral TID WC  . ferric citrate  420 mg Oral TID WC  . heparin  5,000 Units Subcutaneous Q8H  . ipratropium-albuterol  3 mL Nebulization Once  . metoprolol succinate  12.5 mg Oral Daily  . nicotine  21 mg Transdermal Daily  . pantoprazole  40 mg Oral Daily  . triamcinolone cream   Topical Daily     LOS: 1 day   Lonia Blood, MD [facility] Office  [phone] Pager - Text Page per Amion as per below:  On-Call/Text Page:      [email or web address]      password [email or web address]  If 7PM-7AM, please contact night-coverage [email or web address] Password [facility] 02/24/2018, 11:24 AM

## 2018-02-25 DIAGNOSIS — F0391 Unspecified dementia with behavioral disturbance: Secondary | ICD-10-CM

## 2018-02-25 LAB — BASIC METABOLIC PANEL
Anion gap: 19 — ABNORMAL HIGH (ref 5–15)
BUN: 32 mg/dL — AB (ref 6–20)
CO2: 24 mmol/L (ref 22–32)
CREATININE: 8.77 mg/dL — AB (ref 0.61–1.24)
Calcium: 8.3 mg/dL — ABNORMAL LOW (ref 8.9–10.3)
Chloride: 96 mmol/L — ABNORMAL LOW (ref 101–111)
GFR calc Af Amer: 6 mL/min — ABNORMAL LOW (ref >60)
GFR, EST NON AFRICAN AMERICAN: 5 mL/min — AB (ref >60)
GLUCOSE: 108 mg/dL — AB (ref 65–99)
POTASSIUM: 5.5 mmol/L — AB (ref 3.5–5.1)
Sodium: 139 mmol/L (ref 135–145)

## 2018-02-25 LAB — GLUCOSE, CAPILLARY
GLUCOSE-CAPILLARY: 96 mg/dL (ref 65–99)
Glucose-Capillary: 132 mg/dL — ABNORMAL HIGH (ref 65–99)
Glucose-Capillary: 120 mg/dL — ABNORMAL HIGH (ref 65–99)
Glucose-Capillary: 127 mg/dL — ABNORMAL HIGH (ref 65–99)
Glucose-Capillary: 111 mg/dL — ABNORMAL HIGH (ref 65–99)

## 2018-02-25 MED ORDER — LORAZEPAM 2 MG/ML IJ SOLN
1.0000 mg | Freq: Three times a day (TID) | INTRAMUSCULAR | Status: DC | PRN
  Administered 2018-02-26: 1 mg via INTRAVENOUS
  Filled 2018-02-25: qty 1

## 2018-02-25 MED ORDER — IPRATROPIUM-ALBUTEROL 0.5-2.5 (3) MG/3ML IN SOLN
3.0000 mL | Freq: Three times a day (TID) | RESPIRATORY_TRACT | Status: DC
  Administered 2018-02-25 – 2018-03-03 (×13): 3 mL via RESPIRATORY_TRACT
  Filled 2018-02-25 (×16): qty 3

## 2018-02-25 MED ORDER — ALBUTEROL SULFATE (2.5 MG/3ML) 0.083% IN NEBU
2.5000 mg | INHALATION_SOLUTION | RESPIRATORY_TRACT | Status: DC | PRN
  Administered 2018-02-25: 2.5 mg via RESPIRATORY_TRACT
  Filled 2018-02-25 (×2): qty 3

## 2018-02-25 NOTE — Progress Notes (Signed)
[facility] NEPHROLOGY PROGRESS NOTE  Assessment/ Plan: Pt is a 77 y.o. yo male  ESRD on HD, afib p/w SOB and respiratory distress.  Assessment/Plan:  #Respiratory distress due to pulmonary edema: Improving # ESRD:MWF, plan for dialysis today.  3 L UF as tolerated, 2k bath.  Serum potassium noted to be 5.5 today. # Anemia: Hemoglobin 9.9 # Secondary hyperparathyroidism: Calcium 8.3.  Continue PhosLo, Auryxia, calcitriol. # HTN/volume: Monitor blood pressure.  On metoprolol. #Confusion/agitation: Per primary.  Subjective: Seen and examined at bedside.  Patient was confused.  Review of systems limited Objective Vital signs in last 24 hours: Vitals:   02/25/18 0402 02/25/18 0738 02/25/18 0943 02/25/18 1213  BP: 110/85 112/81 112/86 121/80  Pulse: 97 93 93 88  Resp: (!) Temp: 98.5 F (36.9 C) 99.3 F (37.4 C)  98.5 F (36.9 C)  TempSrc: Oral Axillary  Axillary  SpO2: 100%  96% 100%  Weight:       Weight change:   Intake/Output Summary (Last 24 hours) at 02/25/2018 1317 Last data filed at 02/25/2018 0300 Gross per 24 hour  Intake 400 ml  Output -  Net 400 ml       Labs: Basic Metabolic Panel: Recent Labs  Lab 02/23/18 2055 02/24/18 0631 02/25/18 0351  NA 144 140 139  K 6.7* 3.9 5.5*  CL 98* 95* 96*  CO2 GLUCOSE 62* 82 108*  BUN 60* 24* 32*  CREATININE 14.45* 7.17* 8.77*  CALCIUM 8.6* 8.6* 8.3*   Liver Function Tests: No results for input(s): AST, ALT, ALKPHOS, BILITOT, PROT, ALBUMIN in the last 168 hours. No results for input(s): LIPASE, AMYLASE in the last 168 hours. No results for input(s): AMMONIA in the last 168 hours. CBC: Recent Labs  Lab 02/20/18 1026 02/20/18 1033 02/23/18 2055 02/24/18 0631  WBC 4.8  --  5.6 6.9  NEUTROABS 3.6  --   --   --   HGB 9.5* 10.2* 10.2* 9.9*  HCT 30.6* 30.0* 32.6* 31.7*  MCV 105.2*  --  103.5* 102.3*  PLT 102*  --  109* 105*   Cardiac Enzymes: Recent Labs  Lab  02/23/18 2055  TROPONINI 0.28*   CBG: Recent Labs  Lab 02/24/18 2030 02/25/18 0006 02/25/18 0359 02/25/18 0815 02/25/18 1241  GLUCAP 106* 120* 132* 127* 111*    Iron Studies: No results for input(s): IRON, TIBC, TRANSFERRIN, FERRITIN in the last 72 hours. Studies/Results: Dg Chest Port 1 View  Result Date: 02/23/2018 CLINICAL DATA:  Acute onset of shortness of breath. EXAM: PORTABLE CHEST 1 VIEW COMPARISON:  Chest radiograph performed 02/20/2018 FINDINGS: A small left pleural effusion is noted. Mild left basilar airspace opacity may reflect atelectasis or possibly mild asymmetric interstitial edema. No pneumothorax is seen, though the lung apices are partially obscured by the patient's head. The cardiomediastinal silhouette is borderline normal in size. No acute osseous abnormalities are seen. IMPRESSION: Small left pleural effusion noted. Mild left basilar airspace opacity may reflect atelectasis or possibly mild asymmetric interstitial edema. Electronically Signed   By: Roanna Raider M.D.   On: 02/23/2018 21:38    Medications: Infusions: . sodium chloride    . dextrose 50 mL/hr at 02/25/18 1305    Scheduled Medications: . ammonium lactate   Topical Daily  . calcitRIOL  1 mcg Oral Q M,W,F-HD  . calcium acetate  2,001 mg Oral TID WC  . ferric citrate  420 mg Oral TID WC  . heparin  5,000 Units Subcutaneous Q8H  . ipratropium-albuterol  3 mL Nebulization TID  . metoprolol succinate  12.5 mg Oral Daily  . nicotine  21 mg Transdermal Daily  . pantoprazole  40 mg Oral Daily  . triamcinolone cream   Topical Daily    have reviewed scheduled and prn medications.  Physical Exam: General:NAD, confused elderly male Heart:RRR, s1s2 nl Lungs:clear b/l, no cracjle Abdomen:soft, Non-tender, non-distended Extremities:No edema Dialysis Access: Right upper extremity AV fistula has good thrill and bruit  Makayla Lanter Prasad Aydin Cavalieri 02/25/2018,1:17 PM  LOS: 2 days

## 2018-02-25 NOTE — Plan of Care (Signed)
  Problem: Skin Integrity: Goal: Risk for impaired skin integrity will decrease Outcome: Progressing   

## 2018-02-25 NOTE — Evaluation (Signed)
Physical Therapy Evaluation Patient Details Name: KIAAN OVERHOLSER MRN: [id number] DOB: 20-Jun-1941 Today's Date: 02/25/2018   History of Present Illness  The patient is a 77 y.o. year-old with hx AL amyloid, ESRD on HD, atrial fib who presents to ED with sob and resp distress.  Was on bipap briefly in ed but didn't tolerate well. Pt missed WEd and Fri Dialysis treatments last week and had been leaving early in treatments prior to that.    Clinical Impression  Pt admitted with above diagnosis. Pt currently with functional limitations due to the deficits listed below (see PT Problem List). Pt was unable to arouse even with stimulation.  Nurse informed PT that pt had Ativan due to pt being restless and pulling out lines.  Will reattempt at later date as pt unable to follow commands.  Will most likely need SNF with therapy.  Will follow acutely.  Pt will benefit from skilled PT to increase their independence and safety with mobility to allow discharge to the venue listed below.      Follow Up Recommendations SNF;Supervision/Assistance - 24 hour    Equipment Recommendations  Other (comment)(TBA)    Recommendations for Other Services       Precautions / Restrictions Precautions Precautions: Fall Restrictions Weight Bearing Restrictions: No      Mobility  Bed Mobility Overal bed mobility: Needs Assistance Bed Mobility: Rolling;Sidelying to Sit Rolling: Total assist;+2 for physical assistance Sidelying to sit: Total assist;+2 for physical assistance       General bed mobility comments: Pt too lethargic to arouse.  NT came in and told PT that pt had had Ativan.  could not arouse pt and nurse states he has been agitated and not following any commands.   Transfers                    Ambulation/Gait                Stairs            Wheelchair Mobility    Modified Rankin (Stroke Patients Only)       Balance Overall balance assessment: Needs assistance      Sitting balance - Comments: unable                                     Pertinent Vitals/Pain Pain Assessment: No/denies pain    Home Living Family/patient expects to be discharged to:: Private residence Living Arrangements: Spouse/significant other;Children Available Help at Discharge: Personal care attendant;Available PRN/intermittently Type of Home: House Home Access: Ramped entrance     Home Layout: One level Home Equipment: Walker - 2 wheels;Cane - single point;Bedside commode;Shower seat;Wheelchair - manual Additional Comments: per daughter, his wife is WC bound and patient himself fluctuates during the week; he can move better at the beginning of the week but by the end of the week due to fatigue from dialysis is in a wheelchair himself.     Prior Function Level of Independence: Independent with assistive device(s);Needs assistance   Gait / Transfers Assistance Needed: uses cane and walker   ADL's / Homemaking Assistance Needed: States he is independent with ADL's and helps his wife with her care  Comments: Patient does not drive.  His daughter provides transportation.  Per notes in chart from April admission, daughter has been trying to get pt into an adult day program.      Hand  Dominance   Dominant Hand: Right    Extremity/Trunk Assessment   Upper Extremity Assessment Upper Extremity Assessment: Defer to OT evaluation    Lower Extremity Assessment Lower Extremity Assessment: Generalized weakness       Communication   Communication: No difficulties  Cognition Arousal/Alertness: Lethargic;Suspect due to medications(Nurse had given Ativan ) Behavior During Therapy: Flat affect Overall Cognitive Status: History of cognitive impairments - at baseline Area of Impairment: Problem solving;Safety/judgement;Orientation;Following commands                 Orientation Level: Disoriented to;Place;Time;Situation;Person       Safety/Judgement:  Decreased awareness of safety;Decreased awareness of deficits   Problem Solving: Slow processing;Decreased initiation;Difficulty sequencing;Requires verbal cues;Requires tactile cues General Comments: Not following commands      General Comments General comments (skin integrity, edema, etc.): Nurse informed PT he had to give pt Ativan as he has not been following commands and he has pulled his lines out. Of note, on arrival, pt desat to 77% with good waveform and pt on 7LO2.  Nurse informed and called RT.     Exercises     Assessment/Plan    PT Assessment Patient needs continued PT services  PT Problem List Decreased activity tolerance;Decreased balance;Decreased mobility;Decreased knowledge of use of DME;Decreased safety awareness;Decreased knowledge of precautions;Decreased cognition       PT Treatment Interventions Gait training;DME instruction;Functional mobility training;Therapeutic activities;Therapeutic exercise;Balance training;Patient/family education;Cognitive remediation    PT Goals (Current goals can be found in the Care Plan section)  Acute Rehab PT Goals Patient Stated Goal: unable to state PT Goal Formulation: Patient unable to participate in goal setting Time For Goal Achievement: 03/11/18 Potential to Achieve Goals: Good    Frequency Min 2X/week   Barriers to discharge Decreased caregiver support      Co-evaluation               AM-PAC PT "6 Clicks" Daily Activity  Outcome Measure Difficulty turning over in bed (including adjusting bedclothes, sheets and blankets)?: Unable Difficulty moving from lying on back to sitting on the side of the bed? : Unable Difficulty sitting down on and standing up from a chair with arms (e.g., wheelchair, bedside commode, etc,.)?: Unable Help needed moving to and from a bed to chair (including a wheelchair)?: Total Help needed walking in hospital room?: Total Help needed climbing 3-5 steps with a railing? : Total 6  Click Score: 6    End of Session Equipment Utilized During Treatment: Oxygen Activity Tolerance: Patient limited by fatigue;Patient limited by lethargy Patient left: in bed;with call bell/phone within reach;with bed alarm set Nurse Communication: Mobility status PT Visit Diagnosis: Muscle weakness (generalized) (M62.81)    Time: 1610-9604 PT Time Calculation (min) (ACUTE ONLY): 12 min   Charges:   PT Evaluation $PT Eval Moderate Complexity: 1 Mod     PT G Codes:        Milla Wahlberg,PT Acute Rehabilitation [phone] (pager)   Berline Lopes 02/25/2018, 1:02 PM

## 2018-02-25 NOTE — Progress Notes (Addendum)
[place] TEAM 1 - Stepdown/ICU TEAM  Shane [name]  QMV:[id number] DOB: August 03, 1941 DOA: 02/23/2018 PCP: [name], MD    Brief Narrative:  77 y.o. 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  In the ED he was found to be grossly volume overloaded and hyperkalemic.  Significant Events: 5/18 admit   Subjective: The patient is alert today and calm but confused.  He does not answer most of my questions and simply stares at me.  When he does attempt to answer his speech is unintelligible.  There is no family present at time of visit.  His nurse reports that his son was here earlier and states that he is very close to his baseline.  Physical therapy suggest that he would be most appropriately discharged to a skilled nursing facility for 24-hour supervision and assistance.  Assessment & Plan:  ESRD w/ HD noncompliance > volume overload/pulmonary edema/acute hypoxic resp failure Rapidly improved with dialysis treatment - Nephrology following - will be receiving dialysis late today therefore will not attempt to discharge home today  Dementia - agitation Records indicate a hx of severe agitation when admitted to hospital - reportedly has 24hr care at home - avoid sedation as able but given severity of and agitation may be required intermittently - tentative plan to discharge home to familiar surroundings after dialysis now on hold due to late hour of dialysis scheduled for today as well as recommendation from physical therapy for SNF placement - we'll discuss placement with family tomorrow - if family/patient are not in favor of placement we will entertain discharge 5/21  HTN Well controlled  Hypoglycemia  Attempt to wean off D10 gtt and follow CBG  Chronic Afib w/ acute RVR not a candidate for anticoagulation given noncompliance as well as  dementia - in NSR today   DVT prophylaxis: SQ heparin Code Status: FULL CODE Family Communication: no family present at time of exam  Disposition Plan: as discussed above   Consultants:  Nephrology  Antimicrobials:  none   Objective: Blood pressure 121/80, pulse 88, temperature 98.5 F (36.9 C), temperature source Axillary, resp. rate 18, weight 68.5 kg (151 lb 0.2 oz), SpO2 100 %.  Intake/Output Summary (Last 24 hours) at 02/25/2018 1531 Last data filed at 02/25/2018 1500 Gross per 24 hour  Intake 850 ml  Output -  Net 850 ml   Filed Weights   02/24/18 0510  Weight: 68.5 kg (151 lb 0.2 oz)    Examination: General: No acute respiratory distress - confused but alert  Lungs: Clear to auscultation bilaterally  Cardiovascular: RRR - no M  Abdomen: NT/ND, soft, bowel sounds positive, no rebound Extremities: No signif edema bilateral lower extremities  CBC: Recent Labs  Lab 02/20/18 1026 02/20/18 1033 02/23/18 2055 02/24/18 0631  WBC 4.8  --  5.6 6.9  NEUTROABS 3.6  --   --   --   HGB 9.5* 10.2* 10.2* 9.9*  HCT 30.6* 30.0* 32.6* 31.7*  MCV 105.2*  --  103.5* 102.3*  PLT 102*  --  109* 105*   Basic Metabolic Panel: Recent Labs  Lab 02/23/18 2055 02/24/18 0631 02/25/18 0351  NA 144 140 139  K 6.7* 3.9 5.5*  CL 98* 95* 96*  CO2 GLUCOSE 62* 82 108*  BUN 60* 24* 32*  CREATININE 14.45* 7.17* 8.77*  CALCIUM 8.6* 8.6* 8.3*  GFR: Estimated Creatinine Clearance: 6.2 mL/min (A) (by C-G formula based on SCr of 8.77 mg/dL (H)).  Liver Function Tests: No results for input(s): AST, ALT, ALKPHOS, BILITOT, PROT, ALBUMIN in the last 168 hours. No results for input(s): LIPASE, AMYLASE in the last 168 hours. No results for input(s): AMMONIA in the last 168 hours.  Cardiac Enzymes: Recent Labs  Lab 02/23/18 2055  TROPONINI 0.28*   CBG: Recent Labs  Lab 02/24/18 2030 02/25/18 0006 02/25/18 0359 02/25/18 0815 02/25/18 1241  GLUCAP 106* 120* 132* 127*  111*    Recent Results (from the past 240 hour(s))  MRSA PCR Screening     Status: None   Collection Time: 02/24/18  6:16 AM  Result Value Ref Range Status   MRSA by PCR NEGATIVE NEGATIVE Final    Comment:        The GeneXpert MRSA Assay (FDA approved for NASAL specimens only), is one component of a comprehensive MRSA colonization surveillance program. It is not intended to diagnose MRSA infection nor to guide or monitor treatment for MRSA infections. Performed at [facility], [address] [place], [place] [address]      Scheduled Meds: . ammonium lactate   Topical Daily  . calcitRIOL  1 mcg Oral Q M,W,F-HD  . calcium acetate  2,001 mg Oral TID WC  . ferric citrate  420 mg Oral TID WC  . heparin  5,000 Units Subcutaneous Q8H  . ipratropium-albuterol  3 mL Nebulization TID  . metoprolol succinate  12.5 mg Oral Daily  . nicotine  21 mg Transdermal Daily  . pantoprazole  40 mg Oral Daily  . triamcinolone cream   Topical Daily     LOS: 2 days   Lonia Blood, MD [facility] Office  [phone] Pager - Text Page per Amion as per below:  On-Call/Text Page:      [email or web address]      password [email or web address]  If 7PM-7AM, please contact night-coverage [email or web address] Password [facility] 02/25/2018, 3:31 PM

## 2018-02-25 NOTE — Plan of Care (Signed)
  Problem: Safety: Goal: Ability to remain free from injury will improve Outcome: Progressing   Problem: Skin Integrity: Goal: Risk for impaired skin integrity will decrease 02/25/2018 0343 by Ruel Favors, RN Outcome: Progressing 02/25/2018 0336 by Ruel Favors, RN Outcome: Progressing

## 2018-02-26 DIAGNOSIS — F0391 Unspecified dementia with behavioral disturbance: Secondary | ICD-10-CM

## 2018-02-26 DIAGNOSIS — R0902 Hypoxemia: Secondary | ICD-10-CM

## 2018-02-26 DIAGNOSIS — R0602 Shortness of breath: Secondary | ICD-10-CM

## 2018-02-26 DIAGNOSIS — F03918 Unspecified dementia, unspecified severity, with other behavioral disturbance: Secondary | ICD-10-CM

## 2018-02-26 DIAGNOSIS — R410 Disorientation, unspecified: Secondary | ICD-10-CM

## 2018-02-26 LAB — GLUCOSE, CAPILLARY
GLUCOSE-CAPILLARY: 79 mg/dL (ref 65–99)
GLUCOSE-CAPILLARY: 86 mg/dL (ref 65–99)
Glucose-Capillary: 105 mg/dL — ABNORMAL HIGH (ref 65–99)
Glucose-Capillary: 73 mg/dL (ref 65–99)
Glucose-Capillary: 82 mg/dL (ref 65–99)

## 2018-02-26 LAB — RENAL FUNCTION PANEL
ALBUMIN: 3.1 g/dL — AB (ref 3.5–5.0)
ANION GAP: 15 (ref 5–15)
BUN: 37 mg/dL — ABNORMAL HIGH (ref 6–20)
CHLORIDE: 95 mmol/L — AB (ref 101–111)
CO2: 28 mmol/L (ref 22–32)
Calcium: 8.4 mg/dL — ABNORMAL LOW (ref 8.9–10.3)
Creatinine, Ser: 10.41 mg/dL — ABNORMAL HIGH (ref 0.61–1.24)
GFR calc Af Amer: 5 mL/min — ABNORMAL LOW (ref >60)
GFR calc non Af Amer: 4 mL/min — ABNORMAL LOW (ref >60)
GLUCOSE: 97 mg/dL (ref 65–99)
PHOSPHORUS: 8.2 mg/dL — AB (ref 2.5–4.6)
POTASSIUM: 4.4 mmol/L (ref 3.5–5.1)
Sodium: 138 mmol/L (ref 135–145)

## 2018-02-26 LAB — CBC
HEMATOCRIT: 31.2 % — AB (ref 39.0–52.0)
HEMOGLOBIN: 9.8 g/dL — AB (ref 13.0–17.0)
MCH: 31.9 pg (ref 26.0–34.0)
MCHC: 31.4 g/dL (ref 30.0–36.0)
MCV: 101.6 fL — AB (ref 78.0–100.0)
Platelets: 98 10*3/uL — ABNORMAL LOW (ref 150–400)
RBC: 3.07 MIL/uL — ABNORMAL LOW (ref 4.22–5.81)
RDW: 14 % (ref 11.5–15.5)
WBC: 5.2 10*3/uL (ref 4.0–10.5)

## 2018-02-26 LAB — RPR: RPR: NONREACTIVE

## 2018-02-26 LAB — HEPATITIS B SURFACE ANTIGEN: HEP B S AG: NEGATIVE

## 2018-02-26 LAB — VITAMIN B12: VITAMIN B 12: 993 pg/mL — AB (ref 180–914)

## 2018-02-26 LAB — FOLATE: FOLATE: 7.2 ng/mL (ref >5.9)

## 2018-02-26 LAB — AMMONIA: Ammonia: 17 umol/L (ref 9–35)

## 2018-02-26 MED ORDER — LORAZEPAM 2 MG/ML IJ SOLN
0.5000 mg | Freq: Three times a day (TID) | INTRAMUSCULAR | Status: DC | PRN
  Administered 2018-02-27 – 2018-03-09 (×13): 1 mg via INTRAVENOUS
  Filled 2018-02-26 (×12): qty 1

## 2018-02-26 MED ORDER — FERRIC CITRATE 1 GM 210 MG(FE) PO TABS
630.0000 mg | ORAL_TABLET | Freq: Three times a day (TID) | ORAL | Status: DC
  Administered 2018-02-27 – 2018-03-05 (×14): 630 mg via ORAL
  Filled 2018-02-26 (×26): qty 3

## 2018-02-26 NOTE — Progress Notes (Signed)
PT Cancellation Note  Patient Details Name: Shane [name] MRN: [id number] DOB: 30-Nov-1940   Cancelled Treatment:    Reason Eval/Treat Not Completed: Patient at procedure or test/unavailable(Pt in HD.  Will check back as able in pm. )Per nurse, pt had to have Ativan prior to dialysis as he had taken off gown, O2 and was agitated.  Continue to recommend 24 hour care at home or SNF.    Berline Lopes 02/26/2018, 7:44 AM [facility] Acute Rehabilitation [phone] (pager)

## 2018-02-26 NOTE — Progress Notes (Signed)
[facility] NEPHROLOGY PROGRESS NOTE  Assessment/ Plan: Pt is a 77 y.o. yo male  ESRD on HD, afib p/w SOB and respiratory distress.  Assessment/Plan:  #Respiratory distress due to pulmonary edema: Improving # ESRD:MWF.  Receiving dialysis today because of busy schedule yesterday.  Tolerating well.  Plan for next dialysis tomorrow to resume MWF schedule.    # Anemia: Hemoglobin stable. # Secondary hyperparathyroidism: Phosphorus level elevated.  Continue PhosLo and I will increase the dose of Auryxia.  Unknown if patient is taking binders with the food because of his confusion.  On calcitriol. # HTN/volume: Monitor blood pressure.  On metoprolol. #Confusion/agitation: Per primary.  Subjective: Seen and examined at dialysis unit.  He is confused.  Review of systems limited. Objective Vital signs in last 24 hours: Vitals:   02/26/18 0744 02/26/18 0750 02/26/18 0800 02/26/18 0830  BP: 117/79 116/75 121/78 104/72  Pulse: 69 64 (!) 59 (!) 49  Resp: 15 16    Temp:      TempSrc:      SpO2:      Weight:       Weight change:   Intake/Output Summary (Last 24 hours) at 02/26/2018 0859 Last data filed at 02/26/2018 1610 Gross per 24 hour  Intake 1000 ml  Output -  Net 1000 ml       Labs: Basic Metabolic Panel: Recent Labs  Lab 02/24/18 0631 02/25/18 0351 02/26/18 0800  NA 140 139 138  K 3.9 5.5* 4.4  CL 95* 96* 95*  CO2 GLUCOSE 82 108* 97  BUN 24* 32* 37*  CREATININE 7.17* 8.77* 10.41*  CALCIUM 8.6* 8.3* 8.4*  PHOS  --   --  8.2*   Liver Function Tests: Recent Labs  Lab 02/26/18 0800  ALBUMIN 3.1*   No results for input(s): LIPASE, AMYLASE in the last 168 hours. Recent Labs  Lab 02/26/18 0719  AMMONIA 17   CBC: Recent Labs  Lab 02/20/18 1026  02/23/18 2055 02/24/18 0631 02/26/18 0759  WBC 4.8  --  5.6 6.9 5.2  NEUTROABS 3.6  --   --   --   --   HGB 9.5*   < > 10.2* 9.9* 9.8*  HCT 30.6*   < > 32.6* 31.7* 31.2*  MCV 105.2*  --   103.5* 102.3* 101.6*  PLT 102*  --  109* 105* PENDING   < > = values in this interval not displayed.   Cardiac Enzymes: Recent Labs  Lab 02/23/18 2055  TROPONINI 0.28*   CBG: Recent Labs  Lab 02/25/18 1241 02/25/18 1644 02/25/18 2125 02/26/18 0013 02/26/18 0418  GLUCAP 111* 96 79 105* 82    Iron Studies: No results for input(s): IRON, TIBC, TRANSFERRIN, FERRITIN in the last 72 hours. Studies/Results: No results found.  Medications: Infusions: . dextrose 30 mL/hr at 02/26/18 9604    Scheduled Medications: . ammonium lactate   Topical Daily  . calcitRIOL  1 mcg Oral Q M,W,F-HD  . calcium acetate  2,001 mg Oral TID WC  . ferric citrate  420 mg Oral TID WC  . heparin  5,000 Units Subcutaneous Q8H  . ipratropium-albuterol  3 mL Nebulization TID  . metoprolol succinate  12.5 mg Oral Daily  . nicotine  21 mg Transdermal Daily  . pantoprazole  40 mg Oral Daily  . triamcinolone cream   Topical Daily    have reviewed scheduled and prn medications.  Physical Exam: General: Confused elderly male, not in distress Heart:RRR,  s1s2 nl Lungs:clear bilateral, no crackles Abdomen:soft, Non-tender, non-distended Extremities:No edema Dialysis Access: Right upper extremity AV fistula has good thrill and bruit  Dron Prasad Bhandari 02/26/2018,8:59 AM  LOS: 3 days

## 2018-02-26 NOTE — Progress Notes (Signed)
Dr. Marisue Humble rescheduled 5/20 HD tx to 5/21, orders modified in Epic, Floor notified

## 2018-02-26 NOTE — Progress Notes (Signed)
[facility] TEAM 1 - Stepdown/ICU TEAM  CHAMPION CORALES  ZOX:[id number] DOB: 1940-11-27 DOA: 02/23/2018 PCP: [name], MD    Brief Narrative:  77 y.o. 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  In the ED he was found to be grossly volume overloaded and hyperkalemic.  Significant Events: 5/18 admit   Subjective: The patient is seen in HD.  He is sedate having required ativan in order to participate in HD.  He is not able to provide a reliable hx though he is easily awakened.  He tells me he is in [place], but can't tell me where or why.  He can not name the year.    Assessment & Plan:  ESRD w/ HD noncompliance > volume overload/pulmonary edema/acute hypoxic resp failure Rapidly improved with dialysis treatment - Nephrology following - will likely be able to wean to RA after HD today   Dementia - agitation Records indicate a hx of severe agitation when admitted to hospital - reportedly has 24hr care at home - work up for common acute reversible issues has been negative - avoid sedation as able but given severity of and agitation may be required intermittently - it now appears cleat that he is not suitable for d/c home - he will require 24hr support and supervision - this will lead to improved HD compliance as well   HTN Well controlled w/ full HD compliance   Hypoglycemia  Stop D10 today and follow - appears to be resolved   Chronic Afib w/ acute RVR not a candidate for anticoagulation given noncompliance as well as dementia - in NSR again today   DVT prophylaxis: SQ heparin Code Status: FULL CODE Family Communication: no family present at time of exam  Disposition Plan: needs SNF placement - transfer to tele bed - wean O2 as able    Consultants:  Nephrology  Antimicrobials:  none   Objective: Blood pressure 113/85, pulse (!)  59, temperature (!) 97.5 F (36.4 C), temperature source Oral, resp. rate 12, weight 63.5 kg (139 lb 15.9 oz), SpO2 99 %.  Intake/Output Summary (Last 24 hours) at 02/26/2018 1653 Last data filed at 02/26/2018 1144 Gross per 24 hour  Intake 650 ml  Output 3000 ml  Net -2350 ml   Filed Weights   02/24/18 0510 02/26/18 0735 02/26/18 1144  Weight: 68.5 kg (151 lb 0.2 oz) 66.8 kg (147 lb 4.3 oz) 63.5 kg (139 lb 15.9 oz)    Examination: General: No acute respiratory distress Lungs: Clear to auscultation bilaterally - no wheezing  Cardiovascular: RRR w/o M  Abdomen: NT/ND, soft, bowel sounds positive, no rebound Extremities: No edema B LE   CBC: Recent Labs  Lab 02/20/18 1026  02/23/18 2055 02/24/18 0631 02/26/18 0759  WBC 4.8  --  5.6 6.9 5.2  NEUTROABS 3.6  --   --   --   --   HGB 9.5*   < > 10.2* 9.9* 9.8*  HCT 30.6*   < > 32.6* 31.7* 31.2*  MCV 105.2*  --  103.5* 102.3* 101.6*  PLT 102*  --  109* 105* 98*   < > = values in this interval not displayed.   Basic Metabolic Panel: Recent Labs  Lab 02/24/18 0631 02/25/18 0351 02/26/18 0800  NA 140 139 138  K 3.9 5.5* 4.4  CL 95* 96* 95*  CO2 27 24 28  GLUCOSE 82 108* 97  BUN 24* 32* 37*  CREATININE 7.17* 8.77* 10.41*  CALCIUM 8.6* 8.3* 8.4*  PHOS  --   --  8.2*   GFR: Estimated Creatinine Clearance: 5.3 mL/min (A) (by C-G formula based on SCr of 10.41 mg/dL (H)).  Liver Function Tests: Recent Labs  Lab 02/26/18 0800  ALBUMIN 3.1*    Recent Labs  Lab 02/26/18 0719  AMMONIA 17    Cardiac Enzymes: Recent Labs  Lab 02/23/18 2055  TROPONINI 0.28*   CBG: Recent Labs  Lab 02/25/18 1644 02/25/18 2125 02/26/18 0013 02/26/18 0418 02/26/18 1613  GLUCAP 96 79 105* 82 73    Recent Results (from the past 240 hour(s))  MRSA PCR Screening     Status: None   Collection Time: 02/24/18  6:16 AM  Result Value Ref Range Status   MRSA by PCR NEGATIVE NEGATIVE Final    Comment:        The GeneXpert MRSA  Assay (FDA approved for NASAL specimens only), is one component of a comprehensive MRSA colonization surveillance program. It is not intended to diagnose MRSA infection nor to guide or monitor treatment for MRSA infections. Performed at [facility], [address] [place], [place] [address]      Scheduled Meds: . ammonium lactate   Topical Daily  . calcitRIOL  1 mcg Oral Q M,W,F-HD  . calcium acetate  2,001 mg Oral TID WC  . ferric citrate  630 mg Oral TID WC  . heparin  5,000 Units Subcutaneous Q8H  . ipratropium-albuterol  3 mL Nebulization TID  . metoprolol succinate  12.5 mg Oral Daily  . nicotine  21 mg Transdermal Daily  . pantoprazole  40 mg Oral Daily  . triamcinolone cream   Topical Daily     LOS: 3 days   Lonia Blood, MD [facility] Office  [phone] Pager - Text Page per Amion as per below:  On-Call/Text Page:      [email or web address]      password [email or web address]  If 7PM-7AM, please contact night-coverage [email or web address] Password [email or web address] 02/26/2018, 4:53 PM

## 2018-02-27 LAB — GLUCOSE, CAPILLARY
GLUCOSE-CAPILLARY: 82 mg/dL (ref 65–99)
GLUCOSE-CAPILLARY: 100 mg/dL — AB (ref 65–99)
GLUCOSE-CAPILLARY: 90 mg/dL (ref 65–99)
Glucose-Capillary: 95 mg/dL (ref 65–99)
Glucose-Capillary: 82 mg/dL (ref 65–99)
Glucose-Capillary: 77 mg/dL (ref 65–99)

## 2018-02-27 LAB — RENAL FUNCTION PANEL
ANION GAP: 12 (ref 5–15)
Albumin: 3 g/dL — ABNORMAL LOW (ref 3.5–5.0)
BUN: 21 mg/dL — ABNORMAL HIGH (ref 6–20)
CALCIUM: 8.5 mg/dL — AB (ref 8.9–10.3)
CHLORIDE: 97 mmol/L — AB (ref 101–111)
CO2: 31 mmol/L (ref 22–32)
Creatinine, Ser: 7.53 mg/dL — ABNORMAL HIGH (ref 0.61–1.24)
GFR calc non Af Amer: 6 mL/min — ABNORMAL LOW (ref >60)
GFR, EST AFRICAN AMERICAN: 7 mL/min — AB (ref >60)
GLUCOSE: 96 mg/dL (ref 65–99)
POTASSIUM: 4.1 mmol/L (ref 3.5–5.1)
Phosphorus: 6.1 mg/dL — ABNORMAL HIGH (ref 2.5–4.6)
Sodium: 140 mmol/L (ref 135–145)

## 2018-02-27 LAB — CBC
HEMATOCRIT: 33.1 % — AB (ref 39.0–52.0)
HEMOGLOBIN: 10.5 g/dL — AB (ref 13.0–17.0)
MCH: 32.4 pg (ref 26.0–34.0)
MCHC: 31.7 g/dL (ref 30.0–36.0)
MCV: 102.2 fL — ABNORMAL HIGH (ref 78.0–100.0)
Platelets: 121 10*3/uL — ABNORMAL LOW (ref 150–400)
RBC: 3.24 MIL/uL — AB (ref 4.22–5.81)
RDW: 14.1 % (ref 11.5–15.5)
WBC: 7.2 10*3/uL (ref 4.0–10.5)

## 2018-02-27 MED ORDER — SODIUM CHLORIDE 0.9 % IV SOLN: 100.0000 mL | INTRAVENOUS | Status: DC | PRN

## 2018-02-27 MED ORDER — HEPARIN SODIUM (PORCINE) 1000 UNIT/ML DIALYSIS: 1000.0000 [IU] | INTRAMUSCULAR | Status: DC | PRN

## 2018-02-27 MED ORDER — LIDOCAINE HCL (PF) 1 % IJ SOLN: 5.0000 mL | INTRAMUSCULAR | Status: DC | PRN

## 2018-02-27 MED ORDER — PENTAFLUOROPROP-TETRAFLUOROETH EX AERO: 1.0000 "application " | INHALATION_SPRAY | CUTANEOUS | Status: DC | PRN

## 2018-02-27 MED ORDER — LIDOCAINE-PRILOCAINE 2.5-2.5 % EX CREA: 1.0000 "application " | TOPICAL_CREAM | CUTANEOUS | Status: DC | PRN

## 2018-02-27 MED ORDER — ALTEPLASE 2 MG IJ SOLR: 2.0000 mg | Freq: Once | INTRAMUSCULAR | Status: DC | PRN

## 2018-02-27 NOTE — Progress Notes (Signed)
Patient declined treatment at this time, just kept saying he just wants to go home. No distress noted at this time.

## 2018-02-27 NOTE — Progress Notes (Signed)
Wess Botts, RN called and informed that the HD tx for the pt has been moved to 02/28/2018

## 2018-02-27 NOTE — Progress Notes (Signed)
Pt refused medications.  Repeatedly removing telemetry box. 50% intake of breakfast.  Noted coughing with thin liquid and stopped.  No difficultly noted with food.  Dentures in place.  ST eval requested. Eugenio Hoes, RN

## 2018-02-27 NOTE — Evaluation (Signed)
Occupational Therapy Evaluation Patient Details Name: Shane [name] MRN: [id number] DOB: 27-Apr-1941 Today's Date: 02/27/2018    History of Present Illness The patient is a 77 y.o. year-old with hx AL amyloid, ESRD on HD, atrial fib who presents to ED with sob and resp distress.  Was on bipap briefly in ed but didn't tolerate well. Pt missed WEd and Fri Dialysis treatments last week and had been leaving early in treatments prior to that.     Clinical Impression   Per chart review, pt was living with his children and was performing BADLs PTA. Pt currently requiring Max A for all ADLs and functional mobility. Pt presenting with poor safety awareness, cognition, balance, strength, and vision. Pt confused throughout session asking at one point "where is that baby? I hear a baby." Pt also stating "you go to the store and get my some orange juice. I'll just sit right here." Pt very unsafe during functional mobility and attempting to sit prematurely requiring Total A to safely descend to chair. Pt would benefit from further acute OT to facilitate safe dc. Recommend dc to SNF for further OT to optimize safety, independence with ADLs, and return to PLOF.      Follow Up Recommendations  SNF    Equipment Recommendations  Other (comment)(Defer to next venue)    Recommendations for Other Services PT consult;Speech consult     Precautions / Restrictions Precautions Precautions: Fall Restrictions Weight Bearing Restrictions: No      Mobility Bed Mobility Overal bed mobility: Needs Assistance Bed Mobility: Supine to Sit;Sit to Supine     Supine to sit: Mod assist Sit to supine: Total assist;+2 for physical assistance   General bed mobility comments: Pt bringing BLEs over EOB and required Mod A to elevate trunk. Total A +2 to return to bed due to confusion and poor following of commands.   Transfers Overall transfer level: Needs assistance Equipment used: Rolling walker (2  wheeled) Transfers: Sit to/from Stand Sit to Stand: Max assist         General transfer comment: Mod A for power up into standing and then Max A to maintain balance. posterior lean noted. Pt very unsafe in standing and presenting with poor balance and cognition    Balance Overall balance assessment: Needs assistance   Sitting balance-Leahy Scale: Fair Sitting balance - Comments: Able to maintain static sitting   Standing balance support: Bilateral upper extremity supported;During functional activity Standing balance-Leahy Scale: Poor Standing balance comment: reliant on physical A due to posterior lean                           ADL either performed or assessed with clinical judgement   ADL Overall ADL's : Needs assistance/impaired Eating/Feeding: Maximal assistance;Bed level Eating/Feeding Details (indicate cue type and reason): Requiring Max A to place cup in hands.  Grooming: Wash/dry face;Maximal assistance;Bed level Grooming Details (indicate cue type and reason): Pt requiring Max cues to initating washign his face. Pt reaching for wash clothe and undershooting. Pt attempting several times but missing. Requiring Max A to place wash clothe in his hands.  Upper Body Bathing: Maximal assistance;Bed level;Sitting   Lower Body Bathing: Maximal assistance;Bed level   Upper Body Dressing : Maximal assistance;Bed level;Sitting   Lower Body Dressing: Maximal assistance;Bed level   Toilet Transfer: Maximal assistance;Ambulation;RW;Total assistance Toilet Transfer Details (indicate cue type and reason): Pt performing fucntional mobility to chair with Max A and RW. Pt presenting  with signficant posterior lean. Pt very unsafe during mobility and attempting to sit prematurely. Required Total A to manage hips and turn toward chair.  Toileting- Clothing Manipulation and Hygiene: Total assistance;Bed level Toileting - Clothing Manipulation Details (indicate cue type and reason):  Required total A to perform peri care at bed level. Pt with poor awareness of hygiene needs and unable to follow commands for peri care     Functional mobility during ADLs: Maximal assistance;Rolling walker General ADL Comments: Pt requiring Max A for all ADLs due to poor cognition and safety. Pt performing functional mobiltiy using RW around room. Pt then attempts to sit mid walk and required total A to sit into a chair safely. Pt presenting with vision deficits and undershooting when reaching for objects during grooming and drinking.      Vision Baseline Vision/History: (Unsure due to poor historian) Additional Comments: Pt undershooting and not able to successfully reach for objects. Difficult to assess vision due to pt confusion and poor cognition     Perception     Praxis      Pertinent Vitals/Pain Pain Assessment: Faces Faces Pain Scale: No hurt Pain Intervention(s): Monitored during session;Repositioned     Hand Dominance Right   Extremity/Trunk Assessment Upper Extremity Assessment Upper Extremity Assessment: Generalized weakness   Lower Extremity Assessment Lower Extremity Assessment: Defer to PT evaluation   Cervical / Trunk Assessment Cervical / Trunk Assessment: Kyphotic   Communication Communication Communication: No difficulties   Cognition Arousal/Alertness: Awake/alert Behavior During Therapy: Flat affect Overall Cognitive Status: History of cognitive impairments - at baseline Area of Impairment: Problem solving;Safety/judgement;Orientation;Following commands                 Orientation Level: Disoriented to;Place;Time;Situation;Person     Following Commands: Follows one step commands inconsistently(Not following any commands and saying "no") Safety/Judgement: Decreased awareness of safety;Decreased awareness of deficits   Problem Solving: Slow processing;Decreased initiation;Difficulty sequencing;Requires verbal cues;Requires tactile  cues General Comments: Pt highly impulsive and unsafe. Pt confused and not following cues. During ADLs, pt would reach for wash clothe, miss it, grab nothing, and then bring to his face. Pt also attempt to grab cup, missed it, brought it hands to his face, and then swallowed nothing and said mhmmm. Pt asking at one point "where is that baby? I hear a baby."    General Comments  NT and RN assisting with safe return to bed at end of session    Exercises     Shoulder Instructions      Home Living Family/patient expects to be discharged to:: Private residence Living Arrangements: Spouse/significant other;Children Available Help at Discharge: Personal care attendant;Available PRN/intermittently Type of Home: House Home Access: Ramped entrance     Home Layout: One level     Bathroom Shower/Tub: Chief Strategy Officer: Standard Bathroom Accessibility: Yes   Home Equipment: Environmental consultant - 2 wheels;Cane - single point;Bedside commode;Shower seat;Wheelchair - manual   Additional Comments: No family present to confirm. Information from chart review      Prior Functioning/Environment Level of Independence: Independent with assistive device(s);Needs assistance  Gait / Transfers Assistance Needed: uses cane and walker  ADL's / Homemaking Assistance Needed: States he is independent with ADL's and helps his wife with her care   Comments: Patient does not drive.  His daughter provides transportation.  Per notes in chart from April admission, daughter has been trying to get pt into an adult day program. Information from chart review  OT Problem List: Decreased strength;Decreased range of motion;Decreased activity tolerance;Impaired balance (sitting and/or standing);Impaired vision/perception;Decreased coordination;Decreased cognition;Decreased safety awareness;Decreased knowledge of use of DME or AE;Decreased knowledge of precautions;Impaired UE functional use      OT  Treatment/Interventions: Self-care/ADL training;Therapeutic exercise;Energy conservation;DME and/or AE instruction;Therapeutic activities;Patient/family education;Cognitive remediation/compensation;Balance training;Visual/perceptual remediation/compensation    OT Goals(Current goals can be found in the care plan section) Acute Rehab OT Goals Patient Stated Goal: "Go to the store and get my orange juice." OT Goal Formulation: With patient Time For Goal Achievement: 03/13/18 Potential to Achieve Goals: Good ADL Goals Pt Will Perform Eating: (P) with min assist;sitting(Min cues) Pt Will Perform Grooming: (P) with min assist;sitting(Min cues) Pt Will Perform Upper Body Dressing: (P) with min assist;sitting(Min cues) Pt Will Perform Lower Body Dressing: (P) with mod assist;sit to/from stand(Min cues) Pt Will Transfer to Toilet: (P) with min assist;ambulating;bedside commode(Min cues)  OT Frequency: Min 2X/week   Barriers to D/C:            Co-evaluation              AM-PAC PT "6 Clicks" Daily Activity     Outcome Measure Help from another person eating meals?: A Lot Help from another person taking care of personal grooming?: A Lot Help from another person toileting, which includes using toliet, bedpan, or urinal?: Total Help from another person bathing (including washing, rinsing, drying)?: A Lot Help from another person to put on and taking off regular upper body clothing?: A Lot Help from another person to put on and taking off regular lower body clothing?: A Lot 6 Click Score: 11   End of Session Equipment Utilized During Treatment: Rolling walker;Gait belt Nurse Communication: Mobility status;Other (comment)(Poor safety awarenss)  Activity Tolerance: Other (comment)(Limited by confusion) Patient left: in bed;with call bell/phone within reach;with bed alarm set;with nursing/sitter in room  OT Visit Diagnosis: Unsteadiness on feet (R26.81);Other abnormalities of gait and  mobility (R26.89);Muscle weakness (generalized) (M62.81);Other symptoms and signs involving cognitive function                Time: 1610-9604 OT Time Calculation (min): 39 min Charges:  OT General Charges $OT Visit: 1 Visit OT Evaluation $OT Eval Moderate Complexity: 1 Mod OT Treatments $Self Care/Home Management : 23-37 mins G-Codes:     Nicklous Aburto MSOT, OTR/L Acute Rehab Pager: [phone] Office: [phone]  Theodoro Grist Martinique Pizzimenti 02/27/2018, 5:20 PM

## 2018-02-27 NOTE — Evaluation (Signed)
Clinical/Bedside Swallow Evaluation Patient Details  Name: Shane [name] MRN: [id number] Date of Birth: 07-05-1941  Today's Date: 02/27/2018 Time: SLP Start Time (ACUTE ONLY): 1520 SLP Stop Time (ACUTE ONLY): 1536 SLP Time Calculation (min) (ACUTE ONLY): 16 min  Past Medical History:  Past Medical History:  Diagnosis Date  . 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 Arthritis    bursitis in hip  . Bilateral inguinal hernia (BIH)    right > left side  . 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 Reynolds Bowl  . Elevated troponin 04/12/2015  . Full dentures   . Hypertension   . Tobacco abuse   . Urinary tract infection due to Enterococcus 08/03/2015  . Varicocele    Past Surgical History:  Past Surgical History:  Procedure Laterality Date  . AV FISTULA PLACEMENT, BRACHIOCEPHALIC  10/19/2010   LEFT  . AV FISTULA PLACEMENT, RADIOCEPHALIC  01/30/2011   Right w/ ligation of competing venous branch by Dr. Leonides Sake  . AV FISTULA PLACEMENT, RADIOCEPHALIC  09/13/2010   LEFT  . BASCILIC VEIN TRANSPOSITION Left 04/08/2015   Procedure: BASILIC VEIN TRANSPOSITION;  Surgeon: Pryor Ochoa, MD;  Location: [facility] OR;  Service: Vascular;  Laterality: Left;  . COLONOSCOPY    . INGUINAL HERNIA REPAIR Bilateral 11/25/2015   Procedure: LAPAROSCOPIC BILATERAL INGUINAL HERNIA REPAIR--EXCISION OF RIGHT HYDROCELE;  Surgeon: Karie Soda, MD;  Location: [facility] OR;  Service: General;  Laterality: Bilateral;  . INSERTION OF MESH Bilateral 11/25/2015   Procedure: INSERTION OF MESH;  Surgeon: Karie Soda, MD;  Location: [facility] OR;  Service: General;  Laterality: Bilateral;  . MULTIPLE TOOTH EXTRACTIONS     HPI:  The patient is a 77 y.o. year-old with hx AL amyloid, ESRD on HD, atrial fib who presents to ED with sob and resp distress. Per chart missed Wed and Fri Dialysis treatments last week and had been leaving early in  treatments prior to that. Found to be grossly volume overloaded and hyperkalemic. CXR Small left pleural effusion noted. Mild left basilar airspace opacity may reflect atelectasis or possibly mild asymmetric interstitial edema.`   Assessment / Plan / Recommendation Clinical Impression  Pt did not exhibit overt s/s aspiration during this assessment. RN and tech report coughing most of the day following thin liquids. Pt is confused, trying to put telemetry wires in mouth. Given RN report and confusion SLP will plave pt on nectar thick liquids and decrease diet consistency to Dys 3. Will return tomorrow for diagnostic treatment for upgrade versus MBS. SLP Visit Diagnosis: Dysphagia, unspecified (R13.10)    Aspiration Risk  Mild aspiration risk    Diet Recommendation Dysphagia 3 (Mech soft);Nectar-thick liquid   Liquid Administration via: Cup Medication Administration: Whole meds with puree Supervision: Patient able to self feed;Intermittent supervision to cue for compensatory strategies Compensations: Slow rate;Small sips/bites Postural Changes: Seated upright at 90 degrees    Other  Recommendations Oral Care Recommendations: Oral care BID   Follow up Recommendations None      Frequency and Duration min 2x/week  2 weeks       Prognosis Prognosis for Safe Diet Advancement: Good Barriers to Reach Goals: Cognitive deficits      Swallow Study   General HPI: The patient is a 77 y.o. year-old with hx AL amyloid, ESRD on HD, atrial fib who presents to ED with sob and resp distress. Per chart missed Wed  and Fri Dialysis treatments last week and had been leaving early in treatments prior to that. Found to be grossly volume overloaded and hyperkalemic. CXR Small left pleural effusion noted. Mild left basilar airspace opacity may reflect atelectasis or possibly mild asymmetric interstitial edema.` Type of Study: Bedside Swallow Evaluation Previous Swallow Assessment: (none) Diet Prior to this  Study: Regular;Thin liquids Temperature Spikes Noted: No Respiratory Status: Room air History of Recent Intubation: No Behavior/Cognition: Alert;Cooperative;Confused;Requires cueing;Distractible Oral Cavity Assessment: Within Functional Limits Oral Care Completed by SLP: No Oral Cavity - Dentition: (dentures (upper/lower difficult to fully assess) Vision: Functional for self-feeding Self-Feeding Abilities: Needs assist;Needs set up Patient Positioning: Upright in bed Baseline Vocal Quality: Normal Volitional Cough: Cognitively unable to elicit Volitional Swallow: Unable to elicit    Oral/Motor/Sensory Function Overall Oral Motor/Sensory Function: Within functional limits   Ice Chips Ice chips: Not tested   Thin Liquid Thin Liquid: Within functional limits Presentation: Cup;Straw    Nectar Thick Nectar Thick Liquid: Not tested   Honey Thick Honey Thick Liquid: Not tested   Puree Puree: Within functional limits   Solid   GO   Solid: Within functional limits        Royce Macadamia 02/27/2018,4:07 PM  Breck Coons Lonell Face.Ed [employer] [phone]

## 2018-02-27 NOTE — Care Management Note (Signed)
Case Management Note Donn Pierini RN, BSN Unit 4E-Case Manager [phone]  Patient Details  Name: Shane [name] MRN: [id number] Date of Birth: Mar 17, 1941  Subjective/Objective:  Pt admitted with     Acute resp distress secondary to missed HD and vol. overload             Action/Plan: PTA pt lived at home with spouse whom pt is caregiver for- pt with hx of dementia/noncompliance himself. Per PT/OT evals recommendation for SNF- son involved in decision making- CSW following for possible SNF placement-   Expected Discharge Date:                  Expected Discharge Plan:  Skilled Nursing Facility  In-House Referral:  Clinical Social Work  Discharge planning Services  CM Consult  Post Acute Care Choice:    Choice offered to:     DME Arranged:    DME Agency:     HH Arranged:    HH Agency:     Status of Service:  In process, will continue to follow  If discussed at Long Length of Stay Meetings, dates discussed:    Discharge Disposition:   Additional Comments:  Darrold Span, RN 02/27/2018, 4:20 PM

## 2018-02-27 NOTE — Progress Notes (Signed)
[facility] NEPHROLOGY PROGRESS NOTE  Assessment/ Plan: Pt is a 78 y.o. yo male  ESRD on HD, afib p/w SOB and respiratory distress.  Assessment/Plan:  #Respiratory distress due to pulmonary edema: Improving # ESRD:MWF.  Last dialysis yesterday.  Plan for dialysis today to resume his   MWF schedule.    # Anemia: Hemoglobin stable. # Secondary hyperparathyroidism: Phosphorus level elevated.  Continue PhosLo and increased the dose of Auryxia.  Unknown if patient is taking binders with the food because of his confusion.  On calcitriol. # HTN/volume: Monitor blood pressure.  On metoprolol. #Confusion/agitation: Per primary.  No improvement.  I recommend palliative consult evaluation.  Subjective: Seen and examined at bedside.  Remains confused.  Review of systems limited.   Objective Vital signs in last 24 hours: Vitals:   02/26/18 1300 02/26/18 2040 02/26/18 2123 02/27/18 0507  BP:  111/68  128/73  Pulse: (!) 42 83  (!) 103  Resp: 18 14  (!) 22  Temp:  99.4 F (37.4 C)  97.9 F (36.6 C)  TempSrc:  Axillary  Axillary  SpO2: (!) 83% 100% 99% 94%  Weight:       Weight change:  No intake or output data in the 24 hours ending 02/27/18 1201     Labs: Basic Metabolic Panel: Recent Labs  Lab 02/24/18 0631 02/25/18 0351 02/26/18 0800  NA 140 139 138  K 3.9 5.5* 4.4  CL 95* 96* 95*  CO2 GLUCOSE 82 108* 97  BUN 24* 32* 37*  CREATININE 7.17* 8.77* 10.41*  CALCIUM 8.6* 8.3* 8.4*  PHOS  --   --  8.2*   Liver Function Tests: Recent Labs  Lab 02/26/18 0800  ALBUMIN 3.1*   No results for input(s): LIPASE, AMYLASE in the last 168 hours. Recent Labs  Lab 02/26/18 0719  AMMONIA 17   CBC: Recent Labs  Lab 02/23/18 2055 02/24/18 0631 02/26/18 0759  WBC 5.6 6.9 5.2  HGB 10.2* 9.9* 9.8*  HCT 32.6* 31.7* 31.2*  MCV 103.5* 102.3* 101.6*  PLT 109* 105* 98*   Cardiac Enzymes: Recent Labs  Lab 02/23/18 2055  TROPONINI 0.28*   CBG: Recent  Labs  Lab 02/26/18 2045 02/27/18 0106 02/27/18 0502 02/27/18 0737 02/27/18 1137  GLUCAP 86 95 82 82 100*    Iron Studies: No results for input(s): IRON, TIBC, TRANSFERRIN, FERRITIN in the last 72 hours. Studies/Results: No results found.  Medications: Infusions:   Scheduled Medications: . ammonium lactate   Topical Daily  . calcitRIOL  1 mcg Oral Q M,W,F-HD  . calcium acetate  2,001 mg Oral TID WC  . ferric citrate  630 mg Oral TID WC  . heparin  5,000 Units Subcutaneous Q8H  . ipratropium-albuterol  3 mL Nebulization TID  . metoprolol succinate  12.5 mg Oral Daily  . nicotine  21 mg Transdermal Daily  . pantoprazole  40 mg Oral Daily  . triamcinolone cream   Topical Daily    have reviewed scheduled and prn medications.  Physical Exam: General: Confused elderly male, not in distress Heart:RRR, s1s2 nl Lungs:clear bilateral, no crackles Abdomen:soft, Non-tender, non-distended Extremities:No edema Dialysis Access: Right upper extremity AV fistula has good thrill and bruit  Vern Prestia Prasad Ambre Kobayashi 02/27/2018,12:01 PM  LOS: 4 days

## 2018-02-27 NOTE — Progress Notes (Signed)
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  In the ED he was found to be grossly volume overloaded and hyperkalemic.    Assessment & Plan:   Active Problems:   ESRD needing dialysis (HCC)   Acute respiratory failure with hypoxia (HCC)   SOB (shortness of breath)   Hypoxia   Acute delirium   Dementia with behavioral disturbance   ESRD with non compliance to HD: Came in with fluid overload, plan for HD today.  Nephrology on board and assisting with management.    Dementia with restlessness and slight agitation:  Pt will need 24 hour supervision.    Hypertension:  Well controlled.   Hypoglycemia:  appears to have resolved.   Chronic atrial fibrillation In NSR today.  Not a candidate for anti coagulation in view of the non compliance.    Slight dry coughing today.  SLP evaluation.    Hyperkalemia resolved.    Acute respiratory failure with hypoxia sec to pulm edema , improving.  Currently on 3 lit of [place] oxygen.    DVT prophylaxis: heparin./  Code Status: (Full/Partial - specify details) Family Communication: (none at bedside today.  Disposition Plan: pending resolution of the pulm edema.     Consultants:   Nephrology  slp evaluation.     Procedures: HDMWF  Antimicrobials: NONE.   Subjective: Wants to go home, denies any chest pain sob.   Objective: Vitals:   02/26/18 1300 02/26/18 2040 02/26/18 2123 02/27/18 0507  BP:  111/68  128/73  Pulse: (!) 42 83  (!) 103  Resp: 18 14  (!) 22  Temp:  99.4 F (37.4 C)  97.9 F (36.6 C)  TempSrc:  Axillary  Axillary  SpO2: (!) 83% 100% 99% 94%  Weight:        Intake/Output Summary (Last 24 hours) at 02/27/2018 1135 Last  data filed at 02/26/2018 1144 Gross per 24 hour  Intake -  Output 3000 ml  Net -3000 ml   Filed Weights   02/24/18 0510 02/26/18 0735 02/26/18 1144  Weight: 68.5 kg (151 lb 0.2 oz) 66.8 kg (147 lb 4.3 oz) 63.5 kg (139 lb 15.9 oz)    Examination:  General exam: Appears calm and comfortable  Respiratory system: Clear to auscultation. Respiratory effort normal. Cardiovascular system: S1 & S2 heard, RRR. No JVD,  No pedal edema. Gastrointestinal system: Abdomen is nondistended, soft and nontender. No organomegaly or masses felt. Normal bowel sounds heard. Central nervous system: confused,  Extremities: Symmetric 5 x 5 power. Skin: No rashes, lesions or ulcers Psychiatry:  Mood & affect appropriate.     Data Reviewed: I have personally reviewed following labs and imaging studies  CBC: Recent Labs  Lab 02/23/18 2055 02/24/18 0631 02/26/18 0759  WBC 5.6 6.9 5.2  HGB 10.2* 9.9* 9.8*  HCT 32.6* 31.7* 31.2*  MCV 103.5* 102.3* 101.6*  PLT 109* 105* 98*   Basic Metabolic Panel: Recent Labs  Lab 02/23/18 2055 02/24/18 0631 02/25/18 0351 02/26/18 0800  NA 144 140 139 138  K 6.7* 3.9 5.5* 4.4  CL 98* 95* 96* 95*  CO2 GLUCOSE 62* 82 108* 97  BUN 60* 24* 32* 37*  CREATININE 14.45* 7.17* 8.77* 10.41*  CALCIUM 8.6* 8.6*  8.3* 8.4*  PHOS  --   --   --  8.2*   GFR: Estimated Creatinine Clearance: 5.3 mL/min (A) (by C-G formula based on SCr of 10.41 mg/dL (H)). Liver Function Tests: Recent Labs  Lab 02/26/18 0800  ALBUMIN 3.1*   No results for input(s): LIPASE, AMYLASE in the last 168 hours. Recent Labs  Lab 02/26/18 0719  AMMONIA 17   Coagulation Profile: No results for input(s): INR, PROTIME in the last 168 hours. Cardiac Enzymes: Recent Labs  Lab 02/23/18 2055  TROPONINI 0.28*   BNP (last 3 results) No results for input(s): PROBNP in the last 8760 hours. HbA1C: No results for input(s): HGBA1C in the last 72 hours. CBG: Recent Labs  Lab  02/26/18 1613 02/26/18 2045 02/27/18 0106 02/27/18 0502 02/27/18 0737  GLUCAP 73 86 95 82 82   Lipid Profile: No results for input(s): CHOL, HDL, LDLCALC, TRIG, CHOLHDL, LDLDIRECT in the last 72 hours. Thyroid Function Tests: No results for input(s): TSH, T4TOTAL, FREET4, T3FREE, THYROIDAB in the last 72 hours. Anemia Panel: Recent Labs    02/26/18 0719  VITAMINB12 993*  FOLATE 7.2   Sepsis Labs: No results for input(s): PROCALCITON, LATICACIDVEN in the last 168 hours.  Recent Results (from the past 240 hour(s))  MRSA PCR Screening     Status: None   Collection Time: 02/24/18  6:16 AM  Result Value Ref Range Status   MRSA by PCR NEGATIVE NEGATIVE Final    Comment:        The GeneXpert MRSA Assay (FDA approved for NASAL specimens only), is one component of a comprehensive MRSA colonization surveillance program. It is not intended to diagnose MRSA infection nor to guide or monitor treatment for MRSA infections. Performed at [facility], [address] [place], [place] [address]          Radiology Studies: No results found.      Scheduled Meds: . ammonium lactate   Topical Daily  . calcitRIOL  1 mcg Oral Q M,W,F-HD  . calcium acetate  2,001 mg Oral TID WC  . ferric citrate  630 mg Oral TID WC  . heparin  5,000 Units Subcutaneous Q8H  . ipratropium-albuterol  3 mL Nebulization TID  . metoprolol succinate  12.5 mg Oral Daily  . nicotine  21 mg Transdermal Daily  . pantoprazole  40 mg Oral Daily  . triamcinolone cream   Topical Daily   Continuous Infusions:   LOS: 4 days    Time spent: 35 minutes    Kathlen Mody, MD [facility] Pager [phone] If 7PM-7AM, please contact night-coverage [email or web address] Password [facility] 02/27/2018, 11:35 AM

## 2018-02-27 NOTE — Plan of Care (Signed)
Pt"s goal progressing

## 2018-02-27 NOTE — Care Management Important Message (Signed)
Important Message  Patient Details  Name: Shane [name] MRN: [id number] Date of Birth: Oct 22, 1940   Medicare Important Message Given:  Yes    Chinita Schimpf P Ramzey Petrovic 02/27/2018, 3:23 PM

## 2018-02-28 LAB — GLUCOSE, CAPILLARY
GLUCOSE-CAPILLARY: 72 mg/dL (ref 65–99)
GLUCOSE-CAPILLARY: 23 mg/dL — AB (ref 65–99)
GLUCOSE-CAPILLARY: 89 mg/dL (ref 65–99)
Glucose-Capillary: 124 mg/dL — ABNORMAL HIGH (ref 65–99)
Glucose-Capillary: 23 mg/dL — CL (ref 65–99)
Glucose-Capillary: 64 mg/dL — ABNORMAL LOW (ref 65–99)
Glucose-Capillary: 71 mg/dL (ref 65–99)
Glucose-Capillary: 82 mg/dL (ref 65–99)
Glucose-Capillary: 84 mg/dL (ref 65–99)

## 2018-02-28 LAB — CBC WITH DIFFERENTIAL/PLATELET
ABS IMMATURE GRANULOCYTES: 0 10*3/uL (ref 0.0–0.1)
BASOS ABS: 0 10*3/uL (ref 0.0–0.1)
BASOS PCT: 0 %
EOS ABS: 0.3 10*3/uL (ref 0.0–0.7)
Eosinophils Relative: 4 %
HCT: 31.6 % — ABNORMAL LOW (ref 39.0–52.0)
Hemoglobin: 10.1 g/dL — ABNORMAL LOW (ref 13.0–17.0)
IMMATURE GRANULOCYTES: 1 %
Lymphocytes Relative: 9 %
Lymphs Abs: 0.6 10*3/uL — ABNORMAL LOW (ref 0.7–4.0)
MCH: 32.4 pg (ref 26.0–34.0)
MCHC: 32 g/dL (ref 30.0–36.0)
MCV: 101.3 fL — ABNORMAL HIGH (ref 78.0–100.0)
Monocytes Absolute: 0.9 10*3/uL (ref 0.1–1.0)
Monocytes Relative: 14 %
NEUTROS ABS: 4.4 10*3/uL (ref 1.7–7.7)
NEUTROS PCT: 72 %
PLATELETS: 125 10*3/uL — AB (ref 150–400)
RBC: 3.12 MIL/uL — AB (ref 4.22–5.81)
RDW: 14.1 % (ref 11.5–15.5)
WBC: 6.2 10*3/uL (ref 4.0–10.5)

## 2018-02-28 LAB — GLUCOSE, RANDOM: GLUCOSE: 89 mg/dL (ref 65–99)

## 2018-02-28 LAB — RENAL FUNCTION PANEL
Albumin: 3 g/dL — ABNORMAL LOW (ref 3.5–5.0)
Anion gap: 13 (ref 5–15)
BUN: 28 mg/dL — ABNORMAL HIGH (ref 6–20)
CO2: 27 mmol/L (ref 22–32)
CREATININE: 8.45 mg/dL — AB (ref 0.61–1.24)
Calcium: 8.8 mg/dL — ABNORMAL LOW (ref 8.9–10.3)
Chloride: 97 mmol/L — ABNORMAL LOW (ref 101–111)
GFR, EST AFRICAN AMERICAN: 6 mL/min — AB (ref >60)
GFR, EST NON AFRICAN AMERICAN: 5 mL/min — AB (ref >60)
Glucose, Bld: 94 mg/dL (ref 65–99)
Phosphorus: 7.2 mg/dL — ABNORMAL HIGH (ref 2.5–4.6)
Potassium: 4.1 mmol/L (ref 3.5–5.1)
SODIUM: 137 mmol/L (ref 135–145)

## 2018-02-28 MED ORDER — CHLORHEXIDINE GLUCONATE CLOTH 2 % EX PADS
6.0000 | MEDICATED_PAD | Freq: Every day | CUTANEOUS | Status: DC
  Administered 2018-03-03 – 2018-03-05 (×3): 6 via TOPICAL

## 2018-02-28 MED ORDER — DEXTROSE 50 % IV SOLN
INTRAVENOUS | Status: AC
  Administered 2018-02-28: 25 mL
  Filled 2018-02-28: qty 50

## 2018-02-28 NOTE — Progress Notes (Signed)
  Speech Language Pathology Treatment: Dysphagia  Patient Details Name: Shane [name] MRN: [id number] DOB: Jun 18, 1941 Today's Date: 02/28/2018 Time: 7829-5621 SLP Time Calculation (min) (ACUTE ONLY): 11 min  Assessment / Plan / Recommendation Clinical Impression  Pt required max cues to sustain alertness, attempt to hold cup and orient himself; speech unintelligible after HD today. Had hypoglycemic event with RN earlier. Documentation of lung sounds via nurse are crackles. Observed with only 2 sips nectar thick juice given difficulty maintaining alertness. No s/s aspiration however MBS warranted for tomorrow. Continue Dys 3, nectar when alert.      HPI HPI: The patient is a 77 y.o. year-old with hx AL amyloid, ESRD on HD, atrial fib who presents to ED with sob and resp distress. Per chart missed Wed and Fri Dialysis treatments last week and had been leaving early in treatments prior to that. Found to be grossly volume overloaded and hyperkalemic. CXR Small left pleural effusion noted. Mild left basilar airspace opacity may reflect atelectasis or possibly mild asymmetric interstitial edema.`      SLP Plan  MBS       Recommendations  Diet recommendations: Dysphagia 3 (mechanical soft);Nectar-thick liquid Liquids provided via: Cup Medication Administration: Whole meds with puree Supervision: Full supervision/cueing for compensatory strategies;Staff to assist with self feeding Compensations: Slow rate;Small sips/bites Postural Changes and/or Swallow Maneuvers: Seated upright 90 degrees                Oral Care Recommendations: Oral care BID Follow up Recommendations: (TBD) SLP Visit Diagnosis: Dysphagia, unspecified (R13.10) Plan: MBS       GO                Royce Macadamia 02/28/2018, 4:42 PM    Breck Coons Lonell Face.Ed [employer] [phone]

## 2018-02-28 NOTE — Progress Notes (Signed)
PT Cancellation Note  Patient Details Name: Shane [name] MRN: [id number] DOB: 24-Jun-1941   Cancelled Treatment:    Reason Eval/Treat Not Completed: Patient at procedure or test/unavailable(Pt at HD.  Will check back as able.  Thanks. )   Shane [name] 02/28/2018, 8:34 AM Shane [name] Acute Rehabilitation [phone] (pager)

## 2018-02-28 NOTE — Progress Notes (Signed)
PT Cancellation Note  Patient Details Name: Shane [name] MRN: [id number] DOB: [birth year]   Cancelled Treatment:    Reason Eval/Treat Not Completed: Other (comment). Checked back in PM. RN advised awaiting on test results before physical therapy can mobilize. Will check back later if time allows.   Kallie Locks, PTA Pager [phone] Acute Rehab   Sheral Apley 02/28/2018, 2:13 PM

## 2018-02-28 NOTE — Procedures (Addendum)
Patient was seen on dialysis and the procedure was supervised.  BFR 400  Via AVF BP is  127/77.   Patient appears to be tolerating treatment well.  Remains confused.  Recommend palliative care evaluation. Patient is MWF, order dialysis for tomorrow to resume his schedule.   Vania Rosero Jaynie Collins 02/28/2018

## 2018-02-28 NOTE — Progress Notes (Signed)
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  In the ED he was found to be grossly volume overloaded and hyperkalemic.    Assessment & Plan:   Active Problems:   ESRD needing dialysis (HCC)   Acute respiratory failure with hypoxia (HCC)   SOB (shortness of breath)   Hypoxia   Acute delirium   Dementia with behavioral disturbance   ESRD with non compliance to HD: Came in with fluid overload, secondary to missing HD sessions.   Nephrology on board and assisting with management.    Dementia with restlessness .  PT confused at baseline as per the daughter who is the caregiver.  Pt will need 24 hour supervision. Discussed at length with the patient's daughter, his caregiver, is open to palliative care consult for goals of care and SNF placement.    Hypertension:  Well controlled.   Hypoglycemia:  appears to have resolved.   Chronic atrial fibrillation In NSR today.  Not a candidate for anti coagulation in view of the non compliance.    Slight dry coughing today.  SLP evaluation recommending dysphagia 3 diet with nectar thick liquids .  MBS today vs repeat evaluation by SLP.    Hyperkalemia resolved.    Acute respiratory failure with hypoxia sec to pulm edema , improving.    Anemia of chronic disease:  Hemoglobin stable at 10.   COPD  No wheezing heard.     DVT prophylaxis: heparin. Code Status: full code.  Family Communication: (none at bedside today.  Discussed with daughter over the phone. Wife has cerebellar ataxia.  Disposition Plan: SNF once medically stable. Currently waiting for palliative care consult.    Consultants:   Nephrology  slp evaluation.     Procedures:  HDMWF  Antimicrobials: NONE.   Subjective: Wants to go home. Denies any new complaints.   Objective: Vitals:   02/28/18 0830 02/28/18 0900 02/28/18 0930 02/28/18 1000  BP: 134/86 127/77 (!) 163/110 (!) 148/110  Pulse: 73 66 78 93  Resp:      Temp:      TempSrc:      SpO2:      Weight:        Intake/Output Summary (Last 24 hours) at 02/28/2018 1018 Last data filed at 02/28/2018 5784 Gross per 24 hour  Intake 200 ml  Output -  Net 200 ml   Filed Weights   02/26/18 0735 02/26/18 1144 02/28/18 0730  Weight: 66.8 kg (147 lb 4.3 oz) 63.5 kg (139 lb 15.9 oz) 63.5 kg (139 lb 15.9 oz)    Examination:  General exam: Appears calm and comfortable  Respiratory system: Clear to auscultation. Respiratory effort normal. Cardiovascular system: S1 & S2 heard, RRR. No JVD,  No pedal edema. Gastrointestinal system: Abdomen is nondistended, soft and nontender. No organomegaly or masses felt. Normal bowel sounds heard. Central nervous system: confused,  Extremities: Symmetric 5 x 5 power. Skin: No rashes, lesions or ulcers Psychiatry:  Mood & affect appropriate.     Data Reviewed: I have personally reviewed following labs and imaging studies  CBC: Recent Labs  Lab 02/23/18 2055 02/24/18 0631 02/26/18 0759 02/27/18 1328 02/28/18 0809  WBC 5.6 6.9 5.2 7.2 6.2  NEUTROABS  --   --   --   --  4.4  HGB 10.2* 9.9* 9.8* 10.5* 10.1*  HCT 32.6* 31.7* 31.2* 33.1* 31.6*  MCV 103.5* 102.3* 101.6* 102.2* 101.3*  PLT 109* 105* 98* 121* 125*   Basic Metabolic Panel: Recent Labs  Lab 02/24/18 0631 02/25/18 0351 02/26/18 0800 02/27/18 1328 02/28/18 0800  NA 140 139 138 140 137  K 3.9 5.5* 4.4 4.1 4.1  CL 95* 96* 95* 97* 97*  CO2 GLUCOSE 82 108* 97 96 94  BUN 24* 32* 37* 21* 28*  CREATININE 7.17* 8.77* 10.41* 7.53* 8.45*  CALCIUM 8.6* 8.3* 8.4* 8.5* 8.8*  PHOS  --   --  8.2* 6.1* 7.2*   GFR: Estimated Creatinine Clearance: 6.5 mL/min (A) (by C-G formula based on  SCr of 8.45 mg/dL (H)). Liver Function Tests: Recent Labs  Lab 02/26/18 0800 02/27/18 1328 02/28/18 0800  ALBUMIN 3.1* 3.0* 3.0*   No results for input(s): LIPASE, AMYLASE in the last 168 hours. Recent Labs  Lab 02/26/18 0719  AMMONIA 17   Coagulation Profile: No results for input(s): INR, PROTIME in the last 168 hours. Cardiac Enzymes: Recent Labs  Lab 02/23/18 2055  TROPONINI 0.28*   BNP (last 3 results) No results for input(s): PROBNP in the last 8760 hours. HbA1C: No results for input(s): HGBA1C in the last 72 hours. CBG: Recent Labs  Lab 02/27/18 1619 02/27/18 1946 02/28/18 0008 02/28/18 0357 02/28/18 0433  GLUCAP 90 77 84 71 72   Lipid Profile: No results for input(s): CHOL, HDL, LDLCALC, TRIG, CHOLHDL, LDLDIRECT in the last 72 hours. Thyroid Function Tests: No results for input(s): TSH, T4TOTAL, FREET4, T3FREE, THYROIDAB in the last 72 hours. Anemia Panel: Recent Labs    02/26/18 0719  VITAMINB12 993*  FOLATE 7.2   Sepsis Labs: No results for input(s): PROCALCITON, LATICACIDVEN in the last 168 hours.  Recent Results (from the past 240 hour(s))  MRSA PCR Screening     Status: None   Collection Time: 02/24/18  6:16 AM  Result Value Ref Range Status   MRSA by PCR NEGATIVE NEGATIVE Final    Comment:        The GeneXpert MRSA Assay (FDA approved for NASAL specimens only), is one component of a comprehensive MRSA colonization surveillance program. It is not intended to diagnose MRSA infection nor to guide or monitor treatment for MRSA infections. Performed at [facility], [address], [place], [place] [address]          Radiology Studies: No results found.      Scheduled Meds: . ammonium lactate   Topical Daily  . calcitRIOL  1 mcg Oral Q M,W,F-HD  . calcium acetate  2,001 mg Oral TID WC  . ferric citrate  630 mg Oral TID WC  . heparin  5,000 Units Subcutaneous Q8H  . ipratropium-albuterol  3 mL Nebulization TID  .  metoprolol succinate  12.5 mg Oral Daily  . nicotine  21 mg Transdermal Daily  . pantoprazole  40 mg Oral Daily  . triamcinolone cream   Topical Daily   Continuous Infusions: . sodium chloride    . sodium chloride       LOS: 5 days    Time spent: 35 minutes    Kathlen Mody, MD [facility] Pager [phone] If 7PM-7AM, please contact night-coverage [email or web address] Password [facility] 02/28/2018, 10:18 AM

## 2018-02-28 NOTE — Progress Notes (Signed)
SLP Cancellation Note  Patient Details Name: Shane [name] MRN: [id number] DOB: May 19, 1941   Cancelled treatment:        Pt in HD. Will continue efforts.                                                                                                Royce Macadamia 02/28/2018, 11:59 AM  Breck Coons Lonell Face.Ed [employer] [phone]

## 2018-02-28 NOTE — Progress Notes (Signed)
Clinical Social Worker following patient for support and discharge needs. CSW unable to do assessment with patient as he is only orient to self. CSW contacted patients children (emergency contact) but was unable to reach patients family. CSW left voicemail for family to please contact her back.   Marrianne Mood, MSW,  [employer] [phone]

## 2018-03-01 ENCOUNTER — Inpatient Hospital Stay (HOSPITAL_COMMUNITY): Payer: Medicare Other

## 2018-03-01 LAB — GLUCOSE, CAPILLARY
GLUCOSE-CAPILLARY: 28 mg/dL — AB (ref 65–99)
GLUCOSE-CAPILLARY: 64 mg/dL — AB (ref 65–99)
GLUCOSE-CAPILLARY: 122 mg/dL — AB (ref 65–99)
GLUCOSE-CAPILLARY: 44 mg/dL — AB (ref 65–99)
GLUCOSE-CAPILLARY: 77 mg/dL (ref 65–99)
Glucose-Capillary: 27 mg/dL — CL (ref 65–99)
Glucose-Capillary: 30 mg/dL — CL (ref 65–99)
Glucose-Capillary: 73 mg/dL (ref 65–99)
Glucose-Capillary: 86 mg/dL (ref 65–99)
Glucose-Capillary: 92 mg/dL (ref 65–99)

## 2018-03-01 LAB — RENAL FUNCTION PANEL
ANION GAP: 12 (ref 5–15)
Albumin: 3.1 g/dL — ABNORMAL LOW (ref 3.5–5.0)
BUN: 14 mg/dL (ref 6–20)
CALCIUM: 8.7 mg/dL — AB (ref 8.9–10.3)
CO2: 27 mmol/L (ref 22–32)
Chloride: 96 mmol/L — ABNORMAL LOW (ref 101–111)
Creatinine, Ser: 6.13 mg/dL — ABNORMAL HIGH (ref 0.61–1.24)
GFR calc non Af Amer: 8 mL/min — ABNORMAL LOW (ref >60)
GFR, EST AFRICAN AMERICAN: 9 mL/min — AB (ref >60)
Glucose, Bld: 91 mg/dL (ref 65–99)
PHOSPHORUS: 4.7 mg/dL — AB (ref 2.5–4.6)
Potassium: 3.9 mmol/L (ref 3.5–5.1)
SODIUM: 135 mmol/L (ref 135–145)

## 2018-03-01 LAB — CBC
HCT: 32.5 % — ABNORMAL LOW (ref 39.0–52.0)
HEMOGLOBIN: 10.4 g/dL — AB (ref 13.0–17.0)
MCH: 32.4 pg (ref 26.0–34.0)
MCHC: 32 g/dL (ref 30.0–36.0)
MCV: 101.2 fL — ABNORMAL HIGH (ref 78.0–100.0)
Platelets: 137 10*3/uL — ABNORMAL LOW (ref 150–400)
RBC: 3.21 MIL/uL — AB (ref 4.22–5.81)
RDW: 14 % (ref 11.5–15.5)
WBC: 5.7 10*3/uL (ref 4.0–10.5)

## 2018-03-01 LAB — BASIC METABOLIC PANEL
ANION GAP: 14 (ref 5–15)
BUN: 14 mg/dL (ref 6–20)
CO2: 28 mmol/L (ref 22–32)
Calcium: 8.6 mg/dL — ABNORMAL LOW (ref 8.9–10.3)
Chloride: 94 mmol/L — ABNORMAL LOW (ref 101–111)
Creatinine, Ser: 6.05 mg/dL — ABNORMAL HIGH (ref 0.61–1.24)
GFR, EST AFRICAN AMERICAN: 9 mL/min — AB (ref >60)
GFR, EST NON AFRICAN AMERICAN: 8 mL/min — AB (ref >60)
Glucose, Bld: 92 mg/dL (ref 65–99)
POTASSIUM: 4.1 mmol/L (ref 3.5–5.1)
SODIUM: 136 mmol/L (ref 135–145)

## 2018-03-01 LAB — HEPATITIS B SURFACE ANTIGEN: HEP B S AG: NEGATIVE

## 2018-03-01 MED ORDER — LIDOCAINE-PRILOCAINE 2.5-2.5 % EX CREA: 1.0000 "application " | TOPICAL_CREAM | CUTANEOUS | Status: DC | PRN

## 2018-03-01 MED ORDER — SODIUM CHLORIDE 0.9 % IV SOLN: 100.0000 mL | INTRAVENOUS | Status: DC | PRN

## 2018-03-01 MED ORDER — HEPARIN SODIUM (PORCINE) 1000 UNIT/ML DIALYSIS: 1000.0000 [IU] | INTRAMUSCULAR | Status: DC | PRN

## 2018-03-01 MED ORDER — DEXTROSE 50 % IV SOLN
1.0000 | INTRAVENOUS | Status: DC | PRN
  Administered 2018-03-01: 50 mL via INTRAVENOUS
  Administered 2018-03-02 (×2): 25 mL via INTRAVENOUS
  Administered 2018-03-03: 50 mL via INTRAVENOUS
  Filled 2018-03-01 (×4): qty 50

## 2018-03-01 MED ORDER — LIDOCAINE HCL (PF) 1 % IJ SOLN: 5.0000 mL | INTRAMUSCULAR | Status: DC | PRN

## 2018-03-01 MED ORDER — PENTAFLUOROPROP-TETRAFLUOROETH EX AERO: 1.0000 "application " | INHALATION_SPRAY | CUTANEOUS | Status: DC | PRN

## 2018-03-01 MED ORDER — DEXTROSE 5 % IV SOLN
INTRAVENOUS | Status: DC
  Administered 2018-03-01: 50 mL via INTRAVENOUS
  Administered 2018-03-02 (×2): via INTRAVENOUS
  Administered 2018-03-03: 40 mL/h via INTRAVENOUS
  Administered 2018-03-04 (×2): via INTRAVENOUS
  Administered 2018-03-06: 500 mL via INTRAVENOUS

## 2018-03-01 MED ORDER — DEXTROSE 50 % IV SOLN
INTRAVENOUS | Status: AC
Start: 2018-03-01 — End: 2018-03-02
  Administered 2018-03-02: 50 mL
  Filled 2018-03-01: qty 50

## 2018-03-01 MED ORDER — ALTEPLASE 2 MG IJ SOLR: 2.0000 mg | Freq: Once | INTRAMUSCULAR | Status: DC | PRN

## 2018-03-01 MED ORDER — DEXTROSE 50 % IV SOLN
INTRAVENOUS | Status: AC
  Administered 2018-03-01: 08:00:00
  Filled 2018-03-01: qty 50

## 2018-03-01 NOTE — Progress Notes (Signed)
Physical Therapy Treatment Patient Details Name: Shane [name] MRN: [id number] DOB: [birth year] Today's Date: 03/01/2018    History of Present Illness The patient is a 77 y.o. year-old with hx AL amyloid, ESRD on HD, atrial fib who presents to ED with sob and resp distress.  Was on bipap briefly in ed but didn't tolerate well. Pt missed WEd and Fri Dialysis treatments last week and had been leaving early in treatments prior to that.      PT Comments    Pt more awake today but not following commands and very confused.   Follow Up Recommendations  SNF;Supervision/Assistance - 24 hour     Equipment Recommendations  Other (comment)(To be assessed)    Recommendations for Other Services       Precautions / Restrictions Precautions Precautions: Fall Restrictions Weight Bearing Restrictions: No    Mobility  Bed Mobility Overal bed mobility: Needs Assistance Bed Mobility: Supine to Sit;Sit to Supine     Supine to sit: +2 for physical assistance;Total assist Sit to supine: Total assist;+2 for physical assistance   General bed mobility comments: Assist for all aspects due to confusion.  Transfers                 General transfer comment: Attempted x 3 to stand but pt not attempting to assist at all  Ambulation/Gait                 Stairs             Wheelchair Mobility    Modified Rankin (Stroke Patients Only)       Balance Overall balance assessment: Needs assistance Sitting-balance support: Bilateral upper extremity supported;Feet supported Sitting balance-Leahy Scale: Poor Sitting balance - Comments: Sat EOB x 10 minutes with mod to min assist. Pt with lt lateral lean.                                    Cognition Arousal/Alertness: Awake/alert Behavior During Therapy: Flat affect;Restless Overall Cognitive Status: No family/caregiver present to determine baseline cognitive functioning Area of Impairment: Problem  solving;Safety/judgement;Orientation;Following commands                 Orientation Level: Disoriented to;Place;Time;Situation;Person     Following Commands: Follows one step commands inconsistently Safety/Judgement: Decreased awareness of safety;Decreased awareness of deficits   Problem Solving: Slow processing;Decreased initiation;Difficulty sequencing;Requires verbal cues;Requires tactile cues General Comments: Pt awake but with eyes closed throughout. Unable to carry on any type of conversation.       Exercises      General Comments        Pertinent Vitals/Pain Pain Assessment: Faces Faces Pain Scale: Hurts little more Pain Location: all over Pain Descriptors / Indicators: Grimacing Pain Intervention(s): Limited activity within patient's tolerance;Monitored during session;Repositioned    Home Living                      Prior Function            PT Goals (current goals can now be found in the care plan section) Progress towards PT goals: Not progressing toward goals - comment    Frequency    Min 2X/week      PT Plan Current plan remains appropriate    Co-evaluation              AM-PAC PT "6 Clicks" Daily Activity  Outcome Measure  Difficulty turning over in bed (including adjusting bedclothes, sheets and blankets)?: Unable Difficulty moving from lying on back to sitting on the side of the bed? : Unable Difficulty sitting down on and standing up from a chair with arms (e.g., wheelchair, bedside commode, etc,.)?: Unable Help needed moving to and from a bed to chair (including a wheelchair)?: Total Help needed walking in hospital room?: Total Help needed climbing 3-5 steps with a railing? : Total 6 Click Score: 6    End of Session Equipment Utilized During Treatment: Gait belt Activity Tolerance: Patient limited by fatigue;Other (comment)(Limited by cognition) Patient left: in bed;with call bell/phone within reach;with bed alarm  set Nurse Communication: Mobility status PT Visit Diagnosis: Other abnormalities of gait and mobility (R26.89);Muscle weakness (generalized) (M62.81)     Time: 6301-6010 PT Time Calculation (min) (ACUTE ONLY): 14 min  Charges:  $Therapeutic Activity: 8-22 mins                    G Codes:       [employer] PT [phone]    Angelina Ok [facility] 03/01/2018, 11:05 AM

## 2018-03-01 NOTE — Progress Notes (Signed)
Modified Barium Swallow Progress Note  Patient Details  Name: Shane [name] MRN: [id number] Date of Birth: 1941/09/28  Today's Date: 03/01/2018  Modified Barium Swallow completed.  Full report located under Chart Review in the Imaging Section.  Brief recommendations include the following:  Clinical Impression  Mr Coe demonstrated oropharyngeal phase swallow primarily due to decreased sensation and cognitive deficits impacting swallow function resulting aspiration of thin liquids. Reduced oral cohesion with premature spill and lingual pumping to move to posterior pharynx. Swallows were initiated at the pyriform sinuses and straw sip thin resulted in aspiration as pt speaking with barium in valleculae and pyriform sinuses with cough as well as aspiration with cup. Aspiration event initiated a coughing episode which lasted for 5-7 minutes (coughing every 15 seconds). Pt continued to bring barium into oral cavity after coughs when mouth and pharynx thought to be previously mostly cleared (no significant residue). Esophageal residue suspected, scanned esophagus revealing what appeared to be mild residue at distal esophagus. Uncertain if originated from esophagus and clear by the time esophagus scanned or mucous from lungs mixed with barium residue in oral cavity. Recommend continue Dys 3 texture, nectar thick liquids, pills whole in applesauce, po's only when alert. ST will continue intervention.    Swallow Evaluation Recommendations       SLP Diet Recommendations: Nectar thick liquid;Dysphagia 3 (Mech soft) solids   Liquid Administration via: Cup;No straw   Medication Administration: Whole meds with puree   Supervision: Patient able to self feed;Full supervision/cueing for compensatory strategies   Compensations: Slow rate;Small sips/bites;Minimize environmental distractions;Lingual sweep for clearance of pocketing   Postural Changes: Seated upright at 90 degrees;Remain semi-upright after  after feeds/meals (Comment)   Oral Care Recommendations: Oral care BID        Royce Macadamia 03/01/2018,12:16 PM   Breck Coons Lonell Face.Ed [employer] [phone]

## 2018-03-01 NOTE — Progress Notes (Signed)
[facility] NEPHROLOGY PROGRESS NOTE  Assessment/ Plan: Pt is a 77 y.o. yo male  ESRD on HD, afib p/w SOB and respiratory distress.  Assessment/Plan:  #Respiratory distress due to pulmonary edema: Improving.  # ESRD:MWF.  Last dialysis yesterday off schedule.  Tolerated well with ultrafiltration of 2.5 kg.  Plan for dialysis today to resume his  MWF schedule.     # Anemia: Hemoglobin stable.  # Secondary hyperparathyroidism: Phosphorus level elevated.  Continue PhosLo and increased the dose of Auryxia.  Unknown if patient is taking binders with the food because of his confusion.  On calcitriol.  # HTN/volume: Monitor blood pressure.  On metoprolol.  #Confusion/agitation: Per primary.  No improvement.  I recommend palliative care evaluation to discuss goals of care.  Subjective: Seen and examined at bedside.  Remained confused.  Review of systems limited.   Objective Vital signs in last 24 hours: Vitals:   02/28/18 1951 02/28/18 2305 03/01/18 0415 03/01/18 0805  BP:  (!) 145/88 (!) 117/93   Pulse:  (!) 129 (!) 116   Resp:  (!) 22 19   Temp:  99.9 F (37.7 C) 98.8 F (37.1 C)   TempSrc:  Axillary Axillary   SpO2: 96% 94%  100%  Weight:       Weight change:   Intake/Output Summary (Last 24 hours) at 03/01/2018 1023 Last data filed at 02/28/2018 2045 Gross per 24 hour  Intake 222 ml  Output 2500 ml  Net -2278 ml       Labs: Basic Metabolic Panel: Recent Labs  Lab 02/26/18 0800 02/27/18 1328 02/28/18 0800 02/28/18 1348  NA 138 140 137  --   K 4.4 4.1 4.1  --   CL 95* 97* 97*  --   CO2 --   GLUCOSE 97 96 94 89  BUN 37* 21* 28*  --   CREATININE 10.41* 7.53* 8.45*  --   CALCIUM 8.4* 8.5* 8.8*  --   PHOS 8.2* 6.1* 7.2*  --    Liver Function Tests: Recent Labs  Lab 02/26/18 0800 02/27/18 1328 02/28/18 0800  ALBUMIN 3.1* 3.0* 3.0*   No results for input(s): LIPASE, AMYLASE in the last 168 hours. Recent Labs  Lab 02/26/18 0719   AMMONIA 17   CBC: Recent Labs  Lab 02/23/18 2055 02/24/18 0631 02/26/18 0759 02/27/18 1328 02/28/18 0809  WBC 5.6 6.9 5.2 7.2 6.2  NEUTROABS  --   --   --   --  4.4  HGB 10.2* 9.9* 9.8* 10.5* 10.1*  HCT 32.6* 31.7* 31.2* 33.1* 31.6*  MCV 103.5* 102.3* 101.6* 102.2* 101.3*  PLT 109* 105* 98* 121* 125*   Cardiac Enzymes: Recent Labs  Lab 02/23/18 2055  TROPONINI 0.28*   CBG: Recent Labs  Lab 02/28/18 2352 03/01/18 0418 03/01/18 0452 03/01/18 0744 03/01/18 0923  GLUCAP 82 64* 73 27* 122*    Iron Studies: No results for input(s): IRON, TIBC, TRANSFERRIN, FERRITIN in the last 72 hours. Studies/Results: No results found.  Medications: Infusions:   Scheduled Medications: . ammonium lactate   Topical Daily  . calcitRIOL  1 mcg Oral Q M,W,F-HD  . calcium acetate  2,001 mg Oral TID WC  . Chlorhexidine Gluconate Cloth  6 each Topical Q0600  . dextrose      . ferric citrate  630 mg Oral TID WC  . heparin  5,000 Units Subcutaneous Q8H  . ipratropium-albuterol  3 mL Nebulization TID  . metoprolol succinate  12.5 mg  Oral Daily  . nicotine  21 mg Transdermal Daily  . pantoprazole  40 mg Oral Daily  . triamcinolone cream   Topical Daily    have reviewed scheduled and prn medications.  Physical Exam: General: Confused elderly male, not in distress Heart: Regular rate rhythm S1-S2 normal Lungs: Clear bilateral, no crackle or wheeze Abdomen:soft, Non-tender, non-distended Extremities:No edema Dialysis Access: Right upper extremity AV fistula has good thrill and bruit  Dron Prasad Bhandari 03/01/2018,10:23 AM  LOS: 6 days

## 2018-03-01 NOTE — Progress Notes (Signed)
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  In the ED he was found to be grossly volume overloaded and hyperkalemic.    Assessment & Plan:   Active Problems:   ESRD needing dialysis (HCC)   Acute respiratory failure with hypoxia (HCC)   SOB (shortness of breath)   Hypoxia   Acute delirium   Dementia with behavioral disturbance   ESRD with non compliance to HD: Came in with fluid overload, secondary to missing HD sessions.   Nephrology on board and assisting with management.    Dementia with restlessness .  PT confused at baseline as per the daughter who is the caregiver.  Pt will need 24 hour supervision. Discussed at length with the patient's daughter, his caregiver, is open to palliative care consult for goals of care and SNF placement.  Pt appears more lethargic today, not answering questions, will get MRI brain for his altered mental status.    Hypertension:  Well controlled.   Hypoglycemia:  Persistent, multiple times today. Very decreased po intake all day today. Will have to start him on dextrose fluids.   Chronic atrial fibrillation In NSR today.  Not a candidate for anti coagulation in view of the non compliance.    Dysphagia of unclear etiology.  Will GET MRI brain for further evaluation.  SLP evaluation recommending dysphagia 3 diet with nectar thick liquids .  MBS done recommending dysphagia 3 diet.    Hyperkalemia resolved.    Acute respiratory failure with hypoxia sec to pulm edema , improving.    Anemia of chronic disease:  Hemoglobin stable at 10.   COPD  No wheezing heard.     DVT prophylaxis: heparin. Code Status: full code.  Family Communication: (none at  bedside today.  Discussed with daughter over the phone.  Disposition Plan: SNF once medically stable. Currently waiting for palliative care consult.    Consultants:   Nephrology  slp evaluation.     Procedures: HDMWF  Antimicrobials: NONE.   Subjective: More lethargic today. With persistent hypoglycemic episodes.   Objective: Vitals:   03/01/18 1500 03/01/18 1530 03/01/18 1600 03/01/18 1617  BP: (!) 110/59 99/62 105/60 107/61  Pulse: 76 92 87 96  Resp:    18  Temp:    97.7 F (36.5 C)  TempSrc:    Oral  SpO2:    100%  Weight:    59.9 kg (132 lb 0.9 oz)    Intake/Output Summary (Last 24 hours) at 03/01/2018 1735 Last data filed at 03/01/2018 1617 Gross per 24 hour  Intake 222 ml  Output 2000 ml  Net -1778 ml   Filed Weights   02/28/18 1146 03/01/18 1315 03/01/18 1617  Weight: 59.9 kg (132 lb 0.9 oz) 61.8 kg (136 lb 3.9 oz) 59.9 kg (132 lb 0.9 oz)    Examination:  General exam: Appears calm , sleepy.  Respiratory system: Clear to auscultation. Respiratory effort normal. Cardiovascular system: S1 & S2 heard, RRR. No JVD,  No pedal edema. Gastrointestinal system: Abdomen is soft non tender non distended bowel sounds good.  Central nervous system: confused,  Extremities: no pedal edema, or cyanosis.  Skin: No rashes, lesions or ulcers Psychiatry:  Lethargic.     Data Reviewed: I have personally reviewed following  labs and imaging studies  CBC: Recent Labs  Lab 02/24/18 0631 02/26/18 0759 02/27/18 1328 02/28/18 0809 03/01/18 1328  WBC 6.9 5.2 7.2 6.2 5.7  NEUTROABS  --   --   --  4.4  --   HGB 9.9* 9.8* 10.5* 10.1* 10.4*  HCT 31.7* 31.2* 33.1* 31.6* 32.5*  MCV 102.3* 101.6* 102.2* 101.3* 101.2*  PLT 105* 98* 121* 125* 137*   Basic Metabolic Panel: Recent Labs  Lab 02/26/18 0800 02/27/18 1328 02/28/18 0800 02/28/18 1348 03/01/18 1050 03/01/18 1328  NA 138 140 137  --  136 135  K 4.4 4.1 4.1  --  4.1 3.9  CL 95* 97* 97*  --  94* 96*  CO2 --  28 27  GLUCOSE 97 96 94 89 92 91  BUN 37* 21* 28*  --  14 14  CREATININE 10.41* 7.53* 8.45*  --  6.05* 6.13*  CALCIUM 8.4* 8.5* 8.8*  --  8.6* 8.7*  PHOS 8.2* 6.1* 7.2*  --   --  4.7*   GFR: Estimated Creatinine Clearance: 8.7 mL/min (A) (by C-G formula based on SCr of 6.13 mg/dL (H)). Liver Function Tests: Recent Labs  Lab 02/26/18 0800 02/27/18 1328 02/28/18 0800 03/01/18 1328  ALBUMIN 3.1* 3.0* 3.0* 3.1*   No results for input(s): LIPASE, AMYLASE in the last 168 hours. Recent Labs  Lab 02/26/18 0719  AMMONIA 17   Coagulation Profile: No results for input(s): INR, PROTIME in the last 168 hours. Cardiac Enzymes: Recent Labs  Lab 02/23/18 2055  TROPONINI 0.28*   BNP (last 3 results) No results for input(s): PROBNP in the last 8760 hours. HbA1C: No results for input(s): HGBA1C in the last 72 hours. CBG: Recent Labs  Lab 03/01/18 0744 03/01/18 0923 03/01/18 1054 03/01/18 1213 03/01/18 1711  GLUCAP 27* 122* 92 86 44*   Lipid Profile: No results for input(s): CHOL, HDL, LDLCALC, TRIG, CHOLHDL, LDLDIRECT in the last 72 hours. Thyroid Function Tests: No results for input(s): TSH, T4TOTAL, FREET4, T3FREE, THYROIDAB in the last 72 hours. Anemia Panel: No results for input(s): VITAMINB12, FOLATE, FERRITIN, TIBC, IRON, RETICCTPCT in the last 72 hours. Sepsis Labs: No results for input(s): PROCALCITON, LATICACIDVEN in the last 168 hours.  Recent Results (from the past 240 hour(s))  MRSA PCR Screening     Status: None   Collection Time: 02/24/18  6:16 AM  Result Value Ref Range Status   MRSA by PCR NEGATIVE NEGATIVE Final    Comment:        The GeneXpert MRSA Assay (FDA approved for NASAL specimens only), is one component of a comprehensive MRSA colonization surveillance program. It is not intended to diagnose MRSA infection nor to guide or monitor treatment for MRSA infections. Performed at [facility], [address], [place], [place]  [address]          Radiology Studies: Dg Swallowing Func-speech Pathology  Result Date: 03/01/2018 Objective Swallowing Evaluation: Type of Study: MBS-Modified Barium Swallow Study  Patient Details Name: FREDIS MALKIEWICZ MRN: [id number] Date of Birth: Mar 12, 1941 Today's Date: 03/01/2018 Time: SLP Start Time (ACUTE ONLY): 1016 -SLP Stop Time (ACUTE ONLY): 1036 SLP Time Calculation (min) (ACUTE ONLY): 20 min Past Medical History: Past Medical History: Diagnosis Date . 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 Arthritis   bursitis in hip . Bilateral inguinal hernia (BIH)   right > left side . 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 Reynolds Bowl . Elevated troponin 04/12/2015 . Full dentures  . Hypertension  . Tobacco abuse  . Urinary tract infection due to Enterococcus 08/03/2015 . Varicocele  Past Surgical History: Past Surgical History: Procedure Laterality Date . AV FISTULA PLACEMENT, BRACHIOCEPHALIC  10/19/2010  LEFT . AV FISTULA PLACEMENT, RADIOCEPHALIC  01/30/2011  Right w/ ligation of competing venous branch by Dr. Leonides Sake . AV FISTULA PLACEMENT, RADIOCEPHALIC  09/13/2010  LEFT . BASCILIC VEIN TRANSPOSITION Left 04/08/2015  Procedure: BASILIC VEIN TRANSPOSITION;  Surgeon: Pryor Ochoa, MD;  Location: [facility] OR;  Service: Vascular;  Laterality: Left; . COLONOSCOPY   . INGUINAL HERNIA REPAIR Bilateral 11/25/2015  Procedure: LAPAROSCOPIC BILATERAL INGUINAL HERNIA REPAIR--EXCISION OF RIGHT HYDROCELE;  Surgeon: Karie Soda, MD;  Location: [facility] OR;  Service: General;  Laterality: Bilateral; . INSERTION OF MESH Bilateral 11/25/2015  Procedure: INSERTION OF MESH;  Surgeon: Karie Soda, MD;  Location: [facility] OR;  Service: General;  Laterality: Bilateral; . MULTIPLE TOOTH EXTRACTIONS   HPI: The patient is a 77 y.o. year-old with hx AL amyloid, ESRD on HD, atrial fib who presents to ED with sob and resp distress. Per chart missed Wed and Fri  Dialysis treatments last week and had been leaving early in treatments prior to that. Found to be grossly volume overloaded and hyperkalemic. CXR Small left pleural effusion noted. Mild left basilar airspace opacity may reflect atelectasis or possibly mild asymmetric interstitial edema.`  No data recorded Assessment / Plan / Recommendation CHL IP CLINICAL IMPRESSIONS 03/01/2018 Clinical Impression Mr Destin demonstrated oropharyngeal phase swallow primarily due to decreased sensation and cognitive deficits impacting swallow function resulting aspiration of thin liquids. Reduced oral cohesion with premature spill and lingual pumping to move to posterior pharynx. Swallows were initiated at the pyriform sinuses and straw sip thin resulted in aspiration as pt speaking with barium in valleculae and pyriform sinuses with cough as well as aspiration with cup. Aspiration event initiated a coughing episode which lasted for 5-7 minutes (coughing every 15 seconds). Pt continued to bring barium into oral cavity after coughs when mouth and pharynx thought to be previously mostly cleared (no significant residue). Esophageal residue suspected, scanned esophagus revealing what appeared to be mild residue at distal esophagus. Uncertain if originated from esophagus and clear by the time esophagus scanned or mucous from lungs mixed with barium residue in oral cavity. Recommend continue Dys 3 texture, nectar thick liquids, pills whole in applesauce, po's only when alert. ST will continue intervention.  SLP Visit Diagnosis Dysphagia, oropharyngeal phase (R13.12) Attention and concentration deficit following -- Frontal lobe and executive function deficit following -- Impact on safety and function Moderate aspiration risk;Severe aspiration risk   CHL IP TREATMENT RECOMMENDATION 03/01/2018 Treatment Recommendations Therapy as outlined in treatment plan below   Prognosis 03/01/2018 Prognosis for Safe Diet Advancement Good Barriers to Reach  Goals Cognitive deficits Barriers/Prognosis Comment -- CHL IP DIET RECOMMENDATION 03/01/2018 SLP Diet Recommendations Nectar thick liquid;Dysphagia 3 (Mech soft) solids Liquid Administration via Cup;No straw Medication Administration Whole meds with puree Compensations Slow rate;Small sips/bites;Minimize environmental distractions;Lingual sweep for clearance of pocketing Postural Changes Seated upright at 90 degrees;Remain semi-upright after after feeds/meals (Comment)   CHL IP OTHER RECOMMENDATIONS 03/01/2018 Recommended Consults -- Oral Care Recommendations Oral care BID Other Recommendations --   CHL IP FOLLOW UP RECOMMENDATIONS 03/01/2018 Follow up Recommendations Other (comment)   CHL IP FREQUENCY AND DURATION 03/01/2018 Speech Therapy Frequency (ACUTE ONLY) min 2x/week Treatment Duration 2 weeks  CHL IP ORAL PHASE 03/01/2018 Oral Phase Impaired Oral - Pudding Teaspoon -- Oral - Pudding Cup -- Oral - Honey Teaspoon -- Oral - Honey Cup -- Oral - Nectar Teaspoon -- Oral - Nectar Cup Delayed oral transit;Decreased bolus cohesion Oral - Nectar Straw -- Oral - Thin Teaspoon -- Oral - Thin Cup Premature spillage;Decreased bolus cohesion;Lingual pumping Oral - Thin Straw Decreased bolus cohesion;Premature spillage Oral - Puree -- Oral - Mech Soft -- Oral - Regular Other (Comment) Oral - Multi-Consistency -- Oral - Pill -- Oral Phase - Comment --  CHL IP PHARYNGEAL PHASE 03/01/2018 Pharyngeal Phase Impaired Pharyngeal- Pudding Teaspoon -- Pharyngeal -- Pharyngeal- Pudding Cup -- Pharyngeal -- Pharyngeal- Honey Teaspoon -- Pharyngeal -- Pharyngeal- Honey Cup -- Pharyngeal -- Pharyngeal- Nectar Teaspoon -- Pharyngeal -- Pharyngeal- Nectar Cup Delayed swallow initiation-pyriform sinuses Pharyngeal -- Pharyngeal- Nectar Straw -- Pharyngeal -- Pharyngeal- Thin Teaspoon -- Pharyngeal -- Pharyngeal- Thin Cup Delayed swallow initiation-pyriform sinuses;Penetration/Aspiration during swallow Pharyngeal Material enters airway,  remains ABOVE vocal cords and not ejected out Pharyngeal- Thin Straw Penetration/Aspiration during swallow;Delayed swallow initiation-pyriform sinuses Pharyngeal Material enters airway, passes BELOW cords and not ejected out despite cough attempt by patient Pharyngeal- Puree -- Pharyngeal -- Pharyngeal- Mechanical Soft -- Pharyngeal -- Pharyngeal- Regular (No Data) Pharyngeal -- Pharyngeal- Multi-consistency -- Pharyngeal -- Pharyngeal- Pill -- Pharyngeal -- Pharyngeal Comment --  CHL IP CERVICAL ESOPHAGEAL PHASE 03/01/2018 Cervical Esophageal Phase WFL Pudding Teaspoon -- Pudding Cup -- Honey Teaspoon -- Honey Cup -- Nectar Teaspoon -- Nectar Cup -- Nectar Straw -- Thin Teaspoon -- Thin Cup -- Thin Straw -- Puree -- Mechanical Soft -- Regular -- Multi-consistency -- Pill -- Cervical Esophageal Comment -- No flowsheet data found. Royce Macadamia 03/01/2018, 12:15 PM Breck Coons Lonell Face.Ed CCC-SLP Pager [phone]                   Scheduled Meds: . ammonium lactate   Topical Daily  . calcitRIOL  1 mcg Oral Q M,W,F-HD  . calcium acetate  2,001 mg Oral TID WC  . Chlorhexidine Gluconate Cloth  6 each Topical Q0600  . ferric citrate  630 mg Oral TID WC  . heparin  5,000 Units Subcutaneous Q8H  . ipratropium-albuterol  3 mL Nebulization TID  . metoprolol succinate  12.5 mg Oral Daily  . nicotine  21 mg Transdermal Daily  . pantoprazole  40 mg Oral Daily  . triamcinolone cream   Topical Daily   Continuous Infusions: . dextrose       LOS: 6 days    Time spent: 35 minutes    Kathlen Mody, MD [facility] Pager [phone] If 7PM-7AM, please contact night-coverage [email or web address] Password [facility] 03/01/2018, 5:35 PM

## 2018-03-02 ENCOUNTER — Inpatient Hospital Stay (HOSPITAL_COMMUNITY): Payer: Medicare Other

## 2018-03-02 LAB — HEMOGLOBIN A1C
HEMOGLOBIN A1C: 4.7 % — AB (ref 4.8–5.6)
MEAN PLASMA GLUCOSE: 88.19 mg/dL

## 2018-03-02 LAB — BASIC METABOLIC PANEL
Anion gap: 10 (ref 5–15)
BUN: 9 mg/dL (ref 6–20)
CALCIUM: 8.9 mg/dL (ref 8.9–10.3)
CO2: 31 mmol/L (ref 22–32)
Chloride: 94 mmol/L — ABNORMAL LOW (ref 101–111)
Creatinine, Ser: 4.9 mg/dL — ABNORMAL HIGH (ref 0.61–1.24)
GFR calc Af Amer: 12 mL/min — ABNORMAL LOW (ref >60)
GFR, EST NON AFRICAN AMERICAN: 10 mL/min — AB (ref >60)
GLUCOSE: 89 mg/dL (ref 65–99)
Potassium: 4 mmol/L (ref 3.5–5.1)
Sodium: 135 mmol/L (ref 135–145)

## 2018-03-02 LAB — GLUCOSE, CAPILLARY
GLUCOSE-CAPILLARY: 120 mg/dL — AB (ref 65–99)
GLUCOSE-CAPILLARY: 58 mg/dL — AB (ref 65–99)
GLUCOSE-CAPILLARY: 64 mg/dL — AB (ref 65–99)
GLUCOSE-CAPILLARY: 70 mg/dL (ref 65–99)
GLUCOSE-CAPILLARY: 79 mg/dL (ref 65–99)
GLUCOSE-CAPILLARY: 86 mg/dL (ref 65–99)
GLUCOSE-CAPILLARY: 91 mg/dL (ref 65–99)
Glucose-Capillary: 79 mg/dL (ref 65–99)
Glucose-Capillary: 69 mg/dL (ref 65–99)
Glucose-Capillary: 64 mg/dL — ABNORMAL LOW (ref 65–99)
Glucose-Capillary: 76 mg/dL (ref 65–99)

## 2018-03-02 LAB — CBC
HEMATOCRIT: 32 % — AB (ref 39.0–52.0)
Hemoglobin: 10.4 g/dL — ABNORMAL LOW (ref 13.0–17.0)
MCH: 32.2 pg (ref 26.0–34.0)
MCHC: 32.5 g/dL (ref 30.0–36.0)
MCV: 99.1 fL (ref 78.0–100.0)
Platelets: 147 10*3/uL — ABNORMAL LOW (ref 150–400)
RBC: 3.23 MIL/uL — ABNORMAL LOW (ref 4.22–5.81)
RDW: 13.6 % (ref 11.5–15.5)
WBC: 6.1 10*3/uL (ref 4.0–10.5)

## 2018-03-02 LAB — HIV 1/2 AB DIFFERENTIATION
HIV 1 Ab: NEGATIVE
HIV 2 Ab: NEGATIVE
Note: NEGATIVE

## 2018-03-02 LAB — HIV ANTIBODY (ROUTINE TESTING W REFLEX): HIV Screen 4th Generation wRfx: REACTIVE — AB

## 2018-03-02 LAB — RNA QUALITATIVE: HIV 1 RNA Qualitative: 1

## 2018-03-02 MED ORDER — LORAZEPAM 2 MG/ML IJ SOLN
1.0000 mg | Freq: Once | INTRAMUSCULAR | Status: AC
  Administered 2018-03-05: 1 mg via INTRAVENOUS

## 2018-03-02 MED ORDER — DIPHENHYDRAMINE HCL 50 MG/ML IJ SOLN
12.5000 mg | Freq: Three times a day (TID) | INTRAMUSCULAR | Status: DC | PRN
  Administered 2018-03-02 – 2018-03-09 (×4): 12.5 mg via INTRAVENOUS
  Filled 2018-03-02 (×4): qty 1

## 2018-03-02 MED ORDER — HALOPERIDOL LACTATE 5 MG/ML IJ SOLN
2.0000 mg | Freq: Four times a day (QID) | INTRAMUSCULAR | Status: DC | PRN
  Administered 2018-03-02 – 2018-03-07 (×5): 2 mg via INTRAVENOUS
  Filled 2018-03-02 (×5): qty 1

## 2018-03-02 MED ORDER — DEXTROSE 50 % IV SOLN
INTRAVENOUS | Status: AC
  Administered 2018-03-02: 25 mL
  Filled 2018-03-02: qty 50

## 2018-03-02 NOTE — Progress Notes (Addendum)
Pt's CBG dropped to 30. 50 ml of dextrose 50% was given. Rechecked CBG about 15 min later. CBG was 58. Administered another 25ml of Dextrose 50%. Rechecked 15 mins later, CBG was 120. Pt is resting in bed. Call light within reach.   Judithann Sheen, RN

## 2018-03-02 NOTE — Progress Notes (Signed)
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  In the ED he was found to be grossly volume overloaded and hyperkalemic.    Assessment & Plan:   Active Problems:   ESRD needing dialysis (HCC)   Acute respiratory failure with hypoxia (HCC)   SOB (shortness of breath)   Hypoxia   Acute delirium   Dementia with behavioral disturbance   ESRD with non compliance to HD: Came in with fluid overload, secondary to missing HD sessions.   Nephrology on board and assisting with management.    Dementia with restlessness .  PT confused at baseline as per the daughter who is the caregiver.  Pt will need 24 hour supervision. Discussed at length with the patient's daughter, his caregiver, is open to palliative care consult for goals of care and SNF placement.  Pt appears more lethargic today, not answering questions, MRI brain ordered, but couldn't be done tonight.  Palliative care consulted and recommendations pending.    Hypertension:  Well controlled.   Hypoglycemia:  Persistent, multiple times today. Very decreased po intake all day for the last 2 days. Started him on dextrose fluids, but concerned about fluid overload and pulm edema.   Chronic atrial fibrillation Rate controlled.  Not a candidate for anti coagulation in view of the non compliance.    Dysphagia of unclear etiology.  ordered MRI brain for further evaluation.  SLP evaluation recommending dysphagia 3 diet with nectar thick liquids .  MBS done recommending dysphagia 3 diet. But pt is either lethargic or confused to eat.    Hyperkalemia resolved.    Acute respiratory failure with hypoxia sec to pulm edema , improving, transtioned off oxygen.     Anemia of chronic disease:  Hemoglobin stable at 10.   COPD  No wheezing heard.     DVT prophylaxis: heparin. Code Status: full code.  Family Communication: (none at bedside today.  Discussed with daughter over the phone.  Disposition Plan: pt a candidate for hospice in view of his   Consultants:   Nephrology  slp evaluation.   Palliative care.     Procedures: HDMWF  Antimicrobials: NONE.   Subjective: More lethargic today. With persistent hypoglycemic episodes.   Objective: Vitals:   03/02/18 1207 03/02/18 1252 03/02/18 1430 03/02/18 1624  BP:  122/84  127/62  Pulse:  88  92  Resp: 18 17  (!) 23  Temp:  98.2 F (36.8 C)  98.2 F (36.8 C)  TempSrc:  Axillary  Oral  SpO2:  94% 95% 92%  Weight:        Intake/Output Summary (Last 24 hours) at 03/02/2018 1629 Last data filed at 03/02/2018 1254 Gross per 24 hour  Intake 677.5 ml  Output 0 ml  Net 677.5 ml   Filed Weights   02/28/18 1146 03/01/18 1315 03/01/18 1617  Weight: 59.9 kg (132 lb 0.9 oz) 61.8 kg (136 lb 3.9 oz) 59.9 kg (132 lb 0.9 oz)    Examination:  General exam: Appears calm , sleepy.  Respiratory system: Clear to auscultation. Respiratory effort normal. Cardiovascular system: S1 & S2 heard, RRR. No JVD,  No pedal edema. Gastrointestinal system: Abdomen is soft non tender non distended bowel sounds good.  Central nervous system: confused,  Extremities: no  pedal edema, or cyanosis.  Skin: No rashes, lesions or ulcers Psychiatry:  Lethargic.     Data Reviewed: I have personally reviewed following labs and imaging studies  CBC: Recent Labs  Lab 02/26/18 0759 02/27/18 1328 02/28/18 0809 03/01/18 1328 03/02/18 0923  WBC 5.2 7.2 6.2 5.7 6.1  NEUTROABS  --   --  4.4  --   --   HGB 9.8* 10.5* 10.1* 10.4* 10.4*  HCT 31.2* 33.1* 31.6* 32.5* 32.0*  MCV 101.6* 102.2* 101.3* 101.2* 99.1  PLT 98* 121* 125* 137* 147*   Basic Metabolic Panel: Recent Labs  Lab 02/26/18 0800  02/27/18 1328 02/28/18 0800 02/28/18 1348 03/01/18 1050 03/01/18 1328 03/02/18 0923  NA 138 140 137  --  136 135 135  K 4.4 4.1 4.1  --  4.1 3.9 4.0  CL 95* 97* 97*  --  94* 96* 94*  CO2 --  GLUCOSE 97 96 94 89 92 91 89  BUN 37* 21* 28*  --  CREATININE 10.41* 7.53* 8.45*  --  6.05* 6.13* 4.90*  CALCIUM 8.4* 8.5* 8.8*  --  8.6* 8.7* 8.9  PHOS 8.2* 6.1* 7.2*  --   --  4.7*  --    GFR: Estimated Creatinine Clearance: 10.9 mL/min (A) (by C-G formula based on SCr of 4.9 mg/dL (H)). Liver Function Tests: Recent Labs  Lab 02/26/18 0800 02/27/18 1328 02/28/18 0800 03/01/18 1328  ALBUMIN 3.1* 3.0* 3.0* 3.1*   No results for input(s): LIPASE, AMYLASE in the last 168 hours. Recent Labs  Lab 02/26/18 0719  AMMONIA 17   Coagulation Profile: No results for input(s): INR, PROTIME in the last 168 hours. Cardiac Enzymes: Recent Labs  Lab 02/23/18 2055  TROPONINI 0.28*   BNP (last 3 results) No results for input(s): PROBNP in the last 8760 hours. HbA1C: Recent Labs    03/01/18 1328  HGBA1C 4.7*   CBG: Recent Labs  Lab 03/02/18 0410 03/02/18 0511 03/02/18 0745 03/02/18 1153 03/02/18 1259  GLUCAP 64* 86 79 69 91   Lipid Profile: No results for input(s): CHOL, HDL, LDLCALC, TRIG, CHOLHDL, LDLDIRECT in the last 72 hours. Thyroid Function Tests: No results for input(s): TSH, T4TOTAL, FREET4, T3FREE, THYROIDAB in the last 72 hours. Anemia Panel: No results for input(s): VITAMINB12, FOLATE, FERRITIN, TIBC, IRON, RETICCTPCT in the last 72 hours. Sepsis Labs: No results for input(s): PROCALCITON, LATICACIDVEN in the last 168 hours.  Recent Results (from the past 240 hour(s))  MRSA PCR Screening     Status: None   Collection Time: 02/24/18  6:16 AM  Result Value Ref Range Status   MRSA by PCR NEGATIVE NEGATIVE Final    Comment:        The GeneXpert MRSA Assay (FDA approved for NASAL specimens only), is one component of a comprehensive  MRSA colonization surveillance program. It is not intended to diagnose MRSA infection nor to guide or monitor treatment for MRSA infections. Performed at [facility], [address] [place], [place] [address]          Radiology Studies: Dg Chest Port 1 View  Result Date: 03/02/2018 CLINICAL DATA:  shortness of breath EXAM: PORTABLE CHEST - 1 VIEW COMPARISON:  02/23/2018 FINDINGS: Perihilar vascular congestion and interstitial opacities left greater than right as before. Stable cardiomegaly.  Aortic Atherosclerosis (ICD10-170.0) No definite effusion.  No pneumothorax. Visualized bones unremarkable. IMPRESSION: Stable cardiomegaly and perihilar infiltrates or edema left greater  than right. Electronically Signed   By: Corlis Leak M.D.   On: 03/02/2018 11:45   Dg Swallowing Func-speech Pathology  Result Date: 03/01/2018 Objective Swallowing Evaluation: Type of Study: MBS-Modified Barium Swallow Study  Patient Details Name: HARLEM BULA MRN: [id number] Date of Birth: 07/27/1941 Today's Date: 03/01/2018 Time: SLP Start Time (ACUTE ONLY): 1016 -SLP Stop Time (ACUTE ONLY): 1036 SLP Time Calculation (min) (ACUTE ONLY): 20 min Past Medical History: Past Medical History: Diagnosis Date . 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 Arthritis   bursitis in hip . Bilateral inguinal hernia (BIH)   right > left side . 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 Reynolds Bowl . Elevated troponin 04/12/2015 . Full dentures  . Hypertension  . Tobacco abuse  . Urinary tract infection due to Enterococcus 08/03/2015 . Varicocele  Past Surgical History: Past Surgical History: Procedure Laterality Date . AV FISTULA PLACEMENT, BRACHIOCEPHALIC  10/19/2010  LEFT . AV FISTULA PLACEMENT, RADIOCEPHALIC  01/30/2011  Right w/ ligation of competing venous branch by Dr. Leonides Sake . AV FISTULA PLACEMENT, RADIOCEPHALIC  09/13/2010  LEFT . BASCILIC  VEIN TRANSPOSITION Left 04/08/2015  Procedure: BASILIC VEIN TRANSPOSITION;  Surgeon: Pryor Ochoa, MD;  Location: [facility] OR;  Service: Vascular;  Laterality: Left; . COLONOSCOPY   . INGUINAL HERNIA REPAIR Bilateral 11/25/2015  Procedure: LAPAROSCOPIC BILATERAL INGUINAL HERNIA REPAIR--EXCISION OF RIGHT HYDROCELE;  Surgeon: Karie Soda, MD;  Location: [facility] OR;  Service: General;  Laterality: Bilateral; . INSERTION OF MESH Bilateral 11/25/2015  Procedure: INSERTION OF MESH;  Surgeon: Karie Soda, MD;  Location: [facility] OR;  Service: General;  Laterality: Bilateral; . MULTIPLE TOOTH EXTRACTIONS   HPI: The patient is a 77 y.o. year-old with hx AL amyloid, ESRD on HD, atrial fib who presents to ED with sob and resp distress. Per chart missed Wed and Fri Dialysis treatments last week and had been leaving early in treatments prior to that. Found to be grossly volume overloaded and hyperkalemic. CXR Small left pleural effusion noted. Mild left basilar airspace opacity may reflect atelectasis or possibly mild asymmetric interstitial edema.`  No data recorded Assessment / Plan / Recommendation CHL IP CLINICAL IMPRESSIONS 03/01/2018 Clinical Impression Mr Vanpatten demonstrated oropharyngeal phase swallow primarily due to decreased sensation and cognitive deficits impacting swallow function resulting aspiration of thin liquids. Reduced oral cohesion with premature spill and lingual pumping to move to posterior pharynx. Swallows were initiated at the pyriform sinuses and straw sip thin resulted in aspiration as pt speaking with barium in valleculae and pyriform sinuses with cough as well as aspiration with cup. Aspiration event initiated a coughing episode which lasted for 5-7 minutes (coughing every 15 seconds). Pt continued to bring barium into oral cavity after coughs when mouth and pharynx thought to be previously mostly cleared (no significant residue). Esophageal residue suspected, scanned esophagus revealing what appeared to be mild  residue at distal esophagus. Uncertain if originated from esophagus and clear by the time esophagus scanned or mucous from lungs mixed with barium residue in oral cavity. Recommend continue Dys 3 texture, nectar thick liquids, pills whole in applesauce, po's only when alert. ST will continue intervention.  SLP Visit Diagnosis Dysphagia, oropharyngeal phase (R13.12) Attention and concentration deficit following -- Frontal lobe and executive function deficit following -- Impact on safety and function Moderate aspiration risk;Severe aspiration risk   CHL IP TREATMENT RECOMMENDATION 03/01/2018 Treatment Recommendations Therapy as outlined in treatment plan  below   Prognosis 03/01/2018 Prognosis for Safe Diet Advancement Good Barriers to Reach Goals Cognitive deficits Barriers/Prognosis Comment -- CHL IP DIET RECOMMENDATION 03/01/2018 SLP Diet Recommendations Nectar thick liquid;Dysphagia 3 (Mech soft) solids Liquid Administration via Cup;No straw Medication Administration Whole meds with puree Compensations Slow rate;Small sips/bites;Minimize environmental distractions;Lingual sweep for clearance of pocketing Postural Changes Seated upright at 90 degrees;Remain semi-upright after after feeds/meals (Comment)   CHL IP OTHER RECOMMENDATIONS 03/01/2018 Recommended Consults -- Oral Care Recommendations Oral care BID Other Recommendations --   CHL IP FOLLOW UP RECOMMENDATIONS 03/01/2018 Follow up Recommendations Other (comment)   CHL IP FREQUENCY AND DURATION 03/01/2018 Speech Therapy Frequency (ACUTE ONLY) min 2x/week Treatment Duration 2 weeks      CHL IP ORAL PHASE 03/01/2018 Oral Phase Impaired Oral - Pudding Teaspoon -- Oral - Pudding Cup -- Oral - Honey Teaspoon -- Oral - Honey Cup -- Oral - Nectar Teaspoon -- Oral - Nectar Cup Delayed oral transit;Decreased bolus cohesion Oral - Nectar Straw -- Oral - Thin Teaspoon -- Oral - Thin Cup Premature spillage;Decreased bolus cohesion;Lingual pumping Oral - Thin Straw Decreased bolus  cohesion;Premature spillage Oral - Puree -- Oral - Mech Soft -- Oral - Regular Other (Comment) Oral - Multi-Consistency -- Oral - Pill -- Oral Phase - Comment --  CHL IP PHARYNGEAL PHASE 03/01/2018 Pharyngeal Phase Impaired Pharyngeal- Pudding Teaspoon -- Pharyngeal -- Pharyngeal- Pudding Cup -- Pharyngeal -- Pharyngeal- Honey Teaspoon -- Pharyngeal -- Pharyngeal- Honey Cup -- Pharyngeal -- Pharyngeal- Nectar Teaspoon -- Pharyngeal -- Pharyngeal- Nectar Cup Delayed swallow initiation-pyriform sinuses Pharyngeal -- Pharyngeal- Nectar Straw -- Pharyngeal -- Pharyngeal- Thin Teaspoon -- Pharyngeal -- Pharyngeal- Thin Cup Delayed swallow initiation-pyriform sinuses;Penetration/Aspiration during swallow Pharyngeal Material enters airway, remains ABOVE vocal cords and not ejected out Pharyngeal- Thin Straw Penetration/Aspiration during swallow;Delayed swallow initiation-pyriform sinuses Pharyngeal Material enters airway, passes BELOW cords and not ejected out despite cough attempt by patient Pharyngeal- Puree -- Pharyngeal -- Pharyngeal- Mechanical Soft -- Pharyngeal -- Pharyngeal- Regular (No Data) Pharyngeal -- Pharyngeal- Multi-consistency -- Pharyngeal -- Pharyngeal- Pill -- Pharyngeal -- Pharyngeal Comment --  CHL IP CERVICAL ESOPHAGEAL PHASE 03/01/2018 Cervical Esophageal Phase WFL Pudding Teaspoon -- Pudding Cup -- Honey Teaspoon -- Honey Cup -- Nectar Teaspoon -- Nectar Cup -- Nectar Straw -- Thin Teaspoon -- Thin Cup -- Thin Straw -- Puree -- Mechanical Soft -- Regular -- Multi-consistency -- Pill -- Cervical Esophageal Comment -- No flowsheet data found. Royce Macadamia 03/01/2018, 12:15 PM Breck Coons Lonell Face.Ed CCC-SLP Pager [phone]                   Scheduled Meds: . ammonium lactate   Topical Daily  . calcitRIOL  1 mcg Oral Q M,W,F-HD  . calcium acetate  2,001 mg Oral TID WC  . Chlorhexidine Gluconate Cloth  6 each Topical Q0600  . ferric citrate  630 mg Oral TID WC  . heparin  5,000 Units  Subcutaneous Q8H  . ipratropium-albuterol  3 mL Nebulization TID  . LORazepam  1 mg Intravenous Once  . metoprolol succinate  12.5 mg Oral Daily  . nicotine  21 mg Transdermal Daily  . pantoprazole  40 mg Oral Daily  . triamcinolone cream   Topical Daily   Continuous Infusions: . dextrose 40 mL/hr at 03/02/18 1028     LOS: 7 days    Time spent: 35 minutes    Kathlen Mody, MD [facility] Pager [phone] If 7PM-7AM, please contact night-coverage [email or web address] Password [facility] 03/02/2018, 4:29  PM

## 2018-03-02 NOTE — Consult Note (Signed)
Consultation Note Date: 03/02/2018   Patient Name: Shane [name]  DOB: [birth year]  MRN: [id number]  Age / Sex: 77 y.o., male  PCP: Leonides Sake, MD Referring Physician: Hosie Poisson, MD  Reason for Consultation: Establishing goals of care and Psychosocial/spiritual support  HPI/Patient Profile: 77 y.o. 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  Patient had missed 2 dialysis appointments and presented short of breath.  He was dialyzed on 03/01/2018 while inpatient.  Consult ordered for goals of care.   Clinical Assessment and Goals of Care: Patient seen, chart reviewed.  Met with patient's wife and daughter Sharyn Lull for goals of care discussion.  Family paints a picture of a sharp decline over the past month.  He has been on dialysis for approximately 3 years and they are now noticing that he is asking to come off dialysis early or missing appointments.  Daughter Sharyn Lull, shares with me that they have noticed in dialysis that he now no longer recognizes why he is there and is becoming more confused.  She states they have even change the times of his dialysis to try to help him cope with the full treatment.  Discussed at length pathophysiology of vascular dementia as well as patient's already existing difficulty handling dialysis treatments in the setting of worsening dementia and the likely continued episodes of either missing dialysis or being under dialyzed, volume overload, respiratory distress.  Daughter and patient's wife share with me that 3 years ago they had an advance care planning discussion where patient said that he wanted "everything done".  We did talk about how things have changed in the 3-year.,  Beginning dialysis,  and his worsening dementia.  They also are verbalizing that they no longer can care for him at home.  Pt is unable to make his own healthcare decisions at this point.  His healthcare power of attorney would be his wife, but they are making decisions as a family.  Primary point of contact is daughter Carmell Austria at [phone]  Also discussed the role of hospice and comfort measures that can be provided to patient's when they stop dialysis in terms of managing shortness of breath or other associated symptoms.    SUMMARY OF RECOMMENDATIONS   Continue full code, full scope of treatment including hemodialysis Family beginning to recognize that he likely will be unable to continue dialysis in the next several months Family is going to meet together and discuss CODE STATUS as well as other issues relating to patient's advanced dementia, clinical decline Family wishes for patient to go to SNF when medically maximized Hard choices for [employer] booklet as well as MOST form given to the family and reviewed  Code Status/Advance Care Planning:  Full code    Symptom Management:   Agitation: Continue with Ativan 0.5 to 1 mg every 8 hours as needed as well as Haldol.  Please consider  that unmanaged pain could be an aspect of agitation as well.  Trazodone 25 to 50 mg is also an option to consider either scheduled at at bedtime or every 6 as needed  Palliative Prophylaxis:   Aspiration, Bowel Regimen, Delirium Protocol, Frequent Pain Assessment, Oral Care and Turn Reposition  Additional Recommendations (Limitations, Scope, Preferences):  Full Scope Treatment  Psycho-social/Spiritual:   Desire for further Chaplaincy support:no  Additional Recommendations: Referral to Community Resources   Prognosis:   < 6 months in the setting of end-stage renal disease on hemodialysis (unable to tolerate full dialysis treatment and missing appointments), severe vascular dementia, A. fib with  RVR, dysphasia.  Patient is high risk for aspiration pneumonia as well as cardiopulmonary failure.  Patient would certainly qualify for hospice but not as long as he is pursuing hemodialysis.  I did explain this to the family that residential hospice would be an option whent dialysis needs to be stopped  Discharge Planning: SNF      Primary Diagnoses: Present on Admission: . Acute respiratory failure with hypoxia ([place])   I have reviewed the medical record, interviewed the patient and family, and examined the patient. The following aspects are pertinent.  Past Medical History:  Diagnosis Date  . AL amyloid nephropathy ([place])    dx'd around 2011, took chemo, don't have details though  . Arthritis    bursitis in hip  . Bilateral inguinal hernia (BIH)    right > left side  . 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 Meredeth Ide  . Elevated troponin 04/12/2015  . Full dentures   . Hypertension   . Tobacco abuse   . Urinary tract infection due to Enterococcus 08/03/2015  . Varicocele    Social History   Socioeconomic History  . Marital status: Married    Spouse name: Not on file  . Number of children: Not on file  . Years of education: Not on file  . Highest education level: Not on file  Occupational History  . Not on file  Social Needs  . Financial resource strain: Not on file  . Food insecurity:    Worry: Not on file    Inability: Not on file  . Transportation needs:    Medical: Not on file    Non-medical: Not on file  Tobacco Use  . Smoking status: Current Every Day Smoker    Packs/day: 1.00    Years: 50.00    Pack years: 50.00    Types: Cigarettes  . Smokeless tobacco: Never Used  Substance and Sexual Activity  . Alcohol use: No    Alcohol/week: 0.0 oz  . Drug use: No  . Sexual activity: Not on file  Lifestyle  . Physical activity:    Days per week: Not on file    Minutes per session: Not on file  . Stress: Not on file    Relationships  . Social connections:    Talks on phone: Not on file    Gets together: Not on file    Attends religious service: Not on file    Active member of club or organization: Not on file    Attends meetings of clubs or organizations: Not on file    Relationship status: Not on file  Other Topics Concern  . Not on file  Social History Narrative  . Not on file   Family History  Problem Relation Age of Onset  . Asthma Mother   . Hypertension  Mother   . Alcohol abuse Father   . Hypertension Father   . Hypertension Sister   . Diabetes Sister   . Heart disease Sister   . Hypertension Brother   . Peripheral vascular disease Brother   . Hypertension Daughter   . Heart disease Daughter    Scheduled Meds: . ammonium lactate   Topical Daily  . calcitRIOL  1 mcg Oral Q M,W,F-HD  . calcium acetate  2,001 mg Oral TID WC  . Chlorhexidine Gluconate Cloth  6 each Topical Q0600  . ferric citrate  630 mg Oral TID WC  . heparin  5,000 Units Subcutaneous Q8H  . ipratropium-albuterol  3 mL Nebulization TID  . LORazepam  1 mg Intravenous Once  . metoprolol succinate  12.5 mg Oral Daily  . nicotine  21 mg Transdermal Daily  . pantoprazole  40 mg Oral Daily  . triamcinolone cream   Topical Daily   Continuous Infusions: . dextrose 40 mL/hr at 03/02/18 1028   PRN Meds:.acetaminophen **OR** acetaminophen, albuterol, bisacodyl, dextrose, diphenhydrAMINE, haloperidol lactate, LORazepam, ondansetron **OR** ondansetron (ZOFRAN) IV, oxyCODONE, senna-docusate Medications Prior to Admission:  Prior to Admission medications   Medication Sig Start Date End Date Taking? Authorizing Provider  ammonium lactate (AMLACTIN) 12 % cream Apply topically daily. 01/30/18  Yes Georgette Shell, MD  AURYXIA 1 GM 210 MG(Fe) tablet Take 420 mg by mouth 3 (three) times daily with meals.  01/08/18  Yes [provider]  calcitRIOL (ROCALTROL) 0.5 MCG capsule Take 2 capsules (1 mcg total) by mouth every  Monday, Wednesday, and Friday with hemodialysis. 01/30/18  Yes Georgette Shell, MD  calcium acetate (PHOSLO) 667 MG capsule Take 3 capsules (2,001 mg total) by mouth 3 (three) times daily with meals. 01/29/18  Yes Georgette Shell, MD  esomeprazole (NEXIUM) 20 MG capsule Take 20 mg by mouth daily before breakfast. 01/07/18  Yes [provider]  metoprolol succinate (TOPROL-XL) 25 MG 24 hr tablet Take 0.5 tablets (12.5 mg total) by mouth daily. 01/30/18  Yes Georgette Shell, MD  nicotine (NICODERM CQ - DOSED IN MG/24 HOURS) 21 mg/24hr patch Place 1 patch (21 mg total) onto the skin daily. 01/30/18  Yes Georgette Shell, MD  triamcinolone cream (KENALOG) 0.1 % Apply topically daily. 01/30/18  Yes Georgette Shell, MD  dextromethorphan-guaiFENesin [facility] DM) 30-600 MG 12hr tablet Take 1 tablet by mouth 2 (two) times daily as needed for cough. Patient not taking: Reported on 02/23/2018 01/29/18   Georgette Shell, MD   No Known Allergies Review of Systems  Unable to perform ROS: Dementia    Physical Exam  Constitutional:  Acutely ill appearing older man; lethargic, agitated  HENT:  Head: Normocephalic and atraumatic.  Neck: Normal range of motion.  Pulmonary/Chest:  Mild increased work of breathing at rest  Abdominal: He exhibits distension.  Musculoskeletal: Normal range of motion.  Neurological:  Lethargic Speech is garbled, vocal quality weak  Skin: Skin is warm and dry.  Psychiatric:  Agitated otherwise unable to test  Nursing note and vitals reviewed.   Vital Signs: BP 122/84   Pulse 88   Temp 98.2 F (36.8 C) (Axillary)   Resp 17   Wt 59.9 kg (132 lb 0.9 oz)   SpO2 95%   BMI 21.98 kg/m  Pain Scale: 0-10   Pain Score: 0-No pain   SpO2: SpO2: 95 % O2 Device:SpO2: 95 % O2 Flow Rate: .O2 Flow Rate (L/min): 4 L/min  IO: Intake/output summary:  Intake/Output Summary (Last 24 hours) at 03/02/2018 1615 Last data filed at 03/02/2018  1254 Gross per 24 hour  Intake 677.5 ml  Output 2000 ml  Net -1322.5 ml    LBM: Last BM Date: 03/01/18 Baseline Weight: Weight: (UTA, pt on ED stretcher, too unstable to stand) Most recent weight: Weight: 59.9 kg (132 lb 0.9 oz)     Palliative Assessment/Data:   Flowsheet Rows     Most Recent Value  Intake Tab  Referral Department  Hospitalist  Unit at Time of Referral  Intermediate Care Unit  Palliative Care Primary Diagnosis  Nephrology  Date Notified  02/28/18  Reason for referral  Clarify Goals of Care, Psychosocial or Spiritual support  Date of Admission  02/23/18  Date first seen by Palliative Care  03/02/18  # of days Palliative referral response time  2 Day(s)  # of days IP prior to Palliative referral  5  Clinical Assessment  Palliative Performance Scale Score  30%  Pain Max last 24 hours  Not able to report  Pain Min Last 24 hours  Not able to report  Dyspnea Max Last 24 Hours  Not able to report  Dyspnea Min Last 24 hours  Not able to report  Nausea Max Last 24 Hours  Not able to report  Nausea Min Last 24 Hours  Not able to report  Anxiety Max Last 24 Hours  Not able to report  Anxiety Min Last 24 Hours  Not able to report  Other Max Last 24 Hours  Not able to report  Psychosocial & Spiritual Assessment  Palliative Care Outcomes  Patient/Family meeting held?  Yes  Who was at the meeting?  pt's wife and dtr, [place]  Provided psychosocial or spiritual support      Time In: 1400 Time Out: 1510 Time Total: 70 min Greater than 50%  of this time was spent counseling and coordinating care related to the above assessment and plan.  Signed by: Dory Horn, NP   Please contact Palliative Medicine Team phone at [phone] for questions and concerns.  For individual provider: See Shea Evans

## 2018-03-02 NOTE — Progress Notes (Signed)
[facility] NEPHROLOGY PROGRESS NOTE  Assessment/ Plan: Pt is a 77 y.o. yo male  ESRD on HD, afib p/w SOB and respiratory distress.  Assessment/Plan:  #Respiratory distress due to pulmonary edema: On room air today.  # ESRD:MWF.  Had dialysis yesterday with 2 L ultrafiltration tolerated well.  Plan for next dialysis on Monday.     # Anemia: Hemoglobin stable.  # Secondary hyperparathyroidism: Phosphorus level elevated.  Continue PhosLo and increased the dose of Auryxia.  Unknown if patient is taking binders with the food because of his confusion.  On calcitriol.  # HTN/volume: Monitor blood pressure.  On metoprolol.  #Confusion/agitation: Per primary.  No improvement.  I recommend palliative care evaluation to discuss goals of care.  MRI ordered by primary.  Subjective: Seen and examined at bedside.  Remains confused.  No new event. Objective Vital signs in last 24 hours: Vitals:   03/02/18 0414 03/02/18 0752 03/02/18 0905 03/02/18 1029  BP: (!) 126/104 104/71  123/76  Pulse: 65 (!) 125  (!) 110  Resp: 20 (!) 21    Temp: 98.8 F (37.1 C) 97.8 F (36.6 C)    TempSrc: Axillary Axillary    SpO2: 100% 99% 98%   Weight:       Weight change: -1.7 kg (-3 lb 12 oz)  Intake/Output Summary (Last 24 hours) at 03/02/2018 1134 Last data filed at 03/02/2018 0900 Gross per 24 hour  Intake 557.5 ml  Output 2000 ml  Net -1442.5 ml       Labs: Basic Metabolic Panel: Recent Labs  Lab 02/27/18 1328 02/28/18 0800  03/01/18 1050 03/01/18 1328 03/02/18 0923  NA 140 137  --  136 135 135  K 4.1 4.1  --  4.1 3.9 4.0  CL 97* 97*  --  94* 96* 94*  CO2 31 27  --  GLUCOSE 96 94   < > 92 91 89  BUN 21* 28*  --  CREATININE 7.53* 8.45*  --  6.05* 6.13* 4.90*  CALCIUM 8.5* 8.8*  --  8.6* 8.7* 8.9  PHOS 6.1* 7.2*  --   --  4.7*  --    < > = values in this interval not displayed.   Liver Function Tests: Recent Labs  Lab 02/27/18 1328 02/28/18 0800  03/01/18 1328  ALBUMIN 3.0* 3.0* 3.1*   No results for input(s): LIPASE, AMYLASE in the last 168 hours. Recent Labs  Lab 02/26/18 0719  AMMONIA 17   CBC: Recent Labs  Lab 02/26/18 0759 02/27/18 1328 02/28/18 0809 03/01/18 1328 03/02/18 0923  WBC 5.2 7.2 6.2 5.7 6.1  NEUTROABS  --   --  4.4  --   --   HGB 9.8* 10.5* 10.1* 10.4* 10.4*  HCT 31.2* 33.1* 31.6* 32.5* 32.0*  MCV 101.6* 102.2* 101.3* 101.2* 99.1  PLT 98* 121* 125* 137* 147*   Cardiac Enzymes: Recent Labs  Lab 02/23/18 2055  TROPONINI 0.28*   CBG: Recent Labs  Lab 03/02/18 0031 03/02/18 0055 03/02/18 0410 03/02/18 0511 03/02/18 0745  GLUCAP 58* 120* 64* 86 79    Iron Studies: No results for input(s): IRON, TIBC, TRANSFERRIN, FERRITIN in the last 72 hours. Studies/Results: Dg Swallowing Func-speech Pathology  Result Date: 03/01/2018 Objective Swallowing Evaluation: Type of Study: MBS-Modified Barium Swallow Study  Patient Details Name: KWELI GRASSEL MRN: [id number] Date of Birth: [birth year] Today's Date: 03/01/2018 Time: SLP Start Time (ACUTE ONLY): 1016 -SLP Stop Time (ACUTE ONLY):  1036 SLP Time Calculation (min) (ACUTE ONLY): 20 min Past Medical History: Past Medical History: Diagnosis Date . 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 Arthritis   bursitis in hip . Bilateral inguinal hernia (BIH)   right > left side . 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 Reynolds Bowl . Elevated troponin 04/12/2015 . Full dentures  . Hypertension  . Tobacco abuse  . Urinary tract infection due to Enterococcus 08/03/2015 . Varicocele  Past Surgical History: Past Surgical History: Procedure Laterality Date . AV FISTULA PLACEMENT, BRACHIOCEPHALIC  10/19/2010  LEFT . AV FISTULA PLACEMENT, RADIOCEPHALIC  01/30/2011  Right w/ ligation of competing venous branch by Dr. Leonides Sake . AV FISTULA PLACEMENT, RADIOCEPHALIC  09/13/2010  LEFT . BASCILIC VEIN  TRANSPOSITION Left 04/08/2015  Procedure: BASILIC VEIN TRANSPOSITION;  Surgeon: Pryor Ochoa, MD;  Location: [facility] OR;  Service: Vascular;  Laterality: Left; . COLONOSCOPY   . INGUINAL HERNIA REPAIR Bilateral 11/25/2015  Procedure: LAPAROSCOPIC BILATERAL INGUINAL HERNIA REPAIR--EXCISION OF RIGHT HYDROCELE;  Surgeon: Karie Soda, MD;  Location: [facility] OR;  Service: General;  Laterality: Bilateral; . INSERTION OF MESH Bilateral 11/25/2015  Procedure: INSERTION OF MESH;  Surgeon: Karie Soda, MD;  Location: [facility] OR;  Service: General;  Laterality: Bilateral; . MULTIPLE TOOTH EXTRACTIONS   HPI: The patient is a 77 y.o. year-old with hx AL amyloid, ESRD on HD, atrial fib who presents to ED with sob and resp distress. Per chart missed Wed and Fri Dialysis treatments last week and had been leaving early in treatments prior to that. Found to be grossly volume overloaded and hyperkalemic. CXR Small left pleural effusion noted. Mild left basilar airspace opacity may reflect atelectasis or possibly mild asymmetric interstitial edema.`  No data recorded Assessment / Plan / Recommendation CHL IP CLINICAL IMPRESSIONS 03/01/2018 Clinical Impression Mr Coats demonstrated oropharyngeal phase swallow primarily due to decreased sensation and cognitive deficits impacting swallow function resulting aspiration of thin liquids. Reduced oral cohesion with premature spill and lingual pumping to move to posterior pharynx. Swallows were initiated at the pyriform sinuses and straw sip thin resulted in aspiration as pt speaking with barium in valleculae and pyriform sinuses with cough as well as aspiration with cup. Aspiration event initiated a coughing episode which lasted for 5-7 minutes (coughing every 15 seconds). Pt continued to bring barium into oral cavity after coughs when mouth and pharynx thought to be previously mostly cleared (no significant residue). Esophageal residue suspected, scanned esophagus revealing what appeared to be mild residue  at distal esophagus. Uncertain if originated from esophagus and clear by the time esophagus scanned or mucous from lungs mixed with barium residue in oral cavity. Recommend continue Dys 3 texture, nectar thick liquids, pills whole in applesauce, po's only when alert. ST will continue intervention.  SLP Visit Diagnosis Dysphagia, oropharyngeal phase (R13.12) Attention and concentration deficit following -- Frontal lobe and executive function deficit following -- Impact on safety and function Moderate aspiration risk;Severe aspiration risk   CHL IP TREATMENT RECOMMENDATION 03/01/2018 Treatment Recommendations Therapy as outlined in treatment plan below   Prognosis 03/01/2018 Prognosis for Safe Diet Advancement Good Barriers to Reach Goals Cognitive deficits Barriers/Prognosis Comment -- CHL IP DIET RECOMMENDATION 03/01/2018 SLP Diet Recommendations Nectar thick liquid;Dysphagia 3 (Mech soft) solids Liquid Administration via Cup;No straw Medication Administration Whole meds with puree Compensations Slow rate;Small sips/bites;Minimize environmental distractions;Lingual sweep for clearance of pocketing Postural Changes Seated upright at 90 degrees;Remain  semi-upright after after feeds/meals (Comment)   CHL IP OTHER RECOMMENDATIONS 03/01/2018 Recommended Consults -- Oral Care Recommendations Oral care BID Other Recommendations --   CHL IP FOLLOW UP RECOMMENDATIONS 03/01/2018 Follow up Recommendations Other (comment)   CHL IP FREQUENCY AND DURATION 03/01/2018 Speech Therapy Frequency (ACUTE ONLY) min 2x/week Treatment Duration 2 weeks      CHL IP ORAL PHASE 03/01/2018 Oral Phase Impaired Oral - Pudding Teaspoon -- Oral - Pudding Cup -- Oral - Honey Teaspoon -- Oral - Honey Cup -- Oral - Nectar Teaspoon -- Oral - Nectar Cup Delayed oral transit;Decreased bolus cohesion Oral - Nectar Straw -- Oral - Thin Teaspoon -- Oral - Thin Cup Premature spillage;Decreased bolus cohesion;Lingual pumping Oral - Thin Straw Decreased bolus  cohesion;Premature spillage Oral - Puree -- Oral - Mech Soft -- Oral - Regular Other (Comment) Oral - Multi-Consistency -- Oral - Pill -- Oral Phase - Comment --  CHL IP PHARYNGEAL PHASE 03/01/2018 Pharyngeal Phase Impaired Pharyngeal- Pudding Teaspoon -- Pharyngeal -- Pharyngeal- Pudding Cup -- Pharyngeal -- Pharyngeal- Honey Teaspoon -- Pharyngeal -- Pharyngeal- Honey Cup -- Pharyngeal -- Pharyngeal- Nectar Teaspoon -- Pharyngeal -- Pharyngeal- Nectar Cup Delayed swallow initiation-pyriform sinuses Pharyngeal -- Pharyngeal- Nectar Straw -- Pharyngeal -- Pharyngeal- Thin Teaspoon -- Pharyngeal -- Pharyngeal- Thin Cup Delayed swallow initiation-pyriform sinuses;Penetration/Aspiration during swallow Pharyngeal Material enters airway, remains ABOVE vocal cords and not ejected out Pharyngeal- Thin Straw Penetration/Aspiration during swallow;Delayed swallow initiation-pyriform sinuses Pharyngeal Material enters airway, passes BELOW cords and not ejected out despite cough attempt by patient Pharyngeal- Puree -- Pharyngeal -- Pharyngeal- Mechanical Soft -- Pharyngeal -- Pharyngeal- Regular (No Data) Pharyngeal -- Pharyngeal- Multi-consistency -- Pharyngeal -- Pharyngeal- Pill -- Pharyngeal -- Pharyngeal Comment --  CHL IP CERVICAL ESOPHAGEAL PHASE 03/01/2018 Cervical Esophageal Phase WFL Pudding Teaspoon -- Pudding Cup -- Honey Teaspoon -- Honey Cup -- Nectar Teaspoon -- Nectar Cup -- Nectar Straw -- Thin Teaspoon -- Thin Cup -- Thin Straw -- Puree -- Mechanical Soft -- Regular -- Multi-consistency -- Pill -- Cervical Esophageal Comment -- No flowsheet data found. Royce Macadamia 03/01/2018, 12:15 PM Breck Coons Lonell Face.Ed CCC-SLP Pager [phone]               Medications: Infusions: . dextrose 40 mL/hr at 03/02/18 1028    Scheduled Medications: . ammonium lactate   Topical Daily  . calcitRIOL  1 mcg Oral Q M,W,F-HD  . calcium acetate  2,001 mg Oral TID WC  . Chlorhexidine Gluconate Cloth  6 each Topical  Q0600  . ferric citrate  630 mg Oral TID WC  . heparin  5,000 Units Subcutaneous Q8H  . ipratropium-albuterol  3 mL Nebulization TID  . LORazepam  1 mg Intravenous Once  . metoprolol succinate  12.5 mg Oral Daily  . nicotine  21 mg Transdermal Daily  . pantoprazole  40 mg Oral Daily  . triamcinolone cream   Topical Daily    have reviewed scheduled and prn medications.  Physical Exam: General: Confused elderly male, not in distress Heart: Regular rate rhythm S1-S2 normal Lungs: Clear bilateral, no wheezing or crackle Abdomen:soft, Non-tender, non-distended Extremities:No edema Dialysis Access: Right upper extremity AV fistula has good thrill and bruit  Aarti Mankowski Prasad Tabbitha Janvrin 03/02/2018,11:34 AM  LOS: 7 days

## 2018-03-02 NOTE — Progress Notes (Signed)
Pt's CBG was 64. I administered the remaining 25 ml of dextrose 50%. I rechecked it 30 mins later, CBG was 86. Will continue to monitor.  Judithann Sheen, RN

## 2018-03-03 ENCOUNTER — Inpatient Hospital Stay (HOSPITAL_COMMUNITY): Payer: Medicare Other

## 2018-03-03 DIAGNOSIS — Z515 Encounter for palliative care: Secondary | ICD-10-CM

## 2018-03-03 LAB — GLUCOSE, CAPILLARY
GLUCOSE-CAPILLARY: 117 mg/dL — AB (ref 65–99)
GLUCOSE-CAPILLARY: 123 mg/dL — AB (ref 65–99)
GLUCOSE-CAPILLARY: 69 mg/dL (ref 65–99)
GLUCOSE-CAPILLARY: 78 mg/dL (ref 65–99)
Glucose-Capillary: 92 mg/dL (ref 65–99)
Glucose-Capillary: 125 mg/dL — ABNORMAL HIGH (ref 65–99)
Glucose-Capillary: 112 mg/dL — ABNORMAL HIGH (ref 65–99)

## 2018-03-03 MED ORDER — VANCOMYCIN HCL 500 MG IV SOLR
500.0000 mg | INTRAVENOUS | Status: DC
  Filled 2018-03-03: qty 500

## 2018-03-03 MED ORDER — PIPERACILLIN-TAZOBACTAM 3.375 G IVPB
3.3750 g | Freq: Two times a day (BID) | INTRAVENOUS | Status: DC
  Administered 2018-03-04 – 2018-03-06 (×5): 3.375 g via INTRAVENOUS
  Filled 2018-03-03 (×7): qty 50

## 2018-03-03 MED ORDER — CHLORHEXIDINE GLUCONATE CLOTH 2 % EX PADS: 6.0000 | MEDICATED_PAD | Freq: Every day | CUTANEOUS | Status: DC

## 2018-03-03 MED ORDER — SODIUM CHLORIDE 0.9 % IV SOLN
3.0000 g | Freq: Two times a day (BID) | INTRAVENOUS | Status: DC
  Administered 2018-03-03: 3 g via INTRAVENOUS
  Filled 2018-03-03 (×2): qty 3

## 2018-03-03 MED ORDER — SODIUM CHLORIDE 0.9 % IV SOLN
500.0000 mg | Freq: Once | INTRAVENOUS | Status: AC
  Administered 2018-03-03: 500 mg via INTRAVENOUS
  Filled 2018-03-03 (×3): qty 500

## 2018-03-03 NOTE — Progress Notes (Addendum)
Daily Progress Note   Patient Name: Shane [name]       Date: 03/03/2018 DOB: August 09, 1941  Age: 77 y.o. MRN#: [id number] Attending Physician: Kathlen Mody, MD Primary Care Physician: [name], MD Admit Date: 02/23/2018  Reason for Consultation/Follow-up: Establishing goals of care and Psychosocial/spiritual support  Subjective: Patient seen, chart reviewed.  Patient is more alert this morning and can answer some simple yes and no questions.  He denied pain or shortness of breath.  He is very weak.  He is unable to even help reposition himself in bed.  Nursing was able to get him to eat apple sauce as well as orange juice.  He continues to be hypoglycemic requiring D50 on a daily basis. Per chart review, patient became quite tachycardic last night with a rate in the 130s as well as febrile with temperature of 101.3   Length of Stay: 8  Current Medications: Scheduled Meds:  . ammonium lactate   Topical Daily  . calcitRIOL  1 mcg Oral Q M,W,F-HD  . calcium acetate  2,001 mg Oral TID WC  . Chlorhexidine Gluconate Cloth  6 each Topical Q0600  . ferric citrate  630 mg Oral TID WC  . heparin  5,000 Units Subcutaneous Q8H  . LORazepam  1 mg Intravenous Once  . metoprolol succinate  12.5 mg Oral Daily  . nicotine  21 mg Transdermal Daily  . pantoprazole  40 mg Oral Daily  . triamcinolone cream   Topical Daily    Continuous Infusions: . dextrose 40 mL/hr at 03/02/18 1600    PRN Meds: acetaminophen **OR** acetaminophen, albuterol, bisacodyl, dextrose, diphenhydrAMINE, haloperidol lactate, LORazepam, ondansetron **OR** ondansetron (ZOFRAN) IV, oxyCODONE, senna-docusate  Physical Exam  Constitutional:  Acutely ill-appearing older man Febrile overnight Poor to almost no p.o.  intake requiring D50 secondary to hypoglycemia  HENT:  Head: Normocephalic and atraumatic.  Neck: Normal range of motion.  Cardiovascular:  Tachycardic Heart rate last night as high as 138  Pulmonary/Chest:  Poor respiratory effort  Abdominal: Soft.  Musculoskeletal:  Very weak Unable to assist with repositioning in the bed  Neurological:  Somnolent but does answer simple yes and no questions today  Skin: Skin is warm and dry.  Psychiatric:  Still experiencing periods of psychomotor restlessness requiring Ativan as well as Haldol  Nursing note  and vitals reviewed.           Vital Signs: BP 105/64 (BP Location: Right Arm)   Pulse (!) 121   Temp (!) 100.6 F (38.1 C) (Axillary)   Resp 16   Wt 59.9 kg (132 lb 0.9 oz)   SpO2 95%   BMI 21.98 kg/m  SpO2: SpO2: 95 % O2 Device: O2 Device: Room Air O2 Flow Rate: O2 Flow Rate (L/min): 4 L/min  Intake/output summary:   Intake/Output Summary (Last 24 hours) at 03/03/2018 1040 Last data filed at 03/02/2018 1700 Gross per 24 hour  Intake 238 ml  Output 0 ml  Net 238 ml   LBM: Last BM Date: 03/01/18 Baseline Weight: Weight: (UTA, pt on ED stretcher, too unstable to stand) Most recent weight: Weight: 59.9 kg (132 lb 0.9 oz)       Palliative Assessment/Data:    Flowsheet Rows     Most Recent Value  Intake Tab  Referral Department  Hospitalist  Unit at Time of Referral  Intermediate Care Unit  Palliative Care Primary Diagnosis  Nephrology  Date Notified  02/28/18  Reason for referral  Clarify Goals of Care, Psychosocial or Spiritual support  Date of Admission  02/23/18  Date first seen by Palliative Care  03/02/18  # of days Palliative referral response time  2 Day(s)  # of days IP prior to Palliative referral  5  Clinical Assessment  Palliative Performance Scale Score  30%  Pain Max last 24 hours  Not able to report  Pain Min Last 24 hours  Not able to report  Dyspnea Max Last 24 Hours  Not able to report  Dyspnea  Min Last 24 hours  Not able to report  Nausea Max Last 24 Hours  Not able to report  Nausea Min Last 24 Hours  Not able to report  Anxiety Max Last 24 Hours  Not able to report  Anxiety Min Last 24 Hours  Not able to report  Other Max Last 24 Hours  Not able to report  Psychosocial & Spiritual Assessment  Palliative Care Outcomes  Patient/Family meeting held?  Yes  Who was at the meeting?  pt's wife and dtr, [facility]  Palliative Care Outcomes  Provided psychosocial or spiritual support      Patient Active Problem List   Diagnosis Date Noted  . SOB (shortness of breath)   . Hypoxia   . Acute delirium   . Dementia with behavioral disturbance   . GERD (gastroesophageal reflux disease) 01/25/2018  . Diarrhea 01/25/2018  . Abdominal pain 01/25/2018  . Acute respiratory failure with hypoxia (HCC) 01/25/2018  . Thrombocytopenia (HCC) 01/25/2018  . Atrial fibrillation, chronic (HCC)   . Atrial fibrillation (HCC) 06/01/2017  . A-fib (HCC) 06/01/2017  . Atrial fibrillation with RVR (HCC) 06/29/2016  . Adjustment disorder with other symptom   . Toxic metabolic encephalopathy 12/01/2015  . Bilateral inguinal hernia (BIH) s/p lap repair w mesh 11/25/2015 11/25/2015  . Hydrocele s/p partial rexction/unroofing 11/25/2015 11/25/2015  . 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 Pulmonary hypertension (HCC) 08/04/2015  . Essential hypertension   . Acute encephalopathy 04/12/2015  . ESRD needing dialysis (HCC) 04/12/2015  . Anorexia 04/12/2015  . Amyloidosis (HCC) 04/12/2015  . Uremia 04/12/2015  . Tobacco abuse 02/09/2013  . Hypertension 02/09/2013    Palliative Care Assessment & Plan   Patient Profile: 77 y.o. 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  Patient had missed 2  dialysis appointments and presented short of breath.  He was dialyzed on 03/01/2018 while inpatient.  Consult ordered for goals of care.  Family is struggling with end-of-life issues in terms of balancing " not wanting him to suffer" with prior conversations that he has had with family where he is told him to "do everything".  Assessment: I did speak to daughter Marcelino Duster this morning.  She states they did have a family meeting last night and reviewed the MOST form as well as talked about funeral arrangements.  I did share with Marcelino Duster that I was quite worried about her father being able to survive this hospitalization as well as my concern with remaining a full code.  Emphasized again, DNR did not reference do not treat, he would still continue dialysis.  Also updated Marcelino Duster that patient is now febrile with possible pneumonia.  Family states they wish him to remain a full code as they are  struggling with a conversation that he had with their mother approximately 3 years ago, where he stated that he would want everything to be done to prolong his life including CPR, defibrillation and intubation.  They on one hand do not want to see him suffer but are having trouble making that decision for even DNR.  Marcelino Duster honestly states that with him being  conversant with family, his situation "does not seem real".  She also stated that if they were see him in ICU after a full code she would anticipate at that point "the reality will hit [place]" and indicated that they at that point would make different decisions.  Recommendations/Plan:  Palliative medicine to stay involved in continued goals of care conversations.  My sense from talking to patient's daughter, Marcelino Duster, this morning is that as long as patient is somewhat responsive they will have a difficult time moving forward with any changes to goals of care including CODE STATUS.  Family is however beginning to meet together, at least discuss end-of-life, but  clearly needs additional support and assistance as patient clinically declines  I recommended DNR, but at this point family is still opting for full code  Goals of Care and Additional Recommendations:  Limitations on Scope of Treatment: Full Scope Treatment  Code Status:    Code Status Orders  (From admission, onward)        Start     Ordered   02/24/18 0513  Full code  Continuous     02/24/18 0512    Code Status History    Date Active Date Inactive Code Status Order ID Comments User Context   01/25/2018 2015 01/29/2018 1853 Full Code [id number]  Lorretta Harp, MD ED   06/01/2017 2001 06/04/2017 2255 Full Code [id number]  Marthenia Rolling, DO Inpatient   06/29/2016 0151 06/29/2016 2021 Full Code [id number]  Bobette Mo, MD Inpatient   08/01/2015 1721 08/05/2015 2321 Full Code [id number]  Marrian Salvage, MD Inpatient   04/12/2015 2144 04/15/2015 1940 Full Code [id number]  Delano Metz, MD Inpatient   02/09/2013 0914 02/11/2013 1705 Full Code [id number]  Belia Heman, MD Inpatient       Prognosis:  < 6 months in the setting of end-stage renal disease on hemodialysis (unable to tolerate full dialysis treatment and missing appointments), severe vascular dementia, A. fib with RVR, dysphasia.  Patient is high risk for aspiration pneumonia as well as cardiopulmonary  failure.  Patient would certainly qualify for hospice but not as long as he is pursuing hemodialysis.  I did explain this to the family that residential hospice would be an option whent dialysis needs to be stopped.  He is extremely high risk for an acute cardiopulmonary event thus potentially changing patient's prognosis   Discharge Planning:  Skilled Nursing Facility for rehab with Palliative care service follow-up that I am concerned given patient's clinical status, overall fragility that he may not make it out of the hospital   Thank you for allowing the Palliative Medicine Team to assist in the care of this patient.   Time  In: 1000 Time Out: 1035 Total Time 35 min Prolonged Time Billed  no       Greater than 50%  of this time was spent counseling and coordinating care related to the above assessment and plan.  Irean Hong, NP  Please contact Palliative Medicine Team phone at [phone] for questions and concerns.

## 2018-03-03 NOTE — Progress Notes (Signed)
Pt cbg 0450 was 69. Amp of D 50 given. Recheck was 123. Will continue to monitor

## 2018-03-03 NOTE — Progress Notes (Signed)
Pharmacy Antibiotic Note  Shane [name] is a 77 y.o. male admitted on 02/23/2018 with pneumonia.  Pharmacy has been consulted for Unasyn dosing. Patinet is ESRD w/ HD MWF. Febrile and CXR shows infiltrates.   Plan: Unasyn 3G IV q12 hours Monitor clinical progression and LOT  Weight: 132 lb 0.9 oz (59.9 kg)  Temp (24hrs), Avg:99.8 F (37.7 C), Min:98.2 F (36.8 C), Max:101.3 F (38.5 C)  Recent Labs  Lab 02/26/18 0759  02/27/18 1328 02/28/18 0800 02/28/18 0809 03/01/18 1050 03/01/18 1328 03/02/18 0923  WBC 5.2  --  7.2  --  6.2  --  5.7 6.1  CREATININE  --    < > 7.53* 8.45*  --  6.05* 6.13* 4.90*   < > = values in this interval not displayed.    Estimated Creatinine Clearance: 10.9 mL/min (A) (by C-G formula based on SCr of 4.9 mg/dL (H)).    No Known Allergies   Thank you for allowing pharmacy to be a part of this patient's care.  Shane [name] 03/03/2018 11:06 AM

## 2018-03-03 NOTE — Progress Notes (Signed)
PROGRESS NOTE    Shane [name]  ZOX:[id number] DOB: [birth year] DOA: 02/23/2018 PCP: [name], MD    Brief Narrative:77 y.o.malewith a history of ESRD 2/2 amyloid nephropathy, HTN, afib, medication noncompliance, and dementiawho presented w/ respiratory distress after having missed his last 2 HD appointments.  In the ED he was found to be grossly volume overloaded and hyperkalemic. He was admitted and underwent HD treatments.     Assessment & Plan:   Active Problems:   ESRD needing dialysis (HCC)   Goals of care, counseling/discussion   Acute respiratory failure with hypoxia (HCC)   SOB (shortness of breath)   Hypoxia   Acute delirium   Dementia with behavioral disturbance   Palliative care by specialist   ESRD with non compliance to HD: Came in with fluid overload, secondary to missing HD sessions.   Nephrology on board and assisting with management.    Moderate dementia with behavioral abnormalities: Pt continues to be intermittently somnolent, lethargic with periods of agitation and restlessness. He has minimal po intake with multiple episodes of hypoglycemia requiring the use of dextrose fluids and pushes of dextrose.  Palliative care consulted and recommendations given.    Hypertension:  Well controlled.   Hypoglycemia:  Persistent, multiple times. Very decreased po intake all day for the last 4 days. Started him on dextrose fluids, but concerned about fluid overload and pulm edema.   Chronic atrial fibrillation Rate controlled.  Not a candidate for anti coagulation in view of the non compliance.    Dysphagia of unclear etiology.  ordered MRI brain for further evaluation.  SLP evaluation recommending dysphagia 3 diet with nectar thick liquids .  MBS done recommending dysphagia 3 diet. But pt is either lethargic or confused to eat.    Hyperkalemia resolved.    Acute respiratory failure with hypoxia sec to pulm edema , improving, transtioned off  oxygen.   Anemia of chronic disease:  Hemoglobin stable at 10.   COPD  No wheezing heard.   Fever today, and CXR showed left lower lobe pneumonia.  Blood cultures ordered and pending.    DVT prophylaxis: heparin. Code Status: full code.  Family Communication: none at bedside today Disposition Plan: family wants to see him go to SNF.    Consultants:   Nephrology  slp evaluation.   Palliative care.     Procedures: HDMWF  Antimicrobials: NONE.   Subjective: MORE awake today. Saying yes or no to simple questions   Objective: Vitals:   03/03/18 0802 03/03/18 0924 03/03/18 1002 03/03/18 1149  BP: 105/64   117/77  Pulse: (!) 115 (!) 109 (!) 121 91  Resp: Temp: (!) 100.6 F (38.1 C)   97.9 F (36.6 C)  TempSrc: Axillary   Oral  SpO2: 96% 95%  93%  Weight:        Intake/Output Summary (Last 24 hours) at 03/03/2018 1615 Last data filed at 03/02/2018 1700 Gross per 24 hour  Intake 118 ml  Output -  Net 118 ml   Filed Weights   02/28/18 1146 03/01/18 1315 03/01/18 1617  Weight: 59.9 kg (132 lb 0.9 oz) 61.8 kg (136 lb 3.9 oz) 59.9 kg (132 lb 0.9 oz)    Examination:  General exam: Appears calm ill appearing, waking up to verbal cues, answering to yes or no.  Respiratory system: diminished at bases. No wheezing or rhonchi.  Cardiovascular system: S1 & S2 heard, RRR. No JVD,  No pedal edema. Gastrointestinal system:  Abdomen is soft non tender non distended bs+ Central nervous system: confused, but answering to simple questions.  Extremities: no pedal edema, or cyanosis.  Skin: No rashes, lesions or ulcers Psychiatry:  No agitation today.     Data Reviewed: I have personally reviewed following labs and imaging studies  CBC: Recent Labs  Lab 02/26/18 0759 02/27/18 1328 02/28/18 0809 03/01/18 1328 03/02/18 0923  WBC 5.2 7.2 6.2 5.7 6.1  NEUTROABS  --   --  4.4  --   --   HGB 9.8* 10.5* 10.1* 10.4* 10.4*  HCT 31.2* 33.1* 31.6* 32.5* 32.0*    MCV 101.6* 102.2* 101.3* 101.2* 99.1  PLT 98* 121* 125* 137* 147*   Basic Metabolic Panel: Recent Labs  Lab 02/26/18 0800 02/27/18 1328 02/28/18 0800 02/28/18 1348 03/01/18 1050 03/01/18 1328 03/02/18 0923  NA 138 140 137  --  136 135 135  K 4.4 4.1 4.1  --  4.1 3.9 4.0  CL 95* 97* 97*  --  94* 96* 94*  CO2 --  GLUCOSE 97 96 94 89 92 91 89  BUN 37* 21* 28*  --  CREATININE 10.41* 7.53* 8.45*  --  6.05* 6.13* 4.90*  CALCIUM 8.4* 8.5* 8.8*  --  8.6* 8.7* 8.9  PHOS 8.2* 6.1* 7.2*  --   --  4.7*  --    GFR: Estimated Creatinine Clearance: 10.9 mL/min (A) (by C-G formula based on SCr of 4.9 mg/dL (H)). Liver Function Tests: Recent Labs  Lab 02/26/18 0800 02/27/18 1328 02/28/18 0800 03/01/18 1328  ALBUMIN 3.1* 3.0* 3.0* 3.1*   No results for input(s): LIPASE, AMYLASE in the last 168 hours. Recent Labs  Lab 02/26/18 0719  AMMONIA 17   Coagulation Profile: No results for input(s): INR, PROTIME in the last 168 hours. Cardiac Enzymes: No results for input(s): CKTOTAL, CKMB, CKMBINDEX, TROPONINI in the last 168 hours. BNP (last 3 results) No results for input(s): PROBNP in the last 8760 hours. HbA1C: Recent Labs    03/01/18 1328  HGBA1C 4.7*   CBG: Recent Labs  Lab 03/03/18 0016 03/03/18 0450 03/03/18 0537 03/03/18 0800 03/03/18 1149  GLUCAP 78 69 123* 92 125*   Lipid Profile: No results for input(s): CHOL, HDL, LDLCALC, TRIG, CHOLHDL, LDLDIRECT in the last 72 hours. Thyroid Function Tests: No results for input(s): TSH, T4TOTAL, FREET4, T3FREE, THYROIDAB in the last 72 hours. Anemia Panel: No results for input(s): VITAMINB12, FOLATE, FERRITIN, TIBC, IRON, RETICCTPCT in the last 72 hours. Sepsis Labs: No results for input(s): PROCALCITON, LATICACIDVEN in the last 168 hours.  Recent Results (from the past 240 hour(s))  MRSA PCR Screening     Status: None   Collection Time: 02/24/18  6:16 AM  Result Value Ref Range Status    MRSA by PCR NEGATIVE NEGATIVE Final    Comment:        The GeneXpert MRSA Assay (FDA approved for NASAL specimens only), is one component of a comprehensive MRSA colonization surveillance program. It is not intended to diagnose MRSA infection nor to guide or monitor treatment for MRSA infections. Performed at [facility], [address] [place], [place] [address]          Radiology Studies: Dg Chest Port 1 View  Result Date: 03/03/2018 CLINICAL DATA:  Aspiration pneumonia.  Fever. EXAM: PORTABLE CHEST 1 VIEW COMPARISON:  03/02/2018 FINDINGS: Left lower lobe consolidation. Right lung is clear. No pneumothorax. No pleural  effusion. Mild cardiomegaly. Vascular congestion without interstitial edema. Nodular density at the right lung base may represent a nipple shadow. IMPRESSION: Left lower lobe pneumonia. Cardiomegaly and vascular congestion. Nodular density at the right lung base. Attention on follow-up radiograph is recommended. Electronically Signed   By: Jolaine Click M.D.   On: 03/03/2018 11:04   Dg Chest Port 1 View  Result Date: 03/02/2018 CLINICAL DATA:  shortness of breath EXAM: PORTABLE CHEST - 1 VIEW COMPARISON:  02/23/2018 FINDINGS: Perihilar vascular congestion and interstitial opacities left greater than right as before. Stable cardiomegaly.  Aortic Atherosclerosis (ICD10-170.0) No definite effusion.  No pneumothorax. Visualized bones unremarkable. IMPRESSION: Stable cardiomegaly and perihilar infiltrates or edema left greater than right. Electronically Signed   By: Corlis Leak M.D.   On: 03/02/2018 11:45        Scheduled Meds: . ammonium lactate   Topical Daily  . calcitRIOL  1 mcg Oral Q M,W,F-HD  . calcium acetate  2,001 mg Oral TID WC  . Chlorhexidine Gluconate Cloth  6 each Topical Q0600  . ferric citrate  630 mg Oral TID WC  . heparin  5,000 Units Subcutaneous Q8H  . LORazepam  1 mg Intravenous Once  . metoprolol succinate  12.5 mg Oral Daily  .  nicotine  21 mg Transdermal Daily  . pantoprazole  40 mg Oral Daily  . triamcinolone cream   Topical Daily   Continuous Infusions: . dextrose 40 mL/hr (03/03/18 1324)     LOS: 8 days    Time spent: 35 minutes    Kathlen Mody, MD [facility] Pager [phone] If 7PM-7AM, please contact night-coverage [email or web address] Password [facility] 03/03/2018, 4:15 PM

## 2018-03-03 NOTE — Progress Notes (Addendum)
[name] NEPHROLOGY PROGRESS NOTE  Assessment/ Plan: Pt is a 77 y.o. yo male  ESRD on HD, afib p/w SOB and respiratory distress.  Assessment/Plan:  #Respiratory distress due to pulmonary edema: On room air today.  # ESRD:MWF.  Has been tolerating dialysis well.  Plan for next dialysis tomorrow.   # Anemia: Hemoglobin stable.  Hb 10.4.  # Secondary hyperparathyroidism: Phosphorus level acceptable.  Continue binders. On calcitriol.  # HTN/volume: Monitor blood pressure.  On metoprolol.  #Confusion/agitation: No improvement.  Evaluated by palliative care noted.  Patient is full code.  #Acute febrile illness: Fever noted last night.  Chest x-ray with perihilar infiltrates, referred to primary team for the evaluation of possible aspiration pneumonia.  No cough.  Subjective: Seen and examined at bedside.  He is more alert awake than other days.  Review of systems limited. Objective Vital signs in last 24 hours: Vitals:   03/03/18 0324 03/03/18 0624 03/03/18 0802 03/03/18 0924  BP: (!) 142/79  105/64   Pulse: (!) 113 (!) 138 (!) 115 (!) 109  Resp: (!) 22 (!) Temp: (!) 101.3 F (38.5 C) (!) 100.7 F (38.2 C) (!) 100.6 F (38.1 C)   TempSrc: Axillary Axillary Axillary   SpO2: 94% 94% 96% 95%  Weight:       Weight change:   Intake/Output Summary (Last 24 hours) at 03/03/2018 1001 Last data filed at 03/02/2018 1700 Gross per 24 hour  Intake 238 ml  Output 0 ml  Net 238 ml       Labs: Basic Metabolic Panel: Recent Labs  Lab 02/27/18 1328 02/28/18 0800  03/01/18 1050 03/01/18 1328 03/02/18 0923  NA 140 137  --  136 135 135  K 4.1 4.1  --  4.1 3.9 4.0  CL 97* 97*  --  94* 96* 94*  CO2 31 27  --  GLUCOSE 96 94   < > 92 91 89  BUN 21* 28*  --  CREATININE 7.53* 8.45*  --  6.05* 6.13* 4.90*  CALCIUM 8.5* 8.8*  --  8.6* 8.7* 8.9  PHOS 6.1* 7.2*  --   --  4.7*  --    < > = values in this interval not displayed.   Liver  Function Tests: Recent Labs  Lab 02/27/18 1328 02/28/18 0800 03/01/18 1328  ALBUMIN 3.0* 3.0* 3.1*   No results for input(s): LIPASE, AMYLASE in the last 168 hours. Recent Labs  Lab 02/26/18 0719  AMMONIA 17   CBC: Recent Labs  Lab 02/26/18 0759 02/27/18 1328 02/28/18 0809 03/01/18 1328 03/02/18 0923  WBC 5.2 7.2 6.2 5.7 6.1  NEUTROABS  --   --  4.4  --   --   HGB 9.8* 10.5* 10.1* 10.4* 10.4*  HCT 31.2* 33.1* 31.6* 32.5* 32.0*  MCV 101.6* 102.2* 101.3* 101.2* 99.1  PLT 98* 121* 125* 137* 147*   Cardiac Enzymes: No results for input(s): CKTOTAL, CKMB, CKMBINDEX, TROPONINI in the last 168 hours. CBG: Recent Labs  Lab 03/02/18 2036 03/03/18 0016 03/03/18 0450 03/03/18 0537 03/03/18 0800  GLUCAP 79 78 69 123* 92    Iron Studies: No results for input(s): IRON, TIBC, TRANSFERRIN, FERRITIN in the last 72 hours. Studies/Results: Dg Chest Port 1 View  Result Date: 03/02/2018 CLINICAL DATA:  shortness of breath EXAM: PORTABLE CHEST - 1 VIEW COMPARISON:  02/23/2018 FINDINGS: Perihilar vascular congestion and interstitial opacities left greater than right as before. Stable  cardiomegaly.  Aortic Atherosclerosis (ICD10-170.0) No definite effusion.  No pneumothorax. Visualized bones unremarkable. IMPRESSION: Stable cardiomegaly and perihilar infiltrates or edema left greater than right. Electronically Signed   By: Corlis Leak M.D.   On: 03/02/2018 11:45   Dg Swallowing Func-speech Pathology  Result Date: 03/01/2018 Objective Swallowing Evaluation: Type of Study: MBS-Modified Barium Swallow Study  Patient Details Name: Shane [name] MRN: [id number] Date of Birth: 03-23-1941 Today's Date: 03/01/2018 Time: SLP Start Time (ACUTE ONLY): 1016 -SLP Stop Time (ACUTE ONLY): 1036 SLP Time Calculation (min) (ACUTE ONLY): 20 min Past Medical History: Past Medical History: Diagnosis Date . 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 Arthritis    bursitis in hip . Bilateral inguinal hernia (BIH)   right > left side . 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 Reynolds Bowl . Elevated troponin 04/12/2015 . Full dentures  . Hypertension  . Tobacco abuse  . Urinary tract infection due to Enterococcus 08/03/2015 . Varicocele  Past Surgical History: Past Surgical History: Procedure Laterality Date . AV FISTULA PLACEMENT, BRACHIOCEPHALIC  10/19/2010  LEFT . AV FISTULA PLACEMENT, RADIOCEPHALIC  01/30/2011  Right w/ ligation of competing venous branch by Dr. Leonides Sake . AV FISTULA PLACEMENT, RADIOCEPHALIC  09/13/2010  LEFT . BASCILIC VEIN TRANSPOSITION Left 04/08/2015  Procedure: BASILIC VEIN TRANSPOSITION;  Surgeon: Pryor Ochoa, MD;  Location: [facility] OR;  Service: Vascular;  Laterality: Left; . COLONOSCOPY   . INGUINAL HERNIA REPAIR Bilateral 11/25/2015  Procedure: LAPAROSCOPIC BILATERAL INGUINAL HERNIA REPAIR--EXCISION OF RIGHT HYDROCELE;  Surgeon: Karie Soda, MD;  Location: [facility] OR;  Service: General;  Laterality: Bilateral; . INSERTION OF MESH Bilateral 11/25/2015  Procedure: INSERTION OF MESH;  Surgeon: Karie Soda, MD;  Location: [facility] OR;  Service: General;  Laterality: Bilateral; . MULTIPLE TOOTH EXTRACTIONS   HPI: The patient is a 77 y.o. year-old with hx AL amyloid, ESRD on HD, atrial fib who presents to ED with sob and resp distress. Per chart missed Wed and Fri Dialysis treatments last week and had been leaving early in treatments prior to that. Found to be grossly volume overloaded and hyperkalemic. CXR Small left pleural effusion noted. Mild left basilar airspace opacity may reflect atelectasis or possibly mild asymmetric interstitial edema.`  No data recorded Assessment / Plan / Recommendation CHL IP CLINICAL IMPRESSIONS 03/01/2018 Clinical Impression Mr Poyer demonstrated oropharyngeal phase swallow primarily due to decreased sensation and cognitive deficits impacting swallow function resulting aspiration of thin liquids.  Reduced oral cohesion with premature spill and lingual pumping to move to posterior pharynx. Swallows were initiated at the pyriform sinuses and straw sip thin resulted in aspiration as pt speaking with barium in valleculae and pyriform sinuses with cough as well as aspiration with cup. Aspiration event initiated a coughing episode which lasted for 5-7 minutes (coughing every 15 seconds). Pt continued to bring barium into oral cavity after coughs when mouth and pharynx thought to be previously mostly cleared (no significant residue). Esophageal residue suspected, scanned esophagus revealing what appeared to be mild residue at distal esophagus. Uncertain if originated from esophagus and clear by the time esophagus scanned or mucous from lungs mixed with barium residue in oral cavity. Recommend continue Dys 3 texture, nectar thick liquids, pills whole in applesauce, po's only when alert. ST will continue intervention.  SLP Visit Diagnosis Dysphagia, oropharyngeal phase (R13.12) Attention and concentration deficit following -- Frontal lobe and executive function deficit following -- Impact  on safety and function Moderate aspiration risk;Severe aspiration risk   CHL IP TREATMENT RECOMMENDATION 03/01/2018 Treatment Recommendations Therapy as outlined in treatment plan below   Prognosis 03/01/2018 Prognosis for Safe Diet Advancement Good Barriers to Reach Goals Cognitive deficits Barriers/Prognosis Comment -- CHL IP DIET RECOMMENDATION 03/01/2018 SLP Diet Recommendations Nectar thick liquid;Dysphagia 3 (Mech soft) solids Liquid Administration via Cup;No straw Medication Administration Whole meds with puree Compensations Slow rate;Small sips/bites;Minimize environmental distractions;Lingual sweep for clearance of pocketing Postural Changes Seated upright at 90 degrees;Remain semi-upright after after feeds/meals (Comment)   CHL IP OTHER RECOMMENDATIONS 03/01/2018 Recommended Consults -- Oral Care Recommendations Oral care BID  Other Recommendations --   CHL IP FOLLOW UP RECOMMENDATIONS 03/01/2018 Follow up Recommendations Other (comment)   CHL IP FREQUENCY AND DURATION 03/01/2018 Speech Therapy Frequency (ACUTE ONLY) min 2x/week Treatment Duration 2 weeks      CHL IP ORAL PHASE 03/01/2018 Oral Phase Impaired Oral - Pudding Teaspoon -- Oral - Pudding Cup -- Oral - Honey Teaspoon -- Oral - Honey Cup -- Oral - Nectar Teaspoon -- Oral - Nectar Cup Delayed oral transit;Decreased bolus cohesion Oral - Nectar Straw -- Oral - Thin Teaspoon -- Oral - Thin Cup Premature spillage;Decreased bolus cohesion;Lingual pumping Oral - Thin Straw Decreased bolus cohesion;Premature spillage Oral - Puree -- Oral - Mech Soft -- Oral - Regular Other (Comment) Oral - Multi-Consistency -- Oral - Pill -- Oral Phase - Comment --  CHL IP PHARYNGEAL PHASE 03/01/2018 Pharyngeal Phase Impaired Pharyngeal- Pudding Teaspoon -- Pharyngeal -- Pharyngeal- Pudding Cup -- Pharyngeal -- Pharyngeal- Honey Teaspoon -- Pharyngeal -- Pharyngeal- Honey Cup -- Pharyngeal -- Pharyngeal- Nectar Teaspoon -- Pharyngeal -- Pharyngeal- Nectar Cup Delayed swallow initiation-pyriform sinuses Pharyngeal -- Pharyngeal- Nectar Straw -- Pharyngeal -- Pharyngeal- Thin Teaspoon -- Pharyngeal -- Pharyngeal- Thin Cup Delayed swallow initiation-pyriform sinuses;Penetration/Aspiration during swallow Pharyngeal Material enters airway, remains ABOVE vocal cords and not ejected out Pharyngeal- Thin Straw Penetration/Aspiration during swallow;Delayed swallow initiation-pyriform sinuses Pharyngeal Material enters airway, passes BELOW cords and not ejected out despite cough attempt by patient Pharyngeal- Puree -- Pharyngeal -- Pharyngeal- Mechanical Soft -- Pharyngeal -- Pharyngeal- Regular (No Data) Pharyngeal -- Pharyngeal- Multi-consistency -- Pharyngeal -- Pharyngeal- Pill -- Pharyngeal -- Pharyngeal Comment --  CHL IP CERVICAL ESOPHAGEAL PHASE 03/01/2018 Cervical Esophageal Phase WFL Pudding Teaspoon --  Pudding Cup -- Honey Teaspoon -- Honey Cup -- Nectar Teaspoon -- Nectar Cup -- Nectar Straw -- Thin Teaspoon -- Thin Cup -- Thin Straw -- Puree -- Mechanical Soft -- Regular -- Multi-consistency -- Pill -- Cervical Esophageal Comment -- No flowsheet data found. Royce Macadamia 03/01/2018, 12:15 PM Breck Coons Lonell Face.Ed CCC-SLP Pager [phone]               Medications: Infusions: . dextrose 40 mL/hr at 03/02/18 1600    Scheduled Medications: . ammonium lactate   Topical Daily  . calcitRIOL  1 mcg Oral Q M,W,F-HD  . calcium acetate  2,001 mg Oral TID WC  . Chlorhexidine Gluconate Cloth  6 each Topical Q0600  . ferric citrate  630 mg Oral TID WC  . heparin  5,000 Units Subcutaneous Q8H  . LORazepam  1 mg Intravenous Once  . metoprolol succinate  12.5 mg Oral Daily  . nicotine  21 mg Transdermal Daily  . pantoprazole  40 mg Oral Daily  . triamcinolone cream   Topical Daily    have reviewed scheduled and prn medications.  Physical Exam: General: Lying in bed, not in distress Heart: Regular  rate rhythm S1-S2 normal Lungs: Clear bilateral, no wheezing or crackle Abdomen:soft, Non-tender, non-distended Extremities:No edema Dialysis Access: Right upper extremity AV fistula has good thrill and bruit  Arali Somera Prasad Shawnia Vizcarrondo 03/03/2018,10:01 AM  LOS: 8 days

## 2018-03-03 NOTE — Progress Notes (Addendum)
Pharmacy Antibiotic Note  Shane [name] is a 77 y.o. male admitted on 02/23/2018 with pneumonia. Patient is ESRD w/ HD MWF. Febrile and CXR shows infiltrates.   MD requesting to change abx.  Plan: -Change zosyn 3.375 g IV q12h -Vancomycin  500 mg IV qHD -Monitor ESRD schedule, cultures, VR as needed  Weight: 132 lb 0.9 oz (59.9 kg)  Temp (24hrs), Avg:100.1 F (37.8 C), Min:97.9 F (36.6 C), Max:101.3 F (38.5 C)  Recent Labs  Lab 02/26/18 0759  02/27/18 1328 02/28/18 0800 02/28/18 0809 03/01/18 1050 03/01/18 1328 03/02/18 0923  WBC 5.2  --  7.2  --  6.2  --  5.7 6.1  CREATININE  --    < > 7.53* 8.45*  --  6.05* 6.13* 4.90*   < > = values in this interval not displayed.   Shane [name] 03/03/2018 4:25 PM

## 2018-03-04 LAB — BLOOD CULTURE ID PANEL (REFLEXED)
Acinetobacter baumannii: NOT DETECTED
CANDIDA GLABRATA: NOT DETECTED
CANDIDA KRUSEI: NOT DETECTED
Candida albicans: NOT DETECTED
Candida parapsilosis: NOT DETECTED
Candida tropicalis: NOT DETECTED
ENTEROBACTER CLOACAE COMPLEX: NOT DETECTED
ENTEROBACTERIACEAE SPECIES: NOT DETECTED
ENTEROCOCCUS SPECIES: NOT DETECTED
ESCHERICHIA COLI: NOT DETECTED
Haemophilus influenzae: NOT DETECTED
Klebsiella oxytoca: NOT DETECTED
Klebsiella pneumoniae: NOT DETECTED
LISTERIA MONOCYTOGENES: NOT DETECTED
NEISSERIA MENINGITIDIS: NOT DETECTED
PSEUDOMONAS AERUGINOSA: NOT DETECTED
Proteus species: NOT DETECTED
STAPHYLOCOCCUS AUREUS BCID: NOT DETECTED
STAPHYLOCOCCUS SPECIES: NOT DETECTED
STREPTOCOCCUS PNEUMONIAE: NOT DETECTED
STREPTOCOCCUS PYOGENES: NOT DETECTED
Serratia marcescens: NOT DETECTED
Streptococcus agalactiae: NOT DETECTED
Streptococcus species: DETECTED — AB

## 2018-03-04 LAB — GLUCOSE, CAPILLARY
GLUCOSE-CAPILLARY: 79 mg/dL (ref 65–99)
GLUCOSE-CAPILLARY: 81 mg/dL (ref 65–99)
GLUCOSE-CAPILLARY: 87 mg/dL (ref 65–99)
Glucose-Capillary: 114 mg/dL — ABNORMAL HIGH (ref 65–99)
Glucose-Capillary: 115 mg/dL — ABNORMAL HIGH (ref 65–99)
Glucose-Capillary: 86 mg/dL (ref 65–99)

## 2018-03-04 LAB — RENAL FUNCTION PANEL
ANION GAP: 11 (ref 5–15)
Albumin: 2.8 g/dL — ABNORMAL LOW (ref 3.5–5.0)
BUN: 30 mg/dL — ABNORMAL HIGH (ref 6–20)
CALCIUM: 9.5 mg/dL (ref 8.9–10.3)
CO2: 29 mmol/L (ref 22–32)
CREATININE: 8.54 mg/dL — AB (ref 0.61–1.24)
Chloride: 92 mmol/L — ABNORMAL LOW (ref 101–111)
GFR, EST AFRICAN AMERICAN: 6 mL/min — AB (ref >60)
GFR, EST NON AFRICAN AMERICAN: 5 mL/min — AB (ref >60)
Glucose, Bld: 94 mg/dL (ref 65–99)
PHOSPHORUS: 3.4 mg/dL (ref 2.5–4.6)
Potassium: 5.1 mmol/L (ref 3.5–5.1)
SODIUM: 132 mmol/L — AB (ref 135–145)

## 2018-03-04 LAB — CBC
HCT: 29.5 % — ABNORMAL LOW (ref 39.0–52.0)
HEMOGLOBIN: 9.5 g/dL — AB (ref 13.0–17.0)
MCH: 32.2 pg (ref 26.0–34.0)
MCHC: 32.2 g/dL (ref 30.0–36.0)
MCV: 100 fL (ref 78.0–100.0)
PLATELETS: 133 10*3/uL — AB (ref 150–400)
RBC: 2.95 MIL/uL — ABNORMAL LOW (ref 4.22–5.81)
RDW: 13.9 % (ref 11.5–15.5)
WBC: 7.2 10*3/uL (ref 4.0–10.5)

## 2018-03-04 MED ORDER — LIDOCAINE HCL (PF) 1 % IJ SOLN: 5.0000 mL | INTRAMUSCULAR | Status: DC | PRN

## 2018-03-04 MED ORDER — VANCOMYCIN HCL IN DEXTROSE 500-5 MG/100ML-% IV SOLN
INTRAVENOUS | Status: AC
  Administered 2018-03-04: 500 mg
  Filled 2018-03-04: qty 100

## 2018-03-04 MED ORDER — HEPARIN SODIUM (PORCINE) 1000 UNIT/ML DIALYSIS: 1000.0000 [IU] | INTRAMUSCULAR | Status: DC | PRN

## 2018-03-04 MED ORDER — ALTEPLASE 2 MG IJ SOLR: 2.0000 mg | Freq: Once | INTRAMUSCULAR | Status: DC | PRN

## 2018-03-04 MED ORDER — PENTAFLUOROPROP-TETRAFLUOROETH EX AERO: 1.0000 "application " | INHALATION_SPRAY | CUTANEOUS | Status: DC | PRN

## 2018-03-04 MED ORDER — SODIUM CHLORIDE 0.9 % IV SOLN: 100.0000 mL | INTRAVENOUS | Status: DC | PRN

## 2018-03-04 MED ORDER — LIDOCAINE-PRILOCAINE 2.5-2.5 % EX CREA: 1.0000 "application " | TOPICAL_CREAM | CUTANEOUS | Status: DC | PRN

## 2018-03-04 NOTE — Progress Notes (Addendum)
Patient back from dialysis. V/S stable. Assessment done. Will continue to monitor.

## 2018-03-04 NOTE — Progress Notes (Signed)
PROGRESS NOTE    Shane [name]  ZOX:[id number] DOB: 04/10/1941 DOA: 02/23/2018 PCP: [name], MD    Brief Narrative:77 y.o.malewith a history of ESRD 2/2 amyloid nephropathy, HTN, afib, medication noncompliance, and dementiawho presented w/ respiratory distress after having missed his last 2 HD appointments.  In the ED he was found to be grossly volume overloaded and hyperkalemic. He was admitted and underwent HD treatments.     Assessment & Plan:   Active Problems:   ESRD needing dialysis (HCC)   Goals of care, counseling/discussion   Acute respiratory failure with hypoxia (HCC)   SOB (shortness of breath)   Hypoxia   Acute delirium   Dementia with behavioral disturbance   Palliative care by specialist   ESRD with non compliance to HD: Came in with fluid overload, secondary to missing HD sessions.   Nephrology on board and assisting with management.    Moderate dementia with behavioral abnormalities: Pt continues to be intermittently somnolent, lethargic with periods of agitation and restlessness. He has minimal po intake with multiple episodes of hypoglycemia requiring the use of dextrose fluids and pushes of dextrose 50%. Discussed with the patient's family and they are aware of the situation.  Palliative care consulted and recommendations given.    Hypertension:  Well controlled.   Hypoglycemia:  Persistent, multiple times. Very decreased po intake all day for the last 4 days. Started him on dextrose fluids, but concerned about fluid overload and pulm edema.  Discussed with the patient's daughter .   Chronic atrial fibrillation Rate controlled.  Not a candidate for anti coagulation in view of the non compliance.    Dysphagia of unclear etiology.  ordered MRI brain for further evaluation.  SLP evaluation recommending dysphagia 3 diet with nectar thick liquids .  MBS done recommending dysphagia 3 diet. But pt is either lethargic or confused to eat.     Hyperkalemia resolved.    Acute respiratory failure with hypoxia sec to pulm edema , improving, transtioned off oxygen.   Anemia of chronic disease:  Hemoglobin stable at 10.   COPD  No wheezing heard.   Fever today, and CXR showed left lower lobe pneumonia.  Blood cultures ordered and pending.  Started the patient on vancomycin and cefepime.     DVT prophylaxis: heparin. Code Status: full code.  Family Communication: none at bedside today Disposition Plan: SNF on discharge.    Consultants:   Nephrology  slp evaluation.   Palliative care.     Procedures: HDMWF  Antimicrobials: vancomycin and cefepime.   Subjective:  remains confused. No chest pain. No sob.    Objective: Vitals:   03/03/18 1149 03/03/18 2022 03/04/18 0446 03/04/18 0740  BP: 117/77 (!) 102/59 97/77 (!) 116/57  Pulse: 91  86 86  Resp: Temp: 97.9 F (36.6 C) 98 F (36.7 C) 99 F (37.2 C) 98.1 F (36.7 C)  TempSrc: Oral Oral Axillary Axillary  SpO2: 93% 95% 94% 96%  Weight:   63.2 kg (139 lb 5.3 oz) 62.8 kg (138 lb 7.2 oz)   No intake or output data in the 24 hours ending 03/04/18 0828 Filed Weights   03/01/18 1617 03/04/18 0446 03/04/18 0740  Weight: 59.9 kg (132 lb 0.9 oz) 63.2 kg (139 lb 5.3 oz) 62.8 kg (138 lb 7.2 oz)    Examination:  General exam: chronic ill appearing, remains confused.  Respiratory system: diminished at bases. No wheezing or rhonchi.  Cardiovascular system: S1 & S2  heard, RRR. No JVD,  No pedal edema. Gastrointestinal system: Abdomen is soft NT ND BS+ Central nervous system: confused, non focal.   Extremities: no pedal edema, or cyanosis.  Skin: No rashes, lesions or ulcers Psychiatry:  No agitation today.     Data Reviewed: I have personally reviewed following labs and imaging studies  CBC: Recent Labs  Lab 02/27/18 1328 02/28/18 0809 03/01/18 1328 03/02/18 0923 03/04/18 0805  WBC 7.2 6.2 5.7 6.1 PENDING  NEUTROABS  --  4.4   --   --   --   HGB 10.5* 10.1* 10.4* 10.4* 9.5*  HCT 33.1* 31.6* 32.5* 32.0* 29.5*  MCV 102.2* 101.3* 101.2* 99.1 100.0  PLT 121* 125* 137* 147* PENDING   Basic Metabolic Panel: Recent Labs  Lab 02/26/18 0800 02/27/18 1328 02/28/18 0800 02/28/18 1348 03/01/18 1050 03/01/18 1328 03/02/18 0923  NA 138 140 137  --  136 135 135  K 4.4 4.1 4.1  --  4.1 3.9 4.0  CL 95* 97* 97*  --  94* 96* 94*  CO2 --  GLUCOSE 97 96 94 89 92 91 89  BUN 37* 21* 28*  --  CREATININE 10.41* 7.53* 8.45*  --  6.05* 6.13* 4.90*  CALCIUM 8.4* 8.5* 8.8*  --  8.6* 8.7* 8.9  PHOS 8.2* 6.1* 7.2*  --   --  4.7*  --    GFR: Estimated Creatinine Clearance: 11.2 mL/min (A) (by C-G formula based on SCr of 4.9 mg/dL (H)). Liver Function Tests: Recent Labs  Lab 02/26/18 0800 02/27/18 1328 02/28/18 0800 03/01/18 1328  ALBUMIN 3.1* 3.0* 3.0* 3.1*   No results for input(s): LIPASE, AMYLASE in the last 168 hours. Recent Labs  Lab 02/26/18 0719  AMMONIA 17   Coagulation Profile: No results for input(s): INR, PROTIME in the last 168 hours. Cardiac Enzymes: No results for input(s): CKTOTAL, CKMB, CKMBINDEX, TROPONINI in the last 168 hours. BNP (last 3 results) No results for input(s): PROBNP in the last 8760 hours. HbA1C: Recent Labs    03/01/18 1328  HGBA1C 4.7*   CBG: Recent Labs  Lab 03/03/18 1149 03/03/18 1633 03/03/18 2038 03/03/18 2358 03/04/18 0443  GLUCAP 125* 112* 117* 114* 86   Lipid Profile: No results for input(s): CHOL, HDL, LDLCALC, TRIG, CHOLHDL, LDLDIRECT in the last 72 hours. Thyroid Function Tests: No results for input(s): TSH, T4TOTAL, FREET4, T3FREE, THYROIDAB in the last 72 hours. Anemia Panel: No results for input(s): VITAMINB12, FOLATE, FERRITIN, TIBC, IRON, RETICCTPCT in the last 72 hours. Sepsis Labs: No results for input(s): PROCALCITON, LATICACIDVEN in the last 168 hours.  Recent Results (from the past 240 hour(s))  MRSA PCR Screening      Status: None   Collection Time: 02/24/18  6:16 AM  Result Value Ref Range Status   MRSA by PCR NEGATIVE NEGATIVE Final    Comment:        The GeneXpert MRSA Assay (FDA approved for NASAL specimens only), is one component of a comprehensive MRSA colonization surveillance program. It is not intended to diagnose MRSA infection nor to guide or monitor treatment for MRSA infections. Performed at [facility], [address] [place], [place] [address]          Radiology Studies: Dg Chest Port 1 View  Result Date: 03/03/2018 CLINICAL DATA:  Aspiration pneumonia.  Fever. EXAM: PORTABLE CHEST 1 VIEW COMPARISON:  03/02/2018 FINDINGS: Left lower lobe consolidation. Right lung is  clear. No pneumothorax. No pleural effusion. Mild cardiomegaly. Vascular congestion without interstitial edema. Nodular density at the right lung base may represent a nipple shadow. IMPRESSION: Left lower lobe pneumonia. Cardiomegaly and vascular congestion. Nodular density at the right lung base. Attention on follow-up radiograph is recommended. Electronically Signed   By: Jolaine Click M.D.   On: 03/03/2018 11:04   Dg Chest Port 1 View  Result Date: 03/02/2018 CLINICAL DATA:  shortness of breath EXAM: PORTABLE CHEST - 1 VIEW COMPARISON:  02/23/2018 FINDINGS: Perihilar vascular congestion and interstitial opacities left greater than right as before. Stable cardiomegaly.  Aortic Atherosclerosis (ICD10-170.0) No definite effusion.  No pneumothorax. Visualized bones unremarkable. IMPRESSION: Stable cardiomegaly and perihilar infiltrates or edema left greater than right. Electronically Signed   By: Corlis Leak M.D.   On: 03/02/2018 11:45        Scheduled Meds: . ammonium lactate   Topical Daily  . calcitRIOL  1 mcg Oral Q M,W,F-HD  . calcium acetate  2,001 mg Oral TID WC  . Chlorhexidine Gluconate Cloth  6 each Topical Q0600  . ferric citrate  630 mg Oral TID WC  . heparin  5,000 Units Subcutaneous Q8H  .  LORazepam  1 mg Intravenous Once  . metoprolol succinate  12.5 mg Oral Daily  . nicotine  21 mg Transdermal Daily  . pantoprazole  40 mg Oral Daily  . triamcinolone cream   Topical Daily   Continuous Infusions: . sodium chloride    . sodium chloride    . dextrose 40 mL/hr at 03/04/18 0234  . piperacillin-tazobactam (ZOSYN)  IV    . vancomycin       LOS: 9 days    Time spent: 35 minutes    Kathlen Mody, MD [facility] Pager [phone] If 7PM-7AM, please contact night-coverage [email or web address] Password [facility] 03/04/2018, 8:28 AM

## 2018-03-04 NOTE — Progress Notes (Signed)
[facility] Progress Note  Subjective: pt confused in restraints  Vitals:   03/04/18 0930 03/04/18 1000 03/04/18 1030 03/04/18 1100  BP: 112/60 (!) 122/38 (!) 94/33 115/60  Pulse: 68 81 77 86  Resp:      Temp:      TempSrc:      SpO2:      Weight:        Inpatient medications: . ammonium lactate   Topical Daily  . calcitRIOL  1 mcg Oral Q M,W,F-HD  . calcium acetate  2,001 mg Oral TID WC  . Chlorhexidine Gluconate Cloth  6 each Topical Q0600  . ferric citrate  630 mg Oral TID WC  . heparin  5,000 Units Subcutaneous Q8H  . LORazepam  1 mg Intravenous Once  . metoprolol succinate  12.5 mg Oral Daily  . nicotine  21 mg Transdermal Daily  . pantoprazole  40 mg Oral Daily  . triamcinolone cream   Topical Daily   . sodium chloride    . sodium chloride    . dextrose 40 mL/hr at 03/04/18 0234  . piperacillin-tazobactam (ZOSYN)  IV    . vancomycin     sodium chloride, sodium chloride, acetaminophen **OR** acetaminophen, albuterol, alteplase, bisacodyl, dextrose, diphenhydrAMINE, haloperidol lactate, heparin, lidocaine (PF), lidocaine-prilocaine, LORazepam, ondansetron **OR** ondansetron (ZOFRAN) IV, oxyCODONE, pentafluoroprop-tetrafluoroeth, senna-docusate  Exam:  on hd not in distress confused   no jvd  chest cta bilat  cor reg no mrg  abd soft ntnd scaphoid  ext no sig leg edema  left ua avf +bruit  confused  cxr 5/26 - retrocardiac density on left  Dialysis: mwf   4h  65kg  2/2.25 bath left arm avf  Heparin none      Impression: 1 altered mental status - remains confused likely underlying dementia, agitation being treated with prn iv haldol has gotten 3 doses in last 36hrs 2 esrd on dialysis mwf hx of amyloid nephropathy 3 volume is stable under dry wt 4 anemia ckd hb 10's here 5 hypertension on metoprolol xl only 6 fevers - in hospital poss pna on iv vanc/ unasyn  Plan - dialysis today min uf   Vinson Moselle MD [facility] pager  [phone]   03/04/2018, 11:22 AM   Recent Labs  Lab 02/28/18 0800  03/01/18 1328 03/02/18 0923 03/04/18 0804  NA 137   < > 135 135 132*  K 4.1   < > 3.9 4.0 5.1  CL 97*   < > 96* 94* 92*  CO2 27   < > GLUCOSE 94   < > 91 89 94  BUN 28*   < > 14 9 30*  CREATININE 8.45*   < > 6.13* 4.90* 8.54*  CALCIUM 8.8*   < > 8.7* 8.9 9.5  PHOS 7.2*  --  4.7*  --  3.4   < > = values in this interval not displayed.   Recent Labs  Lab 02/28/18 0800 03/01/18 1328 03/04/18 0804  ALBUMIN 3.0* 3.1* 2.8*   Recent Labs  Lab 02/28/18 0809 03/01/18 1328 03/02/18 0923 03/04/18 0805  WBC 6.2 5.7 6.1 7.2  NEUTROABS 4.4  --   --   --   HGB 10.1* 10.4* 10.4* 9.5*  HCT 31.6* 32.5* 32.0* 29.5*  MCV 101.3* 101.2* 99.1 100.0  PLT 125* 137* 147* 133*   Iron/TIBC/Ferritin/ %Sat No results found for: IRON, TIBC, FERRITIN, IRONPCTSAT

## 2018-03-04 NOTE — Progress Notes (Signed)
Pt left for dialysis at this time.

## 2018-03-04 NOTE — Progress Notes (Signed)
PHARMACY - PHYSICIAN COMMUNICATION CRITICAL VALUE ALERT - BLOOD CULTURE IDENTIFICATION (BCID)  Results for orders placed or performed during the hospital encounter of 02/23/18  Blood Culture ID Panel (Reflexed) (Collected: 03/03/2018 10:29 AM)  Result Value Ref Range   Enterococcus species NOT DETECTED NOT DETECTED   Listeria monocytogenes NOT DETECTED NOT DETECTED   Staphylococcus species NOT DETECTED NOT DETECTED   Staphylococcus aureus NOT DETECTED NOT DETECTED   Streptococcus species DETECTED (A) NOT DETECTED   Streptococcus agalactiae NOT DETECTED NOT DETECTED   Streptococcus pneumoniae NOT DETECTED NOT DETECTED   Streptococcus pyogenes NOT DETECTED NOT DETECTED   Acinetobacter baumannii NOT DETECTED NOT DETECTED   Enterobacteriaceae species NOT DETECTED NOT DETECTED   Enterobacter cloacae complex NOT DETECTED NOT DETECTED   Escherichia coli NOT DETECTED NOT DETECTED   Klebsiella oxytoca NOT DETECTED NOT DETECTED   Klebsiella pneumoniae NOT DETECTED NOT DETECTED   Proteus species NOT DETECTED NOT DETECTED   Serratia marcescens NOT DETECTED NOT DETECTED   Haemophilus influenzae NOT DETECTED NOT DETECTED   Neisseria meningitidis NOT DETECTED NOT DETECTED   Pseudomonas aeruginosa NOT DETECTED NOT DETECTED   Candida albicans NOT DETECTED NOT DETECTED   Candida glabrata NOT DETECTED NOT DETECTED   Candida krusei NOT DETECTED NOT DETECTED   Candida parapsilosis NOT DETECTED NOT DETECTED   Candida tropicalis NOT DETECTED NOT DETECTED    Name of physician (or Provider) ContactedBlake Divine  Changes to prescribed antibiotics required: None already on Vanco/Zosyn   Valaree Fresquez S. Merilynn Finland, PharmD, BCPS Clinical Staff Pharmacist  Misty Stanley Stillinger 03/04/2018  2:32 PM

## 2018-03-05 ENCOUNTER — Inpatient Hospital Stay (HOSPITAL_COMMUNITY): Payer: Medicare Other

## 2018-03-05 DIAGNOSIS — J189 Pneumonia, unspecified organism: Secondary | ICD-10-CM | POA: Diagnosis not present

## 2018-03-05 DIAGNOSIS — R7881 Bacteremia: Secondary | ICD-10-CM

## 2018-03-05 DIAGNOSIS — F0151 Vascular dementia with behavioral disturbance: Secondary | ICD-10-CM

## 2018-03-05 DIAGNOSIS — Z7189 Other specified counseling: Secondary | ICD-10-CM

## 2018-03-05 DIAGNOSIS — B955 Unspecified streptococcus as the cause of diseases classified elsewhere: Secondary | ICD-10-CM

## 2018-03-05 DIAGNOSIS — Z992 Dependence on renal dialysis: Secondary | ICD-10-CM

## 2018-03-05 DIAGNOSIS — Z515 Encounter for palliative care: Secondary | ICD-10-CM

## 2018-03-05 DIAGNOSIS — E162 Hypoglycemia, unspecified: Secondary | ICD-10-CM

## 2018-03-05 DIAGNOSIS — N186 End stage renal disease: Secondary | ICD-10-CM

## 2018-03-05 LAB — GLUCOSE, CAPILLARY
GLUCOSE-CAPILLARY: 35 mg/dL — AB (ref 65–99)
GLUCOSE-CAPILLARY: 74 mg/dL (ref 65–99)
GLUCOSE-CAPILLARY: 85 mg/dL (ref 65–99)
GLUCOSE-CAPILLARY: 85 mg/dL (ref 65–99)
GLUCOSE-CAPILLARY: 86 mg/dL (ref 65–99)
Glucose-Capillary: 83 mg/dL (ref 65–99)

## 2018-03-05 MED ORDER — CHLORHEXIDINE GLUCONATE CLOTH 2 % EX PADS
6.0000 | MEDICATED_PAD | Freq: Every day | CUTANEOUS | Status: DC
  Administered 2018-03-05 – 2018-03-07 (×3): 6 via TOPICAL

## 2018-03-05 NOTE — Progress Notes (Signed)
[facility] Progress Note  Subjective: pt confused in restraints  Vitals:   03/04/18 1644 03/04/18 2045 03/05/18 0415 03/05/18 0756  BP:  111/71 90/79 113/69  Pulse:    76  Resp: 19 16 (!) 21 17  Temp:  97.7 F (36.5 C) (!) 97.5 F (36.4 C) 97.9 F (36.6 C)  TempSrc:  Oral Oral Oral  SpO2:  92% 94%   Weight:   59.1 kg (130 lb 4.7 oz)     Inpatient medications: . ammonium lactate   Topical Daily  . calcitRIOL  1 mcg Oral Q M,W,F-HD  . calcium acetate  2,001 mg Oral TID WC  . Chlorhexidine Gluconate Cloth  6 each Topical Q0600  . ferric citrate  630 mg Oral TID WC  . heparin  5,000 Units Subcutaneous Q8H  . LORazepam  1 mg Intravenous Once  . metoprolol succinate  12.5 mg Oral Daily  . nicotine  21 mg Transdermal Daily  . pantoprazole  40 mg Oral Daily  . triamcinolone cream   Topical Daily   . dextrose 40 mL/hr at 03/04/18 2257  . piperacillin-tazobactam (ZOSYN)  IV Stopped (03/05/18 1610)  . vancomycin     acetaminophen **OR** acetaminophen, albuterol, bisacodyl, dextrose, diphenhydrAMINE, haloperidol lactate, LORazepam, ondansetron **OR** ondansetron (ZOFRAN) IV, oxyCODONE, senna-docusate  Exam:  on hd not in distress confused   no jvd  chest cta bilat  cor reg no mrg  abd soft ntnd scaphoid  ext no sig leg edema  left ua avf +bruit  confused  cxr 5/26 - retrocardiac density on left  Dialysis: [facility]   4h  65kg  2/2.25 bath left arm avf  Heparin none      Impression: 1 altered mental status - remains confused prob underlying dementia, requiring IV haldol for agitation 2 esrd on dialysis [facility] hx of amyloid nephropathy; poor compliance with op dialysis  3 volume is stable under dry wt 4 anemia ckd hb 10's here 5 hypertension on metoprolol xl only 6 fevers - in hospital +strep in blood, prob pna on iv vanc+ unasyn, could be tapered  Plan - dialysis tomorrow  Vinson Moselle MD [facility] pager [phone]   03/05/2018, 11:17 AM    Recent Labs  Lab 02/28/18 0800  03/01/18 1328 03/02/18 0923 03/04/18 0804  NA 137   < > 135 135 132*  K 4.1   < > 3.9 4.0 5.1  CL 97*   < > 96* 94* 92*  CO2 27   < > GLUCOSE 94   < > 91 89 94  BUN 28*   < > 14 9 30*  CREATININE 8.45*   < > 6.13* 4.90* 8.54*  CALCIUM 8.8*   < > 8.7* 8.9 9.5  PHOS 7.2*  --  4.7*  --  3.4   < > = values in this interval not displayed.   Recent Labs  Lab 02/28/18 0800 03/01/18 1328 03/04/18 0804  ALBUMIN 3.0* 3.1* 2.8*   Recent Labs  Lab 02/28/18 0809 03/01/18 1328 03/02/18 0923 03/04/18 0805  WBC 6.2 5.7 6.1 7.2  NEUTROABS 4.4  --   --   --   HGB 10.1* 10.4* 10.4* 9.5*  HCT 31.6* 32.5* 32.0* 29.5*  MCV 101.3* 101.2* 99.1 100.0  PLT 125* 137* 147* 133*   Iron/TIBC/Ferritin/ %Sat No results found for: IRON, TIBC, FERRITIN, IRONPCTSAT

## 2018-03-05 NOTE — Clinical Social Work Note (Signed)
Clinical Social Work Assessment  Patient Details  Name: Shane [name] MRN: [id number] Date of Birth: Mar 04, 1941  Date of referral:  03/05/18               Reason for consult:  Discharge Planning, Facility Placement                Permission sought to share information with:  Family Supports Permission granted to share information::  Yes, Verbal Permission Granted  Name::     Illene Regulus  Agency::     Relationship::  daughter  Contact Information:  [phone]  Housing/Transportation Living arrangements for the past 2 months:  Single Family Home Source of Information:  Patient Patient Interpreter Needed:  None Criminal Activity/Legal Involvement Pertinent to Current Situation/Hospitalization:  No - Comment as needed Significant Relationships:  Adult Children, Spouse Lives with:  Adult Children, Spouse Do you feel safe going back to the place where you live?  Yes Need for family participation in patient care:  Yes (Comment)  Care giving concerns:  Patients daughter Devonta Blanford was in the room. Lauren stated she is from [place] and was at the hospital for support. CSW unable to assess patient as he is currently only cognitive to self and currently has a telesitter/mittens. Patient lives at home with wife and adult daughter   Facilities manager / plan:  CSW met patients daughter at bedside to discuss some discharge plan. CSW spoke with Ander Purpura about  Physical therapist recommendation for short term rehab. Lauren stated her and her siblings will be at the hospital for a family meeting with the physician. Lauren stated at this time she does not feel like her father would do well at a SNF. Lauren stated she will follow up with CSW once family has made an informed decision as a family. Employment status:  Retired Nurse, adult PT Recommendations:  [place] / Referral to community resources:  [place]  Patient/Family's Response to care:  Lauren wanting to include family in patients care  Patient/Family's Understanding of and Emotional Response to Diagnosis, Current Treatment, and Prognosis:  CSW will continue to follow patient and family for support and discharge plan.  Emotional Assessment Appearance:  Appears older than stated age Attitude/Demeanor/Rapport:  Unable to Assess Affect (typically observed):  Unable to Assess Orientation:  Oriented to Self Alcohol / Substance use:  Not Applicable Psych involvement (Current and /or in the community):  No (Comment)  Discharge Needs  Concerns to be addressed:  No discharge needs identified Readmission within the last 30 days:  No Current discharge risk:  None Barriers to Discharge:  Continued Medical Work up   [employer], LCSW 03/05/2018, 4:45 PM

## 2018-03-05 NOTE — Progress Notes (Signed)
Occupational Therapy Treatment Patient Details Name: Shane [name] MRN: [id number] DOB: 1941/07/20 Today's Date: 03/05/2018    History of present illness The patient is a 77 y.o. year-old with hx AL amyloid, ESRD on HD, atrial fib who presents to ED with sob and resp distress.  Was on bipap briefly in ed but didn't tolerate well. Pt missed WEd and Fri Dialysis treatments last week and had been leaving early in treatments prior to that.     OT comments  Pt received supine in bed and lethargic. Pt maintaining eye closed throughout session and requiring total A for ADLs. Pt give ativan for MRI in AM. Pt fluctuating from requiring Min guard A-Total A for sitting at EOB. Continue to recommend dc to SNF and will continue to follow acutely as admitted.   Follow Up Recommendations  SNF    Equipment Recommendations  (defer to next venue)    Recommendations for Other Services PT consult;Speech consult    Precautions / Restrictions Precautions Precautions: Fall Restrictions Weight Bearing Restrictions: No       Mobility Bed Mobility Overal bed mobility: Needs Assistance Bed Mobility: Supine to Sit;Sit to Supine     Supine to sit: +2 for physical assistance;Total assist Sit to supine: Total assist;+2 for physical assistance   General bed mobility comments: Assist for all aspects.  Transfers                      Balance Overall balance assessment: Needs assistance Sitting-balance support: Bilateral upper extremity supported;Feet supported Sitting balance-Leahy Scale: Poor Sitting balance - Comments: Sat EOB x 12 minutes with mod assist to min guard. Pt with posterior and rt lean. Toward end of sitting pt more alert and sat with min guard. Postural control: Posterior lean;Right lateral lean                                 ADL either performed or assessed with clinical judgement   ADL Overall ADL's : Needs assistance/impaired                                       General ADL Comments: Pt with poor arousal and requiring total A for all ADLs and bed mobility. Pt not following commands and keeping eyes closed throughout session. Pt coughing once upright and noted deep cough and mucus sounds.      Vision       Perception     Praxis      Cognition Arousal/Alertness: Lethargic Behavior During Therapy: Flat affect Overall Cognitive Status: Impaired/Different from baseline Area of Impairment: Problem solving;Safety/judgement;Orientation;Following commands                 Orientation Level: Disoriented to;Place;Time;Situation;Person     Following Commands: (Did not follow any commands) Safety/Judgement: Decreased awareness of safety;Decreased awareness of deficits   Problem Solving: Slow processing;Decreased initiation;Difficulty sequencing;Requires verbal cues;Requires tactile cues General Comments: Pt lethargic. Eyes closed most of session. Pt did arouse more toward end of session.        Exercises     Shoulder Instructions       General Comments Daughter present throughout session    Pertinent Vitals/ Pain       Pain Assessment: Faces Faces Pain Scale: No hurt Pain Intervention(s): Monitored during session  Home Living  Prior Functioning/Environment              Frequency  Min 2X/week        Progress Toward Goals  OT Goals(current goals can now be found in the care plan section)  Progress towards OT goals: Not progressing toward goals - comment  Acute Rehab OT Goals Patient Stated Goal: "Go to the store and get my orange juice." OT Goal Formulation: With patient Time For Goal Achievement: 03/13/18 Potential to Achieve Goals: Good ADL Goals Pt Will Perform Eating: with min assist;sitting(Min cues) Pt Will Perform Grooming: with min assist;sitting(Min cues) Pt Will Perform Upper Body Dressing: with min assist;sitting(Min  cues) Pt Will Perform Lower Body Dressing: with mod assist;sit to/from stand(Min cues) Pt Will Transfer to Toilet: with min assist;ambulating;bedside commode  Plan Discharge plan remains appropriate    Co-evaluation    PT/OT/SLP Co-Evaluation/Treatment: Yes Reason for Co-Treatment: For patient/therapist safety;Complexity of the patient's impairments (multi-system involvement) PT goals addressed during session: Mobility/safety with mobility;Balance OT goals addressed during session: ADL's and self-care      AM-PAC PT "6 Clicks" Daily Activity     Outcome Measure   Help from another person eating meals?: Total Help from another person taking care of personal grooming?: Total Help from another person toileting, which includes using toliet, bedpan, or urinal?: Total Help from another person bathing (including washing, rinsing, drying)?: Total Help from another person to put on and taking off regular upper body clothing?: Total Help from another person to put on and taking off regular lower body clothing?: Total 6 Click Score: 6    End of Session    OT Visit Diagnosis: Unsteadiness on feet (R26.81);Other abnormalities of gait and mobility (R26.89);Muscle weakness (generalized) (M62.81);Other symptoms and signs involving cognitive function   Activity Tolerance Patient limited by lethargy   Patient Left in bed;with call bell/phone within reach;with bed alarm set;with family/visitor present;with restraints reapplied   Nurse Communication Mobility status;Other (comment)(Poor arousal)        Time: 1430-1450 OT Time Calculation (min): 20 min  Charges: OT General Charges $OT Visit: 1 Visit OT Treatments $Self Care/Home Management : 8-22 mins  Dorotha Hirschi MSOT, OTR/L Acute Rehab Pager: [phone] Office: [phone]   Theodoro Grist Clothilde Tippetts 03/05/2018, 5:27 PM

## 2018-03-05 NOTE — Progress Notes (Signed)
PMT received a call from Rudie Meyer (dtr) requesting medical updates and another family meeting.   We discussed the strep bacteremia, as well as the on-going hypoglycemia.     Marcelino Duster requested a family meeting to include all of her "out of town" siblings on Thursday at 1:00 pm.     I will reach out to nephrology to determine if they are able to participate in the meeting as well.   Norvel Richards, PA-C Palliative Medicine Pager: [phone]   Time 15 min.

## 2018-03-05 NOTE — Progress Notes (Signed)
  Speech Language Pathology Treatment: Dysphagia  Patient Details Name: Shane [name] MRN: [id number] DOB: [birth year] Today's Date: 03/05/2018 Time: 4098-1191 SLP Time Calculation (min) (ACUTE ONLY): 12 min  Assessment / Plan / Recommendation Clinical Impression  Pt eating breakfast with RN tech assisting when SLP arrived. Prolonged mastication was consistent throughout observation (SLP did not realize dentures were not in). Delayed and prolonged coughing while masticating eggs. Pt able to clear oral cavity. Tech states pt tolerates smoother textures. Although pt's dentures were not donned during breakfast, SLP downgraded texture to puree, continue nectar, no straws. Educated tech to place dentures prior to lunch (all meals) and remove mitt to allow self feeding with full supervision. Will continue tx.    HPI HPI: The patient is a 77 y.o. year-old with hx AL amyloid, ESRD on HD, atrial fib who presents to ED with sob and resp distress. Per chart missed Wed and Fri Dialysis treatments last week and had been leaving early in treatments prior to that. Found to be grossly volume overloaded and hyperkalemic. CXR Small left pleural effusion noted. Mild left basilar airspace opacity may reflect atelectasis or possibly mild asymmetric interstitial edema.`      SLP Plan  Continue with current plan of care       Recommendations  Diet recommendations: Dysphagia 1 (puree);Nectar-thick liquid Liquids provided via: Cup;No straw Medication Administration: Crushed with puree Supervision: Patient able to self feed;Full supervision/cueing for compensatory strategies Compensations: Slow rate;Small sips/bites;Minimize environmental distractions;Lingual sweep for clearance of pocketing Postural Changes and/or Swallow Maneuvers: Seated upright 90 degrees                Oral Care Recommendations: Oral care BID Follow up Recommendations: Skilled Nursing facility SLP Visit Diagnosis: Dysphagia,  oropharyngeal phase (R13.12) Plan: Continue with current plan of care                       Royce Macadamia 03/05/2018, 9:06 AM  Breck Coons Lonell Face.Ed [employer] [phone]

## 2018-03-05 NOTE — Progress Notes (Signed)
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  In the ED he was found to be grossly volume overloaded and hyperkalemic. He was admitted and underwent HD with improvement in the pulmonary edema. Meanwhile he continues to deteriorate, with cognitive decline, agitation, and decreased po intake , with subsequent hypoglycemic episodes. MRI brain did not show any acute stroke for these new behavioral abnormalities.  He continues to be in restraints. Meanwhile palliative care consulted to see if he can be transitioned to hospice as appropriate, but patient's family would like aggressive management at this time.      Assessment & Plan:   Active Problems:   ESRD needing dialysis (HCC)   Goals of care, counseling/discussion   Acute respiratory failure with hypoxia (HCC)   SOB (shortness of breath)   Hypoxia   Acute delirium   Dementia with behavioral disturbance   Palliative care by specialist   ESRD with non compliance to HD: Came in with fluid overload and pulm edema secondary to missing HD sessions.   Nephrology on board and assisting with management.    Moderate dementia with behavioral abnormalities: Pt continues to be intermittently somnolent, lethargic with periods of agitation and restlessness. He has minimal po intake with multiple episodes of hypoglycemia requiring the use of dextrose fluids and pushes of dextrose 50%. Discussed with the patient's family and they are aware of the situation.  Palliative care consulted and family would like to continue with aggressive management.    Hypertension:  Well controlled.   Hypoglycemia:  Persistent, multiple times. Very decreased po intake all day for the last 4  days. Started him on dextrose fluids, but concerned about fluid overload and pulm edema.  Discussed with the patient's daughter .   Chronic atrial fibrillation Rate controlled.  Not a candidate for anti coagulation in view of the non compliance.    Dysphagia of unclear etiology.  ordered MRI brain for further evaluation, which was negative for acute stroke.  SLP evaluation recommending dysphagia 3 diet with nectar thick liquids .  MBS done recommending dysphagia 3 diet. But pt is either lethargic or confused to eat.    Hyperkalemia resolved.    Acute respiratory failure with hypoxia sec to pulm edema , improving, transtioned off oxygen.   Anemia of chronic disease:  Hemoglobin stable at 10.   COPD  No wheezing heard.   Fever, with left lower lobe pneumonia: treating him for health care associated pneumonia. Blood cultures show streptococcal species Started the patient on vancomycin and zosyn. Await sensitivities.   Mild hyponatremia:  Check labs in am.    DVT prophylaxis: heparin. Code Status: full code.  Family Communication: none at bedside today Disposition Plan: SNF on discharge.    Consultants:   Nephrology  slp evaluation.   Palliative care.     Procedures: HDMWF  Antimicrobials: vancomycin and cefepime.   Subjective: Remains confused, and in restraints.    Objective: Vitals:   03/04/18 2045 03/05/18 0415 03/05/18 0756 03/05/18 1234  BP: 111/71 90/79 113/69 115/90  Pulse:   76 93  Resp: 16 (!) 21 17 (!) 21  Temp: 97.7 F (36.5 C) (!) 97.5 F (36.4 C) 97.9 F (36.6 C) 98.5 F (36.9 C)  TempSrc: Oral Oral Oral Oral  SpO2: 92% 94%  98%  Weight:  59.1 kg (130 lb 4.7 oz)      Intake/Output Summary (Last 24 hours) at 03/05/2018 1248 Last data filed at 03/04/2018 1851 Gross per 24 hour  Intake 650 ml  Output -  Net 650 ml   Filed Weights   03/04/18 0740 03/04/18 1143 03/05/18 0415  Weight: 62.8 kg (138 lb 7.2 oz) 59.8 kg (131 lb 13.4 oz)  59.1 kg (130 lb 4.7 oz)    Examination:  General exam: chronic ill appearing, remains confused.  Respiratory system: diminished at bases, no wheezing heard.  Cardiovascular system: S1 & S2 heard, RRR. No JVD,  No pedal edema. Gastrointestinal system: Abdomen is soft non tender non distended bowel sounds heard.  Central nervous system: confused, non focal.   Extremities: no pedal edema, or cyanosis.  Skin: No rashes, lesions or ulcers Psychiatry:  No agitation today.     Data Reviewed: I have personally reviewed following labs and imaging studies  CBC: Recent Labs  Lab 02/27/18 1328 02/28/18 0809 03/01/18 1328 03/02/18 0923 03/04/18 0805  WBC 7.2 6.2 5.7 6.1 7.2  NEUTROABS  --  4.4  --   --   --   HGB 10.5* 10.1* 10.4* 10.4* 9.5*  HCT 33.1* 31.6* 32.5* 32.0* 29.5*  MCV 102.2* 101.3* 101.2* 99.1 100.0  PLT 121* 125* 137* 147* 133*   Basic Metabolic Panel: Recent Labs  Lab 02/27/18 1328 02/28/18 0800 02/28/18 1348 03/01/18 1050 03/01/18 1328 03/02/18 0923 03/04/18 0804  NA 140 137  --  136 135 135 132*  K 4.1 4.1  --  4.1 3.9 4.0 5.1  CL 97* 97*  --  94* 96* 94* 92*  CO2 31 27  --  GLUCOSE 96 94 89 92 91 89 94  BUN 21* 28*  --  30*  CREATININE 7.53* 8.45*  --  6.05* 6.13* 4.90* 8.54*  CALCIUM 8.5* 8.8*  --  8.6* 8.7* 8.9 9.5  PHOS 6.1* 7.2*  --   --  4.7*  --  3.4   GFR: Estimated Creatinine Clearance: 6.2 mL/min (A) (by C-G formula based on SCr of 8.54 mg/dL (H)). Liver Function Tests: Recent Labs  Lab 02/27/18 1328 02/28/18 0800 03/01/18 1328 03/04/18 0804  ALBUMIN 3.0* 3.0* 3.1* 2.8*   No results for input(s): LIPASE, AMYLASE in the last 168 hours. No results for input(s): AMMONIA in the last 168 hours. Coagulation Profile: No results for input(s): INR, PROTIME in the last 168 hours. Cardiac Enzymes: No results for input(s): CKTOTAL, CKMB, CKMBINDEX, TROPONINI in the last 168 hours. BNP (last 3 results) No results for  input(s): PROBNP in the last 8760 hours. HbA1C: No results for input(s): HGBA1C in the last 72 hours. CBG: Recent Labs  Lab 03/04/18 2045 03/04/18 2350 03/05/18 0414 03/05/18 0751 03/05/18 1219  GLUCAP 115* 81 86 85 85   Lipid Profile: No results for input(s): CHOL, HDL, LDLCALC, TRIG, CHOLHDL, LDLDIRECT in the last 72 hours. Thyroid Function Tests: No results for input(s): TSH, T4TOTAL, FREET4, T3FREE, THYROIDAB in the last 72 hours. Anemia Panel: No results for input(s): VITAMINB12, FOLATE, FERRITIN, TIBC, IRON, RETICCTPCT in the last 72 hours. Sepsis Labs: No results for input(s): PROCALCITON, LATICACIDVEN in the last 168 hours.  Recent Results (from the past 240 hour(s))  MRSA PCR Screening     Status: None   Collection Time: 02/24/18  6:16 AM  Result Value Ref Range Status   MRSA  by PCR NEGATIVE NEGATIVE Final    Comment:        The GeneXpert MRSA Assay (FDA approved for NASAL specimens only), is one component of a comprehensive MRSA colonization surveillance program. It is not intended to diagnose MRSA infection nor to guide or monitor treatment for MRSA infections. Performed at [facility], [address] [place], [place] [address]   Culture, blood (routine x 2)     Status: None (Preliminary result)   Collection Time: 03/03/18 10:29 AM  Result Value Ref Range Status   Specimen Description BLOOD SITE NOT SPECIFIED  Final   Special Requests   Final    BOTTLES DRAWN AEROBIC ONLY Blood Culture adequate volume   Culture  Setup Time   Final    GRAM POSITIVE COCCI IN CHAINS AEROBIC BOTTLE ONLY CRITICAL RESULT CALLED TO, READ BACK BY AND VERIFIED WITH: C ROBERTSON,PHARMD AT 1414 03/04/18 BY L BENFIELD Performed at [facility], [address] [place], [place] [address]    Culture GRAM POSITIVE COCCI  Final   Report Status PENDING  Incomplete  Culture, blood (routine x 2)     Status: None (Preliminary result)   Collection Time: 03/03/18 10:29 AM    Result Value Ref Range Status   Specimen Description BLOOD SITE NOT SPECIFIED  Final   Special Requests   Final    BOTTLES DRAWN AEROBIC ONLY Blood Culture adequate volume   Culture   Final    NO GROWTH 1 DAY Performed at [facility], [address], [place], [place] [address]    Report Status PENDING  Incomplete  Blood Culture ID Panel (Reflexed)     Status: Abnormal   Collection Time: 03/03/18 10:29 AM  Result Value Ref Range Status   Enterococcus species NOT DETECTED NOT DETECTED Final   Listeria monocytogenes NOT DETECTED NOT DETECTED Final   Staphylococcus species NOT DETECTED NOT DETECTED Final   Staphylococcus aureus NOT DETECTED NOT DETECTED Final   Streptococcus species DETECTED (A) NOT DETECTED Final    Comment: Not Enterococcus species, Streptococcus agalactiae, Streptococcus pyogenes, or Streptococcus pneumoniae. CRITICAL RESULT CALLED TO, READ BACK BY AND VERIFIED WITH: C ROBERTSON,PHARMD AT 1414 03/04/18 BY L BENFIELD    Streptococcus agalactiae NOT DETECTED NOT DETECTED Final   Streptococcus pneumoniae NOT DETECTED NOT DETECTED Final   Streptococcus pyogenes NOT DETECTED NOT DETECTED Final   Acinetobacter baumannii NOT DETECTED NOT DETECTED Final   Enterobacteriaceae species NOT DETECTED NOT DETECTED Final   Enterobacter cloacae complex NOT DETECTED NOT DETECTED Final   Escherichia coli NOT DETECTED NOT DETECTED Final   Klebsiella oxytoca NOT DETECTED NOT DETECTED Final   Klebsiella pneumoniae NOT DETECTED NOT DETECTED Final   Proteus species NOT DETECTED NOT DETECTED Final   Serratia marcescens NOT DETECTED NOT DETECTED Final   Haemophilus influenzae NOT DETECTED NOT DETECTED Final   Neisseria meningitidis NOT DETECTED NOT DETECTED Final   Pseudomonas aeruginosa NOT DETECTED NOT DETECTED Final   Candida albicans NOT DETECTED NOT DETECTED Final   Candida glabrata NOT DETECTED NOT DETECTED Final   Candida krusei NOT DETECTED NOT DETECTED Final   Candida  parapsilosis NOT DETECTED NOT DETECTED Final   Candida tropicalis NOT DETECTED NOT DETECTED Final    Comment: Performed at [facility], [address], [place], [place] [address]         Radiology Studies: Mr Brain Wo Contrast  Result Date: 03/05/2018 CLINICAL DATA:  Altered level of consciousness. History of  dementia with acute delirium. EXAM: MRI HEAD WITHOUT CONTRAST TECHNIQUE: Multiplanar, multiecho pulse sequences of the brain and surrounding structures were obtained without intravenous contrast. COMPARISON:  Head CT 08/24/2017 FINDINGS: Brain: Axial diffusion imaging is diagnostic and negative for infarct. Generalized atrophy. Low-density throughout the cerebral white matter consistent with chronic small vessel ischemia, grossly stable when compared to 2017. No hydrocephalus or shift. Vascular: Grossly preserved flow voids on axial T2 weighted imaging. Skull and upper cervical spine: No gross destructive process. Sinuses/Orbits: No acute finding.  Tornwaldt cyst, incidental. Other: Significant motion degradation, only diffusion and T2 weighted imaging was acquired. IMPRESSION: 1. Significantly motion degraded, only diffusion and T2 weighted imaging could be acquired. 2. Negative for infarct or other acute finding. 3. 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 Electronically Signed   By: Marnee Spring M.D.   On: 03/05/2018 12:37        Scheduled Meds: . ammonium lactate   Topical Daily  . calcitRIOL  1 mcg Oral Q M,W,F-HD  . calcium acetate  2,001 mg Oral TID WC  . Chlorhexidine Gluconate Cloth  6 each Topical Q0600  . Chlorhexidine Gluconate Cloth  6 each Topical Q0600  . ferric citrate  630 mg Oral TID WC  . heparin  5,000 Units Subcutaneous Q8H  . metoprolol succinate  12.5 mg Oral Daily  . nicotine  21 mg Transdermal Daily  . pantoprazole  40 mg Oral Daily  . triamcinolone cream   Topical Daily   Continuous Infusions: . dextrose 40 mL/hr at 03/04/18  2257  . piperacillin-tazobactam (ZOSYN)  IV 3.375 g (03/05/18 1215)  . vancomycin       LOS: 10 days    Time spent: 35 minutes    Kathlen Mody, MD [facility] Pager [phone] If 7PM-7AM, please contact night-coverage [email or web address] Password [facility] 03/05/2018, 12:48 PM

## 2018-03-05 NOTE — Progress Notes (Signed)
Physical Therapy Treatment Patient Details Name: Shane [name] MRN: [id number] DOB: [birth year] Today's Date: 03/05/2018    History of Present Illness The patient is a 77 y.o. year-old with hx AL amyloid, ESRD on HD, atrial fib who presents to ED with sob and resp distress.  Was on bipap briefly in ed but didn't tolerate well. Pt missed WEd and Fri Dialysis treatments last week and had been leaving early in treatments prior to that.      PT Comments    Pt lethargic during most of treatment. Would arouse to stimuli but limited interaction until later in treatment. Pt had ativan earlier when going for MRI. Daughter present for treatment.   Follow Up Recommendations  SNF;Supervision/Assistance - 24 hour     Equipment Recommendations  Other (comment)(To be assessed)    Recommendations for Other Services       Precautions / Restrictions Precautions Precautions: Fall Restrictions Weight Bearing Restrictions: No    Mobility  Bed Mobility Overal bed mobility: Needs Assistance Bed Mobility: Supine to Sit;Sit to Supine     Supine to sit: +2 for physical assistance;Total assist Sit to supine: Total assist;+2 for physical assistance   General bed mobility comments: Assist for all aspects.  Transfers                    Ambulation/Gait                 Stairs             Wheelchair Mobility    Modified Rankin (Stroke Patients Only)       Balance Overall balance assessment: Needs assistance Sitting-balance support: Bilateral upper extremity supported;Feet supported Sitting balance-Leahy Scale: Poor Sitting balance - Comments: Sat EOB x 12 minutes with mod assist to min guard. Pt with posterior and rt lean. Toward end of sitting pt more alert and sat with min guard. Postural control: Posterior lean;Right lateral lean                                  Cognition Arousal/Alertness: Lethargic Behavior During Therapy: Flat affect Overall  Cognitive Status: Impaired/Different from baseline Area of Impairment: Problem solving;Safety/judgement;Orientation;Following commands                 Orientation Level: Disoriented to;Place;Time;Situation;Person     Following Commands: (Did not follow any commands) Safety/Judgement: Decreased awareness of safety;Decreased awareness of deficits   Problem Solving: Slow processing;Decreased initiation;Difficulty sequencing;Requires verbal cues;Requires tactile cues General Comments: Pt lethargic. Eyes closed most of session. Pt did arouse more toward end of session.      Exercises      General Comments        Pertinent Vitals/Pain Pain Assessment: Faces Faces Pain Scale: No hurt    Home Living                      Prior Function            PT Goals (current goals can now be found in the care plan section) Progress towards PT goals: Not progressing toward goals - comment    Frequency    Min 2X/week      PT Plan Current plan remains appropriate    Co-evaluation PT/OT/SLP Co-Evaluation/Treatment: Yes Reason for Co-Treatment: For patient/therapist safety PT goals addressed during session: Mobility/safety with mobility;Balance        AM-PAC PT "6 Clicks"  Daily Activity  Outcome Measure  Difficulty turning over in bed (including adjusting bedclothes, sheets and blankets)?: Unable Difficulty moving from lying on back to sitting on the side of the bed? : Unable Difficulty sitting down on and standing up from a chair with arms (e.g., wheelchair, bedside commode, etc,.)?: Unable Help needed moving to and from a bed to chair (including a wheelchair)?: Total Help needed walking in [name] room?: Total Help needed climbing 3-5 steps with a railing? : Total 6 Click Score: 6    End of Session   Activity Tolerance: Patient limited by lethargy Patient left: in bed;with call bell/phone within reach;with bed alarm set;with family/visitor present   PT  Visit Diagnosis: Other abnormalities of gait and mobility (R26.89);Muscle weakness (generalized) (M62.81)     Time: 1478-2956 PT Time Calculation (min) (ACUTE ONLY): 19 min  Charges:                       G Codes:       [facility] [name] PT [phone]    Shane [name] 03/05/2018, 3:36 PM

## 2018-03-06 DIAGNOSIS — I10 Essential (primary) hypertension: Secondary | ICD-10-CM

## 2018-03-06 DIAGNOSIS — K219 Gastro-esophageal reflux disease without esophagitis: Secondary | ICD-10-CM

## 2018-03-06 DIAGNOSIS — J189 Pneumonia, unspecified organism: Secondary | ICD-10-CM

## 2018-03-06 LAB — RENAL FUNCTION PANEL
ANION GAP: 11 (ref 5–15)
Albumin: 2.7 g/dL — ABNORMAL LOW (ref 3.5–5.0)
BUN: 21 mg/dL — ABNORMAL HIGH (ref 6–20)
CHLORIDE: 94 mmol/L — AB (ref 101–111)
CO2: 28 mmol/L (ref 22–32)
Calcium: 9.4 mg/dL (ref 8.9–10.3)
Creatinine, Ser: 6.74 mg/dL — ABNORMAL HIGH (ref 0.61–1.24)
GFR calc Af Amer: 8 mL/min — ABNORMAL LOW (ref >60)
GFR calc non Af Amer: 7 mL/min — ABNORMAL LOW (ref >60)
GLUCOSE: 84 mg/dL (ref 65–99)
POTASSIUM: 4.4 mmol/L (ref 3.5–5.1)
Phosphorus: 2.5 mg/dL (ref 2.5–4.6)
Sodium: 133 mmol/L — ABNORMAL LOW (ref 135–145)

## 2018-03-06 LAB — CBC
HCT: 30.4 % — ABNORMAL LOW (ref 39.0–52.0)
Hemoglobin: 9.7 g/dL — ABNORMAL LOW (ref 13.0–17.0)
MCH: 31.7 pg (ref 26.0–34.0)
MCHC: 31.9 g/dL (ref 30.0–36.0)
MCV: 99.3 fL (ref 78.0–100.0)
Platelets: 178 10*3/uL (ref 150–400)
RBC: 3.06 MIL/uL — ABNORMAL LOW (ref 4.22–5.81)
RDW: 13.7 % (ref 11.5–15.5)
WBC: 5.8 10*3/uL (ref 4.0–10.5)

## 2018-03-06 LAB — TSH: TSH: 1.143 u[IU]/mL (ref 0.350–4.500)

## 2018-03-06 LAB — GLUCOSE, CAPILLARY
GLUCOSE-CAPILLARY: 116 mg/dL — AB (ref 65–99)
Glucose-Capillary: 104 mg/dL — ABNORMAL HIGH (ref 65–99)
Glucose-Capillary: 104 mg/dL — ABNORMAL HIGH (ref 65–99)
Glucose-Capillary: 75 mg/dL (ref 65–99)
Glucose-Capillary: 79 mg/dL (ref 65–99)

## 2018-03-06 LAB — CULTURE, BLOOD (ROUTINE X 2): SPECIAL REQUESTS: ADEQUATE

## 2018-03-06 MED ORDER — ORAL CARE MOUTH RINSE
15.0000 mL | Freq: Two times a day (BID) | OROMUCOSAL | Status: DC
  Administered 2018-03-06 – 2018-03-10 (×5): 15 mL via OROMUCOSAL

## 2018-03-06 MED ORDER — LORAZEPAM 2 MG/ML IJ SOLN
INTRAMUSCULAR | Status: AC
  Filled 2018-03-06: qty 1

## 2018-03-06 MED ORDER — CALCITRIOL 0.5 MCG PO CAPS
ORAL_CAPSULE | ORAL | Status: AC
  Filled 2018-03-06: qty 2

## 2018-03-06 MED ORDER — DEXTROSE 10% NICU IV INFUSION SIMPLE
INJECTION | INTRAVENOUS | Status: DC
  Administered 2018-03-06: 30 mL/h via INTRAVENOUS
  Filled 2018-03-06 (×4): qty 500

## 2018-03-06 MED ORDER — SODIUM CHLORIDE 0.9 % IV SOLN
2.0000 g | INTRAVENOUS | Status: AC
  Administered 2018-03-06 – 2018-03-09 (×4): 2 g via INTRAVENOUS
  Filled 2018-03-06 (×5): qty 20

## 2018-03-06 NOTE — Progress Notes (Signed)
[facility] Progress Note  Subjective: pt confused  Vitals:   03/06/18 0930 03/06/18 1000 03/06/18 1030 03/06/18 1100  BP: (!) 110/56 96/62 102/60 110/73  Pulse: 77 80 88 90  Resp: Temp:      TempSrc:      SpO2:      Weight:      Height:        Inpatient medications: . ammonium lactate   Topical Daily  . calcitRIOL  1 mcg Oral Q M,W,F-HD  . calcium acetate  2,001 mg Oral TID WC  . Chlorhexidine Gluconate Cloth  6 each Topical Q0600  . ferric citrate  630 mg Oral TID WC  . heparin  5,000 Units Subcutaneous Q8H  . LORazepam      . mouth rinse  15 mL Mouth Rinse BID  . metoprolol succinate  12.5 mg Oral Daily  . nicotine  21 mg Transdermal Daily  . pantoprazole  40 mg Oral Daily  . triamcinolone cream   Topical Daily   . dextrose 500 mL (03/06/18 1059)  . piperacillin-tazobactam (ZOSYN)  IV Stopped (03/06/18 0140)   acetaminophen **OR** acetaminophen, albuterol, bisacodyl, dextrose, diphenhydrAMINE, haloperidol lactate, LORazepam, ondansetron **OR** ondansetron (ZOFRAN) IV, oxyCODONE, senna-docusate  Exam:  on hd not in distress confused   no jvd  chest cta bilat  cor reg no mrg  abd soft ntnd scaphoid  ext no sig leg edema  left ua avf +bruit  confused  cxr 5/26 - retrocardiac density on left  Dialysis: mwf   4h  65kg  2/2.25 bath left arm avf  Heparin none      Impression: 1 altered mental status - remains confused prob underlying dementia, requiring IV haldol for agitation prn 2 esrd on dialysis mwf hx of amyloid nephropathy; poor compliance w op dialysis signs off early very frequently 3 volume is stable under dry wt 4 anemia ckd hb 9- 10  here 5 hypertension on metoprolol xl only 6 fevers - in hospital +strep in blood, prob pna on IV abx 7 eol - for pall care meeting w/ family tomorrow  Plan - dialysis today  Vinson Moselle MD [facility] pager [phone]   03/06/2018, 12:56 PM   Recent Labs  Lab  03/01/18 1328 03/02/18 0923 03/04/18 0804 03/06/18 0846  NA 135 135 132* 133*  K 3.9 4.0 5.1 4.4  CL 96* 94* 92* 94*  CO2 GLUCOSE 91 89 94 84  BUN 14 9 30* 21*  CREATININE 6.13* 4.90* 8.54* 6.74*  CALCIUM 8.7* 8.9 9.5 9.4  PHOS 4.7*  --  3.4 2.5   Recent Labs  Lab 03/01/18 1328 03/04/18 0804 03/06/18 0846  ALBUMIN 3.1* 2.8* 2.7*   Recent Labs  Lab 02/28/18 0809  03/02/18 0923 03/04/18 0805 03/06/18 0846  WBC 6.2   < > 6.1 7.2 5.8  NEUTROABS 4.4  --   --   --   --   HGB 10.1*   < > 10.4* 9.5* 9.7*  HCT 31.6*   < > 32.0* 29.5* 30.4*  MCV 101.3*   < > 99.1 100.0 99.3  PLT 125*   < > 147* 133* 178   < > = values in this interval not displayed.   Iron/TIBC/Ferritin/ %Sat No results found for: IRON, TIBC, FERRITIN, IRONPCTSAT

## 2018-03-06 NOTE — Progress Notes (Signed)
PROGRESS NOTE  Shane [name]  ZOX:[id number] DOB: 07-28-1941 DOA: 02/23/2018 PCP: [name], MD   Brief Narrative: Shane [name] is a 77 y.o. 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 Unfortunately now suffering from agitated delirium, decreased po intake, and subsequent hypoglycemic episodes. Work up including MRI brain showed no reversible or organic causes for encephalopathy.   Assessment & Plan: Active Problems:   Hypertension   ESRD needing dialysis (HCC)   Goals of care, counseling/discussion   GERD (gastroesophageal reflux disease)   Acute respiratory failure with hypoxia (HCC)   SOB (shortness of breath)   Hypoxia   Acute delirium   Dementia with behavioral disturbance   Palliative care by specialist   HCAP (healthcare-associated pneumonia)   Streptococcal bacteremia   Hypoglycemia   ESRD on dialysis [facility])   Palliative care encounter  Acute encephalopathy on chronic dementia: Likely element of acute delirium, possibly worsened by pneumonia, hypoglycemia, respiratory distress on decreased cerebral reserve. Ammonia, B12 wnl.  - Check TSH, cortisol (esp. w/hypoglycemia, soft BPs) for completeness. - Continue to optimize medical management of comorbidities.  - Delirium precautions.  - Minimize sedating medications.   Hypoglycemia: Recurrent, due to decreased po intake.  - Monitor CBG q4h, D50 push prn. PO intake improved this PM. Hoping to avoid excess volume with ESRD/pulmonary edema at admission.   Dysphagia: Unclear precipitant with no stroke on imaging, possibly related to mental status changes.  - Continue SLP evaluation, currently on dysphagia 3 diet, nectar-thickened liquids.   ESRD:  - HD per nephrology. No longer hyperkalemic or in respiratory distress due to  pulmonary edema.   LLL pneumonia: Fever on 5/26, no leukocytosis. Started vanc/unasyn, converted to zosyn.  - Continue antibiotics, transition to ceftriaxone for 7 days Tx (5/26 - 6/1) followed by keflex (as below for possible bacteremia) x7 days. If not improving, consider anaerobic coverage with AMS/dysphagia.   S. viridans in 1 of 4 blood cultures: More consistent with contaminant, but susceptible to cephalosporins as above.  - Continue CTX to complete 7 days IV Tx, then 7 days po keflex. Unless recurrent fever, would not extend duration of abx.  - Redraw blood cultures to document clearance.  Chronic AFib: Rate-controlled - Not on anticoagulation due to nonadherence  HTN: Well-controlled.  - Continue medications  COPD: No wheezing.  - Consider ABG during period of lethargy to r/o CO2 retention (though none was found on VBG 5/18) - Prn nebs  DVT prophylaxis: Heparin Code Status: Full Family Communication: None at bedside this AM Disposition Plan: SNF when medically stable  Consultants:   None  Procedures:   None  Antimicrobials:  Vancomycin 5/26 - 5/27  Unasyn 5/26  Zosyn 5/27 - 5/29  Ceftriaxone 5/29 - 6/1  Keflex 6/2 - 6/8  Subjective: Confused. Denies pain. No events per RN staff.  Objective: Vitals:   03/06/18 1000 03/06/18 1030 03/06/18 1100 03/06/18 1605  BP: 96/62 102/60 110/73 (!) 102/56  Pulse: 80 88 90   Resp: Temp:      TempSrc:      SpO2:      Weight:      Height:        Intake/Output Summary (Last 24 hours) at 03/06/2018 1839 Last data filed at 03/06/2018 1746 Gross per 24 hour  Intake 2670 ml  Output 0 ml  Net 2670 ml   Filed Weights   03/05/18 0415 03/06/18 0421 03/06/18 0819  Weight: 59.1 kg (130 lb 4.7 oz) 59 kg (130 lb 1.1 oz) 60 kg (132 lb 4.4 oz)    Gen: 77 y.o. male in no distress Pulm: Non-labored breathing. Clear to auscultation bilaterally.  CV: Regular rate and rhythm. No murmur, rub, or gallop. No JVD, no  pedal edema. GI: Abdomen soft, non-tender, non-distended, with normoactive bowel sounds. No organomegaly or masses felt. Ext: Warm, no deformities Skin: No rashes, lesions or ulcers on limited exam Neuro: Drowsy but rousable, not cooperative with exam but moves all extremities. Psych: Judgement and insight impaired. No agitation   Data Reviewed: I have personally reviewed following labs and imaging studies  CBC: Recent Labs  Lab 02/28/18 0809 03/01/18 1328 03/02/18 0923 03/04/18 0805 03/06/18 0846  WBC 6.2 5.7 6.1 7.2 5.8  NEUTROABS 4.4  --   --   --   --   HGB 10.1* 10.4* 10.4* 9.5* 9.7*  HCT 31.6* 32.5* 32.0* 29.5* 30.4*  MCV 101.3* 101.2* 99.1 100.0 99.3  PLT 125* 137* 147* 133* 178   Basic Metabolic Panel: Recent Labs  Lab 02/28/18 0800  03/01/18 1050 03/01/18 1328 03/02/18 0923 03/04/18 0804 03/06/18 0846  NA 137  --  136 135 135 132* 133*  K 4.1  --  4.1 3.9 4.0 5.1 4.4  CL 97*  --  94* 96* 94* 92* 94*  CO2 27  --  GLUCOSE 94   < > 92 91 89 94 84  BUN 28*  --  30* 21*  CREATININE 8.45*  --  6.05* 6.13* 4.90* 8.54* 6.74*  CALCIUM 8.8*  --  8.6* 8.7* 8.9 9.5 9.4  PHOS 7.2*  --   --  4.7*  --  3.4 2.5   < > = values in this interval not displayed.   GFR: Estimated Creatinine Clearance: 7.9 mL/min (A) (by C-G formula based on SCr of 6.74 mg/dL (H)). Liver Function Tests: Recent Labs  Lab 02/28/18 0800 03/01/18 1328 03/04/18 0804 03/06/18 0846  ALBUMIN 3.0* 3.1* 2.8* 2.7*   No results for input(s): LIPASE, AMYLASE in the last 168 hours. No results for input(s): AMMONIA in the last 168 hours. Coagulation Profile: No results for input(s): INR, PROTIME in the last 168 hours. Cardiac Enzymes: No results for input(s): CKTOTAL, CKMB, CKMBINDEX, TROPONINI in the last 168 hours. BNP (last 3 results) No results for input(s): PROBNP in the last 8760 hours. HbA1C: No results for input(s): HGBA1C in the last 72 hours. CBG: Recent Labs    Lab 03/05/18 2027 03/06/18 0024 03/06/18 0508 03/06/18 1146 03/06/18 1705  GLUCAP 83 79 75 104* 104*   Lipid Profile: No results for input(s): CHOL, HDL, LDLCALC, TRIG, CHOLHDL, LDLDIRECT in the last 72 hours. Thyroid Function Tests: No results for input(s): TSH, T4TOTAL, FREET4, T3FREE, THYROIDAB in the last 72 hours. Anemia Panel: No results for input(s): VITAMINB12, FOLATE, FERRITIN, TIBC, IRON, RETICCTPCT in the last 72 hours. Urine analysis:    Component Value Date/Time   COLORURINE AMBER (A) 01/25/2018 1540   APPEARANCEUR CLEAR 01/25/2018 1540   LABSPEC 1.014 01/25/2018 1540   PHURINE 7.0 01/25/2018 1540   GLUCOSEU 50 (A) 01/25/2018 1540   HGBUR NEGATIVE 01/25/2018 1540   BILIRUBINUR NEGATIVE 01/25/2018 1540   KETONESUR NEGATIVE 01/25/2018 1540   PROTEINUR 100 (A) 01/25/2018 1540   UROBILINOGEN  0.2 08/01/2015 1430   NITRITE NEGATIVE 01/25/2018 1540   LEUKOCYTESUR NEGATIVE 01/25/2018 1540   Recent Results (from the past 240 hour(s))  Culture, blood (routine x 2)     Status: Abnormal   Collection Time: 03/03/18 10:29 AM  Result Value Ref Range Status   Specimen Description BLOOD SITE NOT SPECIFIED  Final   Special Requests   Final    BOTTLES DRAWN AEROBIC ONLY Blood Culture adequate volume   Culture  Setup Time   Final    GRAM POSITIVE COCCI IN CHAINS AEROBIC BOTTLE ONLY CRITICAL RESULT CALLED TO, READ BACK BY AND VERIFIED WITH: C ROBERTSON,PHARMD AT 1414 03/04/18 BY L BENFIELD Performed at [facility], [address] [place], [place] [address]    Culture VIRIDANS STREPTOCOCCUS (A)  Final   Report Status 03/06/2018 FINAL  Final   Organism ID, Bacteria VIRIDANS STREPTOCOCCUS  Final      Susceptibility   Viridans streptococcus - MIC*    PENICILLIN 0.5 INTERMEDIATE Intermediate     CEFTRIAXONE 0.25 SENSITIVE Sensitive     ERYTHROMYCIN 2 RESISTANT Resistant     LEVOFLOXACIN 1 SENSITIVE Sensitive     VANCOMYCIN 0.25 SENSITIVE Sensitive     * VIRIDANS  STREPTOCOCCUS  Culture, blood (routine x 2)     Status: None (Preliminary result)   Collection Time: 03/03/18 10:29 AM  Result Value Ref Range Status   Specimen Description BLOOD SITE NOT SPECIFIED  Final   Special Requests   Final    BOTTLES DRAWN AEROBIC ONLY Blood Culture adequate volume   Culture   Final    NO GROWTH 3 DAYS Performed at [facility], [address], [place], [place] [address]    Report Status PENDING  Incomplete  Blood Culture ID Panel (Reflexed)     Status: Abnormal   Collection Time: 03/03/18 10:29 AM  Result Value Ref Range Status   Enterococcus species NOT DETECTED NOT DETECTED Final   Listeria monocytogenes NOT DETECTED NOT DETECTED Final   Staphylococcus species NOT DETECTED NOT DETECTED Final   Staphylococcus aureus NOT DETECTED NOT DETECTED Final   Streptococcus species DETECTED (A) NOT DETECTED Final    Comment: Not Enterococcus species, Streptococcus agalactiae, Streptococcus pyogenes, or Streptococcus pneumoniae. CRITICAL RESULT CALLED TO, READ BACK BY AND VERIFIED WITH: C ROBERTSON,PHARMD AT 1414 03/04/18 BY L BENFIELD    Streptococcus agalactiae NOT DETECTED NOT DETECTED Final   Streptococcus pneumoniae NOT DETECTED NOT DETECTED Final   Streptococcus pyogenes NOT DETECTED NOT DETECTED Final   Acinetobacter baumannii NOT DETECTED NOT DETECTED Final   Enterobacteriaceae species NOT DETECTED NOT DETECTED Final   Enterobacter cloacae complex NOT DETECTED NOT DETECTED Final   Escherichia coli NOT DETECTED NOT DETECTED Final   Klebsiella oxytoca NOT DETECTED NOT DETECTED Final   Klebsiella pneumoniae NOT DETECTED NOT DETECTED Final   Proteus species NOT DETECTED NOT DETECTED Final   Serratia marcescens NOT DETECTED NOT DETECTED Final   Haemophilus influenzae NOT DETECTED NOT DETECTED Final   Neisseria meningitidis NOT DETECTED NOT DETECTED Final   Pseudomonas aeruginosa NOT DETECTED NOT DETECTED Final   Candida albicans NOT DETECTED NOT DETECTED  Final   Candida glabrata NOT DETECTED NOT DETECTED Final   Candida krusei NOT DETECTED NOT DETECTED Final   Candida parapsilosis NOT DETECTED NOT DETECTED Final   Candida tropicalis NOT DETECTED NOT DETECTED Final    Comment: Performed at [facility], [address], [place], [place] [address]  Radiology Studies: Mr Brain Wo Contrast  Result Date: 03/05/2018 CLINICAL DATA:  Altered level of consciousness. History of dementia with acute delirium. EXAM: MRI HEAD WITHOUT CONTRAST TECHNIQUE: Multiplanar, multiecho pulse sequences of the brain and surrounding structures were obtained without intravenous contrast. COMPARISON:  Head CT 08/24/2017 FINDINGS: Brain: Axial diffusion imaging is diagnostic and negative for infarct. Generalized atrophy. Low-density throughout the cerebral white matter consistent with chronic small vessel ischemia, grossly stable when compared to 2017. No hydrocephalus or shift. Vascular: Grossly preserved flow voids on axial T2 weighted imaging. Skull and upper cervical spine: No gross destructive process. Sinuses/Orbits: No acute finding.  Tornwaldt cyst, incidental. Other: Significant motion degradation, only diffusion and T2 weighted imaging was acquired. IMPRESSION: 1. Significantly motion degraded, only diffusion and T2 weighted imaging could be acquired. 2. Negative for infarct or other acute finding. 3. 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 Electronically Signed   By: Marnee Spring M.D.   On: 03/05/2018 12:37    Scheduled Meds: . ammonium lactate   Topical Daily  . calcitRIOL  1 mcg Oral Q M,W,F-HD  . Chlorhexidine Gluconate Cloth  6 each Topical Q0600  . heparin  5,000 Units Subcutaneous Q8H  . LORazepam      . mouth rinse  15 mL Mouth Rinse BID  . metoprolol succinate  12.5 mg Oral Daily  . nicotine  21 mg Transdermal Daily  . pantoprazole  40 mg Oral Daily  . triamcinolone cream   Topical Daily   Continuous Infusions: .  dextrose 10 % 30 mL/hr (03/06/18 1556)  . dextrose Stopped (03/06/18 1556)  . piperacillin-tazobactam (ZOSYN)  IV 3.375 g (03/06/18 1441)     LOS: 11 days   Time spent: 25 minutes.  Tyrone Nine, MD [facility] [email or web address] Password [facility] 03/06/2018, 6:39 PM

## 2018-03-06 NOTE — Progress Notes (Signed)
SLP Cancellation Note  Patient Details Name: Shane [name] MRN: [id number] DOB: [birth year]   Cancelled treatment:       Reason Eval/Treat Not Completed: Patient at procedure or test/unavailable(pt traveling to xray, will continue efforts)   Chales Abrahams 03/06/2018, 8:12 AM  Donavan Burnet, MS [facility] SLP [phone]

## 2018-03-07 LAB — GLUCOSE, CAPILLARY
GLUCOSE-CAPILLARY: 92 mg/dL (ref 65–99)
GLUCOSE-CAPILLARY: 93 mg/dL (ref 65–99)
Glucose-Capillary: 108 mg/dL — ABNORMAL HIGH (ref 65–99)
Glucose-Capillary: 96 mg/dL (ref 65–99)
Glucose-Capillary: 98 mg/dL (ref 65–99)

## 2018-03-07 LAB — CORTISOL-AM, BLOOD: CORTISOL - AM: 14.1 ug/dL (ref 6.7–22.6)

## 2018-03-07 MED ORDER — CHLORHEXIDINE GLUCONATE CLOTH 2 % EX PADS: 6.0000 | MEDICATED_PAD | Freq: Every day | CUTANEOUS | Status: DC

## 2018-03-07 MED ORDER — NICOTINE 21 MG/24HR TD PT24: 21.0000 mg | MEDICATED_PATCH | Freq: Every day | TRANSDERMAL | Status: DC | PRN

## 2018-03-07 NOTE — Progress Notes (Signed)
Accompanied nurse tech to obtain patient's blood sugar however patient is not alert, strikes out when attempts are made to fully waken for fingerstick. Will attempt again at midnight. Will continue to monitor.

## 2018-03-07 NOTE — Progress Notes (Signed)
  Speech Language Pathology Treatment: Dysphagia  Patient Details Name: SAXTON CHAIN MRN: [id number] DOB: July 07, 1941 Today's Date: 03/07/2018 Time: 4098-1191 SLP Time Calculation (min) (ACUTE ONLY): 11 min  Assessment / Plan / Recommendation Clinical Impression  Pt finishing up with PT. He is more lethargic today, kept eyes closed but responded to most questions. Needed tactile cues with cup to lip to recognize/prepare for sips (2). No s/s aspiration however did not give additional liquid due to lethargy. Diet downgraded by this SLP 5/28 due to inefficient mastication which appears to be appropriate. Palliative care meeting today. Continue nectar thick liquids and Dys 1 (puree) diet.    HPI HPI: The patient is a 77 y.o. year-old with hx AL amyloid, ESRD on HD, atrial fib who presents to ED with sob and resp distress. Per chart missed Wed and Fri Dialysis treatments last week and had been leaving early in treatments prior to that. Found to be grossly volume overloaded and hyperkalemic. CXR Small left pleural effusion noted. Mild left basilar airspace opacity may reflect atelectasis or possibly mild asymmetric interstitial edema.`      SLP Plan  Continue with current plan of care       Recommendations  Liquids provided via: Cup;No straw Medication Administration: Crushed with puree Supervision: Staff to assist with self feeding;Full supervision/cueing for compensatory strategies;Patient able to self feed Compensations: Slow rate;Small sips/bites;Minimize environmental distractions;Lingual sweep for clearance of pocketing Postural Changes and/or Swallow Maneuvers: Seated upright 90 degrees                Oral Care Recommendations: Oral care BID Follow up Recommendations: Skilled Nursing facility SLP Visit Diagnosis: Dysphagia, oropharyngeal phase (R13.12) Plan: Continue with current plan of care                       Royce Macadamia 03/07/2018, 11:44 AM  Breck Coons Lonell [employer] [phone]

## 2018-03-07 NOTE — Progress Notes (Signed)
Physical Therapy Treatment Patient Details Name: Shane [name] MRN: [id number] DOB: 1941-02-04 Today's Date: 03/07/2018    History of Present Illness The patient is a 77 y.o. year-old with hx AL amyloid, ESRD on HD, atrial fib who presents to ED with sob and resp distress.  Was on bipap briefly in ed but didn't tolerate well. Pt missed WEd and Fri Dialysis treatments last week and had been leaving early in treatments prior to that.      PT Comments    Pt remains limited by lethargy and cognition.   Follow Up Recommendations  SNF;Supervision/Assistance - 24 hour     Equipment Recommendations  Other (comment)(To be assessed)    Recommendations for Other Services       Precautions / Restrictions Precautions Precautions: Fall Restrictions Weight Bearing Restrictions: No    Mobility  Bed Mobility Overal bed mobility: Needs Assistance Bed Mobility: Supine to Sit;Sit to Supine     Supine to sit: +2 for physical assistance;Total assist Sit to supine: Total assist;+2 for physical assistance   General bed mobility comments: Assist for all aspects.  Transfers                 General transfer comment: Attempted x 2 but pt not initiating or assisting with any attempt.   Ambulation/Gait                 Stairs             Wheelchair Mobility    Modified Rankin (Stroke Patients Only)       Balance Overall balance assessment: Needs assistance Sitting-balance support: Bilateral upper extremity supported;Feet supported Sitting balance-Leahy Scale: Poor Sitting balance - Comments: Sat EOB x 12 minutes with min guard.  Postural control: Posterior lean;Right lateral lean                                  Cognition Arousal/Alertness: Lethargic Behavior During Therapy: Flat affect Overall Cognitive Status: Impaired/Different from baseline Area of Impairment: Problem solving;Safety/judgement;Orientation;Following commands                  Orientation Level: Disoriented to;Place;Time;Situation;Person     Following Commands: (Did not follow any commands) Safety/Judgement: Decreased awareness of safety;Decreased awareness of deficits   Problem Solving: Slow processing;Decreased initiation;Difficulty sequencing;Requires verbal cues;Requires tactile cues General Comments: Pt lethargic. Eyes closed throughout session. Pt did arouse occasionally to verbal and tactile stimuli      Exercises      General Comments        Pertinent Vitals/Pain Pain Assessment: Faces Faces Pain Scale: No hurt Pain Intervention(s): Monitored during session    Home Living                      Prior Function            PT Goals (current goals can now be found in the care plan section) Progress towards PT goals: Not progressing toward goals - comment    Frequency    Min 2X/week      PT Plan Current plan remains appropriate    Co-evaluation              AM-PAC PT "6 Clicks" Daily Activity  Outcome Measure  Difficulty turning over in bed (including adjusting bedclothes, sheets and blankets)?: Unable Difficulty moving from lying on back to sitting on the side of the bed? : Unable  Difficulty sitting down on and standing up from a chair with arms (e.g., wheelchair, bedside commode, etc,.)?: Unable Help needed moving to and from a bed to chair (including a wheelchair)?: Total Help needed walking in hospital room?: Total Help needed climbing 3-5 steps with a railing? : Total 6 Click Score: 6    End of Session Equipment Utilized During Treatment: Gait belt Activity Tolerance: Patient limited by lethargy Patient left: in bed;with call bell/phone within reach;with bed alarm set Nurse Communication: Mobility status(Nurse present ) PT Visit Diagnosis: Other abnormalities of gait and mobility (R26.89);Muscle weakness (generalized) (M62.81)     Time: 4098-1191 PT Time Calculation (min) (ACUTE ONLY): 16  min  Charges:  $Therapeutic Activity: 8-22 mins                    G Codes:       [facility] PT [phone]    Angelina Ok [facility] 03/07/2018, 1:48 PM

## 2018-03-07 NOTE — Progress Notes (Signed)
Met with family and palliative care, plan for now is to let patient wake up and further address goals of care when he is more alert.  Have dc'd prn haldol and oxycodone , please try to avoid all sedating meds for the next 2-3 days.  Will hold off on dialysis as well during this time.    Kelly Splinter MD [employer] pgr [phone]   03/07/2018, 2:45 PM

## 2018-03-07 NOTE — Progress Notes (Signed)
[facility] Progress Note  Subjective: pt confused  Vitals:   03/06/18 1929 03/07/18 0346 03/07/18 0404 03/07/18 0717  BP:   116/69 104/69  Pulse: 83  76 66  Resp: (!) 25  (!) 21 16  Temp: 99.5 F (37.5 C)  98.9 F (37.2 C) 98.4 F (36.9 C)  TempSrc: Axillary  Oral Oral  SpO2: 100%  97% 100%  Weight:  60.4 kg (133 lb 2.5 oz)    Height:        Inpatient medications: . ammonium lactate   Topical Daily  . calcitRIOL  1 mcg Oral Q M,W,F-HD  . Chlorhexidine Gluconate Cloth  6 each Topical Q0600  . heparin  5,000 Units Subcutaneous Q8H  . mouth rinse  15 mL Mouth Rinse BID  . metoprolol succinate  12.5 mg Oral Daily  . nicotine  21 mg Transdermal Daily  . pantoprazole  40 mg Oral Daily  . triamcinolone cream   Topical Daily   . cefTRIAXone (ROCEPHIN)  IV Stopped (03/06/18 2112)  . dextrose 10 % 30 mL/hr (03/06/18 1556)   acetaminophen **OR** acetaminophen, albuterol, bisacodyl, dextrose, diphenhydrAMINE, haloperidol lactate, LORazepam, ondansetron **OR** ondansetron (ZOFRAN) IV, oxyCODONE, senna-docusate  Exam:  on hd not in distress confused   no jvd  chest cta bilat  cor reg no mrg  abd soft ntnd scaphoid  ext no sig leg edema  left ua avf +bruit  confused  cxr 5/26 - retrocardiac density on left  Dialysis: mwf   4h  65kg  2/2.25 bath left arm avf  Heparin none      Impression: 1 altered mental status - remains confused prob underlying dementia, requiring IV haldol for agitation prn 2 esrd on dialysis mwf hx of amyloid nephropathy; poor compliance w op dialysis signs off early very frequently 3 volume is stable under dry wt 4 anemia ckd hb 9- 10  here 5 hypertension on metoprolol xl only 6 fevers/ supsected PNA LLL -  on IV abx 7 strep bacteremia - viridans strep in 1 bottle felt to be prob contaminant 7 eol - for pall care meeting w/ family today  Plan - dialysis friday  Shane Moselle MD [facility] pager [phone]    03/07/2018, 11:01 AM   Recent Labs  Lab 03/01/18 1328 03/02/18 0923 03/04/18 0804 03/06/18 0846  NA 135 135 132* 133*  K 3.9 4.0 5.1 4.4  CL 96* 94* 92* 94*  CO2 GLUCOSE 91 89 94 84  BUN 14 9 30* 21*  CREATININE 6.13* 4.90* 8.54* 6.74*  CALCIUM 8.7* 8.9 9.5 9.4  PHOS 4.7*  --  3.4 2.5   Recent Labs  Lab 03/01/18 1328 03/04/18 0804 03/06/18 0846  ALBUMIN 3.1* 2.8* 2.7*   Recent Labs  Lab 03/02/18 0923 03/04/18 0805 03/06/18 0846  WBC 6.1 7.2 5.8  HGB 10.4* 9.5* 9.7*  HCT 32.0* 29.5* 30.4*  MCV 99.1 100.0 99.3  PLT 147* 133* 178   Iron/TIBC/Ferritin/ %Sat No results found for: IRON, TIBC, FERRITIN, IRONPCTSAT

## 2018-03-07 NOTE — Progress Notes (Addendum)
Palliative Note:   Patient evaluated at bedside. He is lethargic, does not open eyes. Cannot tell me where he is. Mittens in place due to pulling out lines. Family at bedside.  PMT team was contacted by patient's daughter Sharyn Lull for meeting with her family for medical update and options. Met with patient's daughters and sons, and Dr. Jonnie Finner. Reviewed his ongoing illness trajectory, need for sedation during dialysis, new strep bacteremia, and the fact that he remains a poor candidate for outpatient dialysis.  Discussed what stopping dialysis and transitioning to full comfort with option for residential hospice would look like.  Family is considering this option but would first like to hold dialysis for a few days in order to be able to to hold sedating medications and discuss with patient what his wishes would be. They feel that if patient knew that not going to dialysis meant that it would be the end of his life he may be more likely to be more compliant with dialysis.  PMT will followup with patient and family on Sunday.   Shane [name], AGNP-C Palliative Medicine  Please call Palliative Medicine team phone with any questions [phone]. For individual providers please see AMION.  Time in: 1330 Time out: 1430 Total time: 60 mins Prolong services billed: Yes

## 2018-03-07 NOTE — Progress Notes (Addendum)
PROGRESS NOTE  Shane [name]  ZOX:[id number] DOB: 12/09/1940 DOA: 02/23/2018 PCP: [name], MD   Brief Narrative: Shane [name] is a 77 y.o. 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 He has been nonadherent to hemodialysis as an outpatient and very frequently signed off early. Unfortunately now suffering from agitated delirium, decreased po intake, and subsequent hypoglycemic episodes. Work up including MRI brain showed no reversible or organic causes for encephalopathy. Palliative care has been consulted for goals of care and end-of-life discussions.  Assessment & Plan: Active Problems:   Hypertension   ESRD needing dialysis (HCC)   Goals of care, counseling/discussion   GERD (gastroesophageal reflux disease)   Acute respiratory failure with hypoxia (HCC)   SOB (shortness of breath)   Hypoxia   Acute delirium   Dementia with behavioral disturbance   Palliative care by specialist   HCAP (healthcare-associated pneumonia)   Streptococcal bacteremia   Hypoglycemia   ESRD on dialysis [facility])   Palliative care encounter  Acute encephalopathy on chronic dementia: Likely element of acute delirium, possibly worsened by pneumonia, hypoglycemia, respiratory distress on decreased cerebral reserve. Ammonia, B12, TSH, cortisol wnl.  - Continue to optimize medical management of comorbidities.  - Delirium precautions.  - Minimize sedating medications.   Hypoglycemia: Recurrent, due to decreased po intake.  - Monitor CBG q4h, D50 push prn. had to be put back on D10. Hoping to avoid excess volume with ESRD/pulmonary edema at admission.   Dysphagia: Unclear precipitant with no stroke on imaging, possibly related to mental status changes.  - Appreciate serial SLP evaluations, currently on dysphagia 1  diet, nectar-thickened liquids.   ESRD:  - HD per nephrology on schedule. No longer hyperkalemic or in respiratory distress due to pulmonary edema.   LLL pneumonia: Fever on 5/26, no leukocytosis. Started vanc/unasyn, converted to zosyn.  - Continue antibiotics, transition to ceftriaxone for 7 days Tx (5/26 - 6/1) followed by keflex (as below for possible bacteremia) x7 days. If not improving, consider anaerobic coverage with AMS/dysphagia.   S. viridans in 1 of 4 blood cultures: More consistent with contaminant, but susceptible to cephalosporins as above.  - Continue CTX to complete 7 days IV Tx, then 7 days po keflex. Unless recurrent fever, would not extend duration of abx.  - Follow up repeat blood cultures 5/29 to document clearance.  Chronic AFib: Rate-controlled - Not on anticoagulation due to nonadherence  HTN: Well-controlled.  - Continue medications  COPD: No wheezing.  - Consider ABG during period of lethargy to r/o CO2 retention (though none was found on VBG 5/18) - Prn nebs  DVT prophylaxis: Heparin Code Status: Full Family Communication: None at bedside this AM Disposition Plan: SNF when medically stable, depending on palliative care discussions.  Consultants:   Nephrology  Palliative care team  Procedures:   None  Antimicrobials:  Vancomycin 5/26 - 5/27  Unasyn 5/26  Zosyn 5/27 - 5/29  Ceftriaxone 5/29 - 6/1  Keflex 6/2 - 6/8  Subjective: Remains confused, will not open eyes despite saying he is opening his eyes. Continues to deny pain.  Objective: Vitals:   03/06/18 1929 03/07/18 0346 03/07/18 0404 03/07/18 0717  BP:   116/69 104/69  Pulse: 83  76 66  Resp: (!) 25  (!) 21 16  Temp: 99.5 F (37.5 C)  98.9 F (37.2 C) 98.4 F (36.9 C)  TempSrc: Axillary  Oral Oral  SpO2: 100%  97% 100%  Weight:  60.4 kg (133 lb 2.5 oz)    Height:        Intake/Output Summary (Last 24 hours) at 03/07/2018 1320 Last data filed at 03/07/2018 1203 Gross  per 24 hour  Intake 478 ml  Output -  Net 478 ml   Filed Weights   03/06/18 0819 03/06/18 1219 03/07/18 0346  Weight: 60 kg (132 lb 4.4 oz) 60 kg (132 lb 4.4 oz) 60.4 kg (133 lb 2.5 oz)    Gen: 77 y.o. male in no distress Pulm: Non-labored and clear CV: Regular rate and rhythm. No murmur, rub, or gallop. No JVD, no pedal edema. GI: Abdomen soft, non-tender, non-distended, with normoactive bowel sounds. No organomegaly or masses felt. Ext: Warm, no deformities. Mittens on. Skin: No rashes, lesions or ulcers on limited exam Neuro: Drowsy but rousable, not cooperative with exam but moves all extremities. Psych: Judgement and insight impaired. No agitation on exam.   Data Reviewed: I have personally reviewed following labs and imaging studies  CBC: Recent Labs  Lab 03/01/18 1328 03/02/18 0923 03/04/18 0805 03/06/18 0846  WBC 5.7 6.1 7.2 5.8  HGB 10.4* 10.4* 9.5* 9.7*  HCT 32.5* 32.0* 29.5* 30.4*  MCV 101.2* 99.1 100.0 99.3  PLT 137* 147* 133* 178   Basic Metabolic Panel: Recent Labs  Lab 03/01/18 1050 03/01/18 1328 03/02/18 0923 03/04/18 0804 03/06/18 0846  NA 136 135 135 132* 133*  K 4.1 3.9 4.0 5.1 4.4  CL 94* 96* 94* 92* 94*  CO2 GLUCOSE 92 91 89 94 84  BUN 30* 21*  CREATININE 6.05* 6.13* 4.90* 8.54* 6.74*  CALCIUM 8.6* 8.7* 8.9 9.5 9.4  PHOS  --  4.7*  --  3.4 2.5   GFR: Estimated Creatinine Clearance: 8 mL/min (A) (by C-G formula based on SCr of 6.74 mg/dL (H)). Liver Function Tests: Recent Labs  Lab 03/01/18 1328 03/04/18 0804 03/06/18 0846  ALBUMIN 3.1* 2.8* 2.7*   No results for input(s): LIPASE, AMYLASE in the last 168 hours. No results for input(s): AMMONIA in the last 168 hours. Coagulation Profile: No results for input(s): INR, PROTIME in the last 168 hours. Cardiac Enzymes: No results for input(s): CKTOTAL, CKMB, CKMBINDEX, TROPONINI in the last 168 hours. BNP (last 3 results) No results for input(s): PROBNP in the  last 8760 hours. HbA1C: No results for input(s): HGBA1C in the last 72 hours. CBG: Recent Labs  Lab 03/06/18 1705 03/06/18 2057 03/07/18 0034 03/07/18 0357 03/07/18 1157  GLUCAP 104* 116* 96 108* 98   Lipid Profile: No results for input(s): CHOL, HDL, LDLCALC, TRIG, CHOLHDL, LDLDIRECT in the last 72 hours. Thyroid Function Tests: Recent Labs    03/06/18 2000  TSH 1.143   Anemia Panel: No results for input(s): VITAMINB12, FOLATE, FERRITIN, TIBC, IRON, RETICCTPCT in the last 72 hours. Urine analysis:    Component Value Date/Time   COLORURINE AMBER (A) 01/25/2018 1540   APPEARANCEUR CLEAR 01/25/2018 1540   LABSPEC 1.014 01/25/2018 1540   PHURINE 7.0 01/25/2018 1540   GLUCOSEU 50 (A) 01/25/2018 1540   HGBUR NEGATIVE 01/25/2018 1540   BILIRUBINUR NEGATIVE 01/25/2018 1540   KETONESUR NEGATIVE 01/25/2018 1540   PROTEINUR 100 (A) 01/25/2018 1540   UROBILINOGEN 0.2 08/01/2015 1430   NITRITE NEGATIVE 01/25/2018 1540   LEUKOCYTESUR NEGATIVE 01/25/2018 1540   Recent Results (from the  past 240 hour(s))  Culture, blood (routine x 2)     Status: Abnormal   Collection Time: 03/03/18 10:29 AM  Result Value Ref Range Status   Specimen Description BLOOD SITE NOT SPECIFIED  Final   Special Requests   Final    BOTTLES DRAWN AEROBIC ONLY Blood Culture adequate volume   Culture  Setup Time   Final    GRAM POSITIVE COCCI IN CHAINS AEROBIC BOTTLE ONLY CRITICAL RESULT CALLED TO, READ BACK BY AND VERIFIED WITH: C ROBERTSON,PHARMD AT 1414 03/04/18 BY L BENFIELD Performed at [facility], [address] [place], [place] [address]    Culture VIRIDANS STREPTOCOCCUS (A)  Final   Report Status 03/06/2018 FINAL  Final   Organism ID, Bacteria VIRIDANS STREPTOCOCCUS  Final      Susceptibility   Viridans streptococcus - MIC*    PENICILLIN 0.5 INTERMEDIATE Intermediate     CEFTRIAXONE 0.25 SENSITIVE Sensitive     ERYTHROMYCIN 2 RESISTANT Resistant     LEVOFLOXACIN 1 SENSITIVE Sensitive       VANCOMYCIN 0.25 SENSITIVE Sensitive     * VIRIDANS STREPTOCOCCUS  Culture, blood (routine x 2)     Status: None (Preliminary result)   Collection Time: 03/03/18 10:29 AM  Result Value Ref Range Status   Specimen Description BLOOD SITE NOT SPECIFIED  Final   Special Requests   Final    BOTTLES DRAWN AEROBIC ONLY Blood Culture adequate volume   Culture   Final    NO GROWTH 3 DAYS Performed at [facility], [address] [place], [place] [address]    Report Status PENDING  Incomplete  Blood Culture ID Panel (Reflexed)     Status: Abnormal   Collection Time: 03/03/18 10:29 AM  Result Value Ref Range Status   Enterococcus species NOT DETECTED NOT DETECTED Final   Listeria monocytogenes NOT DETECTED NOT DETECTED Final   Staphylococcus species NOT DETECTED NOT DETECTED Final   Staphylococcus aureus NOT DETECTED NOT DETECTED Final   Streptococcus species DETECTED (A) NOT DETECTED Final    Comment: Not Enterococcus species, Streptococcus agalactiae, Streptococcus pyogenes, or Streptococcus pneumoniae. CRITICAL RESULT CALLED TO, READ BACK BY AND VERIFIED WITH: C ROBERTSON,PHARMD AT 1414 03/04/18 BY L BENFIELD    Streptococcus agalactiae NOT DETECTED NOT DETECTED Final   Streptococcus pneumoniae NOT DETECTED NOT DETECTED Final   Streptococcus pyogenes NOT DETECTED NOT DETECTED Final   Acinetobacter baumannii NOT DETECTED NOT DETECTED Final   Enterobacteriaceae species NOT DETECTED NOT DETECTED Final   Enterobacter cloacae complex NOT DETECTED NOT DETECTED Final   Escherichia coli NOT DETECTED NOT DETECTED Final   Klebsiella oxytoca NOT DETECTED NOT DETECTED Final   Klebsiella pneumoniae NOT DETECTED NOT DETECTED Final   Proteus species NOT DETECTED NOT DETECTED Final   Serratia marcescens NOT DETECTED NOT DETECTED Final   Haemophilus influenzae NOT DETECTED NOT DETECTED Final   Neisseria meningitidis NOT DETECTED NOT DETECTED Final   Pseudomonas aeruginosa NOT DETECTED NOT  DETECTED Final   Candida albicans NOT DETECTED NOT DETECTED Final   Candida glabrata NOT DETECTED NOT DETECTED Final   Candida krusei NOT DETECTED NOT DETECTED Final   Candida parapsilosis NOT DETECTED NOT DETECTED Final   Candida tropicalis NOT DETECTED NOT DETECTED Final    Comment: Performed at [facility], [address], [place], [place] [address]      Radiology Studies: No results found.  Scheduled Meds: . ammonium lactate   Topical Daily  . calcitRIOL  1  mcg Oral Q M,W,F-HD  . Chlorhexidine Gluconate Cloth  6 each Topical Q0600  . Chlorhexidine Gluconate Cloth  6 each Topical Q0600  . heparin  5,000 Units Subcutaneous Q8H  . mouth rinse  15 mL Mouth Rinse BID  . metoprolol succinate  12.5 mg Oral Daily  . pantoprazole  40 mg Oral Daily  . triamcinolone cream   Topical Daily   Continuous Infusions: . cefTRIAXone (ROCEPHIN)  IV Stopped (03/06/18 2112)  . dextrose 10 % 30 mL/hr (03/06/18 1556)     LOS: 12 days   Time spent: 25 minutes.  Tyrone Nine, MD [facility] [email or web address] Password [email or web address] 03/07/2018, 1:20 PM

## 2018-03-08 DIAGNOSIS — N186 End stage renal disease: Secondary | ICD-10-CM | POA: Diagnosis not present

## 2018-03-08 DIAGNOSIS — J9601 Acute respiratory failure with hypoxia: Secondary | ICD-10-CM

## 2018-03-08 DIAGNOSIS — Z992 Dependence on renal dialysis: Secondary | ICD-10-CM | POA: Diagnosis not present

## 2018-03-08 DIAGNOSIS — Z7189 Other specified counseling: Secondary | ICD-10-CM

## 2018-03-08 DIAGNOSIS — E859 Amyloidosis, unspecified: Secondary | ICD-10-CM | POA: Diagnosis not present

## 2018-03-08 LAB — CULTURE, BLOOD (ROUTINE X 2)
CULTURE: NO GROWTH
Special Requests: ADEQUATE

## 2018-03-08 LAB — BASIC METABOLIC PANEL
ANION GAP: 14 (ref 5–15)
BUN: 19 mg/dL (ref 6–20)
CO2: 22 mmol/L (ref 22–32)
Calcium: 9.3 mg/dL (ref 8.9–10.3)
Chloride: 99 mmol/L — ABNORMAL LOW (ref 101–111)
Creatinine, Ser: 6.75 mg/dL — ABNORMAL HIGH (ref 0.61–1.24)
GFR calc Af Amer: 8 mL/min — ABNORMAL LOW (ref >60)
GFR, EST NON AFRICAN AMERICAN: 7 mL/min — AB (ref >60)
Glucose, Bld: 96 mg/dL (ref 65–99)
POTASSIUM: 4.6 mmol/L (ref 3.5–5.1)
SODIUM: 135 mmol/L (ref 135–145)

## 2018-03-08 LAB — GLUCOSE, CAPILLARY
GLUCOSE-CAPILLARY: 73 mg/dL (ref 65–99)
GLUCOSE-CAPILLARY: 112 mg/dL — AB (ref 65–99)
GLUCOSE-CAPILLARY: 108 mg/dL — AB (ref 65–99)
GLUCOSE-CAPILLARY: 127 mg/dL — AB (ref 65–99)

## 2018-03-08 MED ORDER — DEXTROSE 10 % IV SOLN
INTRAVENOUS | Status: DC
  Administered 2018-03-08 – 2018-03-10 (×3): via INTRAVENOUS

## 2018-03-08 NOTE — Progress Notes (Signed)
Daily Progress Note   Patient Name: Shane [name]       Date: 03/08/2018 DOB: Oct 29, 1940  Age: 77 y.o. MRN#: [id number] Attending Physician: Noralee Stain, DO Primary Care Physician: [name], MD Admit Date: 02/23/2018  Reason for Consultation/Follow-up: Establishing goals of care  Subjective: Patient more awake today. Tells me he is angry because he can't cover himself up due to his mitts. He cannot tell me where he is. He then asks me how much time he has left in dialysis. I clarify to him that he is in a hospital room at [facility] and not in dialysis.  I ask him about dialysis and he tells me he does not like going to dialysis. I asked him if he stops going to dialysis does he know what would happen to him and he states, "I would die". I then ask him what he thinks about dying and he states, "I'm not worried about it."  I attempt to engage him in deeper GOC discussion to include Hospice and comfort care, but he then closes his eyes and says goodnight.   ROS  Length of Stay: 13  Current Medications: Scheduled Meds:  . ammonium lactate   Topical Daily  . calcitRIOL  1 mcg Oral Q M,W,F-HD  . Chlorhexidine Gluconate Cloth  6 each Topical Q0600  . Chlorhexidine Gluconate Cloth  6 each Topical Q0600  . heparin  5,000 Units Subcutaneous Q8H  . mouth rinse  15 mL Mouth Rinse BID  . metoprolol succinate  12.5 mg Oral Daily  . pantoprazole  40 mg Oral Daily  . triamcinolone cream   Topical Daily    Continuous Infusions: . cefTRIAXone (ROCEPHIN)  IV Stopped (03/07/18 1854)  . dextrose 30 mL/hr at 03/08/18 1212    PRN Meds: acetaminophen **OR** acetaminophen, albuterol, bisacodyl, dextrose, diphenhydrAMINE, LORazepam, nicotine, ondansetron **OR** ondansetron (ZOFRAN) IV,  senna-docusate  Physical Exam  Constitutional:  cachetic  Cardiovascular: Normal rate and regular rhythm.  Pulmonary/Chest: Effort normal.  Neurological:  Awake, not oriented to place or time  Skin: Skin is warm and dry.  Nursing note and vitals reviewed.           Vital Signs: BP (!) 143/87 (BP Location: Right Wrist)   Pulse 84   Temp 98.8 F (37.1 C) (Axillary)   Resp Marland Kitchen)  23   Ht 5\' 5"  (1.651 m)   Wt 60.6 kg (133 lb 9.6 oz)   SpO2 96%   BMI 22.23 kg/m  SpO2: SpO2: 96 % O2 Device: O2 Device: Room Air O2 Flow Rate: O2 Flow Rate (L/min): 2 L/min  Intake/output summary:   Intake/Output Summary (Last 24 hours) at 03/08/2018 1519 Last data filed at 03/08/2018 1300 Gross per 24 hour  Intake 568 ml  Output -  Net 568 ml   LBM: Last BM Date: 03/07/18 Baseline Weight: Weight: (UTA, pt on ED stretcher, too unstable to stand) Most recent weight: Weight: 60.6 kg (133 lb 9.6 oz)       Palliative Assessment/Data: PPS: 20%   Flowsheet Rows     Most Recent Value  Intake Tab  Referral Department  Hospitalist  Unit at Time of Referral  Intermediate Care Unit  Palliative Care Primary Diagnosis  Nephrology  Date Notified  02/28/18  Palliative Care Type  New Palliative care  Reason for referral  Clarify Goals of Care, Psychosocial or Spiritual support  Date of Admission  02/23/18  Date first seen by Palliative Care  03/02/18  # of days Palliative referral response time  2 Day(s)  # of days IP prior to Palliative referral  5  Clinical Assessment  Palliative Performance Scale Score  30%  Pain Max last 24 hours  Not able to report  Pain Min Last 24 hours  Not able to report  Dyspnea Max Last 24 Hours  Not able to report  Dyspnea Min Last 24 hours  Not able to report  Nausea Max Last 24 Hours  Not able to report  Nausea Min Last 24 Hours  Not able to report  Anxiety Max Last 24 Hours  Not able to report  Anxiety Min Last 24 Hours  Not able to report  Other Max Last 24 Hours   Not able to report  Psychosocial & Spiritual Assessment  Palliative Care Outcomes  Patient/Family meeting held?  Yes  Who was at the meeting?  pt's wife and dtr, [facility]  Palliative Care Outcomes  Provided psychosocial or spiritual support      Patient Active Problem List   Diagnosis Date Noted  . Advance care planning   . HCAP (healthcare-associated pneumonia) 03/05/2018  . Streptococcal bacteremia 03/05/2018  . Hypoglycemia   . ESRD on dialysis (HCC)   . Palliative care encounter   . Palliative care by specialist   . SOB (shortness of breath)   . Hypoxia   . Acute delirium   . Dementia with behavioral disturbance   . GERD (gastroesophageal reflux disease) 01/25/2018  . Diarrhea 01/25/2018  . Abdominal pain 01/25/2018  . Acute respiratory failure with hypoxia (HCC) 01/25/2018  . Thrombocytopenia (HCC) 01/25/2018  . Atrial fibrillation, chronic (HCC)   . Atrial fibrillation (HCC) 06/01/2017  . A-fib (HCC) 06/01/2017  . Atrial fibrillation with RVR (HCC) 06/29/2016  . Adjustment disorder with other symptom   . Toxic metabolic encephalopathy 12/01/2015  . Goals of care, counseling/discussion 11/25/2015  . Bilateral inguinal hernia (BIH) s/p lap repair w mesh 11/25/2015 11/25/2015  . Hydrocele s/p partial rexction/unroofing 11/25/2015 11/25/2015  . 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 Pulmonary hypertension (HCC) 08/04/2015  . Essential hypertension   . Acute encephalopathy 04/12/2015  . ESRD needing dialysis (HCC) 04/12/2015  . Anorexia 04/12/2015  . Amyloidosis (HCC) 04/12/2015  . Uremia 04/12/2015  . Tobacco abuse 02/09/2013  . Hypertension 02/09/2013  Palliative Care Assessment & Plan   Patient Profile:  77 y.o. male  with past medical history of amyloid nephropathy on hemodialysis since 2016, vascular disease, vascular dementia, anemia, atrial fib with RVR, dysphasia admitted on 02/23/2018 with volume overload, respiratory  distress.  Patient had missed 2 dialysis appointments and presented short of breath.  He was dialyzed on 03/01/2018 while inpatient. Has developed strep bacteremia during admission.   Consult ordered for goals of care.   Assessment/Recommendations/Plan   Continue to hold sedating medications  Plan for f/u with family on Sunday  Goals of Care and Additional Recommendations:  Limitations on Scope of Treatment: Full Scope Treatment  Code Status:  Full code  Prognosis:   Unable to determine  Discharge Planning:  To Be Determined   Thank you for allowing the Palliative Medicine Team to assist in the care of this patient.   Time In: 1500 Time Out: 1525 Total Time 25 mins Prolonged Time Billed no      Greater than 50%  of this time was spent counseling and coordinating care related to the above assessment and plan.  Ocie BobKasie Mahan, AGNP-C Palliative Medicine   Please contact Palliative Medicine Team phone at [phone] for questions and concerns.

## 2018-03-08 NOTE — Progress Notes (Signed)
Occupational Therapy Treatment Patient Details Name: Shane [name] MRN: [id number] DOB: 04-Jan-1941 Today's Date: 03/08/2018    History of present illness The patient is a 77 y.o. year-old with hx AL amyloid, ESRD on HD, atrial fib who presents to ED with sob and resp distress.  Was on bipap briefly in ed but didn't tolerate well. Pt missed WEd and Fri Dialysis treatments last week and had been leaving early in treatments prior to that.     OT comments  Pt attempting to get OOB without asssit upon OT arrival. Pt initially stated that he wanted to go to the bathroom and then stated that he didn't and just wanted to sit up. Pt limited by cognitive impairments. Attempted x 3 to stand, however pt not attempting to assist or seeming to understand instructions verbally or physical prompts/cues to stand. Pt did. however sit EOB x 15 minutes and participated I simple grooming and UB ADLs with multimodal cues to initiate. Pt fixated on sandwich he'd be getting for lunch. OT will continue to follow acutely  Follow Up Recommendations  SNF;Supervision/Assistance - 24 hour    Equipment Recommendations   TBD at next venue of care   Recommendations for Other Services      Precautions / Restrictions Precautions Precautions: Fall Restrictions Weight Bearing Restrictions: No       Mobility Bed Mobility Overal bed mobility: Needs Assistance Bed Mobility: Supine to Sit;Sit to Supine     Supine to sit: Total assist Sit to supine: Total assist      Transfers                 General transfer comment: Attempted x 3 to stand, however pt not attempting to assist or seeming to understand instructions verbally or physical prompts/cues to stand    Balance Overall balance assessment: Needs assistance Sitting-balance support: Bilateral upper extremity supported;Feet supported Sitting balance-Leahy Scale: Poor Sitting balance - Comments: Sat EOB x 15 minutes with min guard A for  balance/support       Standing balance comment: Attempted x 3 to stand, however pt not attempting to assist or seeming to understand instructions verbally or physical prompts/cues to stand                           ADL either performed or assessed with clinical judgement   ADL Overall ADL's : Needs assistance/impaired     Grooming: Wash/dry face;Maximal assistance;Wash/dry hands;Sitting;Bed level Grooming Details (indicate cue type and reason): required max multimodal cues to initiate, unable to reach for washcloth wihout physical assist Upper Body Bathing: Maximal assistance;Bed level;Sitting Upper Body Bathing Details (indicate cue type and reason): multimodal cues required to initiate     Upper Body Dressing : Maximal assistance;Bed level;Sitting Upper Body Dressing Details (indicate cue type and reason): multimodal cues required to initiate       Toilet Transfer Details (indicate cue type and reason): unable, Attempted x 3 to stand, however pt not attempting to assist or seeming to understand instructions verbally or physical prompts/cues to stand           General ADL Comments: pt alert, however confused and impulsive. Pt fixated on what type of sandwich he wa going to be delivered for lunch      Vision Baseline Vision/History: (unceratin at this time due to pt's cognitive impairments)     Perception     Praxis      Cognition Arousal/Alertness: Awake/alert Behavior During  Therapy: Impulsive Overall Cognitive Status: Impaired/Different from baseline Area of Impairment: Problem solving;Safety/judgement;Orientation;Following commands                 Orientation Level: Disoriented to;Place;Time;Situation;Person       Safety/Judgement: Decreased awareness of safety;Decreased awareness of deficits   Problem Solving: Slow processing;Decreased initiation;Difficulty sequencing;Requires verbal cues;Requires tactile cues General Comments: pt required  redirection and multimodal cues to attend to simple ADL tasks. Pt attempting to get to EOB without asssit upon arrival stating that he needed to go to the bathroom, howevr when OT initiated assist, pt stated that he didn't need to use the bathroom but just wanted to sit up        Exercises     Shoulder Instructions       General Comments      Pertinent Vitals/ Pain       Pain Assessment: No/denies pain Pain Location: with bed mobility to sit EOB Pain Descriptors / Indicators: Grimacing Pain Intervention(s): Monitored during session  Home Living                                          Prior Functioning/Environment              Frequency  Min 2X/week        Progress Toward Goals  OT Goals(current goals can now be found in the care plan section)  Progress towards OT goals: Not progressing toward goals - comment(pt's cognition limiting his function)     Plan Discharge plan remains appropriate    Co-evaluation                 AM-PAC PT "6 Clicks" Daily Activity     Outcome Measure   Help from another person eating meals?: Total Help from another person taking care of personal grooming?: A Lot Help from another person toileting, which includes using toliet, bedpan, or urinal?: Total Help from another person bathing (including washing, rinsing, drying)?: Total Help from another person to put on and taking off regular upper body clothing?: A Lot Help from another person to put on and taking off regular lower body clothing?: Total 6 Click Score: 8    End of Session Equipment Utilized During Treatment: Gait belt  OT Visit Diagnosis: Unsteadiness on feet (R26.81);Other abnormalities of gait and mobility (R26.89);Muscle weakness (generalized) (M62.81);Other symptoms and signs involving cognitive function   Activity Tolerance Other (comment)(confusion)   Patient Left in bed;with call bell/phone within reach;with bed alarm set;with restraints  reapplied   Nurse Communication      Functional Assessment Tool Used: AM-PAC 6 Clicks Daily Activity   Time: 8119-14781055-1121 OT Time Calculation (min): 26 min  Charges: OT G-codes **NOT FOR INPATIENT CLASS** Functional Assessment Tool Used: AM-PAC 6 Clicks Daily Activity OT General Charges $OT Visit: 1 Visit OT Treatments $Self Care/Home Management : 8-22 mins $Therapeutic Activity: 8-22 mins     Shane ManilaSpencer, Shane [name] 03/08/2018, 1:49 PM

## 2018-03-08 NOTE — Progress Notes (Signed)
PROGRESS NOTE    Shane [name]  ZOX:[id number] DOB: July 27, 1941 DOA: 02/23/2018 PCP: [name], MD     Brief Narrative:  Shane [name] is a 77 y.o. 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 He has been nonadherent to hemodialysis as an outpatient and very frequently signed off early. Unfortunately now suffering from agitated delirium, decreased po intake, and subsequent hypoglycemic episodes. Work up including MRI brain showed no reversible or organic causes for encephalopathy. Palliative care has been consulted for goals of care and end-of-life discussions.  New events last 24 hours / Subjective: Patient awake and this morning.  He is oriented to [facility] but not to year or situation.  He states that he is here because of diarrhea.  When discussing hemodialysis, he states that 4 hours is too long and that is why he signs off early.  His nurse tech at bedside states that patient's mentation has been waxing and waning over the past few days.  Earlier today prior to my examination, patient was talking about baseball games and acting confused.   Assessment & Plan:   Active Problems:   Hypertension   ESRD needing dialysis (HCC)   Goals of care, counseling/discussion   GERD (gastroesophageal reflux disease)   Acute respiratory failure with hypoxia (HCC)   SOB (shortness of breath)   Hypoxia   Acute delirium   Dementia with behavioral disturbance   Palliative care by specialist   HCAP (healthcare-associated pneumonia)   Streptococcal bacteremia   Hypoglycemia   ESRD on dialysis [facility])   Palliative care encounter   Advance care planning   Acute encephalopathy on chronic dementia: Likely element of acute delirium, possibly worsened by pneumonia, hypoglycemia, respiratory distress on  decreased cerebral reserve. Ammonia, B12, TSH, cortisol wnl.  - Continue to optimize medical management of comorbidities.  - Delirium precautions.  - Minimize sedating medications.   Hypoglycemia: Recurrent, due to decreased po intake.  - Monitor CBG q4h, D50 push prn. Currently on D10. Hoping to avoid excess volume with ESRD/pulmonary edema at admission.  - Blood sugar 96 this morning   Dysphagia: Unclear precipitant with no stroke on imaging, possibly related to mental status changes.  - Appreciate serial SLP evaluations, currently on dysphagia 1 diet, nectar-thickened liquids.   ESRD:  - Nephrology following   LLL pneumonia: Fever on 5/26, no leukocytosis. Started vanc/unasyn, converted to zosyn.  - Continue antibiotics, transition to ceftriaxone for 7 days Tx (5/26 - 6/1) followed by keflex (as below for possible bacteremia) x7 days.  S. viridans in 1 of 4 blood cultures: More consistent with contaminant, but susceptible to cephalosporins as above.  - Continue CTX to complete 7 days IV Tx, then 7 days po keflex - Repeat blood culture 5/29 negative to date   Chronic AFib: Rate-controlled - Not on anticoagulation due to nonadherence  HTN: Well-controlled.  - Continue medications  COPD: No wheezing.  - Prn nebs   DVT prophylaxis: Subq hep Code Status: Full Family Communication: No family at bedside Disposition Plan: Pending ongoing palliative care discussions   Consultants:   Nephrology  Palliative care  Procedures:   None   Antimicrobials:  Anti-infectives (From admission, onward)   Start     Dose/Rate Route Frequency Ordered Stop   03/06/18 1900  cefTRIAXone (ROCEPHIN) 2 g  in sodium chloride 0.9 % 100 mL IVPB     2 g 200 mL/hr over 30 Minutes Intravenous Every 24 hours 03/06/18 1855 03/09/18 2359   03/04/18 1200  vancomycin (VANCOCIN) 500 mg in sodium chloride 0.9 % 100 mL IVPB  Status:  Discontinued     500 mg 100 mL/hr over 60 Minutes Intravenous  Every M-W-F (Hemodialysis) 03/03/18 1634 03/05/18 1441   03/04/18 1019  vancomycin (VANCOCIN) 500-5 MG/100ML-% IVPB    Note to Pharmacy:  Shane [name]   : cabinet override      03/04/18 1019 03/04/18 1040   03/03/18 2200  piperacillin-tazobactam (ZOSYN) IVPB 3.375 g  Status:  Discontinued     3.375 g 12.5 mL/hr over 240 Minutes Intravenous Every 12 hours 03/03/18 1634 03/06/18 1855   03/03/18 1700  vancomycin (VANCOCIN) 500 mg in sodium chloride 0.9 % 100 mL IVPB     500 mg 100 mL/hr over 60 Minutes Intravenous  Once 03/03/18 1634 03/03/18 2049   03/03/18 1200  Ampicillin-Sulbactam (UNASYN) 3 g in sodium chloride 0.9 % 100 mL IVPB  Status:  Discontinued     3 g 200 mL/hr over 30 Minutes Intravenous 2 times daily 03/03/18 1102 03/03/18 1613       Objective: Vitals:   03/07/18 0717 03/07/18 1440 03/07/18 1721 03/08/18 0437  BP: 104/69 125/74 110/64 (!) 143/87  Pulse: 66 84    Resp: 16  (!) 23   Temp: 98.4 F (36.9 C)  98.2 F (36.8 C) 98.8 F (37.1 C)  TempSrc: Oral  Axillary Axillary  SpO2: 100% 96%    Weight:    60.6 kg (133 lb 9.6 oz)  Height:        Intake/Output Summary (Last 24 hours) at 03/08/2018 1356 Last data filed at 03/08/2018 1300 Gross per 24 hour  Intake 568 ml  Output -  Net 568 ml   Filed Weights   03/06/18 1219 03/07/18 0346 03/08/18 0437  Weight: 60 kg (132 lb 4.4 oz) 60.4 kg (133 lb 2.5 oz) 60.6 kg (133 lb 9.6 oz)    Examination:  General exam: Appears calm and comfortable  Respiratory system: Clear to auscultation. Respiratory effort normal. Cardiovascular system: S1 & S2 heard, Irreg rhythm rate 90s. No JVD, murmurs, rubs, gallops or clicks. No pedal edema. Gastrointestinal system: Abdomen is nondistended, soft and nontender. No organomegaly or masses felt. Normal bowel sounds heard. Central nervous system: Alert and oriented to place only  Extremities: Symmetric 5 x 5 power. Skin: No rashes, lesions or ulcers Psychiatry: +Dementia with  delirium   Data Reviewed: I have personally reviewed following labs and imaging studies  CBC: Recent Labs  Lab 03/02/18 0923 03/04/18 0805 03/06/18 0846  WBC 6.1 7.2 5.8  HGB 10.4* 9.5* 9.7*  HCT 32.0* 29.5* 30.4*  MCV 99.1 100.0 99.3  PLT 147* 133* 178   Basic Metabolic Panel: Recent Labs  Lab 03/02/18 0923 03/04/18 0804 03/06/18 0846 03/08/18 0911  NA 135 132* 133* 135  K 4.0 5.1 4.4 4.6  CL 94* 92* 94* 99*  CO2 31 29 28 22   GLUCOSE 89 94 84 96  BUN 9 30* 21* 19  CREATININE 4.90* 8.54* 6.74* 6.75*  CALCIUM 8.9 9.5 9.4 9.3  PHOS  --  3.4 2.5  --    GFR: Estimated Creatinine Clearance: 8 mL/min (A) (by C-G formula based on SCr of 6.75 mg/dL (H)). Liver Function Tests: Recent Labs  Lab 03/04/18 0804 03/06/18 0846  ALBUMIN 2.8* 2.7*  No results for input(s): LIPASE, AMYLASE in the last 168 hours. No results for input(s): AMMONIA in the last 168 hours. Coagulation Profile: No results for input(s): INR, PROTIME in the last 168 hours. Cardiac Enzymes: No results for input(s): CKTOTAL, CKMB, CKMBINDEX, TROPONINI in the last 168 hours. BNP (last 3 results) No results for input(s): PROBNP in the last 8760 hours. HbA1C: No results for input(s): HGBA1C in the last 72 hours. CBG: Recent Labs  Lab 03/07/18 1157 03/07/18 1633 03/07/18 2331 03/08/18 0420 03/08/18 1224  GLUCAP 98 92 93 73 112*   Lipid Profile: No results for input(s): CHOL, HDL, LDLCALC, TRIG, CHOLHDL, LDLDIRECT in the last 72 hours. Thyroid Function Tests: Recent Labs    03/06/18 2000  TSH 1.143   Anemia Panel: No results for input(s): VITAMINB12, FOLATE, FERRITIN, TIBC, IRON, RETICCTPCT in the last 72 hours. Sepsis Labs: No results for input(s): PROCALCITON, LATICACIDVEN in the last 168 hours.  Recent Results (from the past 240 hour(s))  Culture, blood (routine x 2)     Status: Abnormal   Collection Time: 03/03/18 10:29 AM  Result Value Ref Range Status   Specimen Description BLOOD  SITE NOT SPECIFIED  Final   Special Requests   Final    BOTTLES DRAWN AEROBIC ONLY Blood Culture adequate volume   Culture  Setup Time   Final    GRAM POSITIVE COCCI IN CHAINS AEROBIC BOTTLE ONLY CRITICAL RESULT CALLED TO, READ BACK BY AND VERIFIED WITH: C ROBERTSON,PHARMD AT 1414 03/04/18 BY L BENFIELD Performed at [facility], [address] [place], [place] [address]    Culture VIRIDANS STREPTOCOCCUS (A)  Final   Report Status 03/06/2018 FINAL  Final   Organism ID, Bacteria VIRIDANS STREPTOCOCCUS  Final      Susceptibility   Viridans streptococcus - MIC*    PENICILLIN 0.5 INTERMEDIATE Intermediate     CEFTRIAXONE 0.25 SENSITIVE Sensitive     ERYTHROMYCIN 2 RESISTANT Resistant     LEVOFLOXACIN 1 SENSITIVE Sensitive     VANCOMYCIN 0.25 SENSITIVE Sensitive     * VIRIDANS STREPTOCOCCUS  Culture, blood (routine x 2)     Status: None   Collection Time: 03/03/18 10:29 AM  Result Value Ref Range Status   Specimen Description BLOOD SITE NOT SPECIFIED  Final   Special Requests   Final    BOTTLES DRAWN AEROBIC ONLY Blood Culture adequate volume   Culture   Final    NO GROWTH 5 DAYS Performed at [facility], [address], [place], [place] [address]    Report Status 03/08/2018 FINAL  Final  Blood Culture ID Panel (Reflexed)     Status: Abnormal   Collection Time: 03/03/18 10:29 AM  Result Value Ref Range Status   Enterococcus species NOT DETECTED NOT DETECTED Final   Listeria monocytogenes NOT DETECTED NOT DETECTED Final   Staphylococcus species NOT DETECTED NOT DETECTED Final   Staphylococcus aureus NOT DETECTED NOT DETECTED Final   Streptococcus species DETECTED (A) NOT DETECTED Final    Comment: Not Enterococcus species, Streptococcus agalactiae, Streptococcus pyogenes, or Streptococcus pneumoniae. CRITICAL RESULT CALLED TO, READ BACK BY AND VERIFIED WITH: C ROBERTSON,PHARMD AT 1414 03/04/18 BY L BENFIELD    Streptococcus agalactiae NOT DETECTED NOT DETECTED Final     Streptococcus pneumoniae NOT DETECTED NOT DETECTED Final   Streptococcus pyogenes NOT DETECTED NOT DETECTED Final   Acinetobacter baumannii NOT DETECTED NOT DETECTED Final   Enterobacteriaceae species NOT DETECTED NOT DETECTED Final   Enterobacter cloacae complex  NOT DETECTED NOT DETECTED Final   Escherichia coli NOT DETECTED NOT DETECTED Final   Klebsiella oxytoca NOT DETECTED NOT DETECTED Final   Klebsiella pneumoniae NOT DETECTED NOT DETECTED Final   Proteus species NOT DETECTED NOT DETECTED Final   Serratia marcescens NOT DETECTED NOT DETECTED Final   Haemophilus influenzae NOT DETECTED NOT DETECTED Final   Neisseria meningitidis NOT DETECTED NOT DETECTED Final   Pseudomonas aeruginosa NOT DETECTED NOT DETECTED Final   Candida albicans NOT DETECTED NOT DETECTED Final   Candida glabrata NOT DETECTED NOT DETECTED Final   Candida krusei NOT DETECTED NOT DETECTED Final   Candida parapsilosis NOT DETECTED NOT DETECTED Final   Candida tropicalis NOT DETECTED NOT DETECTED Final    Comment: Performed at [facility], [address] [place], [place] [address]  Culture, blood (routine x 2)     Status: None (Preliminary result)   Collection Time: 03/06/18  8:00 PM  Result Value Ref Range Status   Specimen Description BLOOD RIGHT HAND  Final   Special Requests   Final    BOTTLES DRAWN AEROBIC AND ANAEROBIC Blood Culture results may not be optimal due to an inadequate volume of blood received in culture bottles   Culture   Final    NO GROWTH 2 DAYS Performed at [facility], [address] [place], [place] [address]    Report Status PENDING  Incomplete  Culture, blood (routine x 2)     Status: None (Preliminary result)   Collection Time: 03/06/18  8:00 PM  Result Value Ref Range Status   Specimen Description BLOOD RIGHT HAND  Final   Special Requests   Final    BOTTLES DRAWN AEROBIC ONLY Blood Culture adequate volume   Culture   Final    NO GROWTH 2 DAYS Performed at  [facility], [address] [place], [place] [address]    Report Status PENDING  Incomplete       Radiology Studies: No results found.    Scheduled Meds: . ammonium lactate   Topical Daily  . calcitRIOL  1 mcg Oral Q M,W,F-HD  . Chlorhexidine Gluconate Cloth  6 each Topical Q0600  . Chlorhexidine Gluconate Cloth  6 each Topical Q0600  . heparin  5,000 Units Subcutaneous Q8H  . mouth rinse  15 mL Mouth Rinse BID  . metoprolol succinate  12.5 mg Oral Daily  . pantoprazole  40 mg Oral Daily  . triamcinolone cream   Topical Daily   Continuous Infusions: . cefTRIAXone (ROCEPHIN)  IV Stopped (03/07/18 1854)  . dextrose 30 mL/hr at 03/08/18 1212     LOS: 13 days    Time spent: 40 minutes   Noralee StainJennifer Lavell Supple, DO [facility] [email or web address] Password [email or web address] 03/08/2018, 1:56 PM

## 2018-03-08 NOTE — Progress Notes (Signed)
[facility] Progress Note  Subjective: no c/o's more awake not getting any haldol now  Vitals:   03/07/18 0717 03/07/18 1440 03/07/18 1721 03/08/18 0437  BP: 104/69 125/74 110/64 (!) 143/87  Pulse: 66 84    Resp: 16  (!) 23   Temp: 98.4 F (36.9 C)  98.2 F (36.8 C) 98.8 F (37.1 C)  TempSrc: Oral  Axillary Axillary  SpO2: 100% 96%    Weight:    60.6 kg (133 lb 9.6 oz)  Height:        Inpatient medications: . ammonium lactate   Topical Daily  . calcitRIOL  1 mcg Oral Q M,W,F-HD  . Chlorhexidine Gluconate Cloth  6 each Topical Q0600  . Chlorhexidine Gluconate Cloth  6 each Topical Q0600  . heparin  5,000 Units Subcutaneous Q8H  . mouth rinse  15 mL Mouth Rinse BID  . metoprolol succinate  12.5 mg Oral Daily  . pantoprazole  40 mg Oral Daily  . triamcinolone cream   Topical Daily   . cefTRIAXone (ROCEPHIN)  IV Stopped (03/07/18 1854)  . dextrose 30 mL/hr at 03/08/18 1212   acetaminophen **OR** acetaminophen, albuterol, bisacodyl, dextrose, diphenhydrAMINE, LORazepam, nicotine, ondansetron **OR** ondansetron (ZOFRAN) IV, senna-docusate  Exam:  in bed w/ mittens on and restraints, partially oriented  no jvd  chest cta bilat  cor reg no mrg  abd soft ntnd scaphoid  ext no sig leg edema  left ua avf bruit +  cxr 5/26 - retrocardiac density on left  Dialysis: [facility]   4h  65kg  2/2.25 bath left arm avf  Heparin none      Impression: 1 altered mental status - holding sedation, prob dementia 2 esrd on hd - poor compliance w op dialysis signs off early prob dementia contributing. Holding hd for now while pall care conversations ongoing.  3 volume is stable under dry wt 4 anemia ckd hb 9- 10  here 5 hypertension on metoprolol xl only 6 fevers/ supsected PNA LLL -  on IV abx 7 strep bacteremia - viridans strep in 1 bottle felt to be prob contaminant 7 eol - palliative care following  Plan - holding hd for now  Shane Moselle MD [facility] pager [phone]   03/08/2018, 2:05 PM   Recent Labs  Lab 03/04/18 0804 03/06/18 0846 03/08/18 0911  NA 132* 133* 135  K 5.1 4.4 4.6  CL 92* 94* 99*  CO2 GLUCOSE 94 84 96  BUN 30* 21* 19  CREATININE 8.54* 6.74* 6.75*  CALCIUM 9.5 9.4 9.3  PHOS 3.4 2.5  --    Recent Labs  Lab 03/04/18 0804 03/06/18 0846  ALBUMIN 2.8* 2.7*   Recent Labs  Lab 03/02/18 0923 03/04/18 0805 03/06/18 0846  WBC 6.1 7.2 5.8  HGB 10.4* 9.5* 9.7*  HCT 32.0* 29.5* 30.4*  MCV 99.1 100.0 99.3  PLT 147* 133* 178   Iron/TIBC/Ferritin/ %Sat No results found for: IRON, TIBC, FERRITIN, IRONPCTSAT

## 2018-03-09 ENCOUNTER — Encounter (HOSPITAL_COMMUNITY): Payer: Self-pay | Admitting: Family

## 2018-03-09 ENCOUNTER — Other Ambulatory Visit: Payer: Self-pay

## 2018-03-09 DIAGNOSIS — R410 Disorientation, unspecified: Secondary | ICD-10-CM

## 2018-03-09 LAB — GLUCOSE, CAPILLARY
GLUCOSE-CAPILLARY: 77 mg/dL (ref 65–99)
GLUCOSE-CAPILLARY: 92 mg/dL (ref 65–99)
GLUCOSE-CAPILLARY: 87 mg/dL (ref 65–99)
GLUCOSE-CAPILLARY: 100 mg/dL — AB (ref 65–99)
GLUCOSE-CAPILLARY: 80 mg/dL (ref 65–99)
Glucose-Capillary: 101 mg/dL — ABNORMAL HIGH (ref 65–99)
Glucose-Capillary: 64 mg/dL — ABNORMAL LOW (ref 65–99)
Glucose-Capillary: 88 mg/dL (ref 65–99)
Glucose-Capillary: 89 mg/dL (ref 65–99)

## 2018-03-09 MED ORDER — DEXTROSE 50 % IV SOLN
INTRAVENOUS | Status: AC
  Administered 2018-03-09: 25 mL
  Filled 2018-03-09: qty 50

## 2018-03-09 NOTE — Progress Notes (Signed)
Patient oldest daughter visiting. She spoke to me about not knowing the depth of her father's medical condition. She said her youngest sister was the caregiver for her father and mother. The mother is wheelchair bound with medical issues and the caregiver has down syndrome. The caregiver may need help or assistance with their care. Social work consult.

## 2018-03-09 NOTE — Plan of Care (Signed)
  Problem: Safety: Goal: Ability to remain free from injury will improve Outcome: Progressing   Problem: Skin Integrity: Goal: Risk for impaired skin integrity will decrease Outcome: Progressing   Problem: Metabolic: Goal: Ability to maintain appropriate glucose levels will improve Outcome: Progressing   

## 2018-03-09 NOTE — Progress Notes (Signed)
PROGRESS NOTE    Shane [name]  WGN:[id number] DOB: 26-Mar-1941 DOA: 02/23/2018 PCP: [name], MD     Brief Narrative:  Shane [name] is a 77 y.o. 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 He has been nonadherent to hemodialysis as an outpatient and very frequently signed off early. Unfortunately now suffering from agitated delirium, decreased po intake, and subsequent hypoglycemic episodes. Work up including MRI brain showed no reversible or organic causes for encephalopathy. Palliative care has been consulted for goals of care and end-of-life discussions.  New events last 24 hours / Subjective: Patient is in bed, not alert but seems agitated. Mittens and remote video sitter in room.   Assessment & Plan:   Active Problems:   Hypertension   ESRD needing dialysis (HCC)   Goals of care, counseling/discussion   GERD (gastroesophageal reflux disease)   Acute respiratory failure with hypoxia (HCC)   SOB (shortness of breath)   Hypoxia   Acute delirium   Dementia with behavioral disturbance   Palliative care by specialist   HCAP (healthcare-associated pneumonia)   Streptococcal bacteremia   Hypoglycemia   ESRD on dialysis [facility])   Palliative care encounter   Advance care planning   Acute encephalopathy on chronic dementia: Likely element of acute delirium, possibly worsened by pneumonia, hypoglycemia, respiratory distress on decreased cerebral reserve. Ammonia, B12, TSH, cortisol wnl.  - Continue to optimize medical management of comorbidities.  - Delirium precautions.  - Minimize sedating medications.   Hypoglycemia: Recurrent, due to decreased po intake.  - Monitor CBG q4h, D50 push prn. Currently on D10. Hoping to avoid excess volume with ESRD/pulmonary edema at admission.  -  Blood sugar 77 this morning   Dysphagia: Unclear precipitant with no stroke on imaging, possibly related to mental status changes.  - Appreciate serial SLP evaluations, currently on dysphagia 1 diet, nectar-thickened liquids.   ESRD:  - Nephrology following   LLL pneumonia: Fever on 5/26, no leukocytosis. Started vanc/unasyn, converted to zosyn.  - Continue antibiotics, transition to ceftriaxone for 7 days Tx (5/26 - 6/1) followed by keflex (as below for possible bacteremia) x7 days.  S. viridans in 1 of 4 blood cultures: More consistent with contaminant, but susceptible to cephalosporins as above.  - Continue CTX to complete 7 days IV Tx, then 7 days po keflex - Repeat blood culture 5/29 negative to date   Chronic AFib: Rate-controlled - Not on anticoagulation due to nonadherence  HTN: Well-controlled.  - Continue medications  COPD: No wheezing.  - Prn nebs   DVT prophylaxis: Subq hep Code Status: Full Family Communication: No family at bedside Disposition Plan: Pending ongoing palliative care discussions, note for family meeting planned tomorrow. Patient's mentation continues to wax and wane. Not clear that he is a good candidate for dialysis going forward if his mentation does not stabilize.    Consultants:   Nephrology  Palliative care  Procedures:   None   Antimicrobials:  Anti-infectives (From admission, onward)   Start     Dose/Rate Route Frequency Ordered Stop   03/06/18 1900  cefTRIAXone (ROCEPHIN) 2 g in sodium chloride 0.9 % 100 mL IVPB     2 g 200 mL/hr over 30 Minutes Intravenous Every 24 hours 03/06/18 1855 03/09/18 2359   03/04/18 1200  vancomycin (VANCOCIN) 500 mg in  sodium chloride 0.9 % 100 mL IVPB  Status:  Discontinued     500 mg 100 mL/hr over 60 Minutes Intravenous Every M-W-F (Hemodialysis) 03/03/18 1634 03/05/18 1441   03/04/18 1019  vancomycin (VANCOCIN) 500-5 MG/100ML-% IVPB    Note to Pharmacy:  Davy PiqueMitchell, Adrian   : cabinet override       03/04/18 1019 03/04/18 1040   03/03/18 2200  piperacillin-tazobactam (ZOSYN) IVPB 3.375 g  Status:  Discontinued     3.375 g 12.5 mL/hr over 240 Minutes Intravenous Every 12 hours 03/03/18 1634 03/06/18 1855   03/03/18 1700  vancomycin (VANCOCIN) 500 mg in sodium chloride 0.9 % 100 mL IVPB     500 mg 100 mL/hr over 60 Minutes Intravenous  Once 03/03/18 1634 03/03/18 2049   03/03/18 1200  Ampicillin-Sulbactam (UNASYN) 3 g in sodium chloride 0.9 % 100 mL IVPB  Status:  Discontinued     3 g 200 mL/hr over 30 Minutes Intravenous 2 times daily 03/03/18 1102 03/03/18 1613       Objective: Vitals:   03/08/18 0437 03/08/18 2205 03/09/18 0448 03/09/18 0609  BP: (!) 143/87 (!) 121/94  (!) 122/95  Pulse:  94  95  Resp:  19  19  Temp: 98.8 F (37.1 C) 99.2 F (37.3 C)  99 F (37.2 C)  TempSrc: Axillary Axillary  Axillary  SpO2:  93%  98%  Weight: 60.6 kg (133 lb 9.6 oz)  61.7 kg (136 lb 0.4 oz)   Height:        Intake/Output Summary (Last 24 hours) at 03/09/2018 0949 Last data filed at 03/09/2018 0600 Gross per 24 hour  Intake 1152 ml  Output -  Net 1152 ml   Filed Weights   03/07/18 0346 03/08/18 0437 03/09/18 0448  Weight: 60.4 kg (133 lb 2.5 oz) 60.6 kg (133 lb 9.6 oz) 61.7 kg (136 lb 0.4 oz)    Examination: General exam: Appears agitated in bed  Respiratory system: Clear to auscultation. Respiratory effort normal. Cardiovascular system: S1 & S2 heard, Irreg rhythm rate 80-90s. No JVD, murmurs, rubs, gallops or clicks. No pedal edema. Gastrointestinal system: Abdomen is nondistended, soft and nontender. No organomegaly or masses felt. Normal bowel sounds heard. Central nervous system: Unable to test as patient not interactive or engaged today  Extremities: Symmetric  Skin: No rashes, lesions or ulcers Psychiatry: Dementia with delirium    Data Reviewed: I have personally reviewed following labs and imaging studies  CBC: Recent Labs  Lab 03/04/18 0805 03/06/18 0846    WBC 7.2 5.8  HGB 9.5* 9.7*  HCT 29.5* 30.4*  MCV 100.0 99.3  PLT 133* 178   Basic Metabolic Panel: Recent Labs  Lab 03/04/18 0804 03/06/18 0846 03/08/18 0911  NA 132* 133* 135  K 5.1 4.4 4.6  CL 92* 94* 99*  CO2 29 28 22   GLUCOSE 94 84 96  BUN 30* 21* 19  CREATININE 8.54* 6.74* 6.75*  CALCIUM 9.5 9.4 9.3  PHOS 3.4 2.5  --    GFR: Estimated Creatinine Clearance: 8.1 mL/min (A) (by C-G formula based on SCr of 6.75 mg/dL (H)). Liver Function Tests: Recent Labs  Lab 03/04/18 0804 03/06/18 0846  ALBUMIN 2.8* 2.7*   No results for input(s): LIPASE, AMYLASE in the last 168 hours. No results for input(s): AMMONIA in the last 168 hours. Coagulation Profile: No results for input(s): INR, PROTIME in the last 168 hours. Cardiac Enzymes: No results for input(s): CKTOTAL, CKMB, CKMBINDEX, TROPONINI in the last 168 hours.  BNP (last 3 results) No results for input(s): PROBNP in the last 8760 hours. HbA1C: No results for input(s): HGBA1C in the last 72 hours. CBG: Recent Labs  Lab 03/08/18 2221 03/09/18 0010 03/09/18 0422 03/09/18 0441 03/09/18 0806  GLUCAP 127* 101* 64* 89 77   Lipid Profile: No results for input(s): CHOL, HDL, LDLCALC, TRIG, CHOLHDL, LDLDIRECT in the last 72 hours. Thyroid Function Tests: Recent Labs    03/06/18 2000  TSH 1.143   Anemia Panel: No results for input(s): VITAMINB12, FOLATE, FERRITIN, TIBC, IRON, RETICCTPCT in the last 72 hours. Sepsis Labs: No results for input(s): PROCALCITON, LATICACIDVEN in the last 168 hours.  Recent Results (from the past 240 hour(s))  Culture, blood (routine x 2)     Status: Abnormal   Collection Time: 03/03/18 10:29 AM  Result Value Ref Range Status   Specimen Description BLOOD SITE NOT SPECIFIED  Final   Special Requests   Final    BOTTLES DRAWN AEROBIC ONLY Blood Culture adequate volume   Culture  Setup Time   Final    GRAM POSITIVE COCCI IN CHAINS AEROBIC BOTTLE ONLY CRITICAL RESULT CALLED TO, READ  BACK BY AND VERIFIED WITH: C ROBERTSON,PHARMD AT 1414 03/04/18 BY L BENFIELD Performed at [facility], [address] [place], [place] [address]    Culture VIRIDANS STREPTOCOCCUS (A)  Final   Report Status 03/06/2018 FINAL  Final   Organism ID, Bacteria VIRIDANS STREPTOCOCCUS  Final      Susceptibility   Viridans streptococcus - MIC*    PENICILLIN 0.5 INTERMEDIATE Intermediate     CEFTRIAXONE 0.25 SENSITIVE Sensitive     ERYTHROMYCIN 2 RESISTANT Resistant     LEVOFLOXACIN 1 SENSITIVE Sensitive     VANCOMYCIN 0.25 SENSITIVE Sensitive     * VIRIDANS STREPTOCOCCUS  Culture, blood (routine x 2)     Status: None   Collection Time: 03/03/18 10:29 AM  Result Value Ref Range Status   Specimen Description BLOOD SITE NOT SPECIFIED  Final   Special Requests   Final    BOTTLES DRAWN AEROBIC ONLY Blood Culture adequate volume   Culture   Final    NO GROWTH 5 DAYS Performed at [facility], [address], [place], [place] [address]    Report Status 03/08/2018 FINAL  Final  Blood Culture ID Panel (Reflexed)     Status: Abnormal   Collection Time: 03/03/18 10:29 AM  Result Value Ref Range Status   Enterococcus species NOT DETECTED NOT DETECTED Final   Listeria monocytogenes NOT DETECTED NOT DETECTED Final   Staphylococcus species NOT DETECTED NOT DETECTED Final   Staphylococcus aureus NOT DETECTED NOT DETECTED Final   Streptococcus species DETECTED (A) NOT DETECTED Final    Comment: Not Enterococcus species, Streptococcus agalactiae, Streptococcus pyogenes, or Streptococcus pneumoniae. CRITICAL RESULT CALLED TO, READ BACK BY AND VERIFIED WITH: C ROBERTSON,PHARMD AT 1414 03/04/18 BY L BENFIELD    Streptococcus agalactiae NOT DETECTED NOT DETECTED Final   Streptococcus pneumoniae NOT DETECTED NOT DETECTED Final   Streptococcus pyogenes NOT DETECTED NOT DETECTED Final   Acinetobacter baumannii NOT DETECTED NOT DETECTED Final   Enterobacteriaceae species NOT DETECTED NOT DETECTED  Final   Enterobacter cloacae complex NOT DETECTED NOT DETECTED Final   Escherichia coli NOT DETECTED NOT DETECTED Final   Klebsiella oxytoca NOT DETECTED NOT DETECTED Final   Klebsiella pneumoniae NOT DETECTED NOT DETECTED Final   Proteus species NOT DETECTED NOT DETECTED Final   Serratia marcescens NOT DETECTED NOT DETECTED Final  Haemophilus influenzae NOT DETECTED NOT DETECTED Final   Neisseria meningitidis NOT DETECTED NOT DETECTED Final   Pseudomonas aeruginosa NOT DETECTED NOT DETECTED Final   Candida albicans NOT DETECTED NOT DETECTED Final   Candida glabrata NOT DETECTED NOT DETECTED Final   Candida krusei NOT DETECTED NOT DETECTED Final   Candida parapsilosis NOT DETECTED NOT DETECTED Final   Candida tropicalis NOT DETECTED NOT DETECTED Final    Comment: Performed at [facility], [address], [place], [place] [address]  Culture, blood (routine x 2)     Status: None (Preliminary result)   Collection Time: 03/06/18  8:00 PM  Result Value Ref Range Status   Specimen Description BLOOD RIGHT HAND  Final   Special Requests   Final    BOTTLES DRAWN AEROBIC AND ANAEROBIC Blood Culture results may not be optimal due to an inadequate volume of blood received in culture bottles   Culture   Final    NO GROWTH 2 DAYS Performed at [facility], [address], [place], [place] [address]    Report Status PENDING  Incomplete  Culture, blood (routine x 2)     Status: None (Preliminary result)   Collection Time: 03/06/18  8:00 PM  Result Value Ref Range Status   Specimen Description BLOOD RIGHT HAND  Final   Special Requests   Final    BOTTLES DRAWN AEROBIC ONLY Blood Culture adequate volume   Culture   Final    NO GROWTH 2 DAYS Performed at [facility], [address] [place], [place] [address]    Report Status PENDING  Incomplete       Radiology Studies: No results found.    Scheduled Meds: . ammonium lactate   Topical Daily  . calcitRIOL  1 mcg Oral  Q M,W,F-HD  . Chlorhexidine Gluconate Cloth  6 each Topical Q0600  . Chlorhexidine Gluconate Cloth  6 each Topical Q0600  . heparin  5,000 Units Subcutaneous Q8H  . mouth rinse  15 mL Mouth Rinse BID  . metoprolol succinate  12.5 mg Oral Daily  . pantoprazole  40 mg Oral Daily  . triamcinolone cream   Topical Daily   Continuous Infusions: . cefTRIAXone (ROCEPHIN)  IV Stopped (03/08/18 2100)  . dextrose 30 mL/hr at 03/09/18 0708     LOS: 14 days    Time spent: 25 minutes   Noralee Stain, DO [facility] [email or web address] Password [facility] 03/09/2018, 9:49 AM

## 2018-03-09 NOTE — Progress Notes (Addendum)
[facility] Progress Note  Subjective: had to get IV ativan for agitation overnight and is now sedated, tried to hit the staff when approached about lab draw reportedly  Vitals:   03/08/18 0437 03/08/18 2205 03/09/18 0448 03/09/18 0609  BP: (!) 143/87 (!) 121/94  (!) 122/95  Pulse:  94  95  Resp:  19  19  Temp: 98.8 F (37.1 C) 99.2 F (37.3 C)  99 F (37.2 C)  TempSrc: Axillary Axillary  Axillary  SpO2:  93%  98%  Weight: 60.6 kg (133 lb 9.6 oz)  61.7 kg (136 lb 0.4 oz)   Height:        Inpatient medications: . ammonium lactate   Topical Daily  . calcitRIOL  1 mcg Oral Q M,W,F-HD  . Chlorhexidine Gluconate Cloth  6 each Topical Q0600  . Chlorhexidine Gluconate Cloth  6 each Topical Q0600  . heparin  5,000 Units Subcutaneous Q8H  . mouth rinse  15 mL Mouth Rinse BID  . metoprolol succinate  12.5 mg Oral Daily  . pantoprazole  40 mg Oral Daily  . triamcinolone cream   Topical Daily   . cefTRIAXone (ROCEPHIN)  IV Stopped (03/08/18 2100)  . dextrose 30 mL/hr at 03/09/18 [id number]   acetaminophen **OR** acetaminophen, albuterol, bisacodyl, dextrose, diphenhydrAMINE, nicotine, ondansetron **OR** ondansetron (ZOFRAN) IV, senna-docusate  Exam:  in bed w/ mittens on and restraints, sedated and confused  no jvd  chest cta bilat  cor reg no mrg  abd soft ntnd scaphoid  ext no sig leg edema  left ua avf bruit +  cxr 5/26 - retrocardiac density on left  Dialysis: [facility]   4h  65kg  2/2.25 bath left arm avf  Heparin none      Impression: 1 altered mental status - underlying dementia 2 esrd on hd - poor compliance w op dialysis. No hd this weekend.  3 volume is stable under dry wt 4 anemia ckd hb 9- 10  here 5 hypertension on metoprolol xl only 6 fevers/ supsected PNA LLL -  on IV abx 7 strep bacteremia - viridans strep in 1 bottle felt to be prob contaminant 7 eol - palliative care following they will meet again w/ family tomorrow, please try to avoid sedating  patient tonight pt needs to be awake tomorrow to meet with pall care and family  Plan - as above  Vinson Moselleob Daxter Paule MD [facility] pager [phone]   03/09/2018, 3:01 PM   Recent Labs  Lab 03/04/18 0804 03/06/18 0846 03/08/18 0911  NA 132* 133* 135  K 5.1 4.4 4.6  CL 92* 94* 99*  CO2 29 28 22   GLUCOSE 94 84 96  BUN 30* 21* 19  CREATININE 8.54* 6.74* 6.75*  CALCIUM 9.5 9.4 9.3  PHOS 3.4 2.5  --    Recent Labs  Lab 03/04/18 0804 03/06/18 0846  ALBUMIN 2.8* 2.7*   Recent Labs  Lab 03/04/18 0805 03/06/18 0846  WBC 7.2 5.8  HGB 9.5* 9.7*  HCT 29.5* 30.4*  MCV 100.0 99.3  PLT 133* 178   Iron/TIBC/Ferritin/ %Sat No results found for: IRON, TIBC, FERRITIN, IRONPCTSAT

## 2018-03-10 LAB — GLUCOSE, CAPILLARY
GLUCOSE-CAPILLARY: 90 mg/dL (ref 65–99)
GLUCOSE-CAPILLARY: 84 mg/dL (ref 65–99)
GLUCOSE-CAPILLARY: 91 mg/dL (ref 65–99)
GLUCOSE-CAPILLARY: 109 mg/dL — AB (ref 65–99)
Glucose-Capillary: 69 mg/dL (ref 65–99)
Glucose-Capillary: 86 mg/dL (ref 65–99)

## 2018-03-10 LAB — BASIC METABOLIC PANEL
ANION GAP: 15 (ref 5–15)
BUN: 31 mg/dL — ABNORMAL HIGH (ref 6–20)
CALCIUM: 9.8 mg/dL (ref 8.9–10.3)
CO2: 25 mmol/L (ref 22–32)
CREATININE: 9.58 mg/dL — AB (ref 0.61–1.24)
Chloride: 95 mmol/L — ABNORMAL LOW (ref 101–111)
GFR calc Af Amer: 5 mL/min — ABNORMAL LOW (ref >60)
GFR, EST NON AFRICAN AMERICAN: 5 mL/min — AB (ref >60)
Glucose, Bld: 91 mg/dL (ref 65–99)
Potassium: 4.7 mmol/L (ref 3.5–5.1)
SODIUM: 135 mmol/L (ref 135–145)

## 2018-03-10 MED ORDER — OLANZAPINE 5 MG PO TBDP
2.5000 mg | ORAL_TABLET | Freq: Every day | ORAL | Status: DC
  Administered 2018-03-10: 2.5 mg via ORAL
  Filled 2018-03-10 (×2): qty 0.5

## 2018-03-10 MED ORDER — TRAZODONE HCL 50 MG PO TABS
25.0000 mg | ORAL_TABLET | Freq: Every evening | ORAL | Status: DC | PRN
  Administered 2018-03-11: 25 mg via ORAL
  Filled 2018-03-10: qty 1

## 2018-03-10 MED ORDER — CEPHALEXIN 500 MG PO CAPS
500.0000 mg | ORAL_CAPSULE | Freq: Two times a day (BID) | ORAL | Status: DC
  Administered 2018-03-10: 500 mg via ORAL
  Filled 2018-03-10: qty 1

## 2018-03-10 MED ORDER — LORAZEPAM 2 MG/ML IJ SOLN
1.0000 mg | INTRAMUSCULAR | Status: DC | PRN
  Administered 2018-03-11: 1 mg via INTRAVENOUS
  Filled 2018-03-10 (×2): qty 1

## 2018-03-10 MED ORDER — OLANZAPINE 5 MG PO TBDP
5.0000 mg | ORAL_TABLET | Freq: Every day | ORAL | Status: DC
Start: 2018-03-10 — End: 2018-03-10

## 2018-03-10 MED ORDER — GLYCOPYRROLATE 0.2 MG/ML IJ SOLN: 0.2000 mg | INTRAMUSCULAR | Status: DC | PRN

## 2018-03-10 MED ORDER — OXYCODONE HCL 5 MG PO TABS: 5.0000 mg | ORAL_TABLET | ORAL | Status: DC | PRN

## 2018-03-10 MED ORDER — LORAZEPAM 1 MG PO TABS: 1.0000 mg | ORAL_TABLET | ORAL | Status: DC | PRN

## 2018-03-10 MED ORDER — GLYCOPYRROLATE 1 MG PO TABS
1.0000 mg | ORAL_TABLET | ORAL | Status: DC | PRN
  Filled 2018-03-10: qty 1

## 2018-03-10 MED ORDER — CHLORHEXIDINE GLUCONATE CLOTH 2 % EX PADS: 6.0000 | MEDICATED_PAD | Freq: Every day | CUTANEOUS | Status: DC

## 2018-03-10 MED ORDER — LORAZEPAM 2 MG/ML PO CONC
1.0000 mg | ORAL | Status: DC | PRN
  Administered 2018-03-10 – 2018-03-11 (×2): 1 mg via SUBLINGUAL
  Filled 2018-03-10 (×2): qty 1

## 2018-03-10 MED ORDER — ACETAMINOPHEN 325 MG PO TABS
650.0000 mg | ORAL_TABLET | Freq: Four times a day (QID) | ORAL | Status: DC
  Administered 2018-03-10 – 2018-03-11 (×2): 650 mg via ORAL
  Filled 2018-03-10 (×2): qty 2

## 2018-03-10 MED ORDER — GLYCOPYRROLATE 0.2 MG/ML IJ SOLN
0.2000 mg | INTRAMUSCULAR | Status: DC | PRN
  Filled 2018-03-10: qty 1

## 2018-03-10 NOTE — Discharge Summary (Signed)
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 Work up including MRI brain showed no reversible or organic causes for encephalopathy.Palliative care has been consulted for goals of care and end-of-life discussions. Patient was observed off dialysis. He remained with waxing and waning mentation. Family ultimately decided to transition him to comfort care and hospice.   Discharge Diagnoses:  Active Problems:   Hypertension   ESRD needing dialysis (HCC)   Goals of care, counseling/discussion   GERD (gastroesophageal reflux disease)   Acute respiratory failure with hypoxia (HCC)   SOB (shortness of breath)   Hypoxia   Acute delirium   Dementia with behavioral disturbance   Palliative care by specialist   HCAP (healthcare-associated pneumonia)   Streptococcal bacteremia   Hypoglycemia   ESRD on dialysis [facility])   Palliative care encounter   Advance care planning  Acute encephalopathy on chronic dementia: Likely element of acute delirium, possibly worsened by pneumonia, hypoglycemia, respiratory distress on decreased cerebral reserve. Ammonia, B12, TSH, cortisolwnl.   Hypoglycemia: Recurrent, due to decreased po intake. Treated with D10.  Dysphagia: Unclear precipitant with no stroke on imaging, possibly related  to mental status changes. Appreciate serialSLP evaluations, currently on dysphagia1diet, nectar-thickened liquids.   ESRD: Nephrology following.   LLL pneumonia: Fever on 5/26, no leukocytosis. Started vanc/unasyn, converted to zosyn then rocephin   S. viridans in 1 of 4 blood cultures: More consistent with contaminant, but susceptible to cephalosporins as above.   Chronic AFib: Rate-controlled - Not on anticoagulation due to nonadherence  HTN:  - BP stable this morning   COPD: No wheezing.   Discharge Instructions   Allergies as of 03/10/2018   No Known Allergies     Medication List    STOP taking these medications   ammonium lactate 12 % cream Commonly known as:  AMLACTIN   AURYXIA 1 GM 210 MG(Fe) tablet Generic drug:  ferric citrate   calcitRIOL 0.5 MCG capsule Commonly known as:  ROCALTROL   calcium acetate 667 MG capsule Commonly known as:  PHOSLO   dextromethorphan-guaiFENesin 30-600 MG 12hr tablet Commonly known as:  MUCINEX DM   esomeprazole 20 MG capsule Commonly known as:  NEXIUM   metoprolol succinate 25 MG 24 hr tablet Commonly known as:  TOPROL-XL   nicotine 21 mg/24hr patch Commonly known as:  NICODERM CQ - dosed in mg/24 hours   triamcinolone cream 0.1 % Commonly known as:  KENALOG       No Known Allergies  Consultations:  Nephrology  Palliative care    Procedures/Studies: Dg Chest 2 View  Result Date: 02/20/2018 CLINICAL DATA:  Shortness of breath EXAM: CHEST - 2 VIEW COMPARISON:  01/25/2018 FINDINGS: Cardiac shadow is mildly enlarged but stable. Aortic calcifications are again seen. The lungs are well aerated with mild left basilar atelectasis. No sizable effusion is noted. No bony abnormality is seen. IMPRESSION: Stable left basilar atelectasis. Electronically Signed   By: Alcide Clever  M.D.   On: 02/20/2018 10:58   Shane [name] Contrast  Result Date: 03/05/2018 CLINICAL DATA:  Altered level of consciousness. History of  dementia with acute delirium. EXAM: MRI HEAD WITHOUT CONTRAST TECHNIQUE: Multiplanar, multiecho pulse sequences of the brain and surrounding structures were obtained without intravenous contrast. COMPARISON:  Head CT 08/24/2017 FINDINGS: Brain: Axial diffusion imaging is diagnostic and negative for infarct. Generalized atrophy. Low-density throughout the cerebral white matter consistent with chronic small vessel ischemia, grossly stable when compared to 2017. No hydrocephalus or shift. Vascular: Grossly preserved flow voids on axial T2 weighted imaging. Skull and upper cervical spine: No gross destructive process. Sinuses/Orbits: No acute finding.  Tornwaldt cyst, incidental. Other: Significant motion degradation, only diffusion and T2 weighted imaging was acquired. IMPRESSION: 1. Significantly motion degraded, only diffusion and T2 weighted imaging could be acquired. 2. Negative for infarct or other acute finding. 3. 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 Electronically Signed   By: Marnee Spring M.D.   On: 03/05/2018 12:37   Dg Chest Port 1 View  Result Date: 03/03/2018 CLINICAL DATA:  Aspiration pneumonia.  Fever. EXAM: PORTABLE CHEST 1 VIEW COMPARISON:  03/02/2018 FINDINGS: Left lower lobe consolidation. Right lung is clear. No pneumothorax. No pleural effusion. Mild cardiomegaly. Vascular congestion without interstitial edema. Nodular density at the right lung base may represent a nipple shadow. IMPRESSION: Left lower lobe pneumonia. Cardiomegaly and vascular congestion. Nodular density at the right lung base. Attention on follow-up radiograph is recommended. Electronically Signed   By: Jolaine Click M.D.   On: 03/03/2018 11:04   Dg Chest Port 1 View  Result Date: 03/02/2018 CLINICAL DATA:  shortness of breath EXAM: PORTABLE CHEST - 1 VIEW COMPARISON:  02/23/2018 FINDINGS: Perihilar vascular congestion and interstitial opacities left greater than right as before. Stable  cardiomegaly.  Aortic Atherosclerosis (ICD10-170.0) No definite effusion.  No pneumothorax. Visualized bones unremarkable. IMPRESSION: Stable cardiomegaly and perihilar infiltrates or edema left greater than right. Electronically Signed   By: Corlis Leak M.D.   On: 03/02/2018 11:45   Dg Chest Port 1 View  Result Date: 02/23/2018 CLINICAL DATA:  Acute onset of shortness of breath. EXAM: PORTABLE CHEST 1 VIEW COMPARISON:  Chest radiograph performed 02/20/2018 FINDINGS: A small left pleural effusion is noted. Mild left basilar airspace opacity may reflect atelectasis or possibly mild asymmetric interstitial edema. No pneumothorax is seen, though the lung apices are partially obscured by the patient's head. The cardiomediastinal silhouette is borderline normal in size. No acute osseous abnormalities are seen. IMPRESSION: Small left pleural effusion noted. Mild left basilar airspace opacity may reflect atelectasis or possibly mild asymmetric interstitial edema. Electronically Signed   By: Roanna Raider M.D.   On: 02/23/2018 21:38   Dg Swallowing Func-speech Pathology  Result Date: 03/01/2018 Objective Swallowing Evaluation: Type of Study: MBS-Modified Barium Swallow Study  Patient Details Name: VERNIS EID MRN: [id number] Date of Birth: 01/11/1941 Today's Date: 03/01/2018 Time: SLP Start Time (ACUTE ONLY): 1016 -SLP Stop Time (ACUTE ONLY): 1036 SLP Time Calculation (min) (ACUTE ONLY): 20 min Past Medical History: Past Medical History: Diagnosis Date . 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 Arthritis   bursitis in hip . Bilateral inguinal hernia (BIH)   right > left side . 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 Reynolds Bowl . Elevated troponin 04/12/2015 . Full dentures  . Hypertension  . Tobacco abuse  . Urinary  tract infection due to Enterococcus 08/03/2015 . Varicocele  Past Surgical History: Past Surgical History:  Procedure Laterality Date . AV FISTULA PLACEMENT, BRACHIOCEPHALIC  10/19/2010  LEFT . AV FISTULA PLACEMENT, RADIOCEPHALIC  01/30/2011  Right w/ ligation of competing venous branch by Dr. Leonides SakeBrian Chen . AV FISTULA PLACEMENT, RADIOCEPHALIC  09/13/2010  LEFT . BASCILIC VEIN TRANSPOSITION Left 04/08/2015  Procedure: BASILIC VEIN TRANSPOSITION;  Surgeon: Pryor OchoaJames D Lawson, MD;  [facility];  Service: Vascular;  Laterality: Left; . COLONOSCOPY   . INGUINAL HERNIA REPAIR Bilateral 11/25/2015  Procedure: LAPAROSCOPIC BILATERAL INGUINAL HERNIA REPAIR--EXCISION OF RIGHT HYDROCELE;  Surgeon: Karie SodaSteven Gross, MD;  Location: [facility] OR;  Service: General;  Laterality: Bilateral; . INSERTION OF MESH Bilateral 11/25/2015  Procedure: INSERTION OF MESH;  Surgeon: Karie SodaSteven Gross, MD;  [facility];  Service: General;  Laterality: Bilateral; . MULTIPLE TOOTH EXTRACTIONS   HPI: The patient is a 77 y.o. year-old with hx AL amyloid, ESRD on HD, atrial fib who presents to ED with sob and resp distress. Per chart missed Wed and Fri Dialysis treatments last week and had been leaving early in treatments prior to that. Found to be grossly volume overloaded and hyperkalemic. CXR Small left pleural effusion noted. Mild left basilar airspace opacity may reflect atelectasis or possibly mild asymmetric interstitial edema.`  No data recorded Assessment / Plan / Recommendation CHL IP CLINICAL IMPRESSIONS 03/01/2018 Clinical Impression Shane Ane PaymentHooker demonstrated oropharyngeal phase swallow primarily due to decreased sensation and cognitive deficits impacting swallow function resulting aspiration of thin liquids. Reduced oral cohesion with premature spill and lingual pumping to move to posterior pharynx. Swallows were initiated at the pyriform sinuses and straw sip thin resulted in aspiration as pt speaking with barium in valleculae and pyriform sinuses with cough as well as aspiration with cup. Aspiration event initiated a coughing episode which lasted for 5-7  minutes (coughing every 15 seconds). Pt continued to bring barium into oral cavity after coughs when mouth and pharynx thought to be previously mostly cleared (no significant residue). Esophageal residue suspected, scanned esophagus revealing what appeared to be mild residue at distal esophagus. Uncertain if originated from esophagus and clear by the time esophagus scanned or mucous from lungs mixed with barium residue in oral cavity. Recommend continue Dys 3 texture, nectar thick liquids, pills whole in applesauce, po's only when alert. ST will continue intervention.  SLP Visit Diagnosis Dysphagia, oropharyngeal phase (R13.12) Attention and concentration deficit following -- Frontal lobe and executive function deficit following -- Impact on safety and function Moderate aspiration risk;Severe aspiration risk   CHL IP TREATMENT RECOMMENDATION 03/01/2018 Treatment Recommendations Therapy as outlined in treatment plan below   Prognosis 03/01/2018 Prognosis for Safe Diet Advancement Good Barriers to Reach Goals Cognitive deficits Barriers/Prognosis Comment -- CHL IP DIET RECOMMENDATION 03/01/2018 SLP Diet Recommendations Nectar thick liquid;Dysphagia 3 (Mech soft) solids Liquid Administration via Cup;No straw Medication Administration Whole meds with puree Compensations Slow rate;Small sips/bites;Minimize environmental distractions;Lingual sweep for clearance of pocketing Postural Changes Seated upright at 90 degrees;Remain semi-upright after after feeds/meals (Comment)   CHL IP OTHER RECOMMENDATIONS 03/01/2018 Recommended Consults -- Oral Care Recommendations Oral care BID Other Recommendations --   CHL IP FOLLOW UP RECOMMENDATIONS 03/01/2018 Follow up Recommendations Other (comment)   CHL IP FREQUENCY AND DURATION 03/01/2018 Speech Therapy Frequency (ACUTE ONLY) min 2x/week Treatment Duration 2 weeks      CHL IP ORAL PHASE 03/01/2018 Oral Phase Impaired Oral - Pudding Teaspoon -- Oral - Pudding Cup -- Oral - Honey Teaspoon  --  Oral - Honey Cup -- Oral - Nectar Teaspoon -- Oral - Nectar Cup Delayed oral transit;Decreased bolus cohesion Oral - Nectar Straw -- Oral - Thin Teaspoon -- Oral - Thin Cup Premature spillage;Decreased bolus cohesion;Lingual pumping Oral - Thin Straw Decreased bolus cohesion;Premature spillage Oral - Puree -- Oral - Mech Soft -- Oral - Regular Other (Comment) Oral - Multi-Consistency -- Oral - Pill -- Oral Phase - Comment --  CHL IP PHARYNGEAL PHASE 03/01/2018 Pharyngeal Phase Impaired Pharyngeal- Pudding Teaspoon -- Pharyngeal -- Pharyngeal- Pudding Cup -- Pharyngeal -- Pharyngeal- Honey Teaspoon -- Pharyngeal -- Pharyngeal- Honey Cup -- Pharyngeal -- Pharyngeal- Nectar Teaspoon -- Pharyngeal -- Pharyngeal- Nectar Cup Delayed swallow initiation-pyriform sinuses Pharyngeal -- Pharyngeal- Nectar Straw -- Pharyngeal -- Pharyngeal- Thin Teaspoon -- Pharyngeal -- Pharyngeal- Thin Cup Delayed swallow initiation-pyriform sinuses;Penetration/Aspiration during swallow Pharyngeal Material enters airway, remains ABOVE vocal cords and not ejected out Pharyngeal- Thin Straw Penetration/Aspiration during swallow;Delayed swallow initiation-pyriform sinuses Pharyngeal Material enters airway, passes BELOW cords and not ejected out despite cough attempt by patient Pharyngeal- Puree -- Pharyngeal -- Pharyngeal- Mechanical Soft -- Pharyngeal -- Pharyngeal- Regular (No Data) Pharyngeal -- Pharyngeal- Multi-consistency -- Pharyngeal -- Pharyngeal- Pill -- Pharyngeal -- Pharyngeal Comment --  CHL IP CERVICAL ESOPHAGEAL PHASE 03/01/2018 Cervical Esophageal Phase WFL Pudding Teaspoon -- Pudding Cup -- Honey Teaspoon -- Honey Cup -- Nectar Teaspoon -- Nectar Cup -- Nectar Straw -- Thin Teaspoon -- Thin Cup -- Thin Straw -- Puree -- Mechanical Soft -- Regular -- Multi-consistency -- Pill -- Cervical Esophageal Comment -- No flowsheet data found. Royce Macadamia 03/01/2018, 12:15 PM Breck Coons Lonell Face.Ed CCC-SLP Pager [phone]                Discharge Exam: Vitals:   03/10/18 0354 03/10/18 2021  BP: 102/66 (!) 95/57  Pulse: (!) 143 95  Resp: (!) 21   Temp: 98.8 F (37.1 C) 98.2 F (36.8 C)  SpO2: 92% 90%   General exam: Appears confused, trying to take of mittens  Respiratory system: Clear to auscultation. Respiratory effort normal. Cardiovascular system: S1 & S2 heard, Irreg rhythm, rate controlled . No JVD, murmurs, rubs, gallops or clicks. No pedal edema. Gastrointestinal system: Abdomen is nondistended, soft and nontender. No organomegaly or masses felt. Normal bowel sounds heard. Central nervous system: Alert  Extremities: Symmetric  Skin: No rashes, lesions or ulcers on exposed skin  Psychiatry: Confused, delirious, dementia     The results of significant diagnostics from this hospitalization (including imaging, microbiology, ancillary and laboratory) are listed below for reference.     Microbiology: Recent Results (from the past 240 hour(s))  Culture, blood (routine x 2)     Status: Abnormal   Collection Time: 03/03/18 10:29 AM  Result Value Ref Range Status   Specimen Description BLOOD SITE NOT SPECIFIED  Final   Special Requests   Final    BOTTLES DRAWN AEROBIC ONLY Blood Culture adequate volume   Culture  Setup Time   Final    GRAM POSITIVE COCCI IN CHAINS AEROBIC BOTTLE ONLY CRITICAL RESULT CALLED TO, READ BACK BY AND VERIFIED WITH: C ROBERTSON,PHARMD AT 1414 03/04/18 BY L BENFIELD Performed at [facility], [address] [place], [place] [address]    Culture VIRIDANS STREPTOCOCCUS (A)  Final   Report Status 03/06/2018 FINAL  Final   Organism ID, Bacteria VIRIDANS STREPTOCOCCUS  Final      Susceptibility   Viridans streptococcus - MIC*    PENICILLIN 0.5 INTERMEDIATE Intermediate  CEFTRIAXONE 0.25 SENSITIVE Sensitive     ERYTHROMYCIN 2 RESISTANT Resistant     LEVOFLOXACIN 1 SENSITIVE Sensitive     VANCOMYCIN 0.25 SENSITIVE Sensitive     * VIRIDANS STREPTOCOCCUS  Culture,  blood (routine x 2)     Status: None   Collection Time: 03/03/18 10:29 AM  Result Value Ref Range Status   Specimen Description BLOOD SITE NOT SPECIFIED  Final   Special Requests   Final    BOTTLES DRAWN AEROBIC ONLY Blood Culture adequate volume   Culture   Final    NO GROWTH 5 DAYS Performed at [facility], [address] [place], [place] [address]    Report Status 03/08/2018 FINAL  Final  Blood Culture ID Panel (Reflexed)     Status: Abnormal   Collection Time: 03/03/18 10:29 AM  Result Value Ref Range Status   Enterococcus species NOT DETECTED NOT DETECTED Final   Listeria monocytogenes NOT DETECTED NOT DETECTED Final   Staphylococcus species NOT DETECTED NOT DETECTED Final   Staphylococcus aureus NOT DETECTED NOT DETECTED Final   Streptococcus species DETECTED (A) NOT DETECTED Final    Comment: Not Enterococcus species, Streptococcus agalactiae, Streptococcus pyogenes, or Streptococcus pneumoniae. CRITICAL RESULT CALLED TO, READ BACK BY AND VERIFIED WITH: C ROBERTSON,PHARMD AT 1414 03/04/18 BY L BENFIELD    Streptococcus agalactiae NOT DETECTED NOT DETECTED Final   Streptococcus pneumoniae NOT DETECTED NOT DETECTED Final   Streptococcus pyogenes NOT DETECTED NOT DETECTED Final   Acinetobacter baumannii NOT DETECTED NOT DETECTED Final   Enterobacteriaceae species NOT DETECTED NOT DETECTED Final   Enterobacter cloacae complex NOT DETECTED NOT DETECTED Final   Escherichia coli NOT DETECTED NOT DETECTED Final   Klebsiella oxytoca NOT DETECTED NOT DETECTED Final   Klebsiella pneumoniae NOT DETECTED NOT DETECTED Final   Proteus species NOT DETECTED NOT DETECTED Final   Serratia marcescens NOT DETECTED NOT DETECTED Final   Haemophilus influenzae NOT DETECTED NOT DETECTED Final   Neisseria meningitidis NOT DETECTED NOT DETECTED Final   Pseudomonas aeruginosa NOT DETECTED NOT DETECTED Final   Candida albicans NOT DETECTED NOT DETECTED Final   Candida glabrata NOT DETECTED NOT  DETECTED Final   Candida krusei NOT DETECTED NOT DETECTED Final   Candida parapsilosis NOT DETECTED NOT DETECTED Final   Candida tropicalis NOT DETECTED NOT DETECTED Final    Comment: Performed at [facility], [address], [place], [place] [address]  Culture, blood (routine x 2)     Status: None (Preliminary result)   Collection Time: 03/06/18  8:00 PM  Result Value Ref Range Status   Specimen Description BLOOD RIGHT HAND  Final   Special Requests   Final    BOTTLES DRAWN AEROBIC AND ANAEROBIC Blood Culture results may not be optimal due to an inadequate volume of blood received in culture bottles   Culture   Final    NO GROWTH 4 DAYS Performed at [facility], [address], [place], [place] [address]    Report Status PENDING  Incomplete  Culture, blood (routine x 2)     Status: None (Preliminary result)   Collection Time: 03/06/18  8:00 PM  Result Value Ref Range Status   Specimen Description BLOOD RIGHT HAND  Final   Special Requests   Final    BOTTLES DRAWN AEROBIC ONLY Blood Culture adequate volume   Culture   Final    NO GROWTH 4 DAYS Performed at [facility], [address], [place],  [place] [address]    Report Status PENDING  Incomplete     Labs: BNP (last 3 results) Recent Labs    04/23/17 0921 10/12/17 1128 02/23/18 2055  BNP 2,928.7* >4,500.0* >4,500.0*   Basic Metabolic Panel: Recent Labs  Lab 03/04/18 0804 03/06/18 0846 03/08/18 0911 03/10/18 0540  NA 132* 133* 135 135  K 5.1 4.4 4.6 4.7  CL 92* 94* 99* 95*  CO2 29 28 22 25   GLUCOSE 94 84 96 91  BUN 30* 21* 19 31*  CREATININE 8.54* 6.74* 6.75* 9.58*  CALCIUM 9.5 9.4 9.3 9.8  PHOS 3.4 2.5  --   --    Liver Function Tests: Recent Labs  Lab 03/04/18 0804 03/06/18 0846  ALBUMIN 2.8* 2.7*   No results for input(s): LIPASE, AMYLASE in the last 168 hours. No results for input(s): AMMONIA in the last 168 hours. CBC: Recent Labs  Lab 03/04/18 0805 03/06/18 0846  WBC 7.2  5.8  HGB 9.5* 9.7*  HCT 29.5* 30.4*  MCV 100.0 99.3  PLT 133* 178   Cardiac Enzymes: No results for input(s): CKTOTAL, CKMB, CKMBINDEX, TROPONINI in the last 168 hours. BNP: Invalid input(s): POCBNP CBG: Recent Labs  Lab 03/10/18 0530 03/10/18 0805 03/10/18 1221 03/10/18 1734 03/10/18 2042  GLUCAP 90 84 86 91 109*   D-Dimer No results for input(s): DDIMER in the last 72 hours. Hgb A1c No results for input(s): HGBA1C in the last 72 hours. Lipid Profile No results for input(s): CHOL, HDL, LDLCALC, TRIG, CHOLHDL, LDLDIRECT in the last 72 hours. Thyroid function studies No results for input(s): TSH, T4TOTAL, T3FREE, THYROIDAB in the last 72 hours.  Invalid input(s): FREET3 Anemia work up No results for input(s): VITAMINB12, FOLATE, FERRITIN, TIBC, IRON, RETICCTPCT in the last 72 hours. Urinalysis    Component Value Date/Time   COLORURINE AMBER (A) 01/25/2018 1540   APPEARANCEUR CLEAR 01/25/2018 1540   LABSPEC 1.014 01/25/2018 1540   PHURINE 7.0 01/25/2018 1540   GLUCOSEU 50 (A) 01/25/2018 1540   HGBUR NEGATIVE 01/25/2018 1540   BILIRUBINUR NEGATIVE 01/25/2018 1540   KETONESUR NEGATIVE 01/25/2018 1540   PROTEINUR 100 (A) 01/25/2018 1540   UROBILINOGEN 0.2 08/01/2015 1430   NITRITE NEGATIVE 01/25/2018 1540   LEUKOCYTESUR NEGATIVE 01/25/2018 1540   Sepsis Labs Invalid input(s): PROCALCITONIN,  WBC,  LACTICIDVEN Microbiology Recent Results (from the past 240 hour(s))  Culture, blood (routine x 2)     Status: Abnormal   Collection Time: 03/03/18 10:29 AM  Result Value Ref Range Status   Specimen Description BLOOD SITE NOT SPECIFIED  Final   Special Requests   Final    BOTTLES DRAWN AEROBIC ONLY Blood Culture adequate volume   Culture  Setup Time   Final    GRAM POSITIVE COCCI IN CHAINS AEROBIC BOTTLE ONLY CRITICAL RESULT CALLED TO, READ BACK BY AND VERIFIED WITH: C ROBERTSON,PHARMD AT 1414 03/04/18 BY L BENFIELD Performed at [facility], [address], [place], [place] [address]    Culture VIRIDANS STREPTOCOCCUS (A)  Final   Report Status 03/06/2018 FINAL  Final   Organism ID, Bacteria VIRIDANS STREPTOCOCCUS  Final      Susceptibility   Viridans streptococcus - MIC*    PENICILLIN 0.5 INTERMEDIATE Intermediate     CEFTRIAXONE 0.25 SENSITIVE Sensitive     ERYTHROMYCIN 2 RESISTANT Resistant     LEVOFLOXACIN 1 SENSITIVE Sensitive     VANCOMYCIN 0.25 SENSITIVE Sensitive     * VIRIDANS STREPTOCOCCUS  Culture, blood (routine x 2)  Status: None   Collection Time: 03/03/18 10:29 AM  Result Value Ref Range Status   Specimen Description BLOOD SITE NOT SPECIFIED  Final   Special Requests   Final    BOTTLES DRAWN AEROBIC ONLY Blood Culture adequate volume   Culture   Final    NO GROWTH 5 DAYS Performed at [facility], [address], [place], [place] [address]    Report Status 03/08/2018 FINAL  Final  Blood Culture ID Panel (Reflexed)     Status: Abnormal   Collection Time: 03/03/18 10:29 AM  Result Value Ref Range Status   Enterococcus species NOT DETECTED NOT DETECTED Final   Listeria monocytogenes NOT DETECTED NOT DETECTED Final   Staphylococcus species NOT DETECTED NOT DETECTED Final   Staphylococcus aureus NOT DETECTED NOT DETECTED Final   Streptococcus species DETECTED (A) NOT DETECTED Final    Comment: Not Enterococcus species, Streptococcus agalactiae, Streptococcus pyogenes, or Streptococcus pneumoniae. CRITICAL RESULT CALLED TO, READ BACK BY AND VERIFIED WITH: C ROBERTSON,PHARMD AT 1414 03/04/18 BY L BENFIELD    Streptococcus agalactiae NOT DETECTED NOT DETECTED Final   Streptococcus pneumoniae NOT DETECTED NOT DETECTED Final   Streptococcus pyogenes NOT DETECTED NOT DETECTED Final   Acinetobacter baumannii NOT DETECTED NOT DETECTED Final   Enterobacteriaceae species NOT DETECTED NOT DETECTED Final   Enterobacter cloacae complex NOT DETECTED NOT DETECTED Final   Escherichia coli NOT DETECTED NOT DETECTED Final    Klebsiella oxytoca NOT DETECTED NOT DETECTED Final   Klebsiella pneumoniae NOT DETECTED NOT DETECTED Final   Proteus species NOT DETECTED NOT DETECTED Final   Serratia marcescens NOT DETECTED NOT DETECTED Final   Haemophilus influenzae NOT DETECTED NOT DETECTED Final   Neisseria meningitidis NOT DETECTED NOT DETECTED Final   Pseudomonas aeruginosa NOT DETECTED NOT DETECTED Final   Candida albicans NOT DETECTED NOT DETECTED Final   Candida glabrata NOT DETECTED NOT DETECTED Final   Candida krusei NOT DETECTED NOT DETECTED Final   Candida parapsilosis NOT DETECTED NOT DETECTED Final   Candida tropicalis NOT DETECTED NOT DETECTED Final    Comment: Performed at [facility], [address], [place], [place] [address]  Culture, blood (routine x 2)     Status: None (Preliminary result)   Collection Time: 03/06/18  8:00 PM  Result Value Ref Range Status   Specimen Description BLOOD RIGHT HAND  Final   Special Requests   Final    BOTTLES DRAWN AEROBIC AND ANAEROBIC Blood Culture results may not be optimal due to an inadequate volume of blood received in culture bottles   Culture   Final    NO GROWTH 4 DAYS Performed at [facility], [address] [place], [place] [address]    Report Status PENDING  Incomplete  Culture, blood (routine x 2)     Status: None (Preliminary result)   Collection Time: 03/06/18  8:00 PM  Result Value Ref Range Status   Specimen Description BLOOD RIGHT HAND  Final   Special Requests   Final    BOTTLES DRAWN AEROBIC ONLY Blood Culture adequate volume   Culture   Final    NO GROWTH 4 DAYS Performed at [facility], [address], [place], [place] [address]    Report Status PENDING  Incomplete     Patient was seen and examined on the day of discharge and was found to be in stable condition. Time coordinating discharge: 35 minutes including assessment and coordination of care, as well as  examination of the patient.   SIGNED:  Noralee Stain,  DO [facility] Pager [phone]  If 7PM-7AM, please contact night-coverage [email or web address] Password [facility] 03/10/2018, 8:58 PM

## 2018-03-10 NOTE — Progress Notes (Addendum)
PROGRESS NOTE    Shane [name]  NWG:[id number] DOB: 1941/04/04 DOA: 02/23/2018 PCP: Ailene RavelHamrick, Maura L, MD     Brief Narrative:  Shane [name] is a 77 y.o. 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 He has been nonadherent to hemodialysis as an outpatient and very frequently signed off early. Unfortunately now suffering from agitated delirium, decreased po intake, and subsequent hypoglycemic episodes. Work up including MRI brain showed no reversible or organic causes for encephalopathy. Palliative care has been consulted for goals of care and end-of-life discussions.  New events last 24 hours / Subjective: Patient in bed with mittens on. He is alert this morning, mumbles few words, states he's "fine" but appears confused and agitated. Does not answer questions appropriately or interact   Assessment & Plan:   Active Problems:   Hypertension   ESRD needing dialysis (HCC)   Goals of care, counseling/discussion   GERD (gastroesophageal reflux disease)   Acute respiratory failure with hypoxia (HCC)   SOB (shortness of breath)   Hypoxia   Acute delirium   Dementia with behavioral disturbance   Palliative care by specialist   HCAP (healthcare-associated pneumonia)   Streptococcal bacteremia   Hypoglycemia   ESRD on dialysis [facility](HCC)   Palliative care encounter   Advance care planning   Acute encephalopathy on chronic dementia: Likely element of acute delirium, possibly worsened by pneumonia, hypoglycemia, respiratory distress on decreased cerebral reserve. Ammonia, B12, TSH, cortisol wnl.  - Continue to optimize medical management of comorbidities.  - Delirium precautions.  - Minimize sedating medications.   Hypoglycemia: Recurrent, due to decreased po intake.  - Monitor CBG q4h, D50 push prn.  Currently on D10. Hoping to avoid excess volume with ESRD/pulmonary edema at admission.  - Blood sugar 84 this morning   Dysphagia: Unclear precipitant with no stroke on imaging, possibly related to mental status changes.  - Appreciate serial SLP evaluations, currently on dysphagia 1 diet, nectar-thickened liquids.   ESRD:  - Nephrology following. HD on hold for now until goals of care are clear.   LLL pneumonia: Fever on 5/26, no leukocytosis. Started vanc/unasyn, converted to zosyn then rocephin  - Continue keflex x7 days  S. viridans in 1 of 4 blood cultures: More consistent with contaminant, but susceptible to cephalosporins as above.  - Continue keflex x7 days - Repeat blood culture 5/29 negative to date   Chronic AFib: Rate-controlled - Not on anticoagulation due to nonadherence  HTN:  - Continue medications - BP stable this morning   COPD: No wheezing.  - Prn nebs   DVT prophylaxis: Subq hep Code Status: Full Family Communication: No family at bedside Disposition Plan: Pending ongoing palliative care discussions. Patient's mentation continues to wax and wane. Not clear that he is a good candidate for dialysis going forward if his mentation does not stabilize.    Consultants:   Nephrology  Palliative care  Procedures:   None   Antimicrobials:  Anti-infectives (From admission, onward)   Start     Dose/Rate Route Frequency Ordered Stop   03/10/18 1000  cephALEXin (KEFLEX) capsule 500 mg     500 mg Oral Every 12 hours 03/10/18 0725 03/17/18 0959   03/06/18 1900  cefTRIAXone (ROCEPHIN) 2 g in sodium chloride 0.9 % 100 mL IVPB     2 g 200  mL/hr over 30 Minutes Intravenous Every 24 hours 03/06/18 1855 03/09/18 1742   03/04/18 1200  vancomycin (VANCOCIN) 500 mg in sodium chloride 0.9 % 100 mL IVPB  Status:  Discontinued     500 mg 100 mL/hr over 60 Minutes Intravenous Every M-W-F (Hemodialysis) 03/03/18 1634 03/05/18 1441   03/04/18 1019  vancomycin  (VANCOCIN) 500-5 MG/100ML-% IVPB    Note to Pharmacy:  Davy Pique   : cabinet override      03/04/18 1019 03/04/18 1040   03/03/18 2200  piperacillin-tazobactam (ZOSYN) IVPB 3.375 g  Status:  Discontinued     3.375 g 12.5 mL/hr over 240 Minutes Intravenous Every 12 hours 03/03/18 1634 03/06/18 1855   03/03/18 1700  vancomycin (VANCOCIN) 500 mg in sodium chloride 0.9 % 100 mL IVPB     500 mg 100 mL/hr over 60 Minutes Intravenous  Once 03/03/18 1634 03/03/18 2049   03/03/18 1200  Ampicillin-Sulbactam (UNASYN) 3 g in sodium chloride 0.9 % 100 mL IVPB  Status:  Discontinued     3 g 200 mL/hr over 30 Minutes Intravenous 2 times daily 03/03/18 1102 03/03/18 1613       Objective: Vitals:   03/09/18 0609 03/09/18 1647 03/09/18 2100 03/10/18 0354  BP: (!) 122/95 108/85 109/70 102/66  Pulse: 95 98  (!) 143  Resp: 19 (!) 26 (!) 23 (!) 21  Temp: 99 F (37.2 C) 97.6 F (36.4 C)  98.8 F (37.1 C)  TempSrc: Axillary Oral  Axillary  SpO2: 98% (!) 82%  92%  Weight:    62.7 kg (138 lb 3.7 oz)  Height:        Intake/Output Summary (Last 24 hours) at 03/10/2018 0830 Last data filed at 03/09/2018 1500 Gross per 24 hour  Intake 270 ml  Output -  Net 270 ml   Filed Weights   03/08/18 0437 03/09/18 0448 03/10/18 0354  Weight: 60.6 kg (133 lb 9.6 oz) 61.7 kg (136 lb 0.4 oz) 62.7 kg (138 lb 3.7 oz)    Examination: General exam: Appears confused, trying to take of mittens  Respiratory system: Clear to auscultation. Respiratory effort normal. Cardiovascular system: S1 & S2 heard, Irreg rhythm, rate controlled . No JVD, murmurs, rubs, gallops or clicks. No pedal edema. Gastrointestinal system: Abdomen is nondistended, soft and nontender. No organomegaly or masses felt. Normal bowel sounds heard. Central nervous system: Alert  Extremities: Symmetric  Skin: No rashes, lesions or ulcers on exposed skin  Psychiatry: Confused, delirious, dementia     Data Reviewed: I have personally reviewed  following labs and imaging studies  CBC: Recent Labs  Lab 03/04/18 0805 03/06/18 0846  WBC 7.2 5.8  HGB 9.5* 9.7*  HCT 29.5* 30.4*  MCV 100.0 99.3  PLT 133* 178   Basic Metabolic Panel: Recent Labs  Lab 03/04/18 0804 03/06/18 0846 03/08/18 0911 03/10/18 0540  NA 132* 133* 135 135  K 5.1 4.4 4.6 4.7  CL 92* 94* 99* 95*  CO2 29 28 22 25   GLUCOSE 94 84 96 91  BUN 30* 21* 19 31*  CREATININE 8.54* 6.74* 6.75* 9.58*  CALCIUM 9.5 9.4 9.3 9.8  PHOS 3.4 2.5  --   --    GFR: Estimated Creatinine Clearance: 5.7 mL/min (A) (by C-G formula based on SCr of 9.58 mg/dL (H)). Liver Function Tests: Recent Labs  Lab 03/04/18 0804 03/06/18 0846  ALBUMIN 2.8* 2.7*   No results for input(s): LIPASE, AMYLASE in the last 168 hours. No results for input(s): AMMONIA in  the last 168 hours. Coagulation Profile: No results for input(s): INR, PROTIME in the last 168 hours. Cardiac Enzymes: No results for input(s): CKTOTAL, CKMB, CKMBINDEX, TROPONINI in the last 168 hours. BNP (last 3 results) No results for input(s): PROBNP in the last 8760 hours. HbA1C: No results for input(s): HGBA1C in the last 72 hours. CBG: Recent Labs  Lab 03/09/18 2105 03/09/18 2336 03/10/18 0410 03/10/18 0530 03/10/18 0805  GLUCAP 80 88 69 90 84   Lipid Profile: No results for input(s): CHOL, HDL, LDLCALC, TRIG, CHOLHDL, LDLDIRECT in the last 72 hours. Thyroid Function Tests: No results for input(s): TSH, T4TOTAL, FREET4, T3FREE, THYROIDAB in the last 72 hours. Anemia Panel: No results for input(s): VITAMINB12, FOLATE, FERRITIN, TIBC, IRON, RETICCTPCT in the last 72 hours. Sepsis Labs: No results for input(s): PROCALCITON, LATICACIDVEN in the last 168 hours.  Recent Results (from the past 240 hour(s))  Culture, blood (routine x 2)     Status: Abnormal   Collection Time: 03/03/18 10:29 AM  Result Value Ref Range Status   Specimen Description BLOOD SITE NOT SPECIFIED  Final   Special Requests   Final      BOTTLES DRAWN AEROBIC ONLY Blood Culture adequate volume   Culture  Setup Time   Final    GRAM POSITIVE COCCI IN CHAINS AEROBIC BOTTLE ONLY CRITICAL RESULT CALLED TO, READ BACK BY AND VERIFIED WITH: C ROBERTSON,PHARMD AT 1414 03/04/18 BY L BENFIELD Performed at [facility], [address], [place], [place] [address]    Culture VIRIDANS STREPTOCOCCUS (A)  Final   Report Status 03/06/2018 FINAL  Final   Organism ID, Bacteria VIRIDANS STREPTOCOCCUS  Final      Susceptibility   Viridans streptococcus - MIC*    PENICILLIN 0.5 INTERMEDIATE Intermediate     CEFTRIAXONE 0.25 SENSITIVE Sensitive     ERYTHROMYCIN 2 RESISTANT Resistant     LEVOFLOXACIN 1 SENSITIVE Sensitive     VANCOMYCIN 0.25 SENSITIVE Sensitive     * VIRIDANS STREPTOCOCCUS  Culture, blood (routine x 2)     Status: None   Collection Time: 03/03/18 10:29 AM  Result Value Ref Range Status   Specimen Description BLOOD SITE NOT SPECIFIED  Final   Special Requests   Final    BOTTLES DRAWN AEROBIC ONLY Blood Culture adequate volume   Culture   Final    NO GROWTH 5 DAYS Performed at [facility], [address], [place], [place] [address]    Report Status 03/08/2018 FINAL  Final  Blood Culture ID Panel (Reflexed)     Status: Abnormal   Collection Time: 03/03/18 10:29 AM  Result Value Ref Range Status   Enterococcus species NOT DETECTED NOT DETECTED Final   Listeria monocytogenes NOT DETECTED NOT DETECTED Final   Staphylococcus species NOT DETECTED NOT DETECTED Final   Staphylococcus aureus NOT DETECTED NOT DETECTED Final   Streptococcus species DETECTED (A) NOT DETECTED Final    Comment: Not Enterococcus species, Streptococcus agalactiae, Streptococcus pyogenes, or Streptococcus pneumoniae. CRITICAL RESULT CALLED TO, READ BACK BY AND VERIFIED WITH: C ROBERTSON,PHARMD AT 1414 03/04/18 BY L BENFIELD    Streptococcus agalactiae NOT DETECTED NOT DETECTED Final   Streptococcus pneumoniae NOT DETECTED NOT DETECTED  Final   Streptococcus pyogenes NOT DETECTED NOT DETECTED Final   Acinetobacter baumannii NOT DETECTED NOT DETECTED Final   Enterobacteriaceae species NOT DETECTED NOT DETECTED Final   Enterobacter cloacae complex NOT DETECTED NOT DETECTED Final   Escherichia coli NOT DETECTED NOT DETECTED Final  Klebsiella oxytoca NOT DETECTED NOT DETECTED Final   Klebsiella pneumoniae NOT DETECTED NOT DETECTED Final   Proteus species NOT DETECTED NOT DETECTED Final   Serratia marcescens NOT DETECTED NOT DETECTED Final   Haemophilus influenzae NOT DETECTED NOT DETECTED Final   Neisseria meningitidis NOT DETECTED NOT DETECTED Final   Pseudomonas aeruginosa NOT DETECTED NOT DETECTED Final   Candida albicans NOT DETECTED NOT DETECTED Final   Candida glabrata NOT DETECTED NOT DETECTED Final   Candida krusei NOT DETECTED NOT DETECTED Final   Candida parapsilosis NOT DETECTED NOT DETECTED Final   Candida tropicalis NOT DETECTED NOT DETECTED Final    Comment: Performed at [facility], [address] [place], [place] [address]  Culture, blood (routine x 2)     Status: None (Preliminary result)   Collection Time: 03/06/18  8:00 PM  Result Value Ref Range Status   Specimen Description BLOOD RIGHT HAND  Final   Special Requests   Final    BOTTLES DRAWN AEROBIC AND ANAEROBIC Blood Culture results may not be optimal due to an inadequate volume of blood received in culture bottles   Culture   Final    NO GROWTH 3 DAYS Performed at [facility], [address] [place], [place] [address]    Report Status PENDING  Incomplete  Culture, blood (routine x 2)     Status: None (Preliminary result)   Collection Time: 03/06/18  8:00 PM  Result Value Ref Range Status   Specimen Description BLOOD RIGHT HAND  Final   Special Requests   Final    BOTTLES DRAWN AEROBIC ONLY Blood Culture adequate volume   Culture   Final    NO GROWTH 3 DAYS Performed at [facility], [address], [place], [place]  [address]    Report Status PENDING  Incomplete       Radiology Studies: No results found.    Scheduled Meds: . ammonium lactate   Topical Daily  . calcitRIOL  1 mcg Oral Q M,W,F-HD  . cephALEXin  500 mg Oral Q12H  . Chlorhexidine Gluconate Cloth  6 each Topical Q0600  . Chlorhexidine Gluconate Cloth  6 each Topical Q0600  . heparin  5,000 Units Subcutaneous Q8H  . mouth rinse  15 mL Mouth Rinse BID  . metoprolol succinate  12.5 mg Oral Daily  . pantoprazole  40 mg Oral Daily  . triamcinolone cream   Topical Daily   Continuous Infusions: . dextrose 30 mL/hr at 03/09/18 0708     LOS: 15 days    Time spent: 25 minutes   Noralee Stain, DO [facility] [email or web address] Password [email or web address] 03/10/2018, 8:30 AM

## 2018-03-10 NOTE — Progress Notes (Signed)
Palliative care met with family and plan now is transition to comfort care/ hospice , no further dialysis.  Appreciate pall care assistance.  Will sign off.   Kelly Splinter MD [employer] 03/10/2018, 2:55 PM

## 2018-03-10 NOTE — Progress Notes (Signed)
[facility] Progress Note  Subjective:  Vitals:   03/09/18 0609 03/09/18 1647 03/09/18 2100 03/10/18 0354  BP: (!) 122/95 108/85 109/70 102/66  Pulse: 95 98  (!) 143  Resp: 19 (!) 26 (!) 23 (!) 21  Temp: 99 F (37.2 C) 97.6 F (36.4 C)  98.8 F (37.1 C)  TempSrc: Axillary Oral  Axillary  SpO2: 98% (!) 82%  92%  Weight:    62.7 kg (138 lb 3.7 oz)  Height:        Inpatient medications: . ammonium lactate   Topical Daily  . calcitRIOL  1 mcg Oral Q M,W,F-HD  . cephALEXin  500 mg Oral Q12H  . Chlorhexidine Gluconate Cloth  6 each Topical Q0600  . Chlorhexidine Gluconate Cloth  6 each Topical Q0600  . heparin  5,000 Units Subcutaneous Q8H  . mouth rinse  15 mL Mouth Rinse BID  . metoprolol succinate  12.5 mg Oral Daily  . pantoprazole  40 mg Oral Daily  . triamcinolone cream   Topical Daily   . dextrose 30 mL/hr at 03/09/18 [id number]   acetaminophen **OR** acetaminophen, albuterol, bisacodyl, dextrose, nicotine, ondansetron **OR** ondansetron (ZOFRAN) IV, senna-docusate  Exam:  in bed w/ mittens on and restraints, sedated and confused  no jvd  chest cta bilat  cor reg no mrg  abd soft ntnd scaphoid  ext no sig leg edema  left ua avf bruit +  cxr 5/26 - retrocardiac density on left  Dialysis: [facility]   4h  65kg  2/2.25 bath left arm avf  Heparin none      Impression: 1 altered mental status - underlying dementia, may need psych input 2 esrd on hd - poor compliance w op dialysis. Hd for Monday.  3 volume is stable under dry wt 4 anemia ckd hb 9- 10  here 5 hypertension on metoprolol xl only 6 fevers/ supsected PNA LLL -  Sp IV abx, on po keflex now 7 strep bacteremia - viridans strep in 1 bottle felt to be prob contaminant 7 eol - family and pall care having discussions   Plan - dialysis tomorrow  Vinson Moselleob Deangela Randleman MD [facility] pager [phone]   03/10/2018, 1:12 PM   Recent Labs  Lab 03/04/18 0804 03/06/18 0846 03/08/18 0911  03/10/18 0540  NA 132* 133* 135 135  K 5.1 4.4 4.6 4.7  CL 92* 94* 99* 95*  CO2 29 28 22 25   GLUCOSE 94 84 96 91  BUN 30* 21* 19 31*  CREATININE 8.54* 6.74* 6.75* 9.58*  CALCIUM 9.5 9.4 9.3 9.8  PHOS 3.4 2.5  --   --    Recent Labs  Lab 03/04/18 0804 03/06/18 0846  ALBUMIN 2.8* 2.7*   Recent Labs  Lab 03/04/18 0805 03/06/18 0846  WBC 7.2 5.8  HGB 9.5* 9.7*  HCT 29.5* 30.4*  MCV 100.0 99.3  PLT 133* 178   Iron/TIBC/Ferritin/ %Sat No results found for: IRON, TIBC, FERRITIN, IRONPCTSAT

## 2018-03-10 NOTE — Progress Notes (Signed)
Clinical Social Worker following patient/family for support and discharge need. CS[facility] that she spoke with family and family has decided on sending patient to hospice. Kas[facility] a [facility].  CSW contacted Chales AbrahamsMary Ann to make referral on behalf of family. Chales AbrahamsMary Ann stated she will reach out to family to do assessment. Chales AbrahamsMary Ann stated she will call CSW back once she has checked bed availability.   Marrianne MoodAshley Loreen Bankson, MSW,  [employer] [phone]

## 2018-03-10 NOTE — Progress Notes (Signed)
Daily Progress Note   Patient Name: Shane [name]       Date: 03/10/2018 DOB: [birth year]  Age: 77 y.o. MRN#: [id number] Attending Physician: Dessa Phi, DO Primary Care Physician: Leonides Sake, MD Admit Date: 02/23/2018  Reason for Consultation/Follow-up: Establishing goals of care  Subjective: Met again with patient's family. Spouse, Daughters- Sharyn Lull (and her husband) and Karna Christmas, son Ed (and his wife), were present. Relayed information that patient had continued confusion and agitation despite holding sedating medications. Reviewed with them my documented conversation with patient from 5/31.  Patient and family noted that patient had been stating his desire to stop dialysis for the last year.  They stated that they had decided to transition to comfort measures and seek Hospice placement.  Emotional support was given to them through very difficult decision making.  Reviewed with them [place] [facility] to support through dying process with symptom management.  Reviewed code status- patient's spouse and children agreed that DNR status would more align with GOC of supporting patient through peaceful natural dying process and understand this to mean that patient will not receive ruscuscitation including CPR and intubation.  Their main goal is for patient not to suffer in the face of stopping dialysis and facing end of life and dying process.   Review of Systems  Unable to perform ROS: Dementia    Length of Stay: 15  Current Medications: Scheduled Meds:  . ammonium lactate   Topical Daily  . calcitRIOL  1 mcg Oral Q M,W,F-HD  . cephALEXin  500 mg Oral Q12H  . [START ON 03/11/2018] Chlorhexidine Gluconate Cloth  6 each Topical Q0600  . heparin  5,000 Units Subcutaneous Q8H  . mouth  rinse  15 mL Mouth Rinse BID  . metoprolol succinate  12.5 mg Oral Daily  . pantoprazole  40 mg Oral Daily  . triamcinolone cream   Topical Daily    Continuous Infusions: . dextrose 30 mL/hr at 03/09/18 0708    PRN Meds: acetaminophen **OR** acetaminophen, albuterol, bisacodyl, dextrose, nicotine, ondansetron **OR** ondansetron (ZOFRAN) IV, senna-docusate  Physical Exam  Constitutional:  Frail, thin  Neurological:  Not oriented, speech unintelligible, lethargic  Skin: Skin is warm and dry.  Nursing note and vitals reviewed.           Vital Signs: BP 102/66 (BP  Location: Right Arm)   Pulse (!) 143   Temp 98.8 F (37.1 C) (Axillary)   Resp (!) 21   Ht 5' 5"  (1.651 m)   Wt 62.7 kg (138 lb 3.7 oz)   SpO2 92%   BMI 23.00 kg/m  SpO2: SpO2: 92 % O2 Device: O2 Device: Room Air O2 Flow Rate: O2 Flow Rate (L/min): 2 L/min  Intake/output summary:   Intake/Output Summary (Last 24 hours) at 03/10/2018 1403 Last data filed at 03/09/2018 1500 Gross per 24 hour  Intake 270 ml  Output -  Net 270 ml   LBM: Last BM Date: 03/09/18 Baseline Weight: Weight: (UTA, pt on ED stretcher, too unstable to stand) Most recent weight: Weight: 62.7 kg (138 lb 3.7 oz)       Palliative Assessment/Data: PPS: 30%    Flowsheet Rows     Most Recent Value  Intake Tab  Referral Department  Hospitalist  Unit at Time of Referral  Intermediate Care Unit  Palliative Care Primary Diagnosis  Nephrology  Date Notified  02/28/18  Palliative Care Type  New Palliative care  Reason for referral  Clarify Goals of Care, Psychosocial or Spiritual support  Date of Admission  02/23/18  Date first seen by Palliative Care  03/02/18  # of days Palliative referral response time  2 Day(s)  # of days IP prior to Palliative referral  5  Clinical Assessment  Palliative Performance Scale Score  30%  Pain Max last 24 hours  Not able to report  Pain Min Last 24 hours  Not able to report  Dyspnea Max Last 24 Hours   Not able to report  Dyspnea Min Last 24 hours  Not able to report  Nausea Max Last 24 Hours  Not able to report  Nausea Min Last 24 Hours  Not able to report  Anxiety Max Last 24 Hours  Not able to report  Anxiety Min Last 24 Hours  Not able to report  Other Max Last 24 Hours  Not able to report  Psychosocial & Spiritual Assessment  Palliative Care Outcomes  Patient/Family meeting held?  Yes  Who was at the meeting?  pt's wife and dtr, [place]  Provided psychosocial or spiritual support      Patient Active Problem List   Diagnosis Date Noted  . Advance care planning   . HCAP (healthcare-associated pneumonia) 03/05/2018  . Streptococcal bacteremia 03/05/2018  . Hypoglycemia   . ESRD on dialysis ([place])   . Palliative care encounter   . Palliative care by specialist   . SOB (shortness of breath)   . Hypoxia   . Acute delirium   . Dementia with behavioral disturbance   . GERD (gastroesophageal reflux disease) 01/25/2018  . Diarrhea 01/25/2018  . Abdominal pain 01/25/2018  . Acute respiratory failure with hypoxia ([place]) 01/25/2018  . Thrombocytopenia ([place]) 01/25/2018  . Atrial fibrillation, chronic ([place])   . Atrial fibrillation ([place]) 06/01/2017  . A-fib ([place]) 06/01/2017  . Atrial fibrillation with RVR ([place]) 06/29/2016  . Adjustment disorder with other symptom   . Toxic metabolic encephalopathy 32/95/1884  . Goals of care, counseling/discussion 11/25/2015  . Bilateral inguinal hernia (BIH) s/p lap repair w mesh 11/25/2015 11/25/2015  . Hydrocele s/p partial rexction/unroofing 11/25/2015 11/25/2015  . 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 Pulmonary hypertension ([place]) 08/04/2015  . Essential hypertension   . Acute encephalopathy 04/12/2015  . ESRD needing dialysis ([place]) 04/12/2015  . Anorexia 04/12/2015  .  Amyloidosis ([place]) 04/12/2015  . Uremia 04/12/2015  . Tobacco abuse 02/09/2013  . 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 Patient had missed 2 dialysis appointments and presented short of breath. He was dialyzed on 03/01/2018 while inpatient. Has developed strep bacteremia during admission. He was previously admitted in April as well for missing dialysis appointments. During this admission he has been combative during dialysis and nephrology has said he is no longer a candidate for dialysis due to needing haldol in order to be dialyzed. Patient has stated to medical providers and his family that he did not want to continue to go to dialysis. Consult ordered for goals of care.   Assessment/Recommendations/Plan   DNR  Transition to comfort measures  Referral for [facility] placement    Goals of Care and Additional Recommendations:  Limitations on Scope of Treatment: Full Comfort Care and No Diagnostics  Code Status:  DNR  Prognosis:   < 2 weeks  Discharge Planning:  Hospice facility  Care plan was discussed with patient's spouse, children, and RN. Thank you for allowing the Palliative Medicine Team to assist in the care of this patient.   Time In: 1330 Time Out: 1430 Total Time 60 mins Prolonged Time Billed yes      Greater than 50%  of this time was spent counseling and coordinating care related to the above assessment and plan.  Mariana Kaufman, AGNP-C Palliative Medicine   Please contact Palliative Medicine Team phone at [phone] for questions and concerns.

## 2018-03-10 NOTE — Progress Notes (Signed)
[facility] RN note.  Received request from Marrianne MoodAshley Woods, CSW for [facility] request for [facility] on 03/11/2018. Chart reviewed. Spoke with daughter, Marcelino DusterMichelle to confirm interest and explain services. Family agreeable to transfer tomorrow (6/3). CSW aware. Registration paperwork to be completed 03/11/2018 morning. Please fax discharge summary to [phone]. RN please call report to [phone]. Please arrange transport for patient to arrive as soon as possible after the [facility] is [facility].   Please call with any hospice related questions or concerns.  Thank you,  Elsie SaasMary Anne Robertson, RN, [facility]  [facility]iaison [phone]  [facility] liaisons are on AMION.

## 2018-03-11 LAB — CULTURE, BLOOD (ROUTINE X 2)
CULTURE: NO GROWTH
Culture: NO GROWTH
Special Requests: ADEQUATE

## 2018-03-11 NOTE — Progress Notes (Signed)
PROGRESS NOTE  Patient well known to me, over the weekend with no improvement in clinical status, plan made to pursue comfort measures only, disposition to residential hospice scheduled for today.   Patient examined this morning, confused. Helped get legs back into bed. Plan to continue with comfort measures. Please see DC summary from yesterday evening for full information.   Tyrone Nineyan B Yordy Matton, MD 03/11/2018, 10:08 AM

## 2018-03-11 NOTE — Progress Notes (Signed)
Clinical Social Worker facilitated patient discharge including contacting patient family and facility to confirm patient discharge plans.  Clinical information faxed to facility and family agreeable with plan.  CSW arranged ambulance transport via [facility] to [facility] .  RN to call [phone] report prior to discharge.  Clinical Social Worker will sign off for now as social work intervention is no longer needed. Please consult us again if new need arises.  Milley Vining, MSW, LCSWA [phone]  

## 2018-03-11 NOTE — Care Management Important Message (Signed)
Important Message  Patient Details  Name: Shane [name] MRN: [id number] Date of Birth: [birth year]   Medicare Important Message Given:  Yes    Styles Fambro P Myonna Chisom 03/11/2018, 3:38 PM

## 2018-03-11 NOTE — Progress Notes (Signed)
[facility] RN note.  Received request from Marrianne MoodAshley Woods, CSW for [facility] on 03/11/2018. Chart reviewed. Spoke with daughter, Marcelino DusterMichelle to confirm interest and explain services. Family agreeable to transfer tomorrow today. CSW aware. Dr. Kern Reaponald Hertweck to assume care per family request.  Registration paperwork completed. Please fax discharge summary to [phone]. RN please call report to [phone]. Please arrange transport for patient to arrive as soon as possible.   Please call with any hospice related questions or concerns.  Thank you,  Elsie SaasMary Anne Robertson, RN, [facility]  [facility]iaison [phone]  [facility] liaisons are on AMION.

## 2018-03-11 NOTE — Progress Notes (Signed)
[facility]  [facility]  IV's to R arm discontinued.  Pt refusing vital signs to be taken-combative with attempts.  Family aware of pt departure.

## 2018-03-11 NOTE — Progress Notes (Signed)
Physical Therapy Discharge Patient Details Name: Shane [name] MRN: [id number] DOB: [birth year] Today's Date: 03/11/2018 Time:  -     Patient discharged from PT services secondary to Pt for comfort care and transfer to residential hospice..  Please see latest therapy progress note for current level of functioning and progress toward goals.    Progress and discharge plan discussed with patient and/or caregiver: Patient unable to participate in discharge planning and no caregivers available  GP     Shane [name] 03/11/2018, 10:38 AM  Skip Mayerary Zasha Belleau PT [phone]

## 2018-04-08 DEATH — deceased

## 2019-02-04 ENCOUNTER — Other Ambulatory Visit: Payer: Self-pay | Admitting: *Deleted

## 2019-10-02 IMAGING — CT CT ABD-PELV W/O CM
2 of 4 series · 16 of 46 positions shown, 18 images · non-contrast
Comparison: None.

CLINICAL DATA: Abdominal pain and diarrhea for several weeks

EXAM:
CT ABDOMEN AND PELVIS WITHOUT CONTRAST
TECHNIQUE: Multidetector CT imaging of the abdomen and pelvis was performed
following the standard protocol without IV contrast.

[Series 3: ap without · axial · non-contrast · 0.64mm/px · z∈[+847,+1232]mm · 13 of 89 slices shown, 15 images]
[im 6/89  soft-tissue]
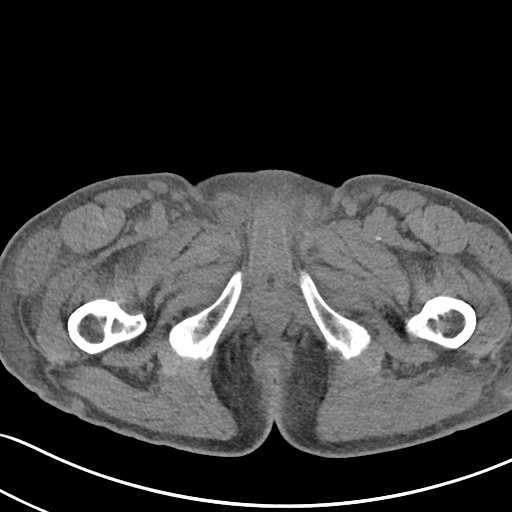
[im 6/89  bone]
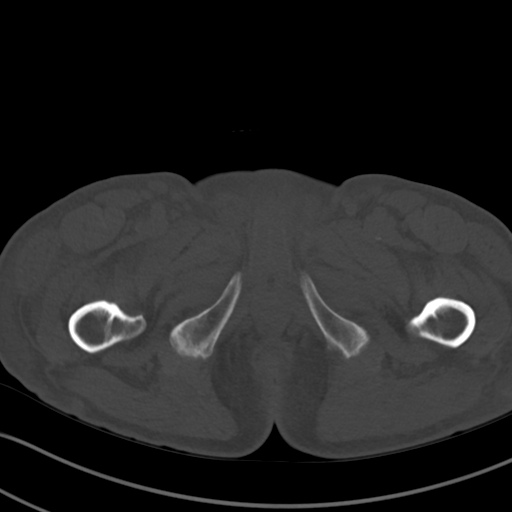
[im 11/89  soft-tissue]
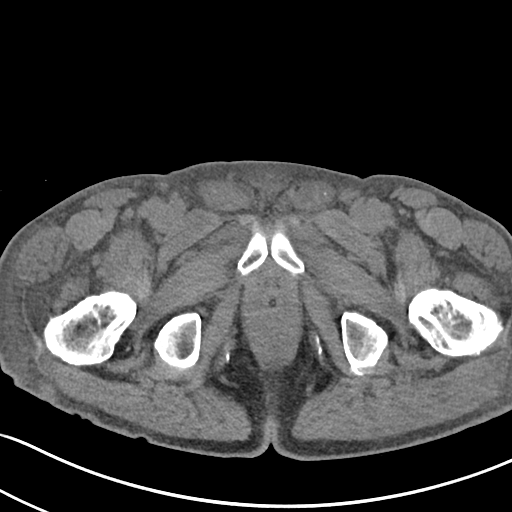
[im 21/89  soft-tissue]
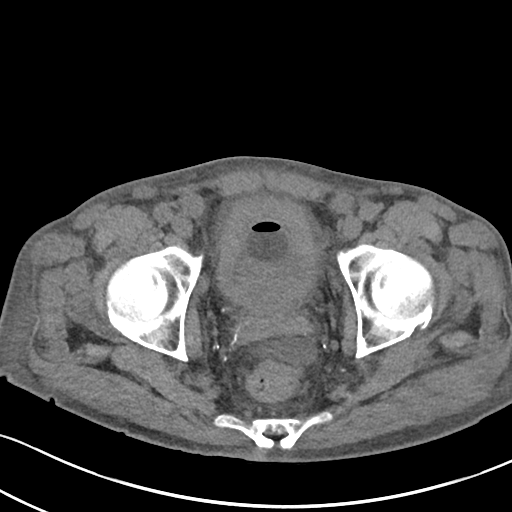
[im 26/89  soft-tissue]
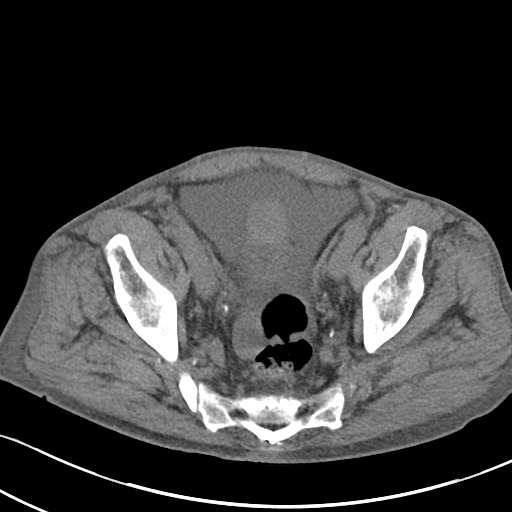
[im 32/89  soft-tissue]
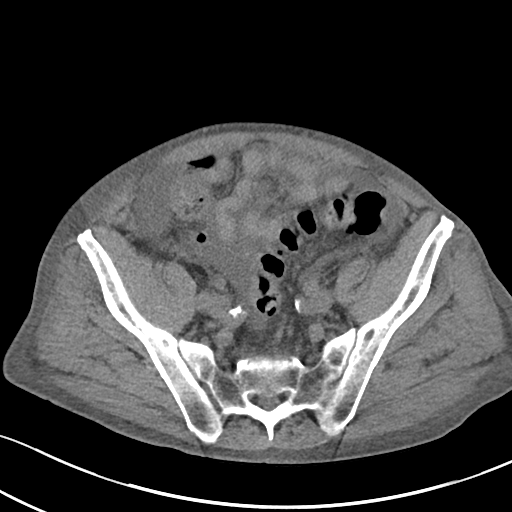
[im 37/89  soft-tissue]
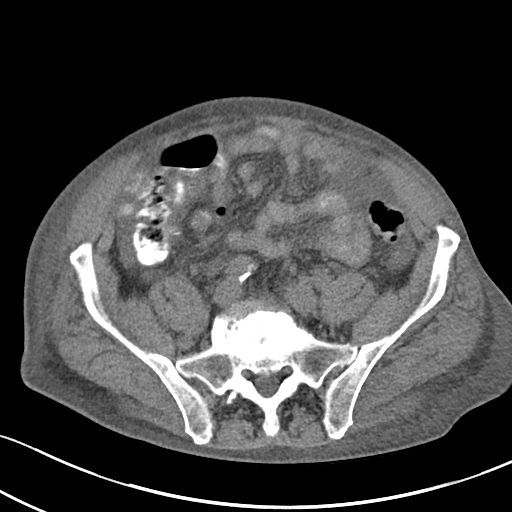
[im 47/89  soft-tissue]
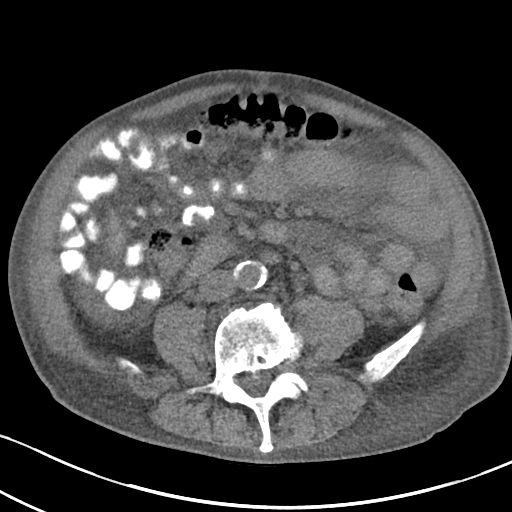
[im 52/89  soft-tissue]
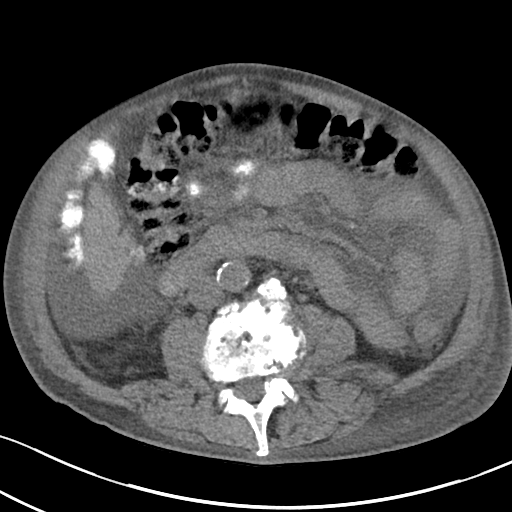
[im 57/89  soft-tissue]
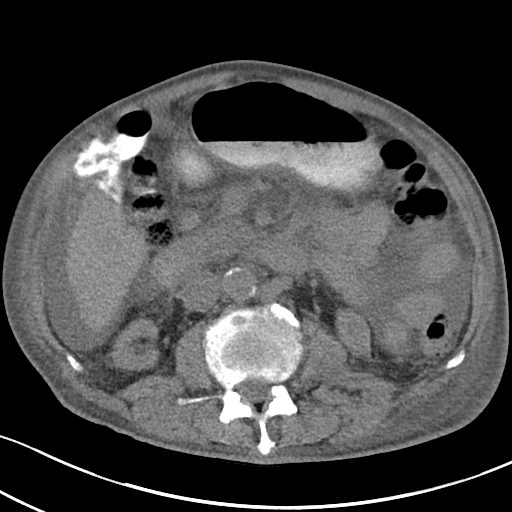
[im 57/89  bone]
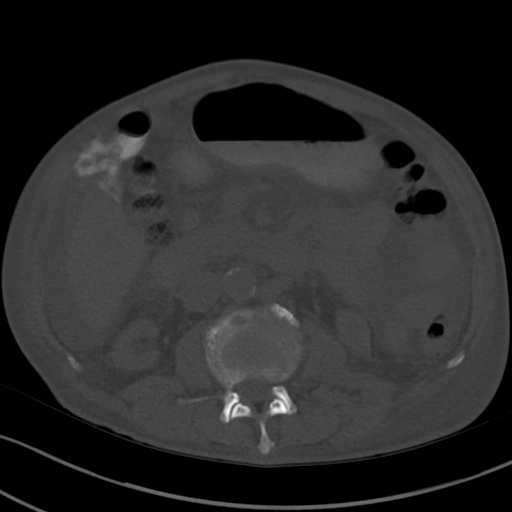
[im 63/89  soft-tissue]
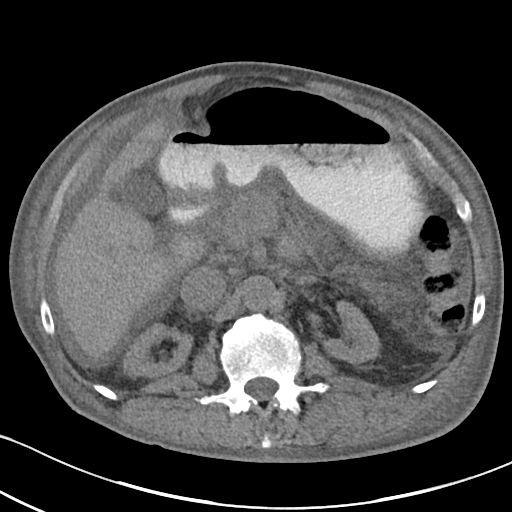
[im 68/89  soft-tissue]
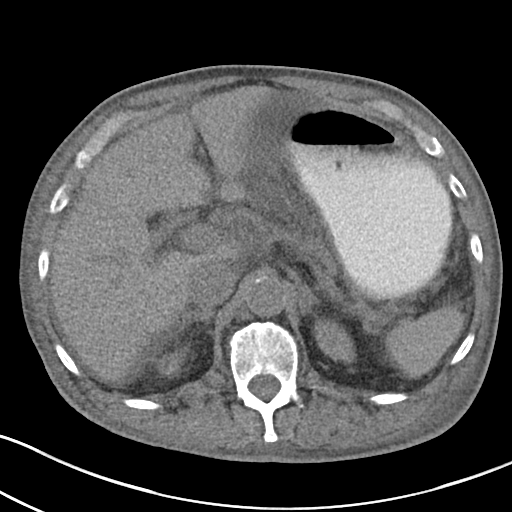
[im 78/89  soft-tissue]
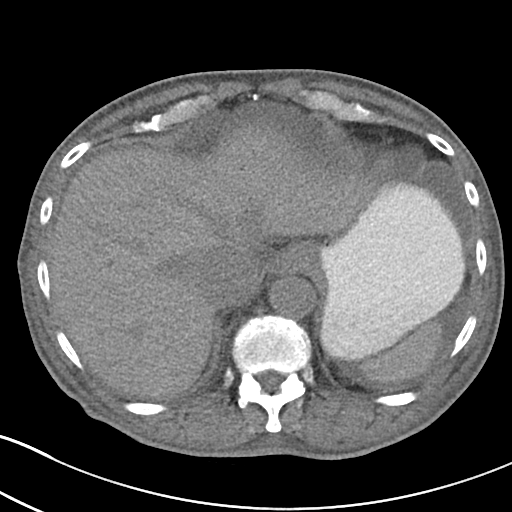
[im 83/89  soft-tissue]
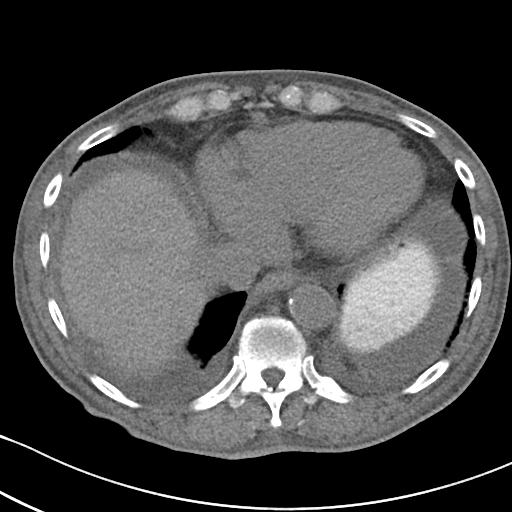

[Series 6: cor · coronal · 0.64mm/px · 3 of 83 slices shown]
[im 28/83  soft-tissue]
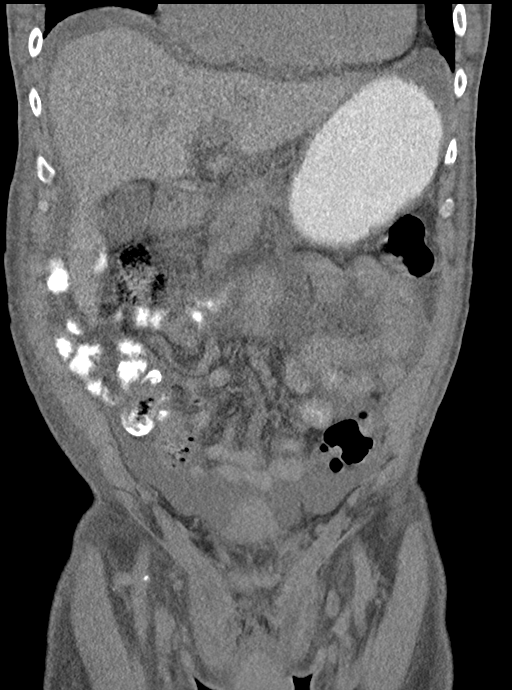
[im 37/83  soft-tissue]
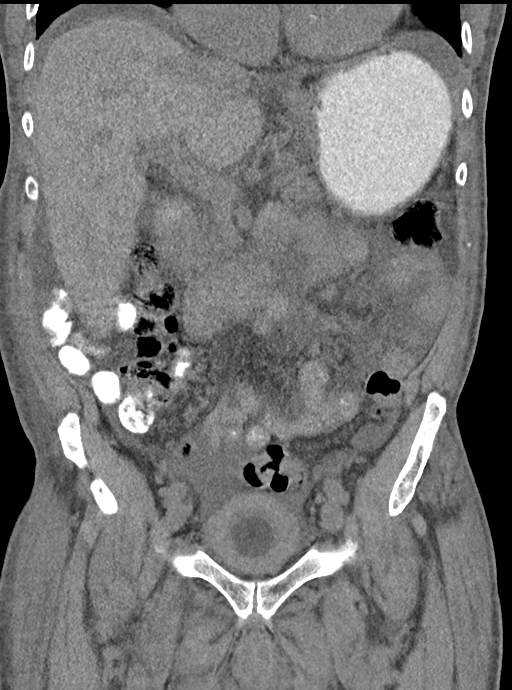
[im 46/83  soft-tissue]
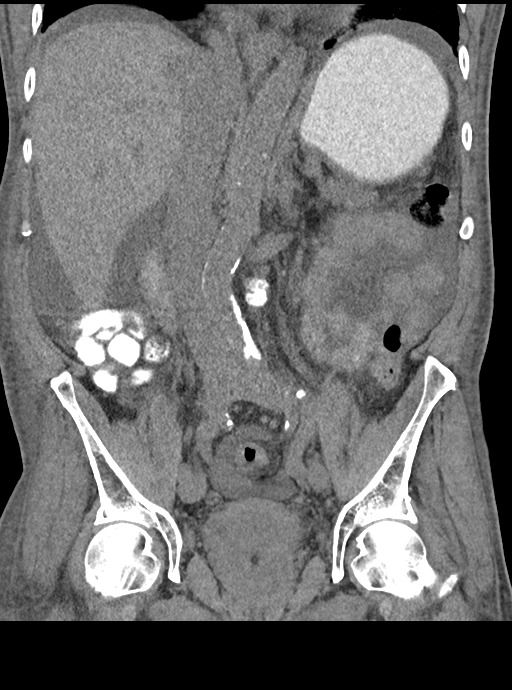

[16 of 46 positions shown; findings below may reference images not displayed]

FINDINGS: Lower chest: Small bilateral pleural effusions are noted with mild
bibasilar atelectasis. This is new from the prior exam.

Hepatobiliary: The liver and gallbladder appear within normal
limits.

Pancreas: Unremarkable. No pancreatic ductal dilatation or
surrounding inflammatory changes.

Spleen: Normal in size without focal abnormality.

Adrenals/Urinary Tract: Adrenal glands are unremarkable. The kidneys
are small and shrunken consistent with end-stage renal disease. The
bladder is decompressed by Foley catheter.

Stomach/Bowel: No obstructive or inflammatory changes of the bowel
are seen. The appendix appears within normal limits.

Vascular/Lymphatic: Aortic atherosclerosis. No enlarged abdominal or
pelvic lymph nodes.

Reproductive: Prostate is unremarkable.

Other: Mild diffuse ascites is identified. No large sizable pocket
to allow for safe paracentesis is seen.

Musculoskeletal: Degenerative changes of the lumbar spine are seen.
No acute bony abnormality is noted.
IMPRESSION: Small bilateral pleural effusions and bibasilar atelectasis.

Mild ascites.

Changes consistent with chronic renal disease.

## 2019-10-02 IMAGING — DX DG CHEST 2V
2 series · 2 of 2 positions shown · non-contrast
Comparison: Chest radiograph 10/12/2017

CLINICAL DATA: Shortness of breath and hypoxemia

EXAM:
CHEST - 2 VIEW

[chest lat]
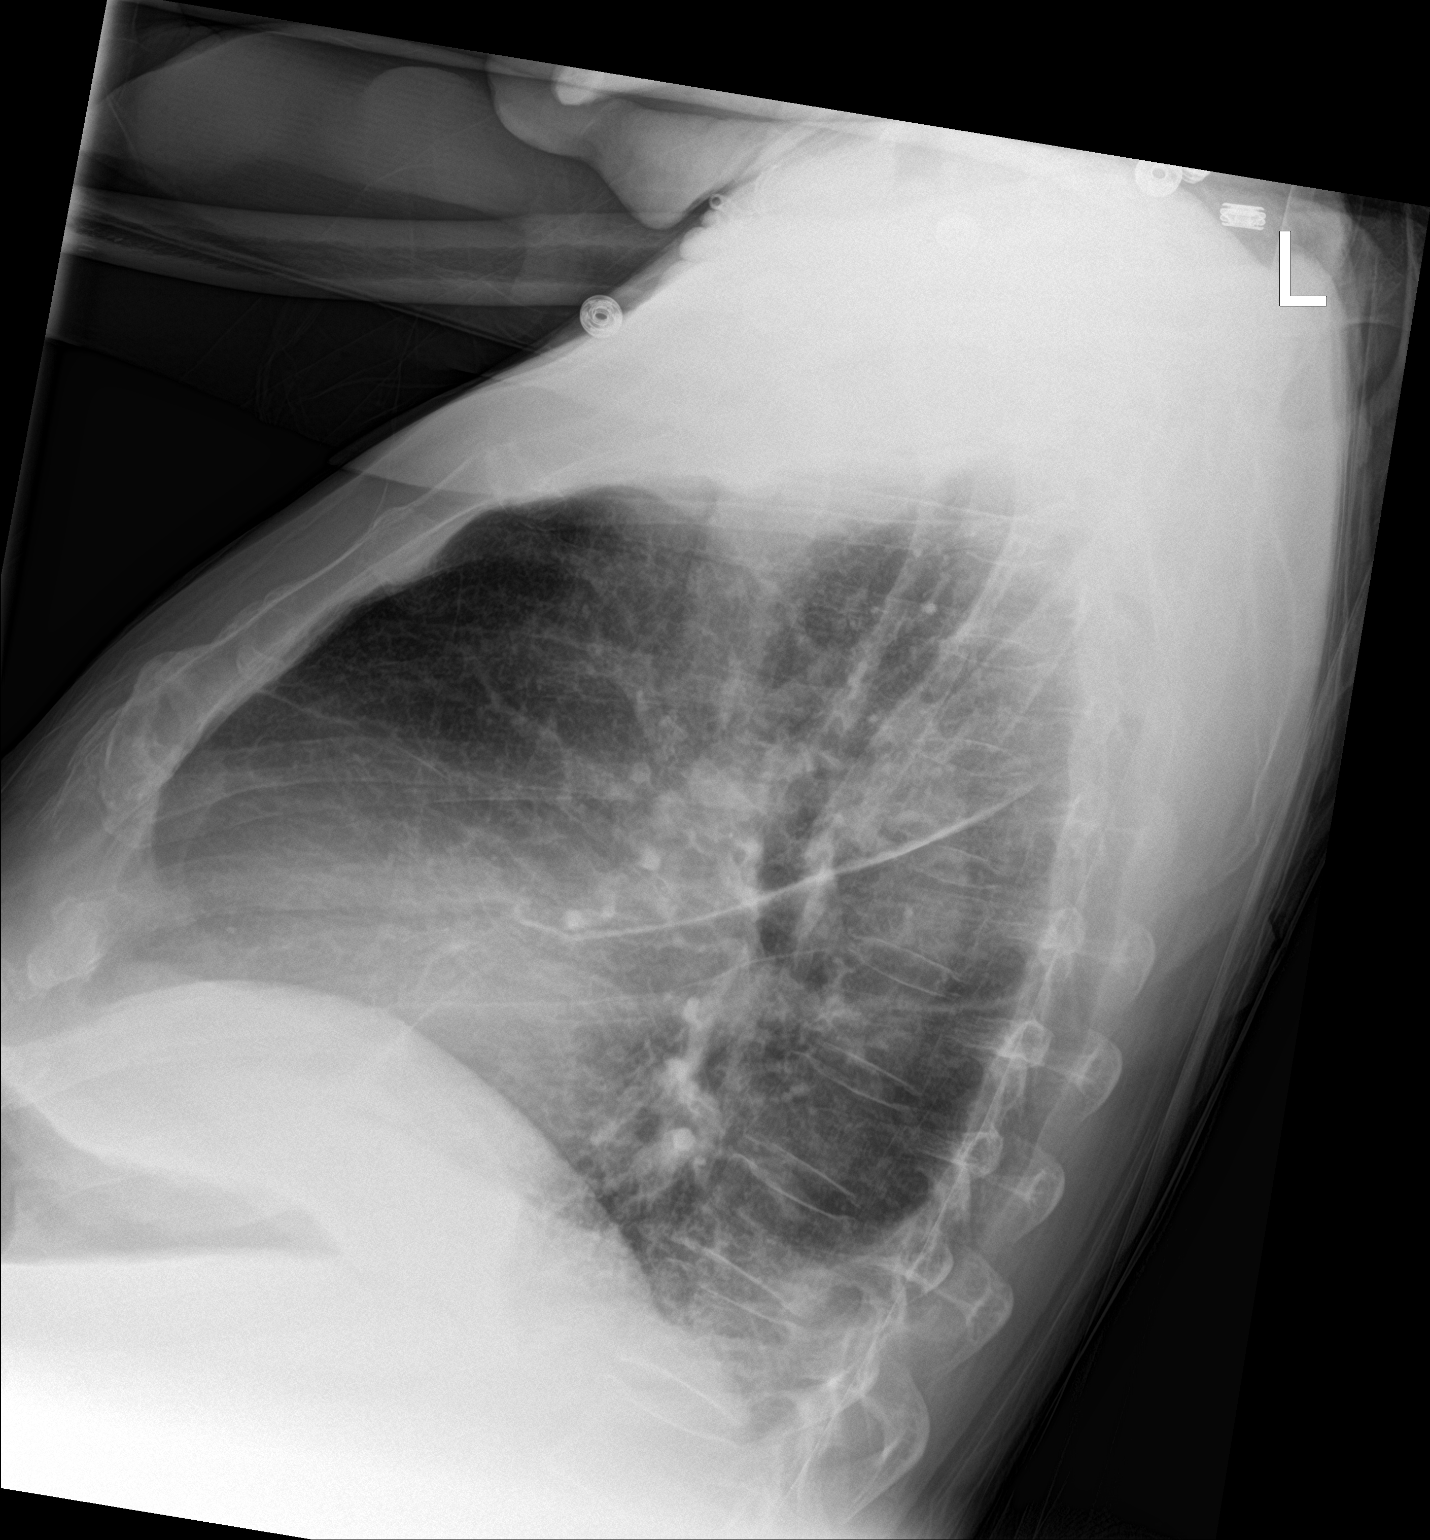

[chest ap]
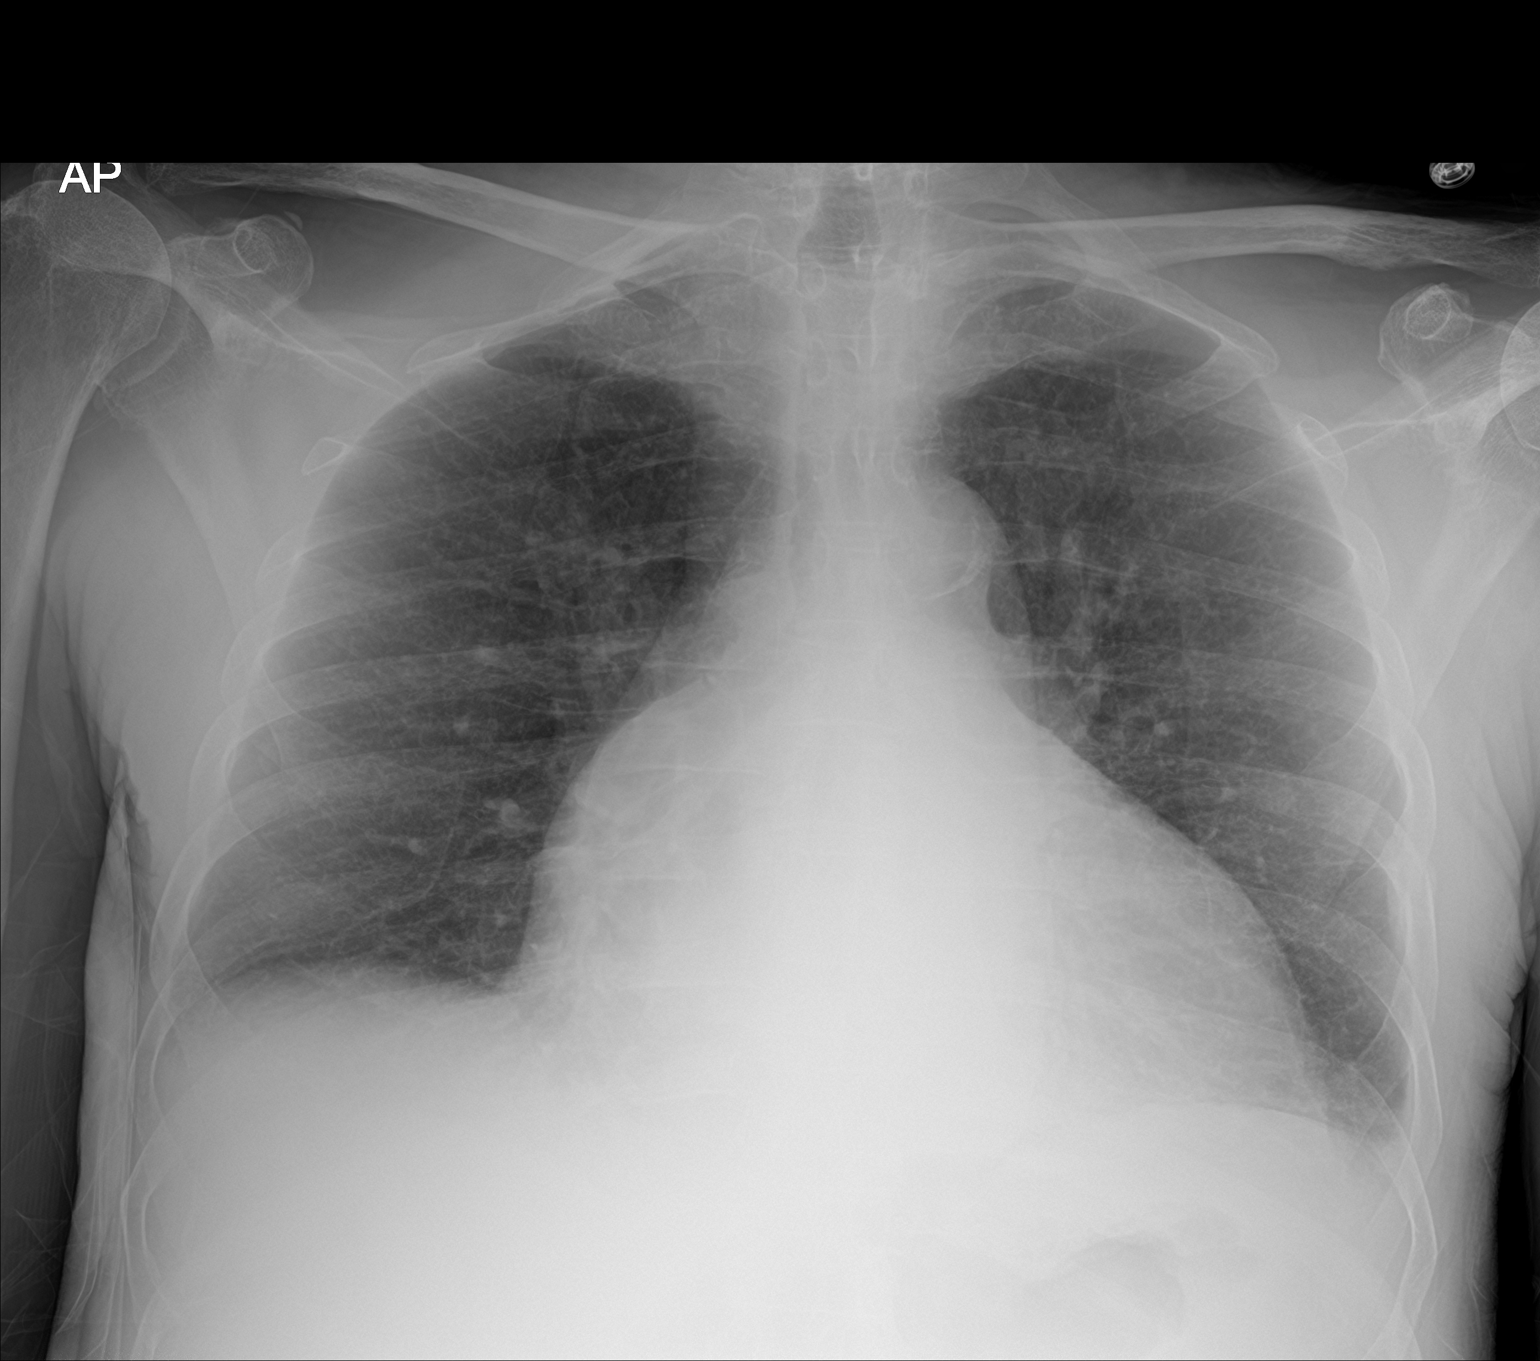

[2 of 2 positions shown; findings below may reference images not displayed]

FINDINGS: Cardiomegaly with calcific aortic atherosclerosis, unchanged. No
pulmonary edema or focal airspace consolidation. No pneumothorax or
sizable pleural effusion. Basilar opacities, likely atelectasis,
unchanged.
IMPRESSION: Cardiomegaly and calcific aortic atherosclerosis ([id number]).
Unchanged basilar opacities, likely atelectasis. No pulmonary edema.
